# Patient Record
Sex: Female | Born: 1935 | Race: White | Hispanic: No | Smoking: Former smoker
Health system: Southern US, Community
[De-identification: ages and names within clinical notes are randomized; demographics above are authoritative.]

## PROBLEM LIST (undated history)

## (undated) DIAGNOSIS — I10 Essential (primary) hypertension: Secondary | ICD-10-CM

## (undated) DIAGNOSIS — I499 Cardiac arrhythmia, unspecified: Secondary | ICD-10-CM

## (undated) DIAGNOSIS — I639 Cerebral infarction, unspecified: Secondary | ICD-10-CM

## (undated) DIAGNOSIS — M549 Dorsalgia, unspecified: Secondary | ICD-10-CM

## (undated) DIAGNOSIS — E039 Hypothyroidism, unspecified: Secondary | ICD-10-CM

## (undated) DIAGNOSIS — I4891 Unspecified atrial fibrillation: Secondary | ICD-10-CM

## (undated) HISTORY — PX: BREAST EXCISIONAL BIOPSY: SUR124

## (undated) HISTORY — PX: CATARACT EXTRACTION W/ INTRAOCULAR LENS  IMPLANT, BILATERAL: SHX1307

## (undated) HISTORY — PX: ABDOMINAL HYSTERECTOMY: SHX81

---

## 2004-06-05 ENCOUNTER — Ambulatory Visit: Payer: Self-pay | Admitting: Internal Medicine

## 2004-07-20 ENCOUNTER — Ambulatory Visit: Payer: Self-pay | Admitting: Gastroenterology

## 2004-07-20 IMAGING — CT CT ABD-PELV W/ CM
1 of 2 series · 16 of 32 positions shown, 20 images · non-contrast
Comparison: none

REASON FOR EXAM: elevated amylase
COMMENTS:

[Series 5: abdomen inspace · axial · 0.59mm/px · z∈[-941,-542]mm · 16 of 431 slices shown, 20 images]
[im 16/431  soft-tissue]
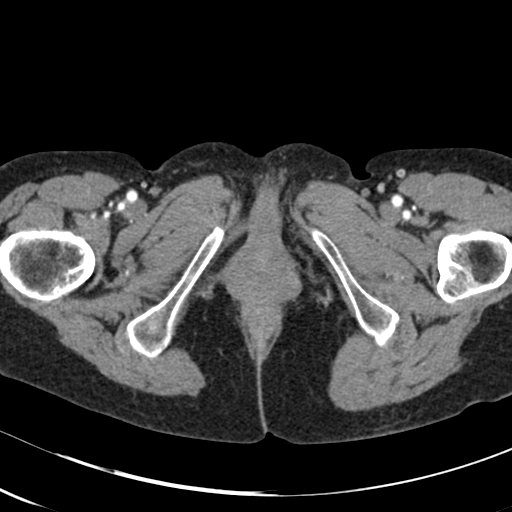
[im 16/431  bone]
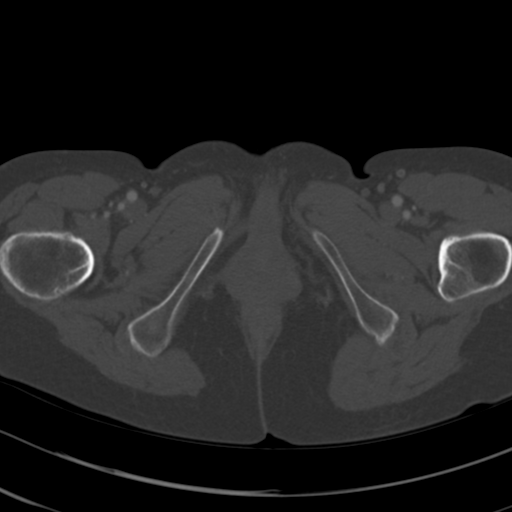
[im 47/431  soft-tissue]
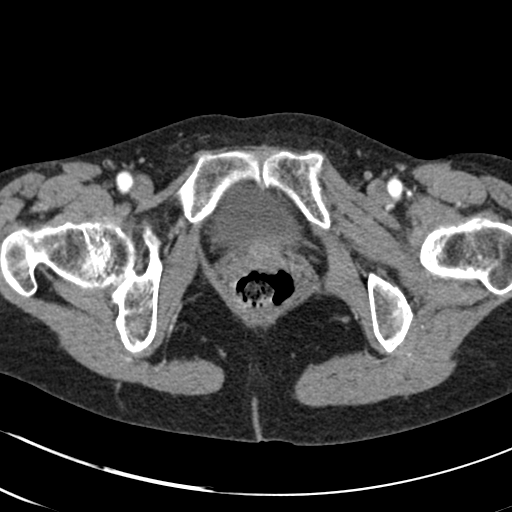
[im 77/431  soft-tissue]
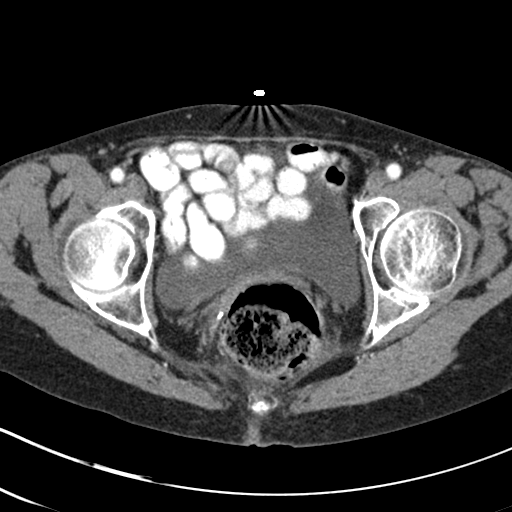
[im 108/431  soft-tissue]
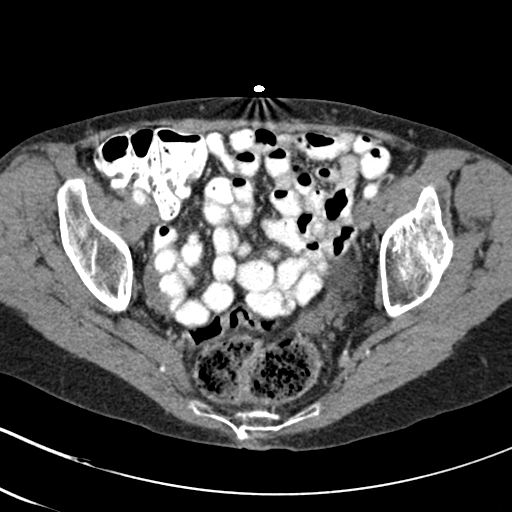
[im 139/431  soft-tissue]
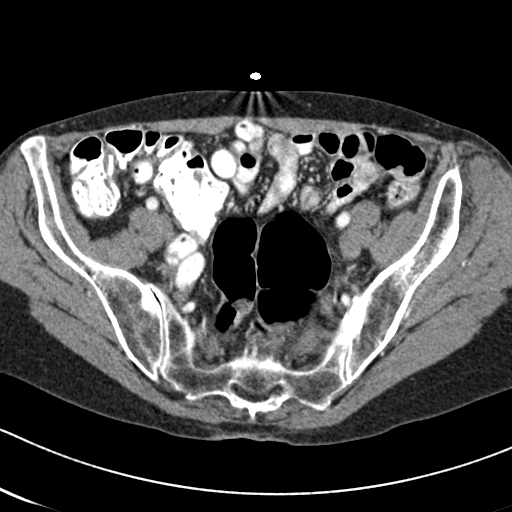
[im 169/431  soft-tissue]
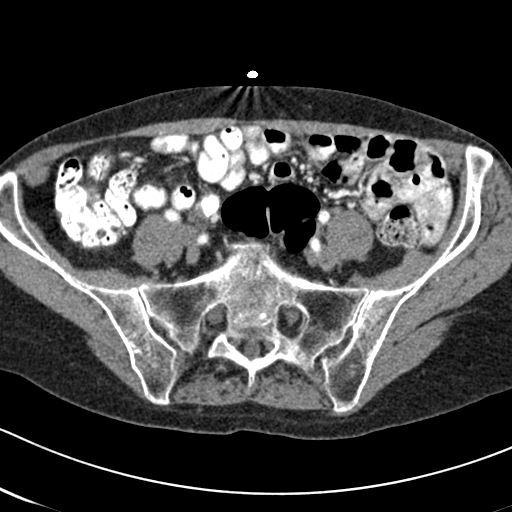
[im 200/431  soft-tissue]
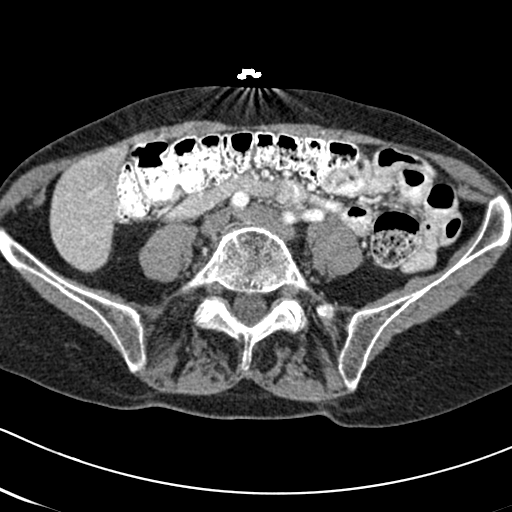
[im 231/431  soft-tissue]
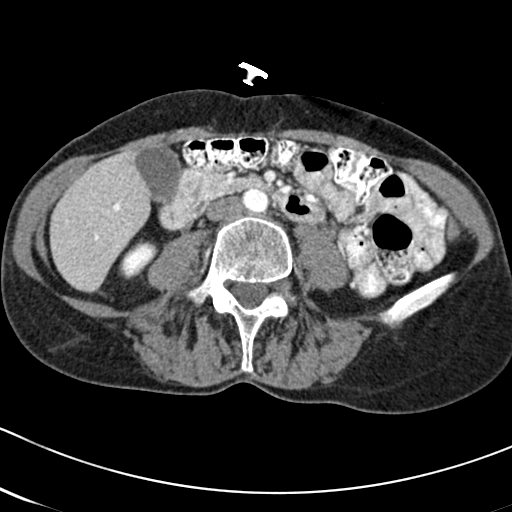
[im 262/431  soft-tissue]
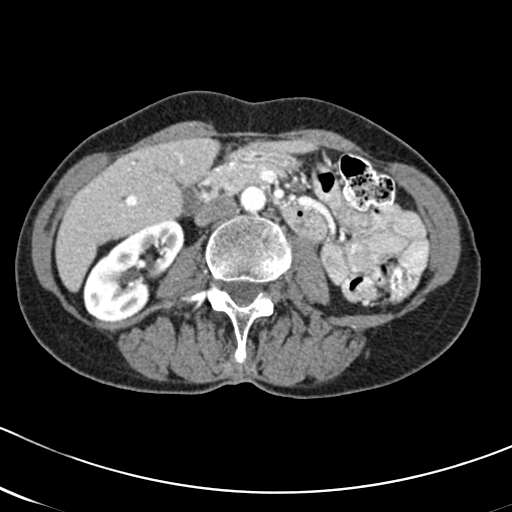
[im 262/431  bone]
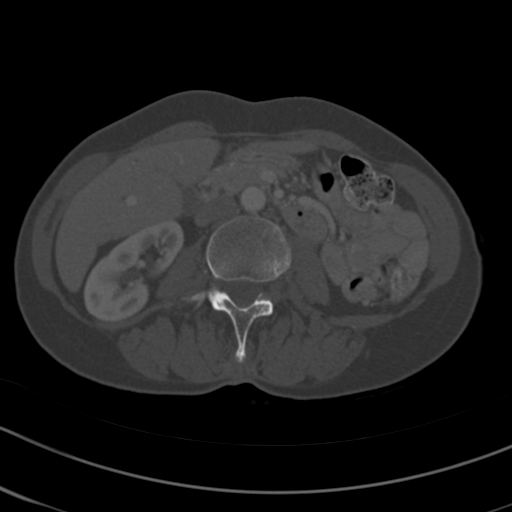
[im 292/431  soft-tissue]
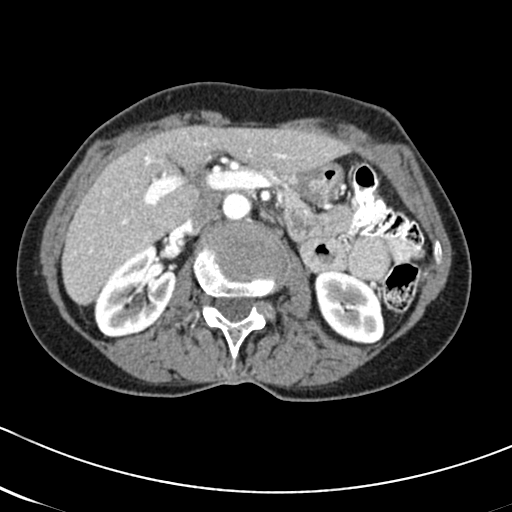
[im 323/431  soft-tissue]
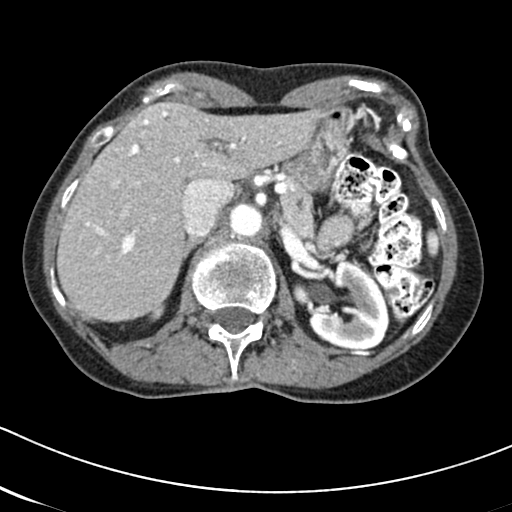
[im 354/431  soft-tissue]
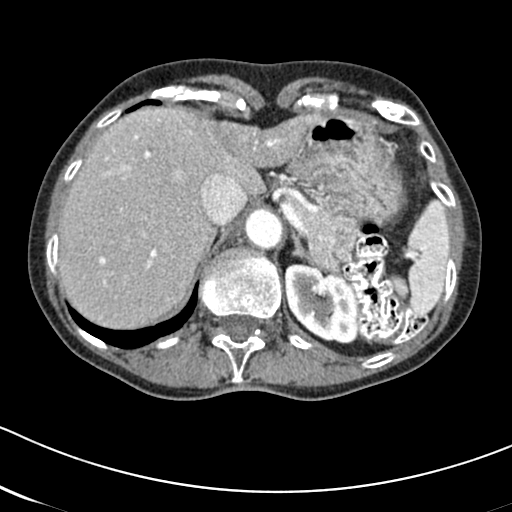
[im 369/431  lung]
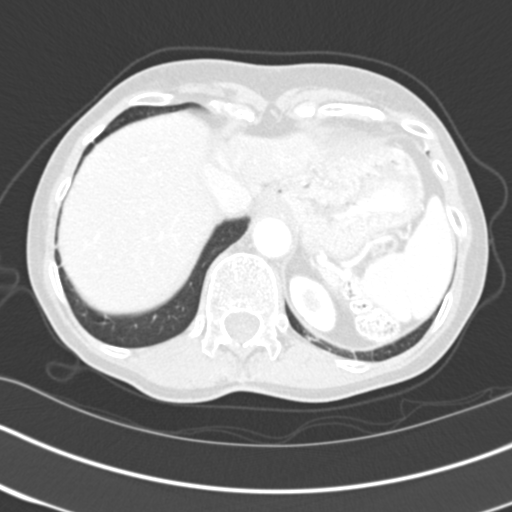
[im 384/431  soft-tissue]
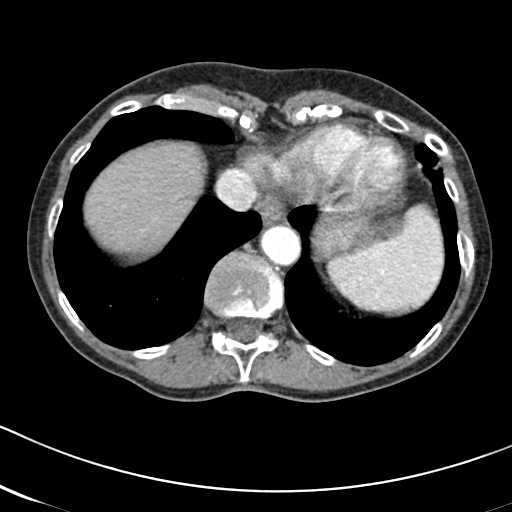
[im 384/431  lung]
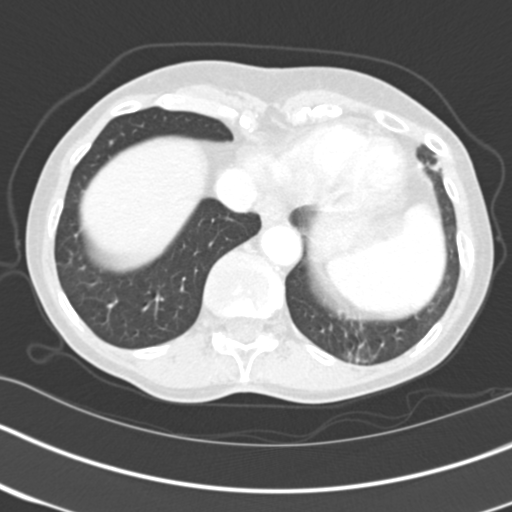
[im 400/431  lung]
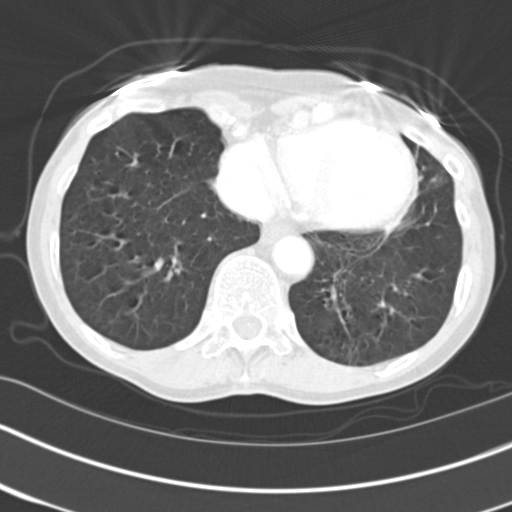
[im 415/431  soft-tissue]
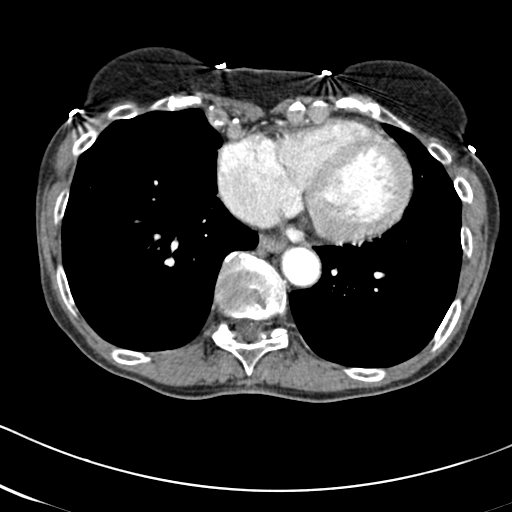
[im 415/431  lung]
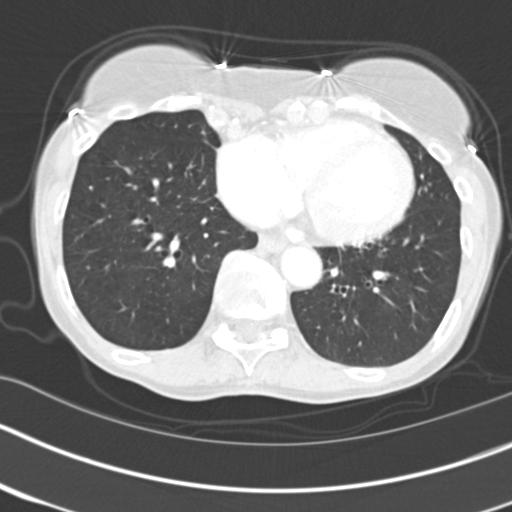

[16 of 32 positions shown; findings below may reference images not displayed]

PROCEDURE:     CT  - CT ABDOMEN / PELVIS  W  - [DATE] [DATE]

RESULT:       IV and oral contrast enhanced CT scan of the abdomen and
pelvis is performed in the standard fashion.  The patient has a prior study
dated [DATE] for comparison.  The lung bases are clear.  There appears to
be no definite pancreatic mass.  The pancreatic duct is seen but does not
appear to be significantly distended.  Sonographic correlation could be
obtained if desired.  No ascites is evident.  The urinary bladder is not
abnormally distended.  The bowel is nondistended and shows no evidence of
wall thickening or free air.  The kidneys enhance normally.  The liver,
spleen, and adrenal glands are unremarkable.  The abdominal aorta is normal
in caliber.
IMPRESSION: No definite CT evidence of pancreatitis.

## 2004-08-27 ENCOUNTER — Ambulatory Visit: Payer: Self-pay | Admitting: Gastroenterology

## 2005-07-08 ENCOUNTER — Ambulatory Visit: Payer: Self-pay | Admitting: Internal Medicine

## 2006-07-11 ENCOUNTER — Ambulatory Visit: Payer: Self-pay | Admitting: Internal Medicine

## 2007-07-14 ENCOUNTER — Ambulatory Visit: Payer: Self-pay | Admitting: Internal Medicine

## 2008-07-15 ENCOUNTER — Ambulatory Visit: Payer: Self-pay | Admitting: Internal Medicine

## 2008-12-10 ENCOUNTER — Ambulatory Visit: Payer: Self-pay | Admitting: Internal Medicine

## 2008-12-10 ENCOUNTER — Inpatient Hospital Stay: Payer: Self-pay | Admitting: Surgery

## 2008-12-10 IMAGING — US ABDOMEN ULTRASOUND
1 series · 17 of 25 positions shown · non-contrast
Comparison: none

REASON FOR EXAM: call report  [[email or web address]]  RUQ abd pain nausea vomiting
COMMENTS:

[Series 1: abdomen ultrasound · 17 of 73 slices shown]
[im 1/73]
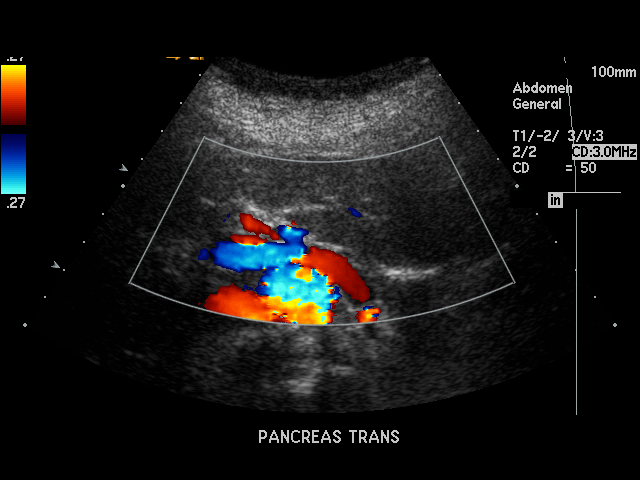
[im 7/73]
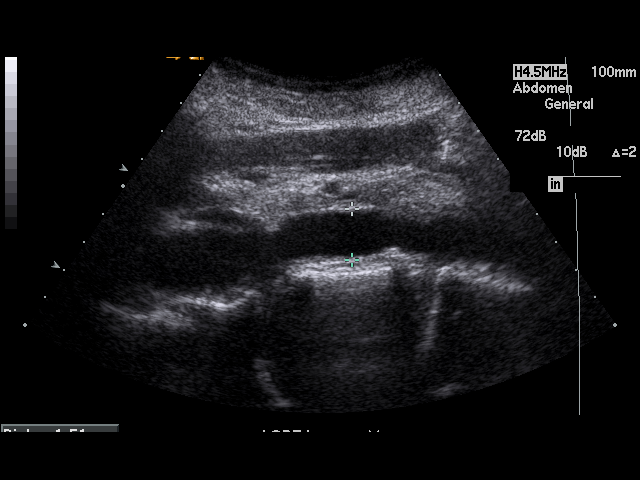
[im 10/73]
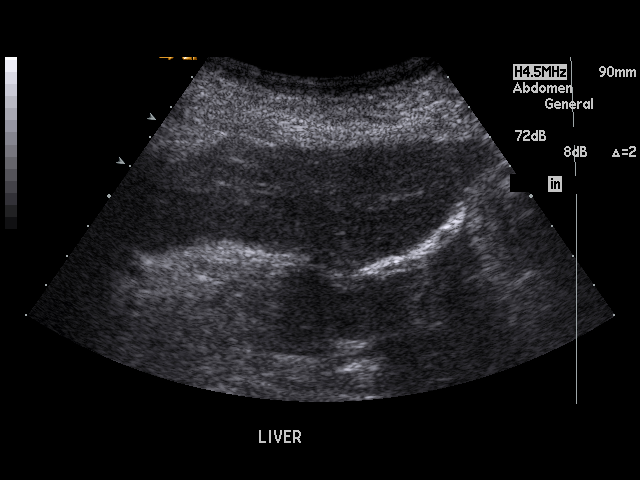
[im 16/73]
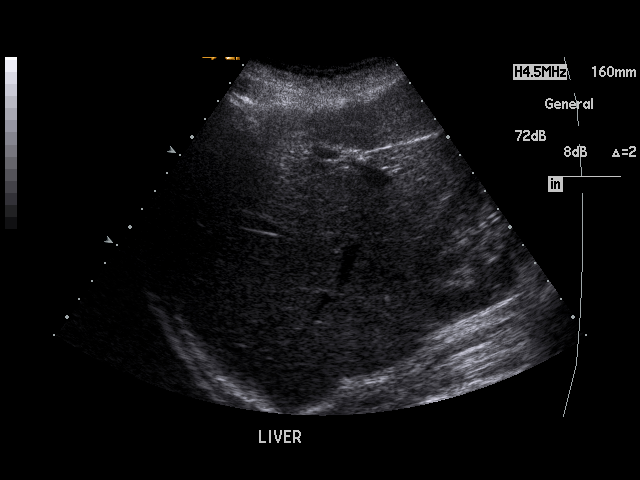
[im 19/73]
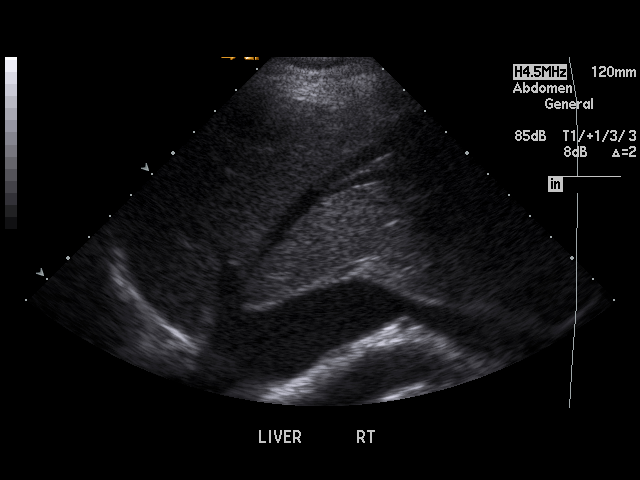
[im 25/73]
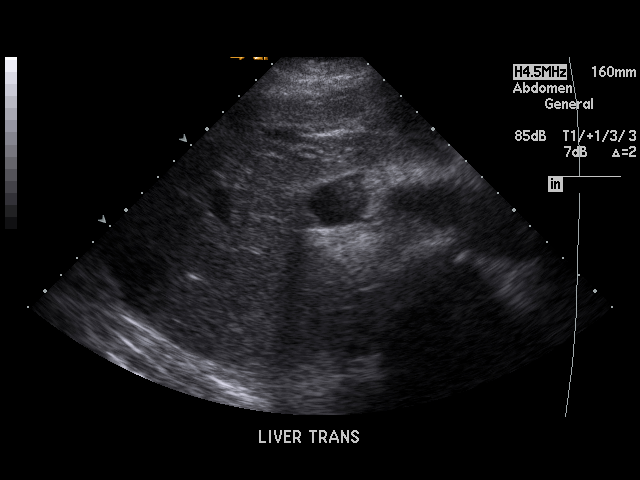
[im 28/73]
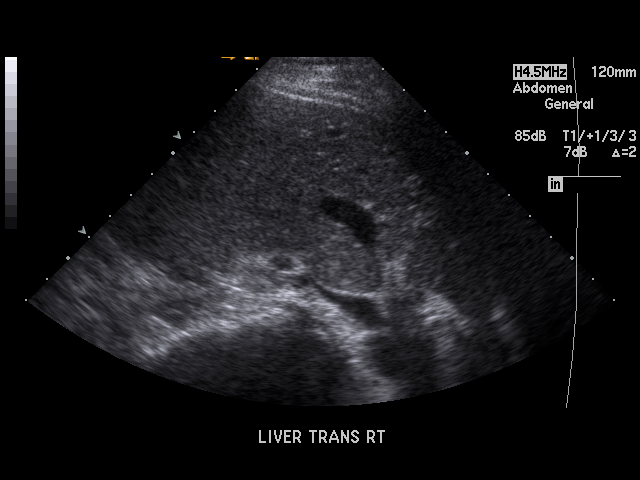
[im 34/73]
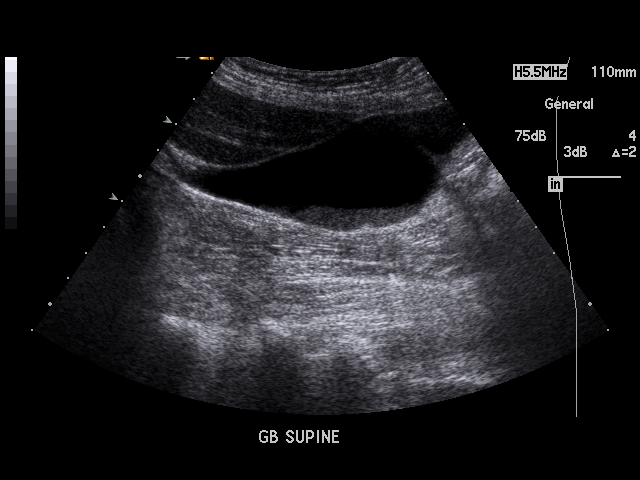
[im 37/73]
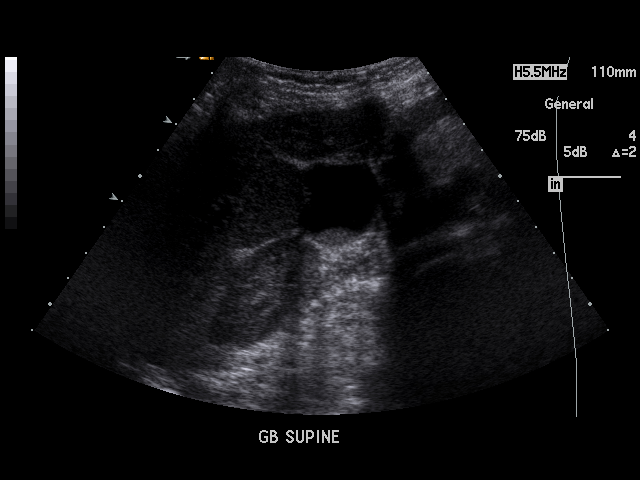
[im 40/73]
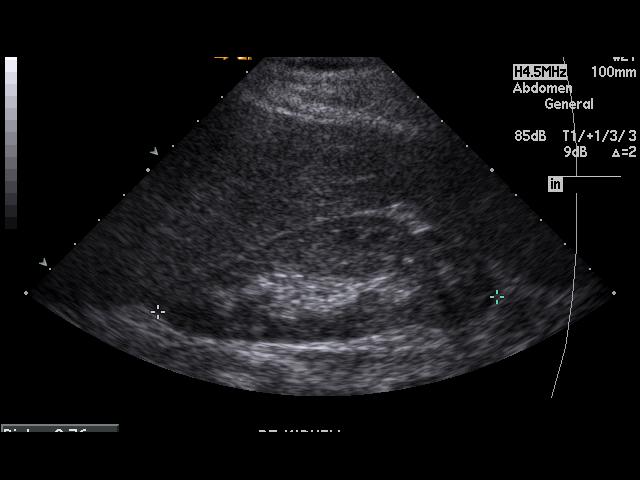
[im 46/73]
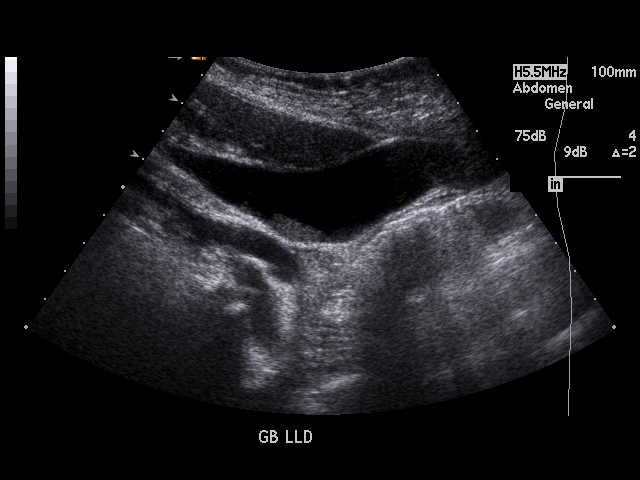
[im 49/73]
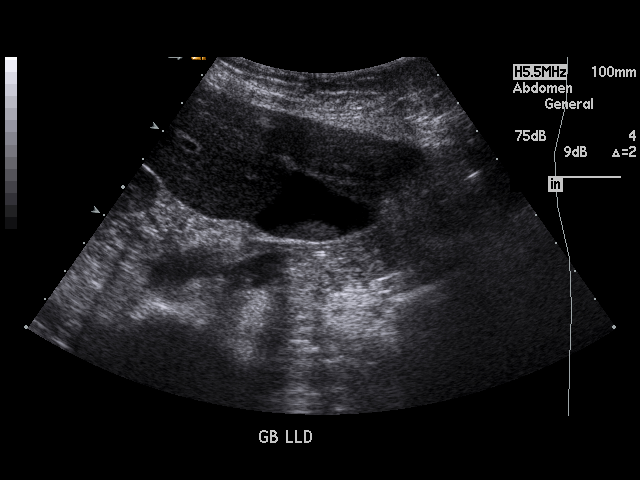
[im 55/73]
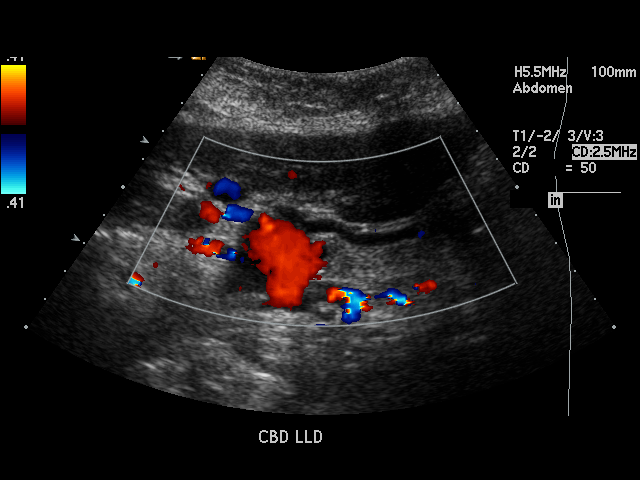
[im 58/73]
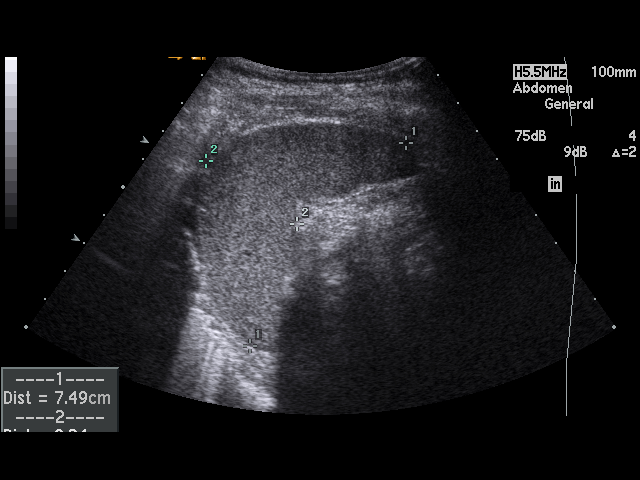
[im 64/73]
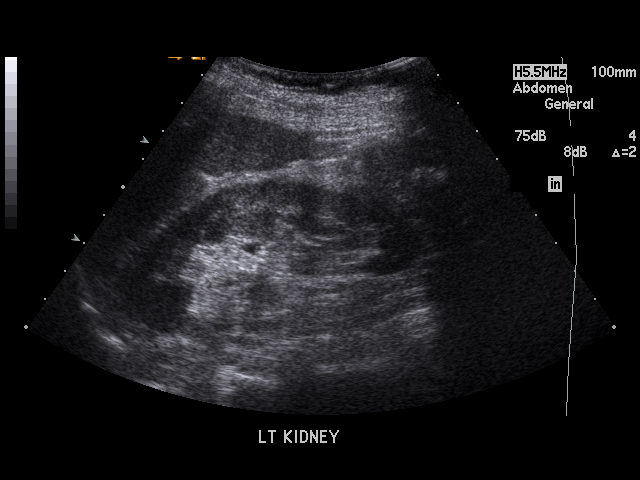
[im 67/73]
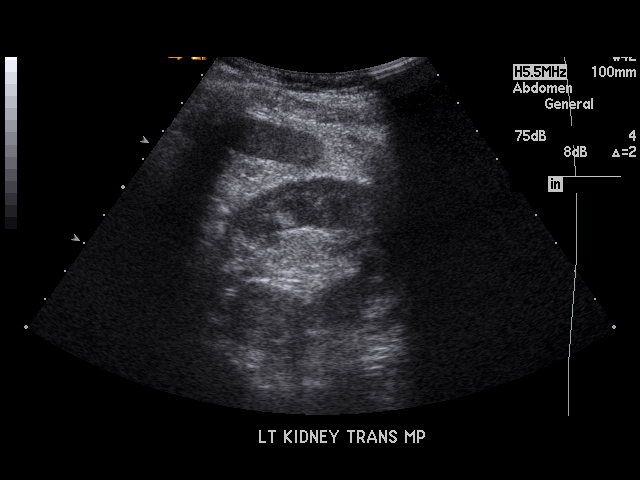
[im 73/73]
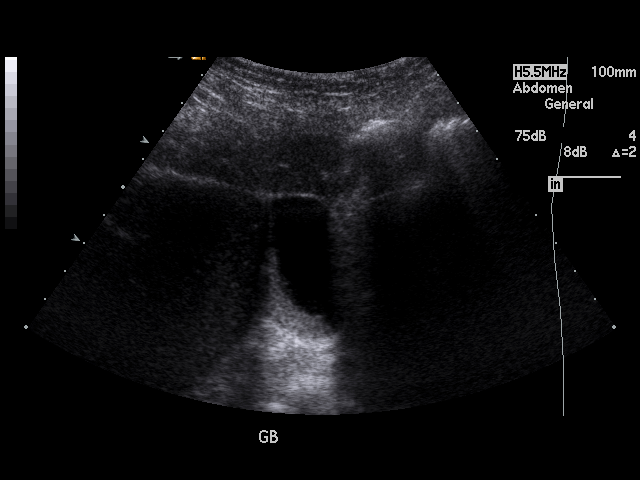

[17 of 25 positions shown; findings below may reference images not displayed]

PROCEDURE:     US  - US ABDOMEN GENERAL SURVEY  - [DATE]  [DATE]

RESULT:     The liver exhibits normal echotexture with no focal mass nor
ductal dilation. The gallbladder is adequately distended and contains
echogenic layering sludge. There is no sonographic Murphy's sign nor
evidence of gallbladder wall thickening. The common bile duct measures
mm in diameter.

Limited evaluation of the pancreas due to bowel gas revealed no suspicious
findings. The spleen is normal in size. The abdominal aorta is at the upper
limits of normal for caliber at 2.4 cm. The kidneys are normal in size and
echotexture.
IMPRESSION: 1. There is sludge within the gallbladder. I see no discrete stones and
there are no ultrasonic findings to suggest acute cholecystitis.
2. There is borderline dilation of the caliber of the abdominal aorta is
cm.
3. I see no acute abnormality elsewhere within the abdomen.

## 2008-12-10 IMAGING — CR DG ABDOMEN 3V
1 series · 4 of 4 positions shown · non-contrast
Comparison: none

REASON FOR EXAM: Abdominal pain
COMMENTS:

[Series 1: view not recorded · 0.17mm/px · 4 of 4 slices shown]
[im 1/4]
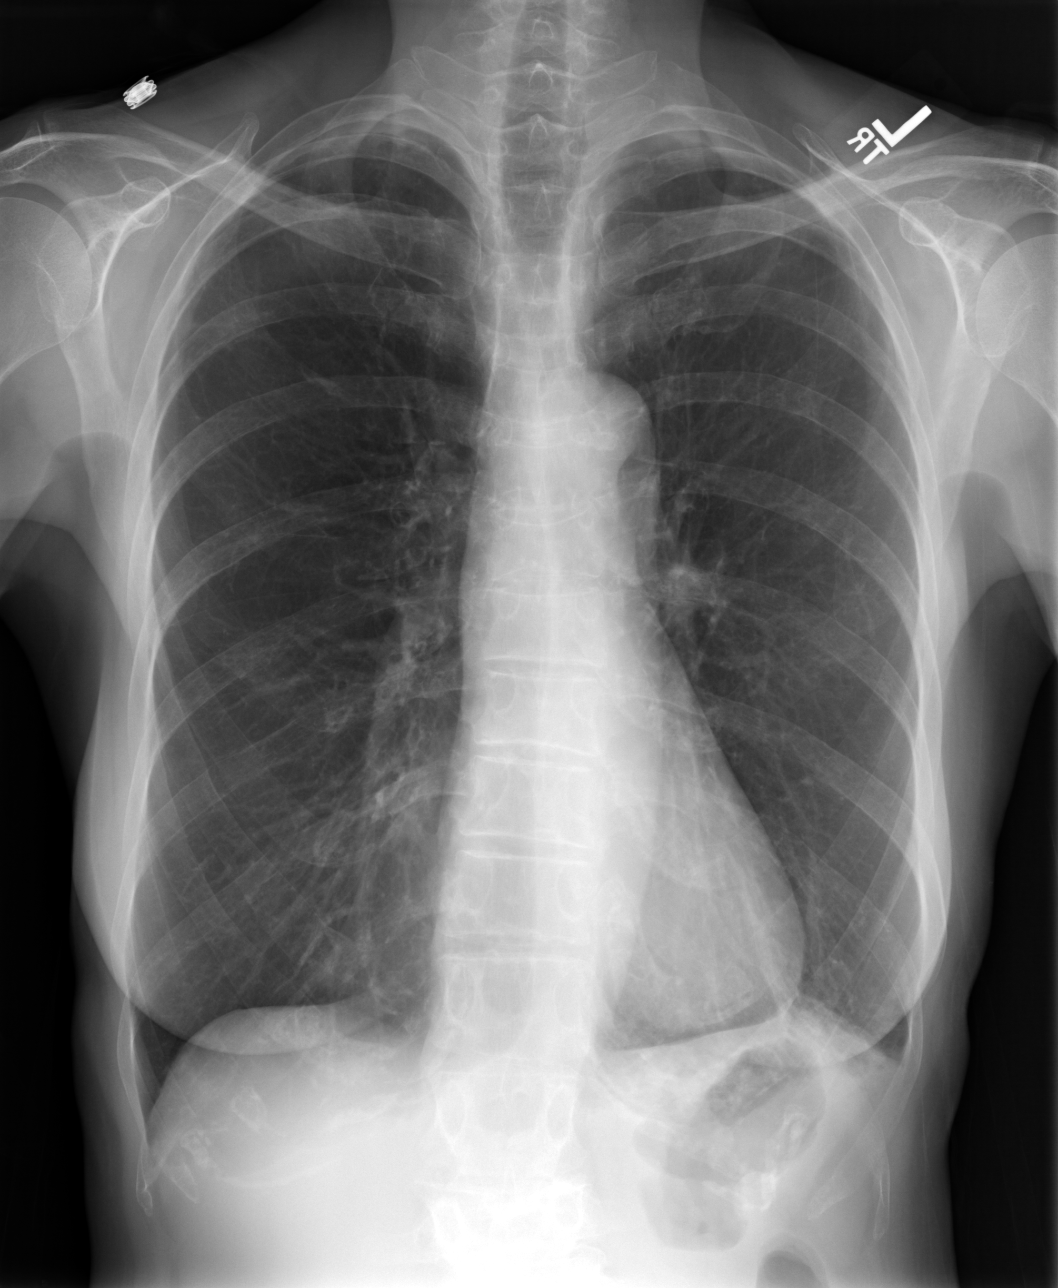
[im 2/4]
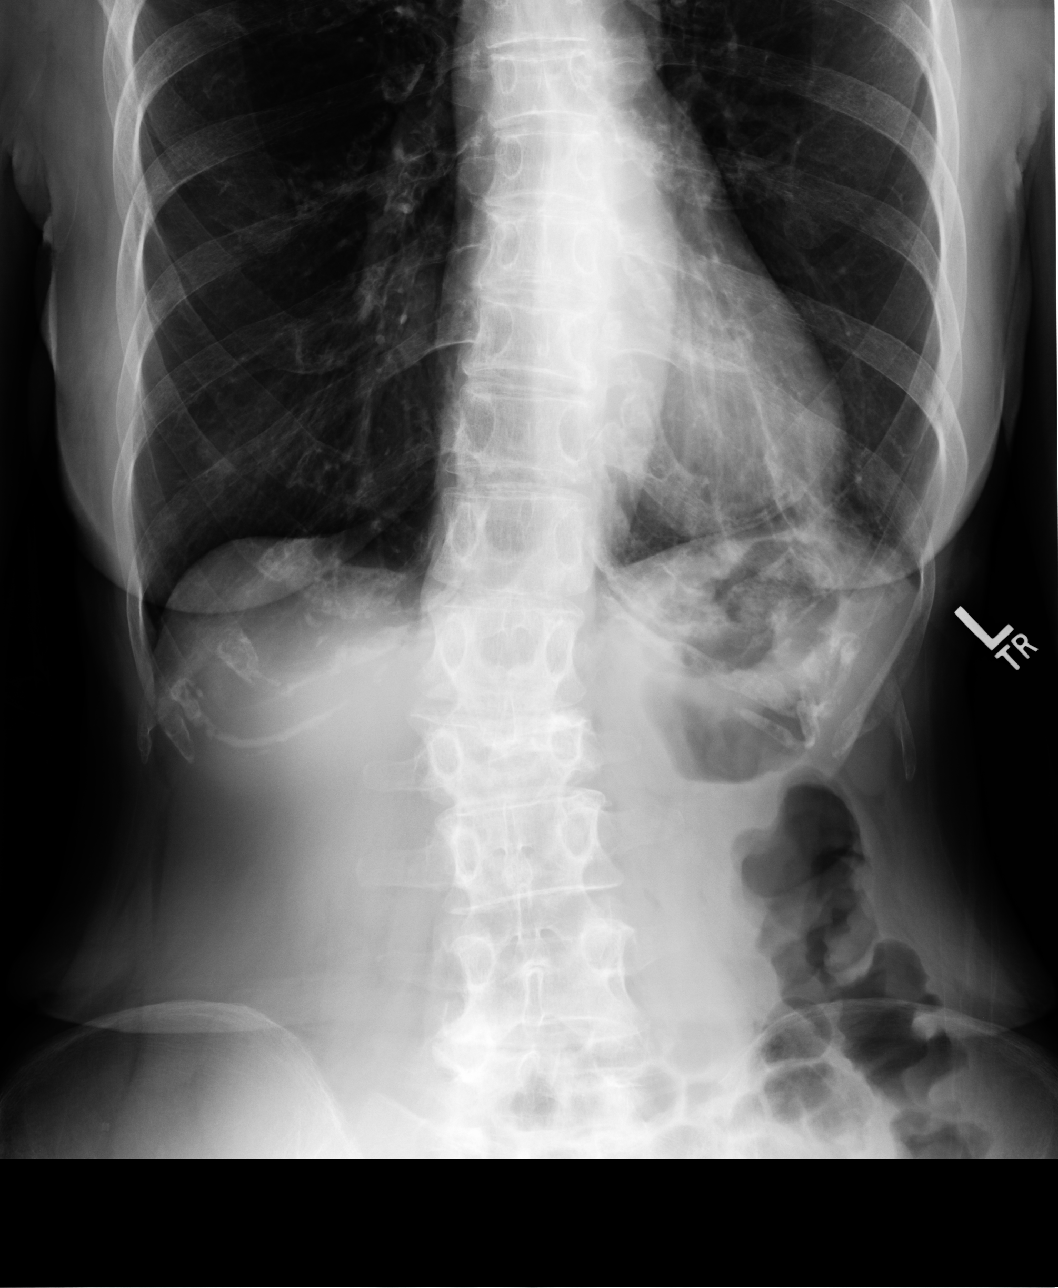
[im 3/4]
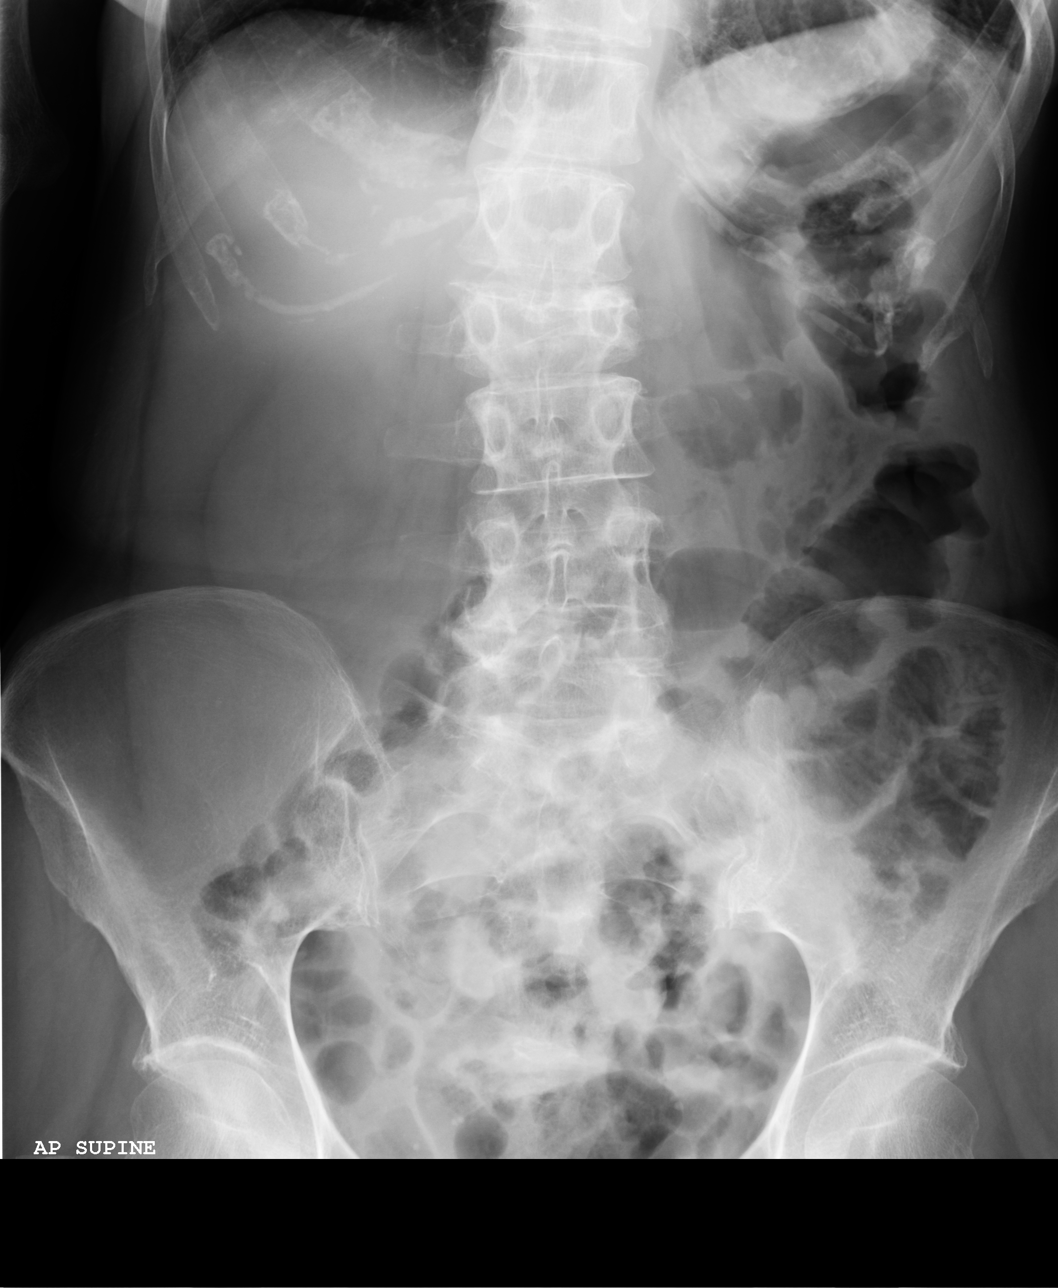
[im 4/4]
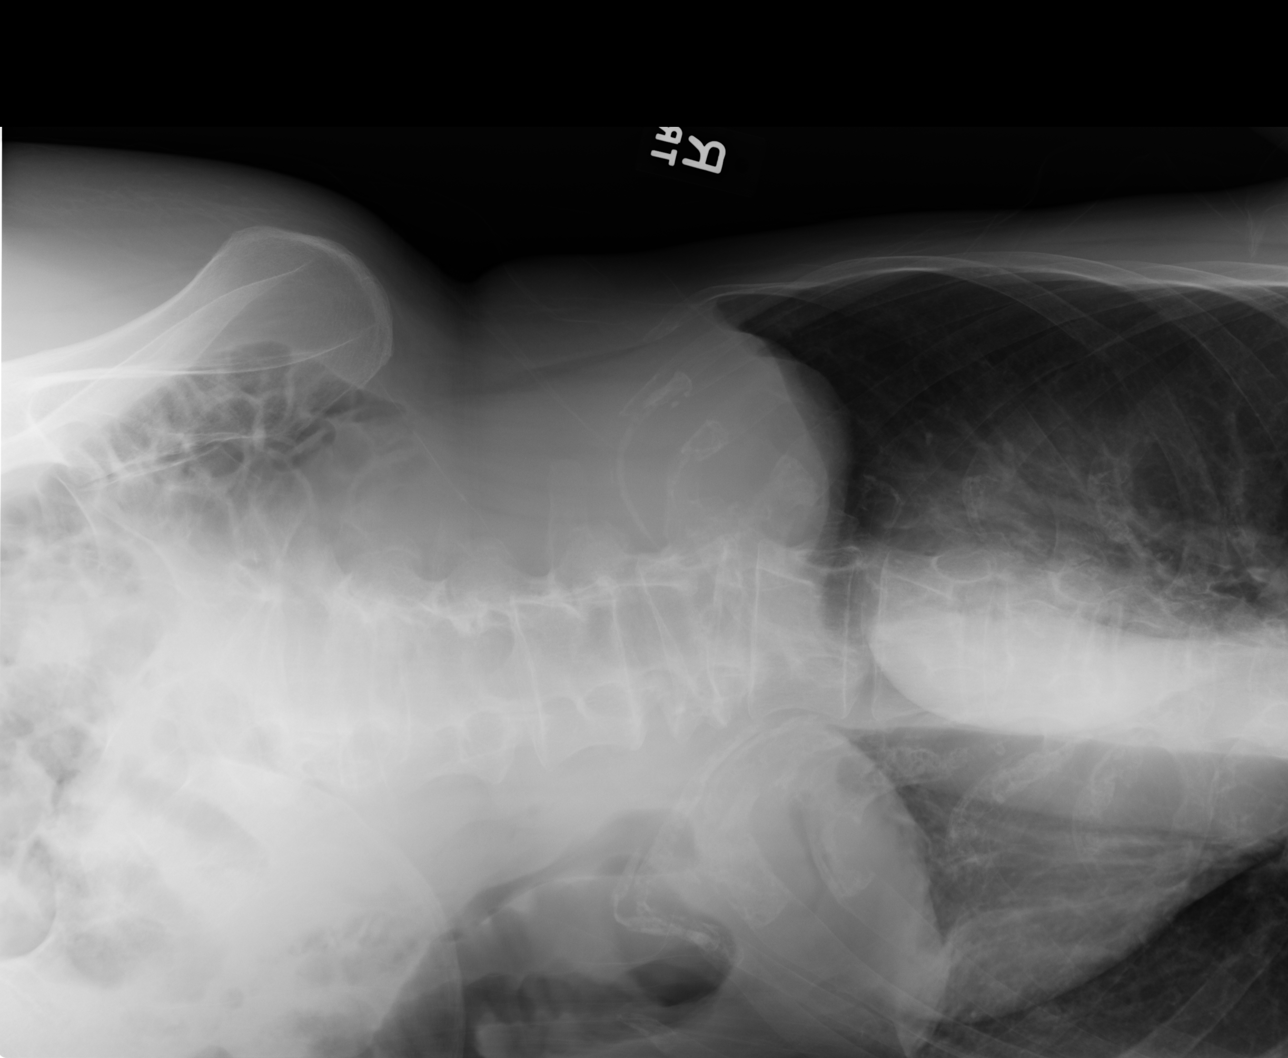

[4 of 4 positions shown; findings below may reference images not displayed]

PROCEDURE:     DXR - DXR ABDOMEN 3-WAY (INCL PA CXR)  - [DATE]  [DATE]

RESULT:     Single frontal view of the chest demonstrates hyperinflation
without focal regions of consolidation or focal infiltrates.

Air is seen within nondilated loops of large and small bowel. S-shaped
scoliosis is identified within the thoracolumbar spine. Compression
deformity is appreciated involving L2 which appears to be on the magnitude
of 50-55%. Air is seen within nondilated loops of large and small bowel.
IMPRESSION: 1.     COPD.
2.     Nonobstructive bowel gas pattern.
3.     S-shaped scoliosis and compression deformity involving L2.

## 2008-12-11 IMAGING — CT CT CHEST-ABD-PELV W/ CM
2 of 3 series · 12 of 32 positions shown, 17 images · IV contrast (APPLIED)
Comparison: none

REASON FOR EXAM: rt chest pain,abd pain,diarrhea.
COMMENTS:

[Series 5: abdomen · axial · 0.71mm/px · z∈[-438,-123]mm · 7 of 85 slices shown, 12 images]
[im 11/85  soft-tissue]
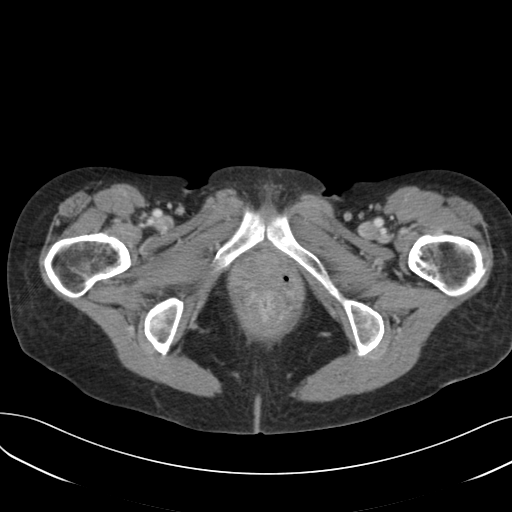
[im 11/85  bone]
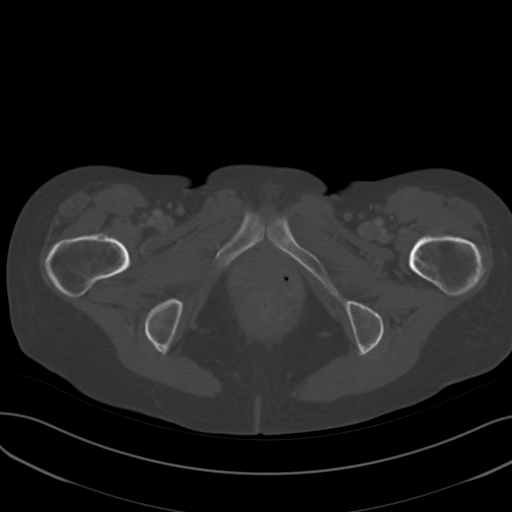
[im 22/85  soft-tissue]
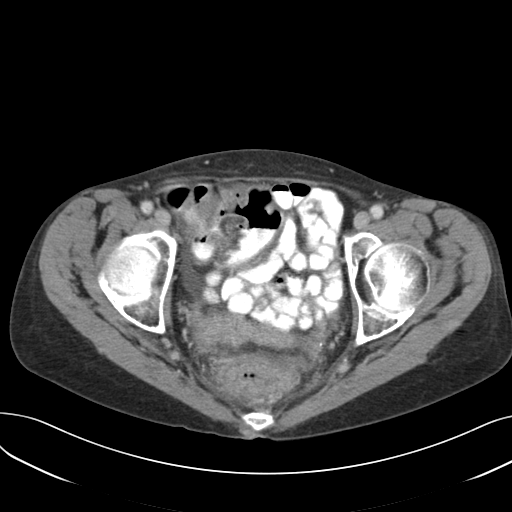
[im 32/85  soft-tissue]
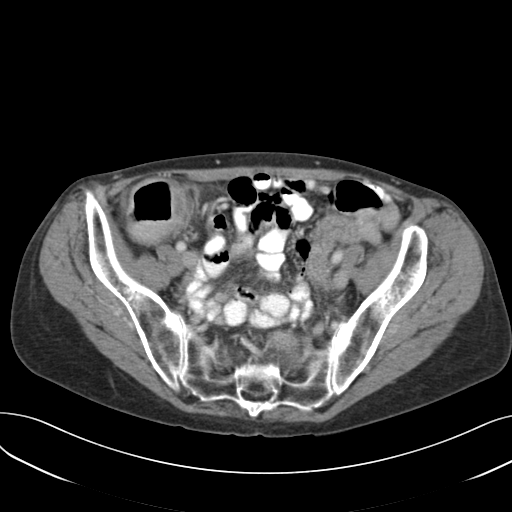
[im 43/85  soft-tissue]
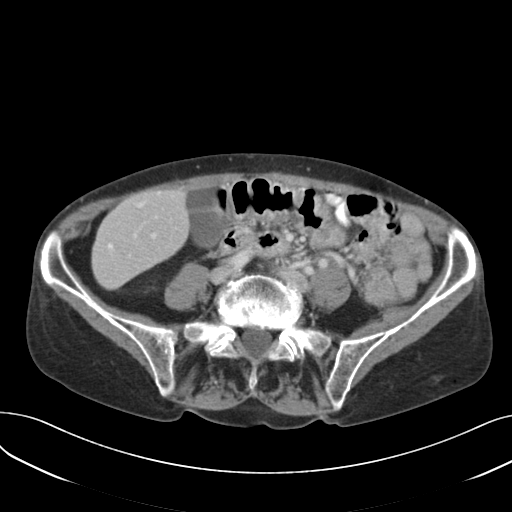
[im 43/85  lung]
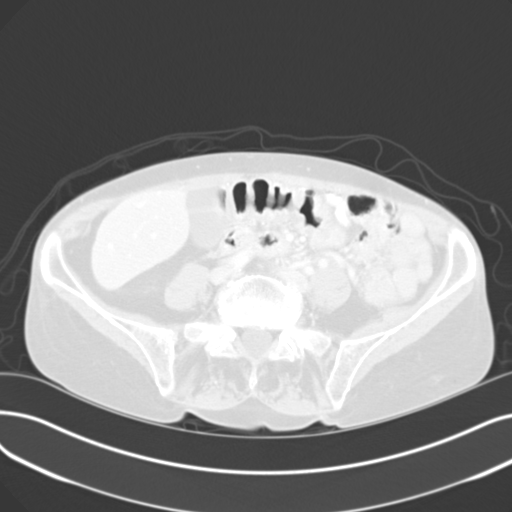
[im 53/85  soft-tissue]
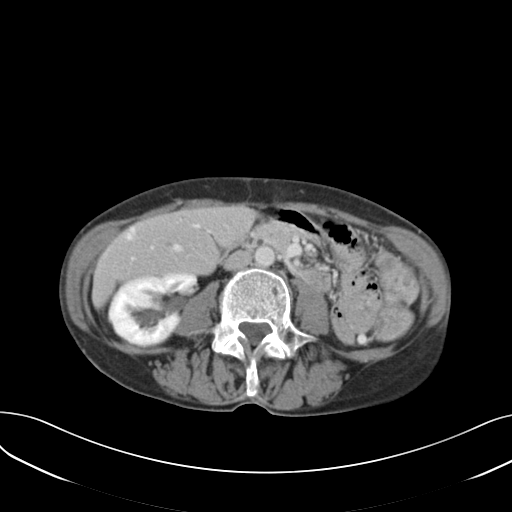
[im 53/85  lung]
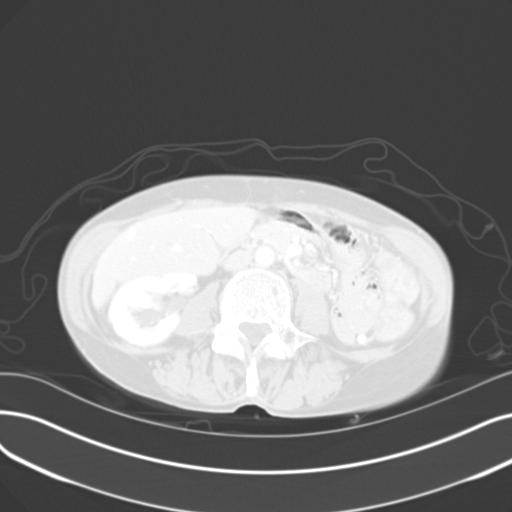
[im 64/85  soft-tissue]
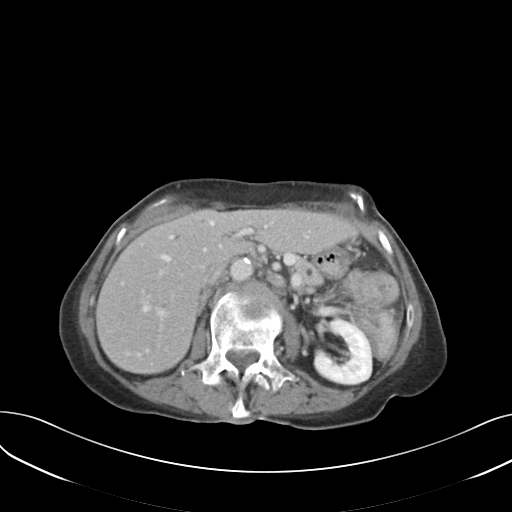
[im 64/85  lung]
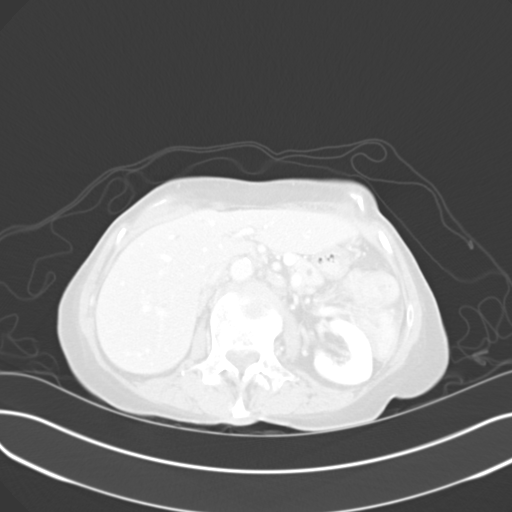
[im 74/85  soft-tissue]
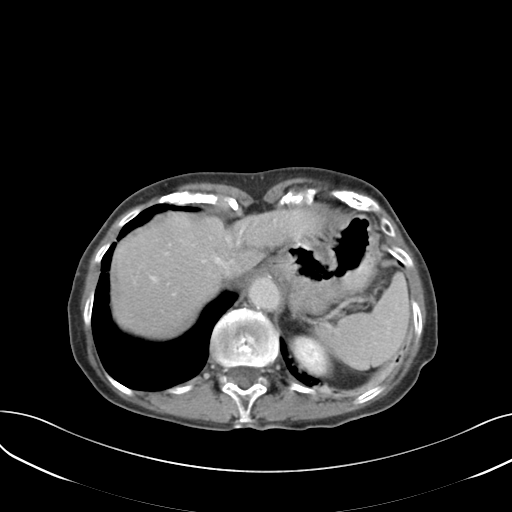
[im 74/85  lung]
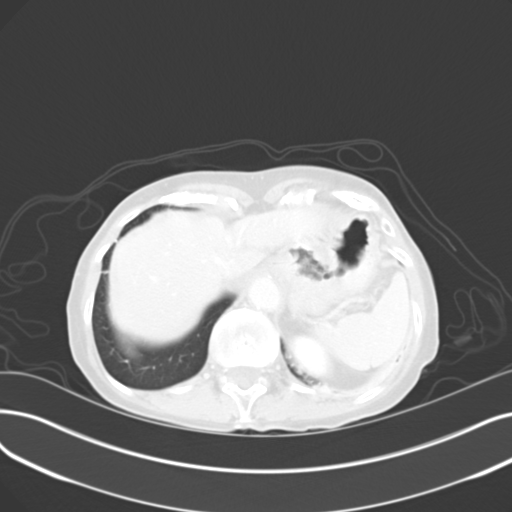

[Series 10: abdomen delay · axial · delayed · 0.71mm/px · z∈[-438,-228]mm · 5 of 85 slices shown]
[im 11/85  soft-tissue]
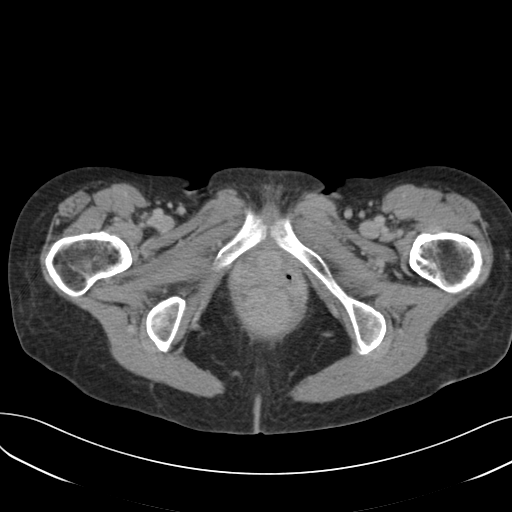
[im 22/85  soft-tissue]
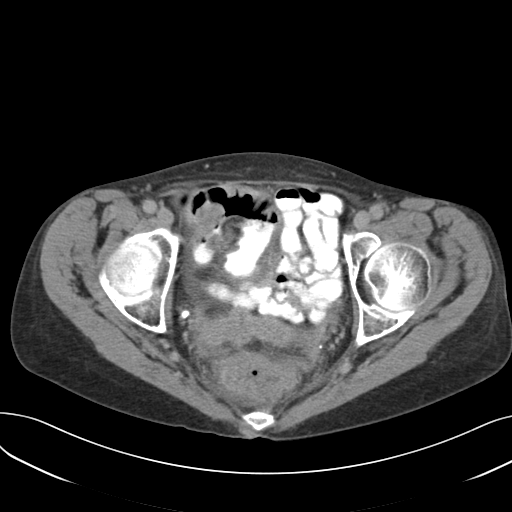
[im 32/85  soft-tissue]
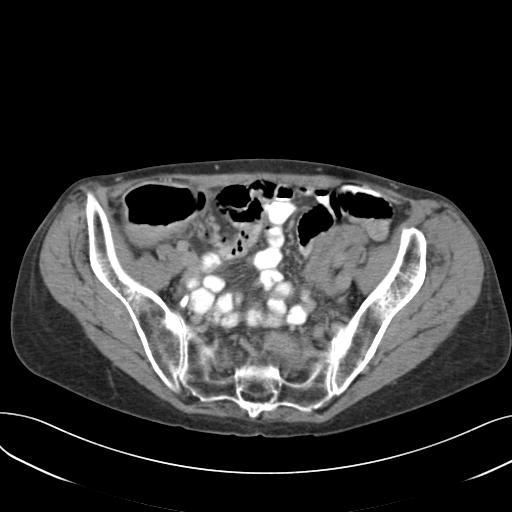
[im 43/85  soft-tissue]
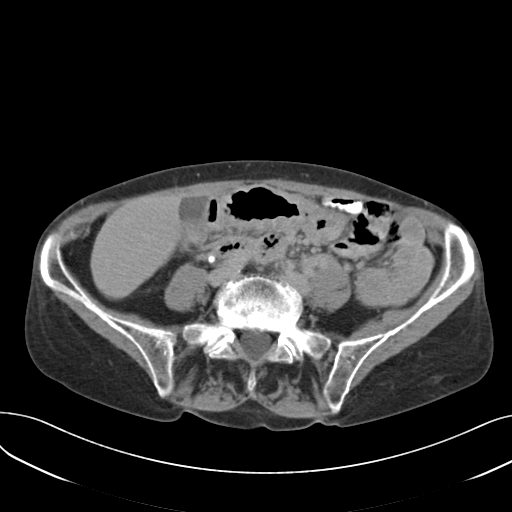
[im 53/85  soft-tissue]
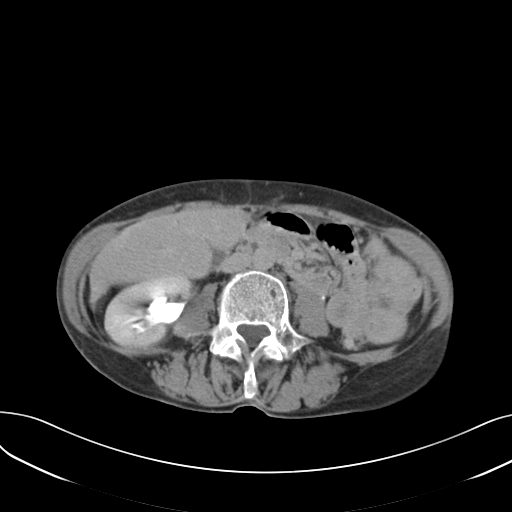

[12 of 32 positions shown; findings below may reference images not displayed]

PROCEDURE:     CT  - CT PE CHEST/ ABD/PELVIS WITH  - [DATE]  [DATE]

RESULT:     Axial CT scanning was performed through the chest at 3 mm
intervals and slice thicknesses following administration of 100 cc of
[7F]. Subsequently CT scanning was performed through the abdomen and
pelvis. The patient received the previously administered IV contrast for the
abdominal pelvic CT as well as oral contrast. Review of 3-dimensional
reconstructed images was performed separately on the [employer] monitor.

CT scan of the chest: Contrast within the pulmonary arterial tree is normal
in appearance. I do not see findings suspicious for an acute pulmonary
embolism. The caliber of the thoracic aorta is normal. The cardiac chambers
are normal in size. I see no pathologic sized mediastinal or hilar lymph
nodes. There is a dominant area of nodularity at the base of the neck on the
right that may be arising from the lateral aspect of the right thyroid lobe
and represent an approximate lady 2.1 cm diameter nodule. Followup
ultrasound of the thyroid and neck base is recommended. This appears to be
medial to the right distal internal jugular vein though I cannot exclude
this reflecting the vein itself with some mural enhancement around clot. At
lung window settings there are emphysematous changes bilaterally. There is
atelectasis versus early infiltrate in the costophrenic gutter on the left.
The thoracic vertebral bodies are preserved in height.
CONCLUSION: 1. I do not see evidence of an acute pulmonary embolism.
2. There is an abnormal appearing structure the base of the neck
demonstrated from the entry image number 1 to
 image 10. This may reflect a rim calcified right thyroid nodule but the
possibility of this reflecting thrombus within the distal internal jugular
vein on the right is raised.
3. There are changes of COPD and there is left lower lobe atelectasis versus
developing infiltrate. There is likely bronchiectasis in the left lower lobe
as well.

CT scan of the abdomen and pelvis:

 The liver exhibits no focal mass. There is a trace of intrahepatic ductal
dilation. The gallbladder is mildly distended but I see no calcified stones
nor wall thickening. The pancreas exhibits no focal mass nor ductal
dilation. The kidneys exhibit no evidence of obstruction. I see no
parenchymal masses. The adrenal glands and spleen are normal in appearance.
The caliber of the abdominal aorta is normal. The orally administered
contrast has traversed much of the small bowel but has not reached the
colon. The colon exhibits a right relatively nonspecific stool and gas
pattern. Descending colon and sigmoid are nondistended. I do not see
objective evidence of acute diverticulitis but wall thickness cannot be
adequately assessed. I do not see evidence of an intra-abdominal nor pelvic
abscess. There may be a trace of free fluid in the pelvis. The uterus is
apparently surgically absent. The urinary bladder is only partially
distended and is grossly normal in appearance. There is a high-grade
compression of the body of L2. Mild degenerative disc changes at L4-L5 and
L5-S1 are demonstrated. At the L2 level mild retropulsion of the posterior
superior aspect of the vertebral body produces mild narrowing of the spinal
canal.
IMPRESSION: 1. Please see the discussion above regarding findings in the thorax.
2. Within the abdomen the colon is not contrast filled. The ascending and
proximal transverse colons contain fluid and gas. There is a suggestion of
mural thickening in the transverse colon especially distally. This may be
related to the clinically known C. difficile colitis. At approximately the
level of the splenic flexure the caliber of the colon decreases with very
little stool or gas visible from there are to the rectum. I see no discrete
abscess or free fluid nor evidence of obstruction.
3. There is a trace of intrahepatic ductal dilation. I do not see evidence
of gallstones. No acute pancreatic abnormality is demonstrated.
4. I do not see evidence of obstruction of the urinary tracts nor evidence
of renal parenchymal masses.
5. There is a compression of the body of L2 with mild retropulsion of bone
producing mild narrowing of the bony spinal canal.

## 2009-01-07 ENCOUNTER — Inpatient Hospital Stay: Payer: Self-pay | Admitting: Unknown Physician Specialty

## 2009-01-07 IMAGING — CR DG ABDOMEN 2V
1 series · 2 of 2 positions shown · non-contrast
Comparison: none

REASON FOR EXAM: abd. pain, suspected reoccurance of c-diff colitis
COMMENTS:

PROCEDURE:     DXR - DXR ABDOMEN 2 V FLAT AND ERECT  - [DATE]  [DATE]
RESULT:     Comparisons:  [DATE]

[Series 1: view not recorded · 0.17mm/px · 2 of 2 slices shown]
[im 1/2]
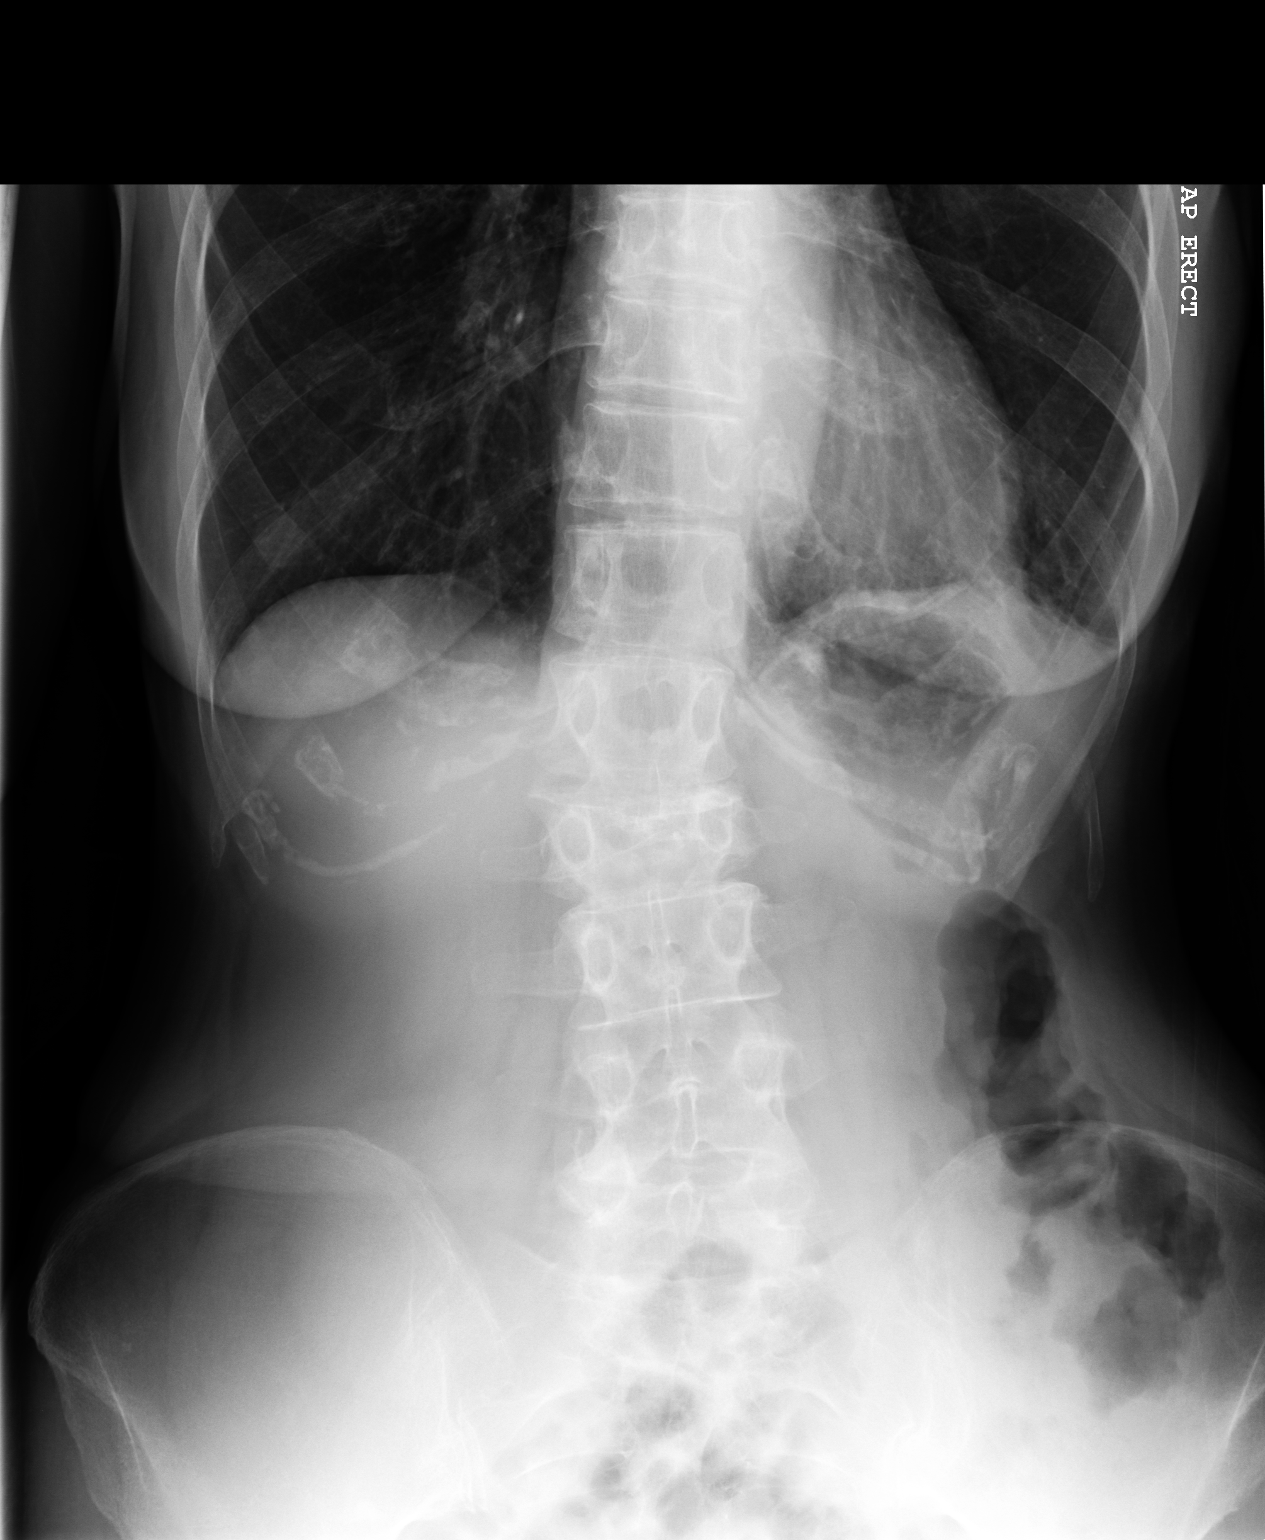
[im 2/2]
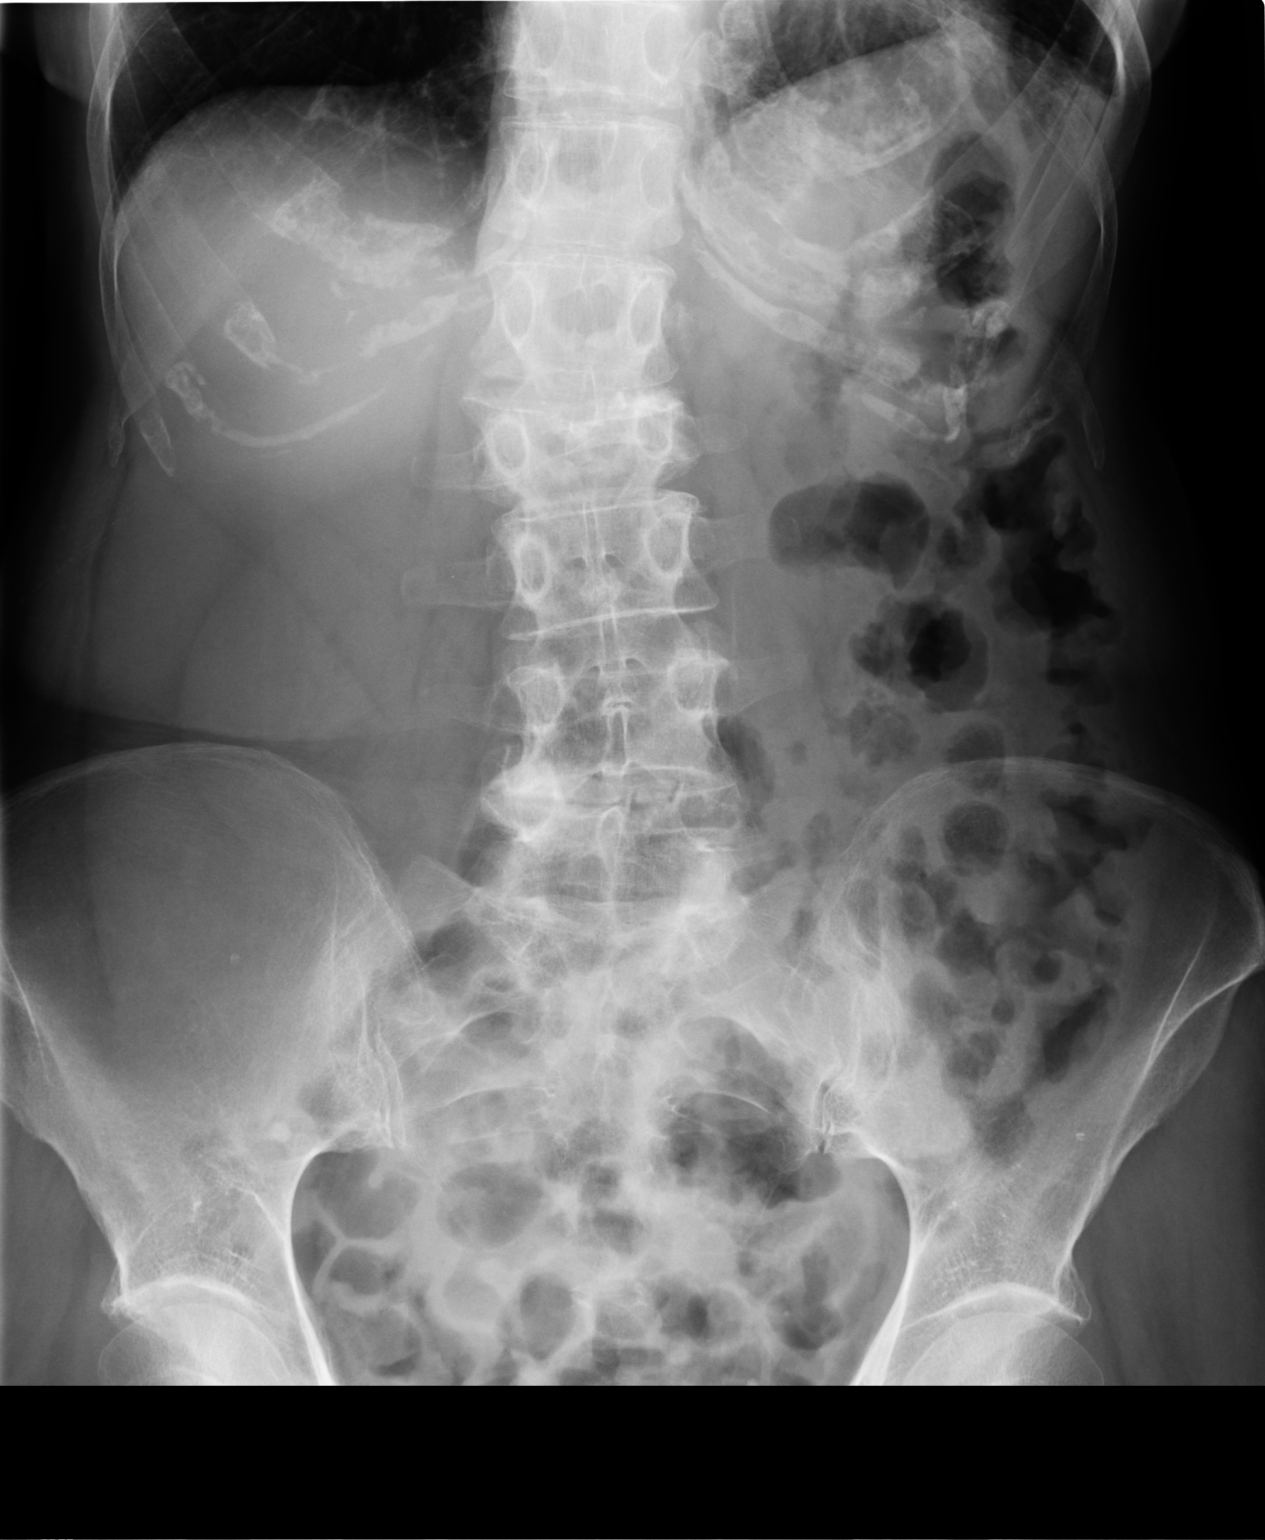

[2 of 2 positions shown; findings below may reference images not displayed]

FINDINGS: Supine and upright views of the abdomen are provided.

There is a nonspecific bowel gas pattern. There is no bowel dilatation to
suggest obstruction. There are no air-fluid levels. There is no pathologic
calcification along the expected course of the ureters. There is no evidence
of pneumoperitoneum, portal venous gas, or pneumatosis. There is
thumbprinting of the descending colon which can be seen with bowel wall
thickening secondary to colitis.

The osseous structures are unremarkable.
IMPRESSION: There is thumbprinting of the descending colon which can be seen with bowel
wall thickening secondary to colitis.

## 2009-01-10 IMAGING — CR DG ABDOMEN 2V
1 series · 3 of 3 positions shown · non-contrast
Comparison: none

REASON FOR EXAM: follow up abnormal x ray
COMMENTS:

[Series 1: view not recorded · 0.17mm/px · 3 of 3 slices shown]
[im 1/3]
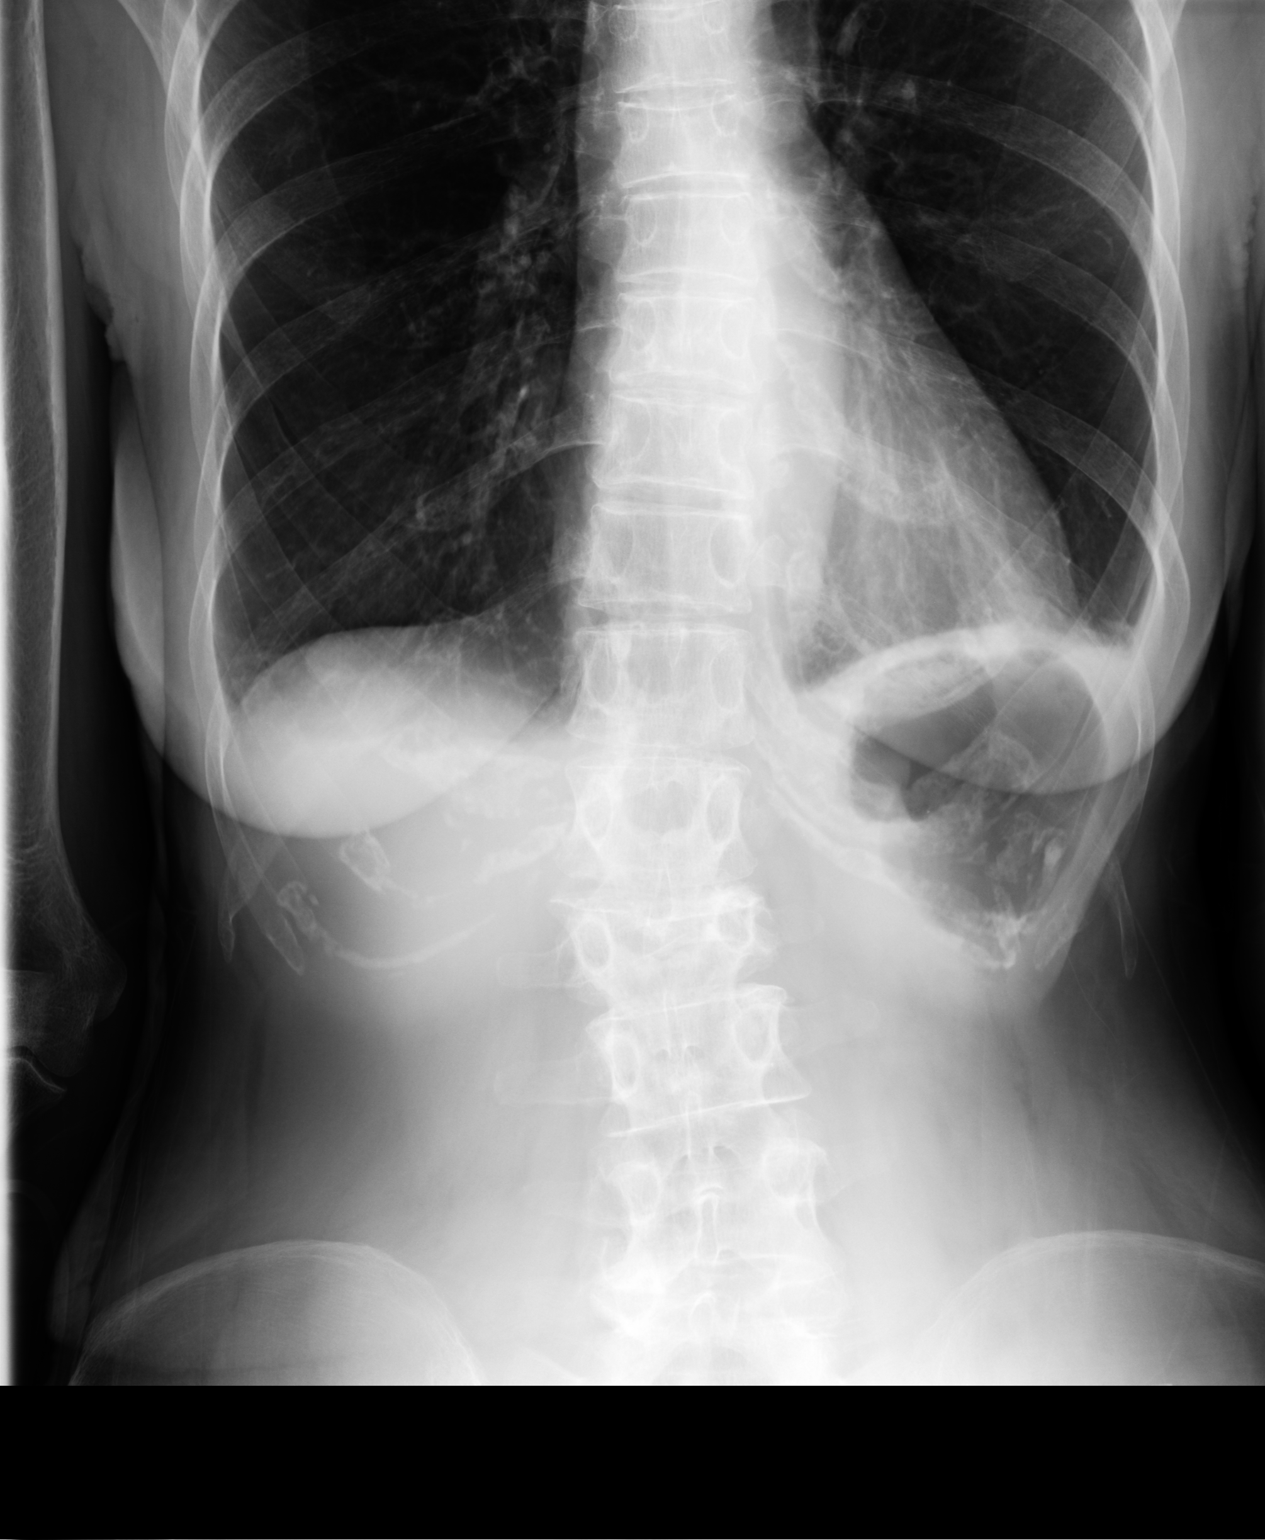
[im 2/3]
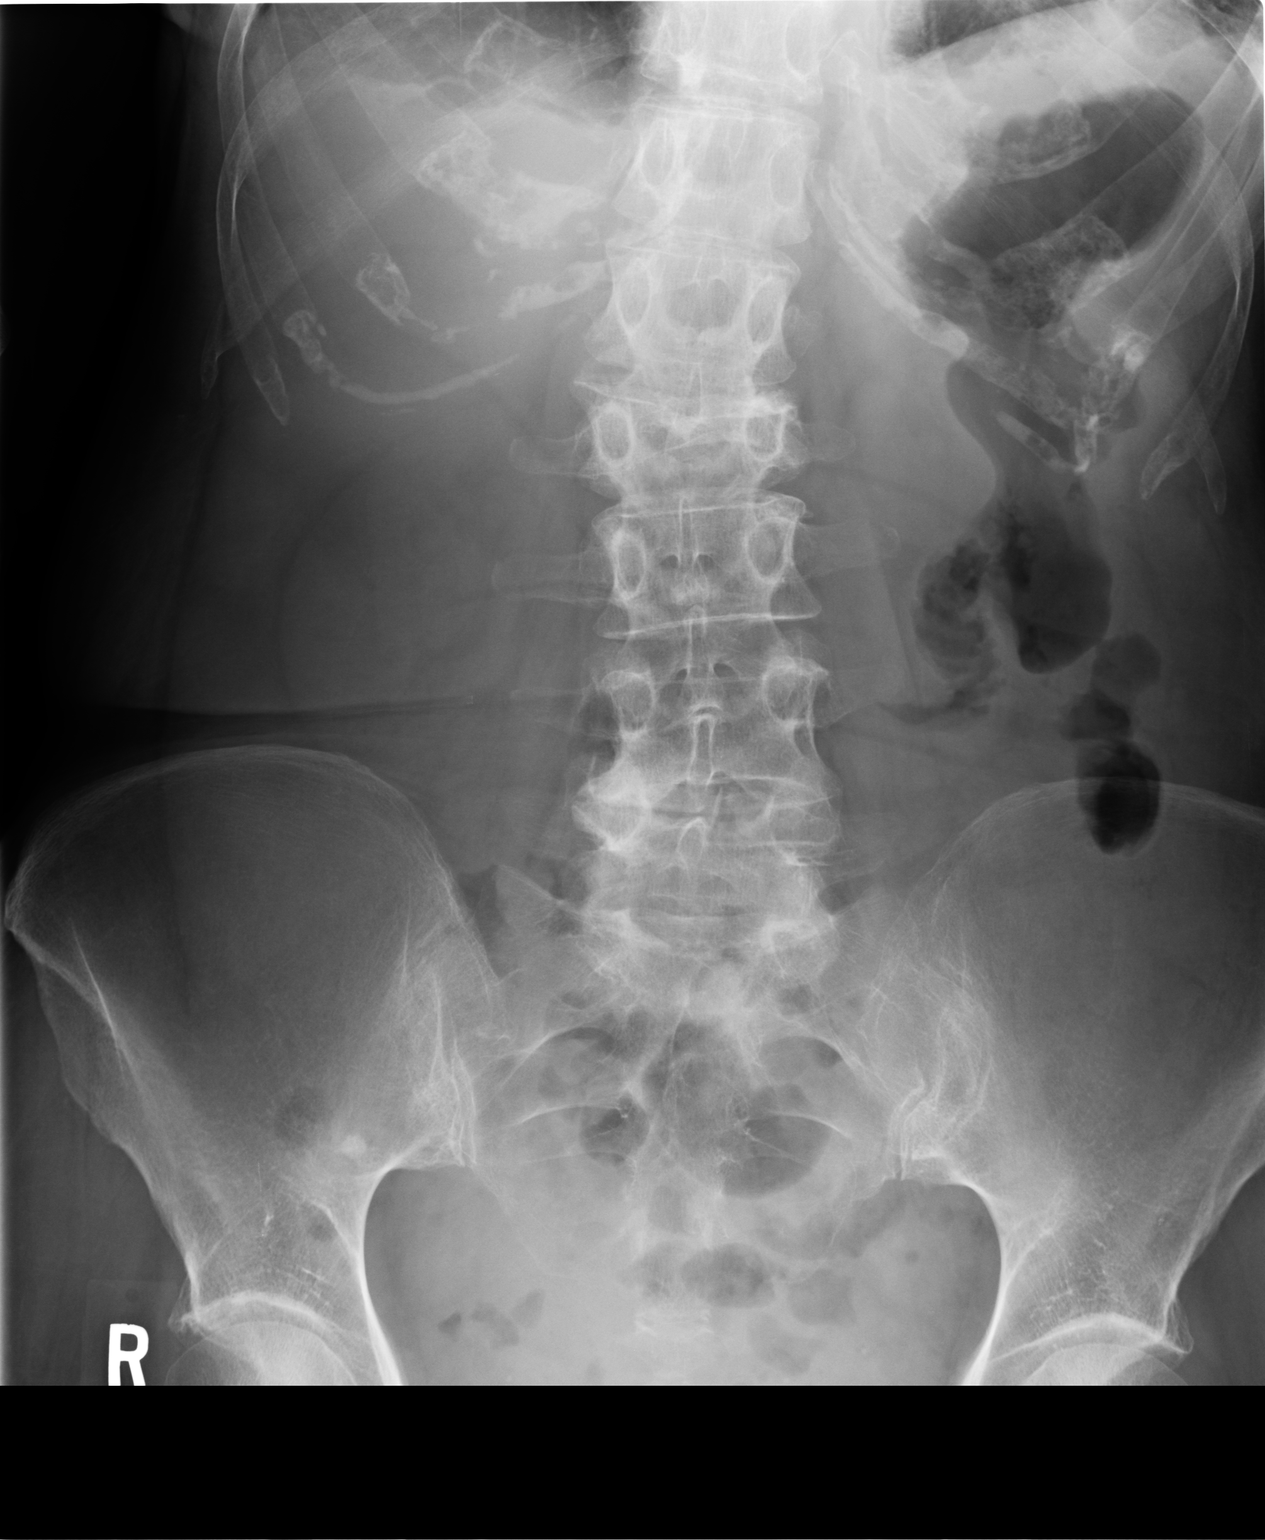
[im 3/3]
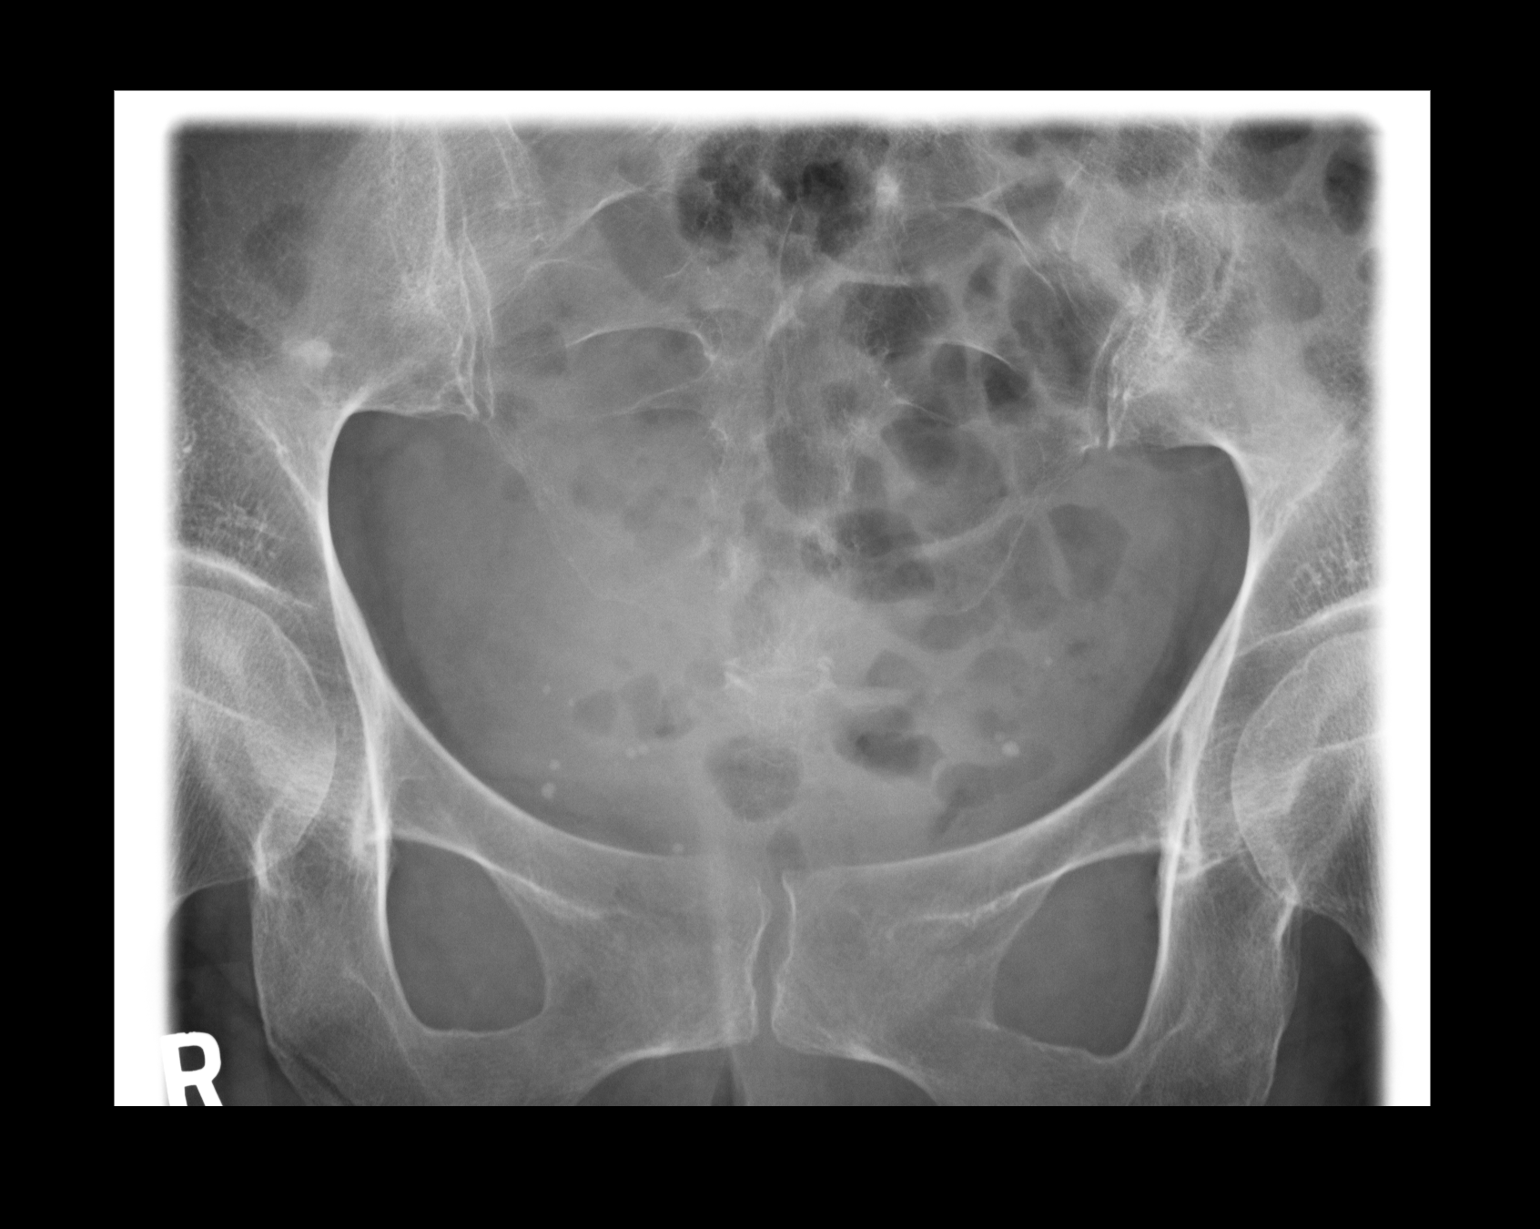

[3 of 3 positions shown; findings below may reference images not displayed]

PROCEDURE:     DXR - DXR ABDOMEN 2 V FLAT AND ERECT  - [DATE] [DATE]

RESULT:     Flat and erect views were obtained. There is overall a relative
decrease in bowel gas. No dilated loops of large or small bowel are seen.
Very little of the colon is visualized but on this exam no evidence for
thumbprinting is noted. No abnormal intraabdominal calcifications are seen.
There are multiple phleboliths in the pelvis. There are degenerative changes
of lower lumbar spine. There is increased density at the left costophrenic
angle. This been present previously and is consistent with fibrosis.  In the
AP view there is noted a slight thoracolumbar scoliosis.
IMPRESSION: No acute changes are identified.

## 2009-07-17 ENCOUNTER — Ambulatory Visit: Payer: Self-pay | Admitting: Internal Medicine

## 2010-06-16 ENCOUNTER — Ambulatory Visit: Payer: Self-pay | Admitting: Internal Medicine

## 2010-06-16 IMAGING — US ULTRASOUND RIGHT BREAST
1 series · 13 of 13 positions shown · non-contrast
Comparison: none

REASON FOR EXAM: R pain 9
COMMENTS:

PROCEDURE:     US  - US BREAST RIGHT  - [DATE]  [DATE]
RESULT:     The patient is symptomatic of pain in the 9 o'clock o'clock to
10 o'clock region of the right breast. Targeted ultrasound of this region
shows no significant sonographic abnormalities.

[Series 1: ultrasound right breast · 13 of 13 slices shown]
[im 1/13]
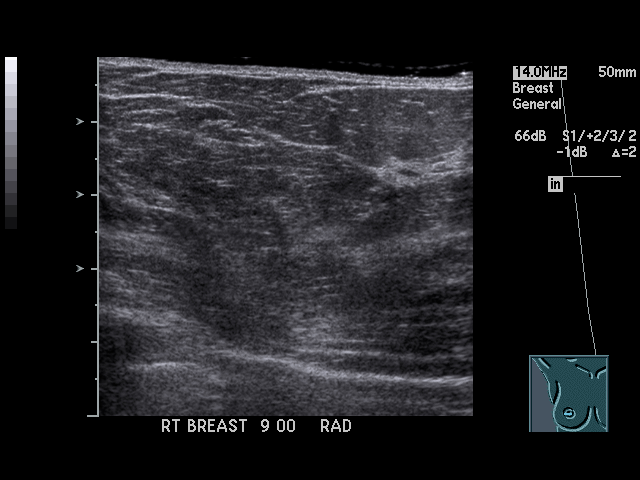
[im 2/13]
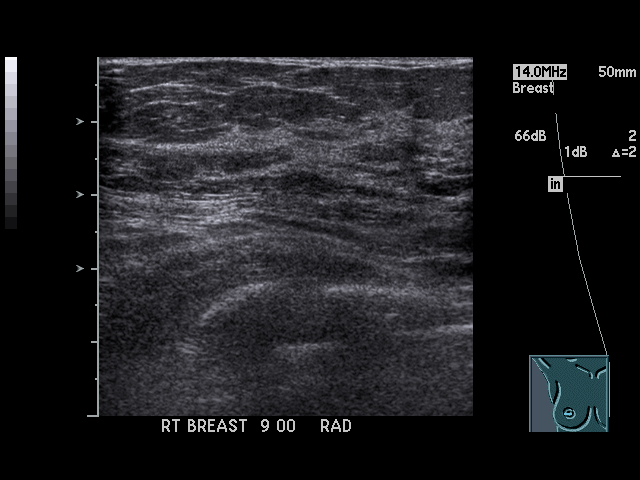
[im 3/13]
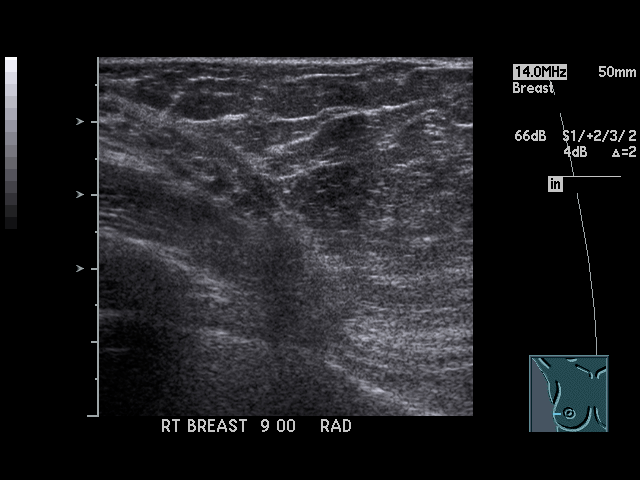
[im 4/13]
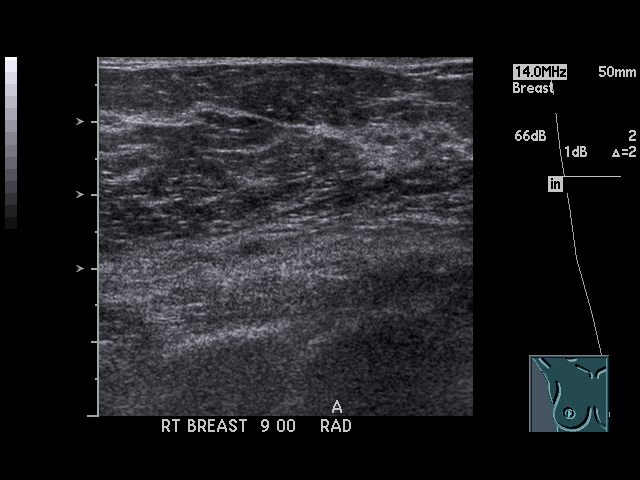
[im 5/13]
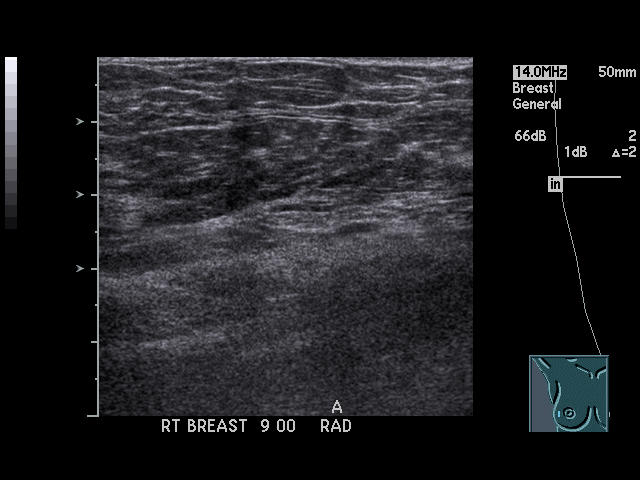
[im 6/13]
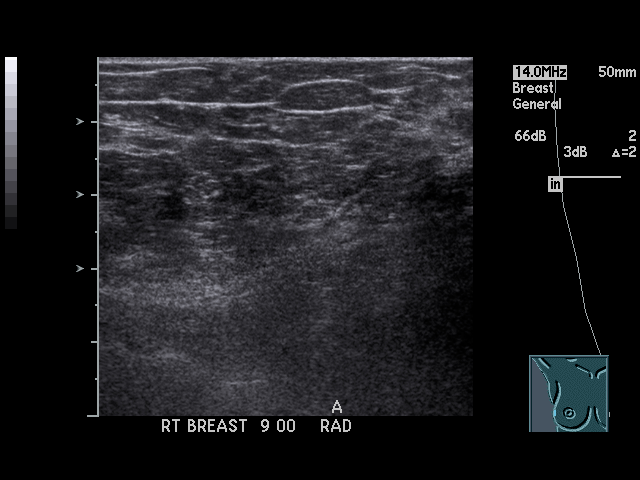
[im 7/13]
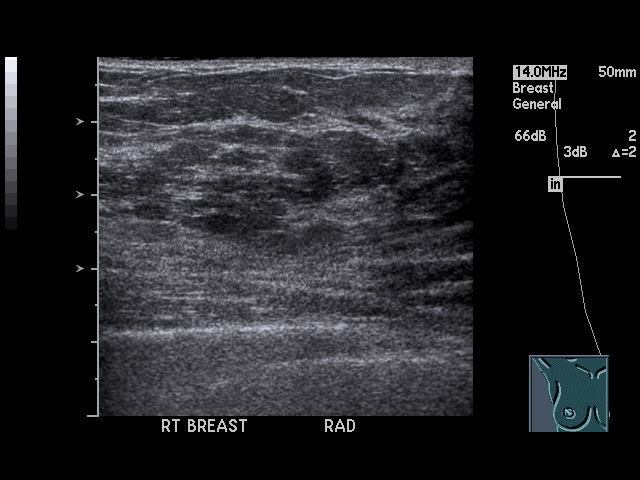
[im 8/13]
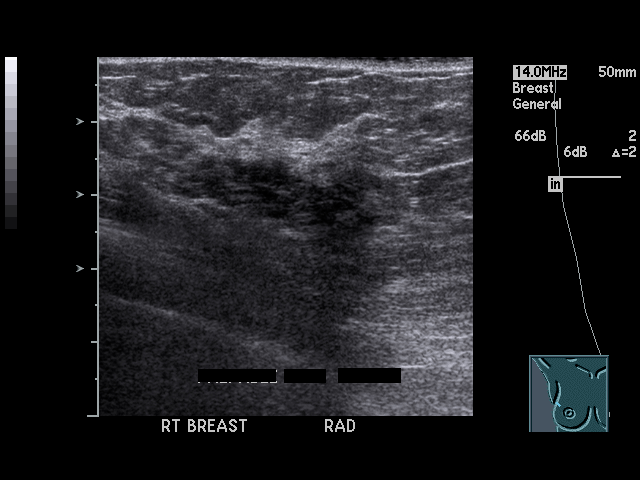
[im 9/13]
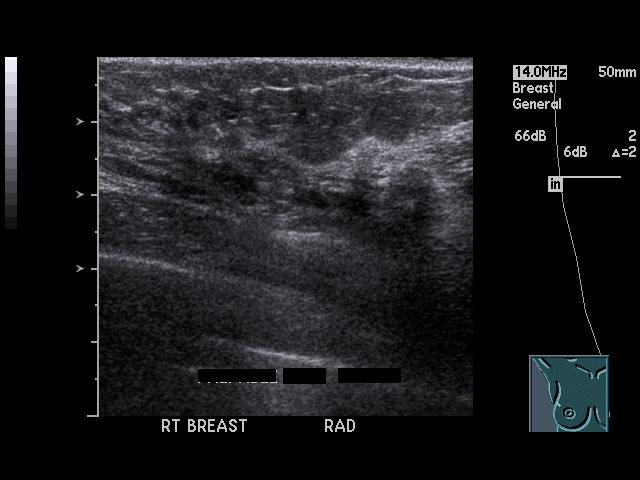
[im 10/13]
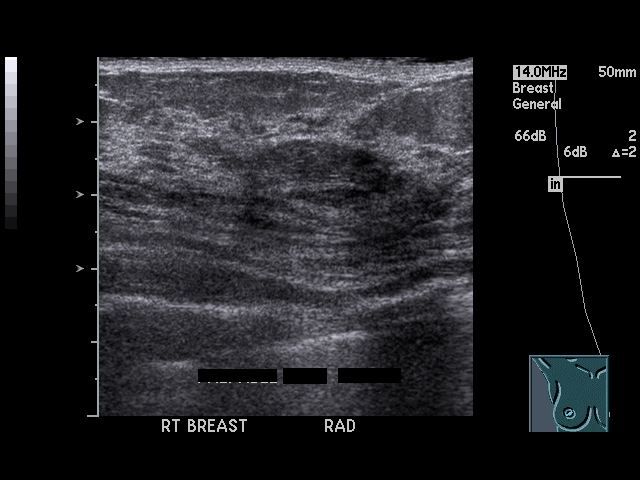
[im 11/13]
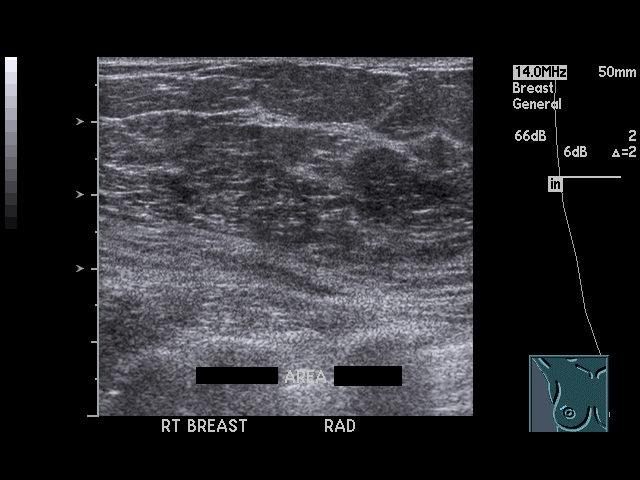
[im 12/13]
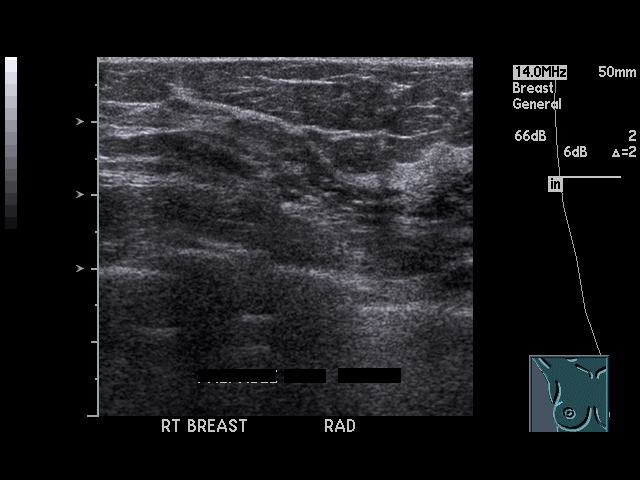
[im 13/13]
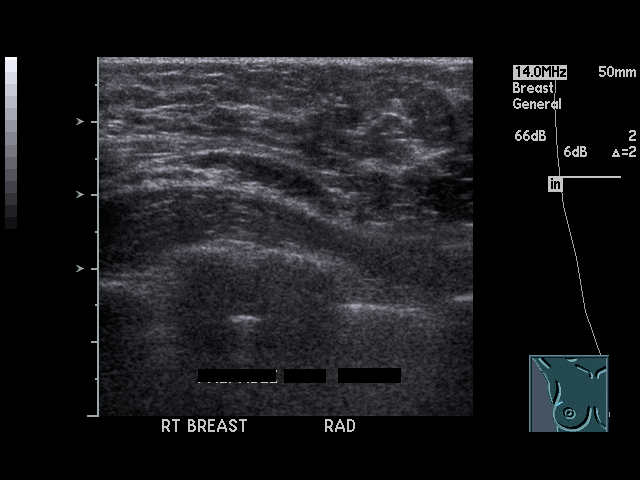

[13 of 13 positions shown; findings below may reference images not displayed]

IMPRESSION: 1.     Benign-appearing targeted right breast ultrasound.

## 2010-08-27 ENCOUNTER — Ambulatory Visit: Payer: Self-pay | Admitting: Internal Medicine

## 2010-12-29 ENCOUNTER — Ambulatory Visit: Payer: Self-pay | Admitting: Gastroenterology

## 2011-08-30 ENCOUNTER — Ambulatory Visit: Payer: Self-pay | Admitting: Internal Medicine

## 2012-08-31 ENCOUNTER — Ambulatory Visit: Payer: Self-pay | Admitting: Internal Medicine

## 2013-09-03 ENCOUNTER — Ambulatory Visit: Payer: Self-pay | Admitting: Internal Medicine

## 2013-10-17 ENCOUNTER — Ambulatory Visit: Payer: Self-pay | Admitting: Ophthalmology

## 2013-12-12 ENCOUNTER — Ambulatory Visit: Payer: Self-pay | Admitting: Ophthalmology

## 2014-09-09 ENCOUNTER — Ambulatory Visit: Payer: Self-pay | Admitting: Internal Medicine

## 2015-02-06 ENCOUNTER — Other Ambulatory Visit: Payer: Self-pay | Admitting: Internal Medicine

## 2015-02-06 DIAGNOSIS — R9389 Abnormal findings on diagnostic imaging of other specified body structures: Secondary | ICD-10-CM

## 2015-02-12 ENCOUNTER — Ambulatory Visit
Admission: RE | Admit: 2015-02-12 | Discharge: 2015-02-12 | Disposition: A | Discharge status: Home / Self Care | Payer: Medicare Other | Source: Ambulatory Visit | Attending: Internal Medicine | Admitting: Internal Medicine

## 2015-02-12 DIAGNOSIS — R938 Abnormal findings on diagnostic imaging of other specified body structures: Secondary | ICD-10-CM | POA: Diagnosis present

## 2015-02-12 DIAGNOSIS — J984 Other disorders of lung: Secondary | ICD-10-CM | POA: Diagnosis not present

## 2015-02-12 DIAGNOSIS — R918 Other nonspecific abnormal finding of lung field: Secondary | ICD-10-CM | POA: Insufficient documentation

## 2015-02-12 DIAGNOSIS — Z09 Encounter for follow-up examination after completed treatment for conditions other than malignant neoplasm: Secondary | ICD-10-CM | POA: Insufficient documentation

## 2015-02-12 DIAGNOSIS — R9389 Abnormal findings on diagnostic imaging of other specified body structures: Secondary | ICD-10-CM

## 2015-02-12 IMAGING — CT CT CHEST W/O CM
1 series · 15 of 33 positions shown, 19 images · non-contrast
Comparison: None available.

CLINICAL DATA: Abnormal chest x-ray.  Lung nodule 4 months ago.

EXAM:
CT CHEST WITHOUT CONTRAST
TECHNIQUE: Multidetector CT imaging of the chest was performed following the
standard protocol without IV contrast.

[Series 2: routine chest wo · axial · 0.64mm/px · z∈[-351,-61]mm · 15 of 68 slices shown, 19 images]
[im 5/68  mediastinal]
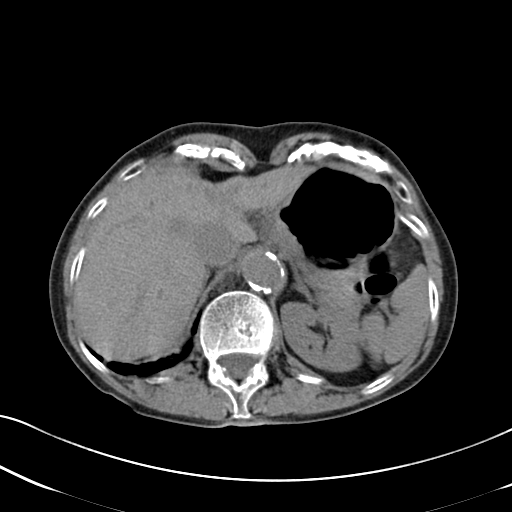
[im 5/68  lung]
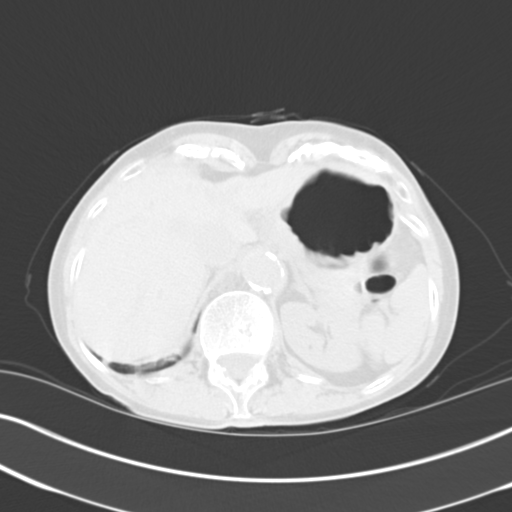
[im 10/68  lung]
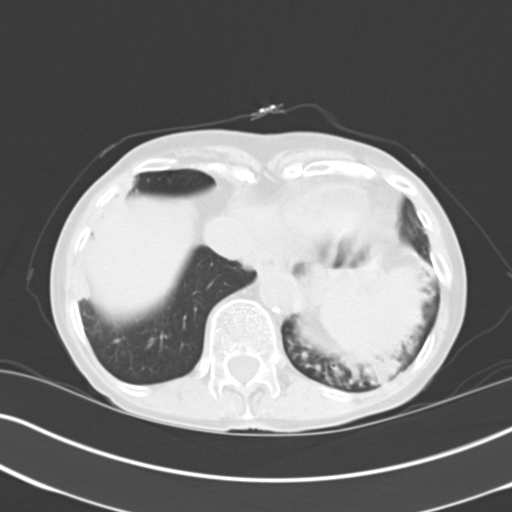
[im 14/68  lung]
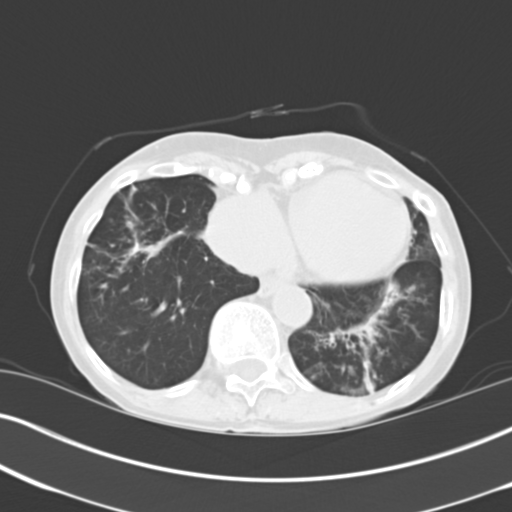
[im 18/68  lung]
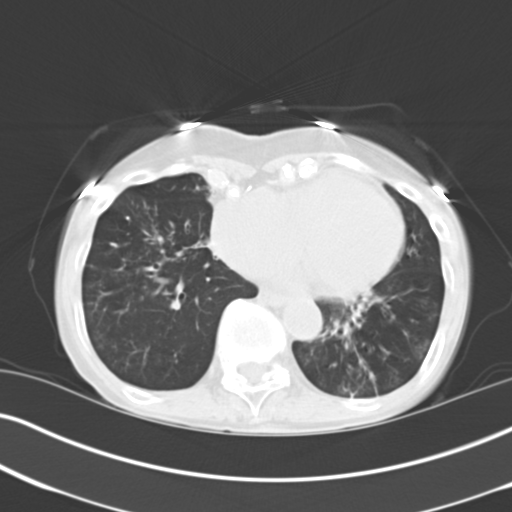
[im 23/68  mediastinal]
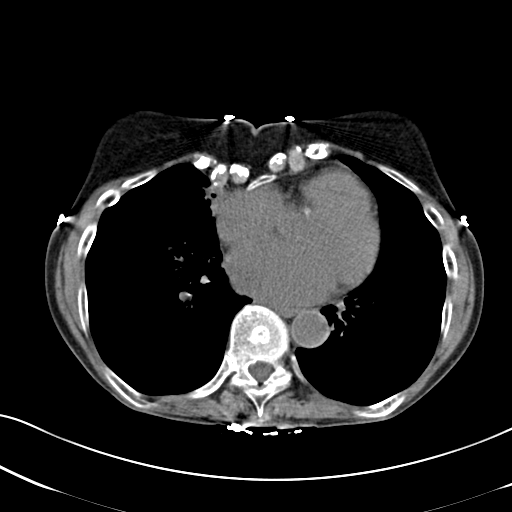
[im 23/68  lung]
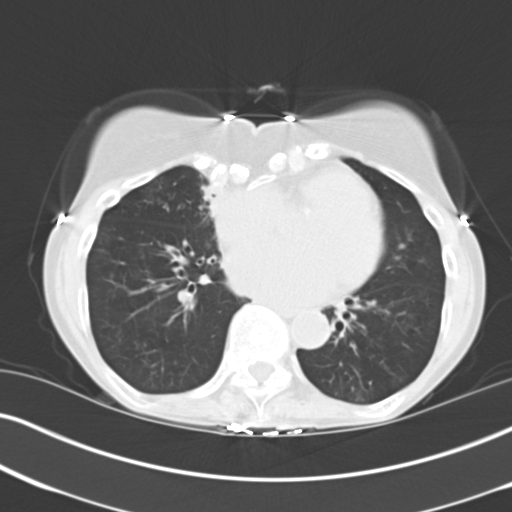
[im 27/68  lung]
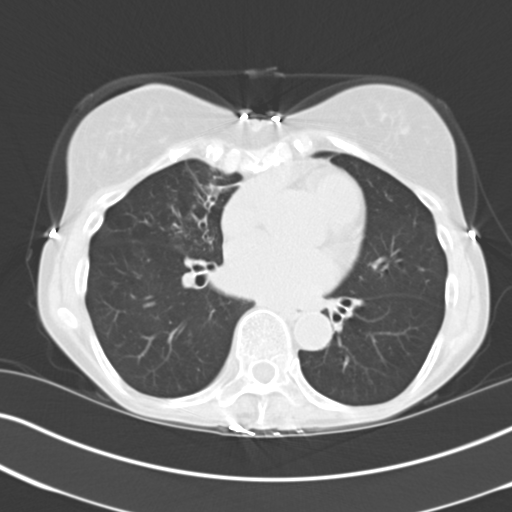
[im 30/68  lung]
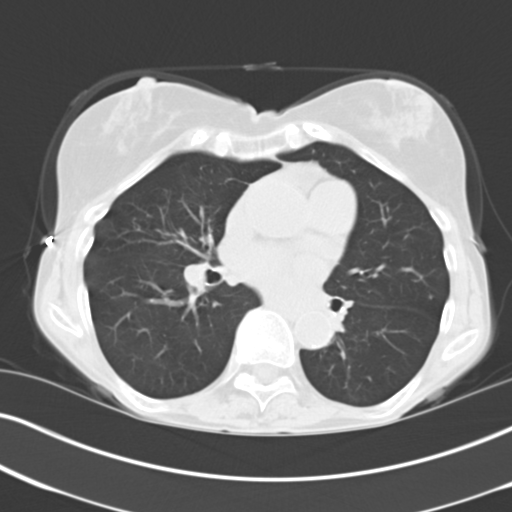
[im 35/68  lung]
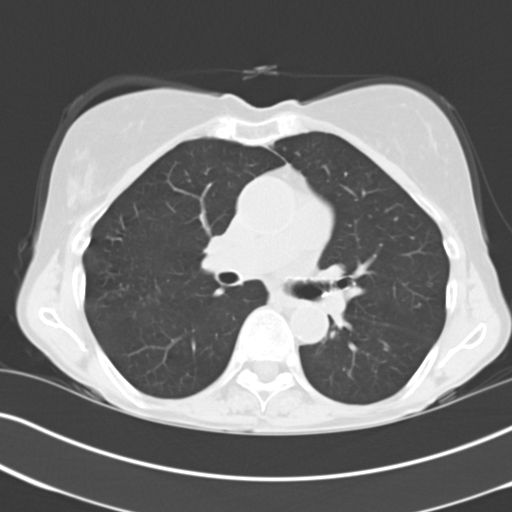
[im 38/68  mediastinal]
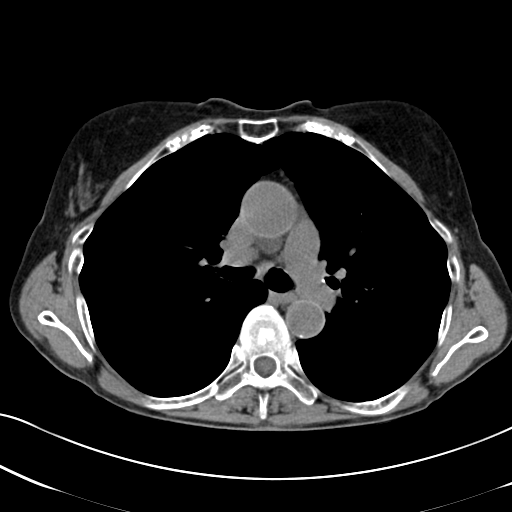
[im 38/68  lung]
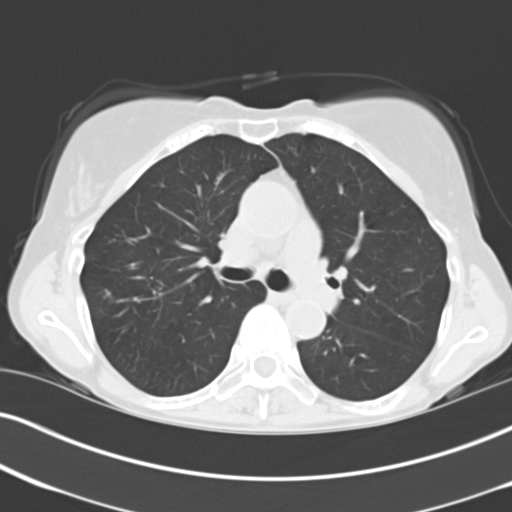
[im 41/68  lung]
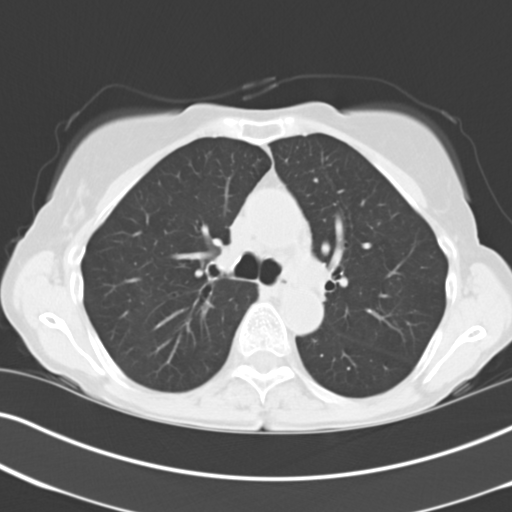
[im 45/68  lung]
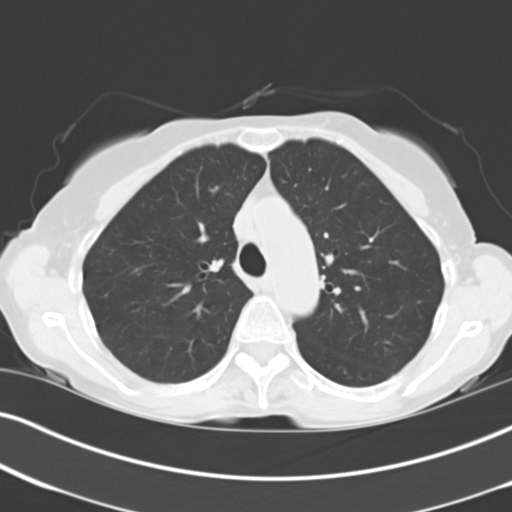
[im 50/68  lung]
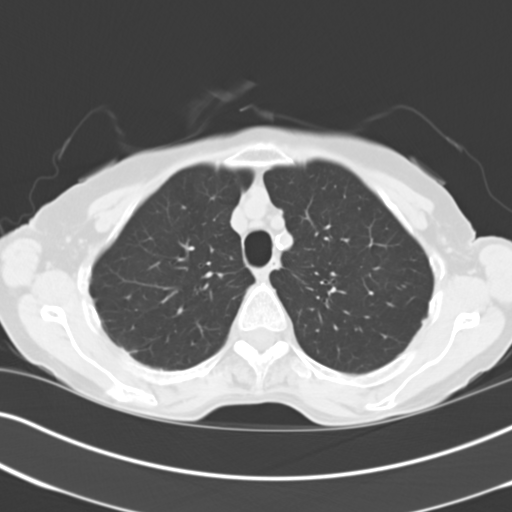
[im 54/68  mediastinal]
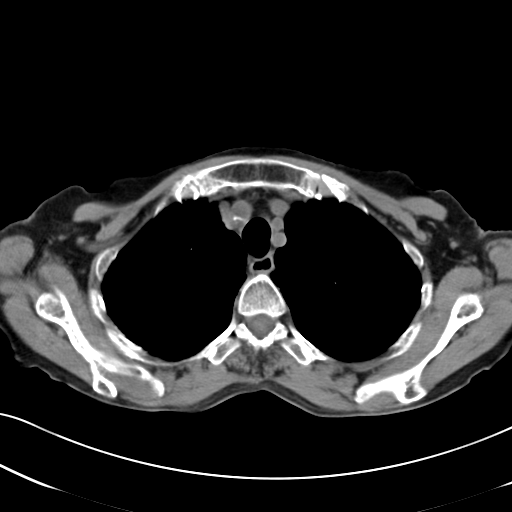
[im 54/68  lung]
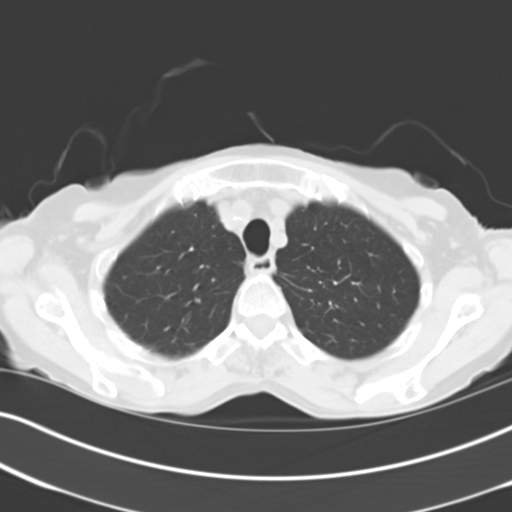
[im 58/68  lung]
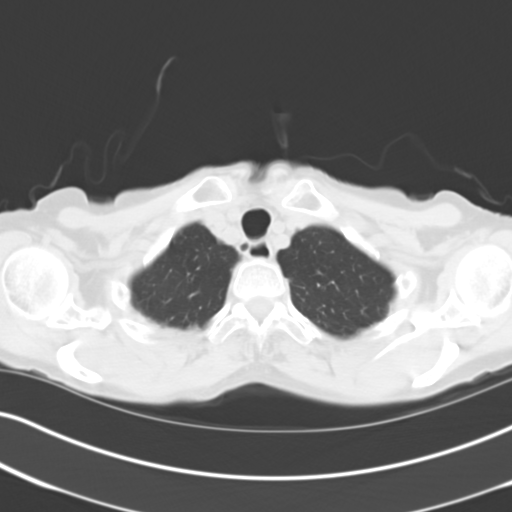
[im 63/68  lung]
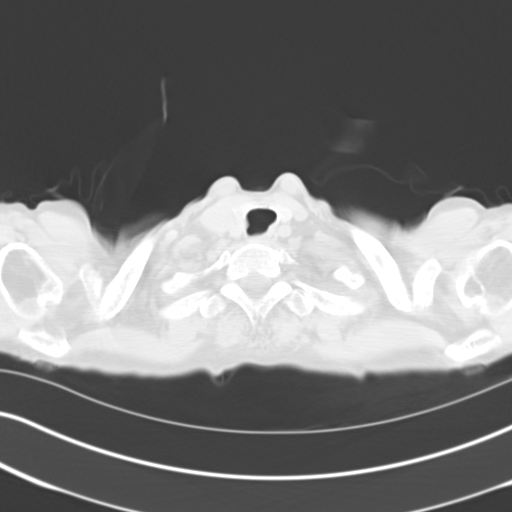

[15 of 33 positions shown; findings below may reference images not displayed]

FINDINGS: There is airway thickening with associated airspace disease in both
lower lobes and lung bases. Mild nodularity to the airspace disease
with in the left lung base.

There is similar findings within the right middle lobe. Cluster of
micro nodules noted posteriorly in the right upper lobe and
posterior lower lobes bilaterally. These findings are most
suggestive of an infectious or inflammatory process. Findings could
reflect chronic YAHUXA infection.

No pleural effusions. Heart is mildly enlarged. Aorta is non
aneurysmal. No mediastinal, hilar, or axillary adenopathy. Chest
wall soft tissues are unremarkable. Imaging into the upper abdomen
shows no acute findings.

No acute bony abnormality or focal bone lesion.
IMPRESSION: Airway thickening and associated airspace disease and nodularity in
both lung bases and in the right middle lobe anteriorly medially.
Also clustered micro nodules in the right upper lobe and both lower
lobes. Findings most suggestive of an infectious or inflammatory
process. Chronic mycobacterium avium infection could have this
appearance.

## 2015-07-29 ENCOUNTER — Other Ambulatory Visit: Payer: Self-pay | Admitting: Internal Medicine

## 2015-07-29 DIAGNOSIS — Z1231 Encounter for screening mammogram for malignant neoplasm of breast: Secondary | ICD-10-CM

## 2015-09-11 ENCOUNTER — Other Ambulatory Visit: Payer: Self-pay | Admitting: Internal Medicine

## 2015-09-11 ENCOUNTER — Ambulatory Visit
Admission: RE | Admit: 2015-09-11 | Discharge: 2015-09-11 | Disposition: A | Discharge status: Home / Self Care | Payer: Medicare Other | Source: Ambulatory Visit | Attending: Internal Medicine | Admitting: Internal Medicine

## 2015-09-11 DIAGNOSIS — Z1231 Encounter for screening mammogram for malignant neoplasm of breast: Secondary | ICD-10-CM

## 2016-08-06 ENCOUNTER — Other Ambulatory Visit: Payer: Self-pay | Admitting: Internal Medicine

## 2016-08-06 DIAGNOSIS — Z1231 Encounter for screening mammogram for malignant neoplasm of breast: Secondary | ICD-10-CM

## 2016-09-15 ENCOUNTER — Ambulatory Visit
Admission: RE | Admit: 2016-09-15 | Discharge: 2016-09-15 | Disposition: A | Discharge status: Home / Self Care | Payer: Medicare Other | Source: Ambulatory Visit | Attending: Internal Medicine | Admitting: Internal Medicine

## 2016-09-15 DIAGNOSIS — Z1231 Encounter for screening mammogram for malignant neoplasm of breast: Secondary | ICD-10-CM | POA: Diagnosis not present

## 2017-08-18 ENCOUNTER — Other Ambulatory Visit: Payer: Self-pay | Admitting: Internal Medicine

## 2017-08-18 DIAGNOSIS — Z1231 Encounter for screening mammogram for malignant neoplasm of breast: Secondary | ICD-10-CM

## 2017-09-27 ENCOUNTER — Ambulatory Visit
Admission: RE | Admit: 2017-09-27 | Discharge: 2017-09-27 | Disposition: A | Discharge status: Home / Self Care | Payer: Medicare Other | Source: Ambulatory Visit | Attending: Internal Medicine | Admitting: Internal Medicine

## 2017-09-27 DIAGNOSIS — Z1231 Encounter for screening mammogram for malignant neoplasm of breast: Secondary | ICD-10-CM | POA: Diagnosis not present

## 2018-06-06 ENCOUNTER — Emergency Department: Payer: Medicare Other

## 2018-06-06 ENCOUNTER — Encounter: Payer: Self-pay | Admitting: Emergency Medicine

## 2018-06-06 ENCOUNTER — Other Ambulatory Visit: Payer: Self-pay

## 2018-06-06 ENCOUNTER — Emergency Department
Admission: EM | Admit: 2018-06-06 | Discharge: 2018-06-06 | Disposition: A | Discharge status: Home / Self Care | Payer: Medicare Other | Attending: Emergency Medicine | Admitting: Emergency Medicine

## 2018-06-06 DIAGNOSIS — R531 Weakness: Secondary | ICD-10-CM | POA: Diagnosis not present

## 2018-06-06 DIAGNOSIS — R42 Dizziness and giddiness: Secondary | ICD-10-CM | POA: Diagnosis present

## 2018-06-06 DIAGNOSIS — I482 Chronic atrial fibrillation, unspecified: Secondary | ICD-10-CM | POA: Diagnosis not present

## 2018-06-06 HISTORY — DX: Unspecified atrial fibrillation: I48.91

## 2018-06-06 LAB — CBC WITH DIFFERENTIAL/PLATELET
Abs Immature Granulocytes: 0.03 10*3/uL (ref 0.00–0.07)
BASOS ABS: 0 10*3/uL (ref 0.0–0.1)
BASOS PCT: 0 %
EOS ABS: 0 10*3/uL (ref 0.0–0.5)
EOS PCT: 0 %
HEMATOCRIT: 41.3 % (ref 36.0–46.0)
Hemoglobin: 13.5 g/dL (ref 12.0–15.0)
Immature Granulocytes: 0 %
LYMPHS ABS: 0.5 10*3/uL — AB (ref 0.7–4.0)
Lymphocytes Relative: 6 %
MCH: 30.3 pg (ref 26.0–34.0)
MCHC: 32.7 g/dL (ref 30.0–36.0)
MCV: 92.6 fL (ref 80.0–100.0)
Monocytes Absolute: 0.5 10*3/uL (ref 0.1–1.0)
Monocytes Relative: 7 %
NRBC: 0 % (ref 0.0–0.2)
Neutro Abs: 6.9 10*3/uL (ref 1.7–7.7)
Neutrophils Relative %: 87 %
Platelets: 254 10*3/uL (ref 150–400)
RBC: 4.46 MIL/uL (ref 3.87–5.11)
RDW: 13 % (ref 11.5–15.5)
WBC: 7.9 10*3/uL (ref 4.0–10.5)

## 2018-06-06 LAB — COMPREHENSIVE METABOLIC PANEL
ALBUMIN: 4.1 g/dL (ref 3.5–5.0)
ALT: 25 U/L (ref 0–44)
ANION GAP: 10 (ref 5–15)
AST: 35 U/L (ref 15–41)
Alkaline Phosphatase: 71 U/L (ref 38–126)
BILIRUBIN TOTAL: 0.6 mg/dL (ref 0.3–1.2)
BUN: 15 mg/dL (ref 8–23)
CO2: 30 mmol/L (ref 22–32)
Calcium: 9.2 mg/dL (ref 8.9–10.3)
Chloride: 93 mmol/L — ABNORMAL LOW (ref 98–111)
Creatinine, Ser: 0.68 mg/dL (ref 0.44–1.00)
GFR calc Af Amer: >60 mL/min (ref >60)
GFR calc non Af Amer: >60 mL/min (ref >60)
GLUCOSE: 84 mg/dL (ref 70–99)
POTASSIUM: 4.3 mmol/L (ref 3.5–5.1)
Sodium: 133 mmol/L — ABNORMAL LOW (ref 135–145)
TOTAL PROTEIN: 7.3 g/dL (ref 6.5–8.1)

## 2018-06-06 LAB — TROPONIN I: Troponin I: <0.03 ng/mL (ref <0.03)

## 2018-06-06 LAB — URINALYSIS, COMPLETE (UACMP) WITH MICROSCOPIC
BILIRUBIN URINE: NEGATIVE
Bacteria, UA: NONE SEEN
GLUCOSE, UA: NEGATIVE mg/dL
Hgb urine dipstick: NEGATIVE
KETONES UR: NEGATIVE mg/dL
LEUKOCYTES UA: NEGATIVE
NITRITE: NEGATIVE
PH: 7 (ref 5.0–8.0)
PROTEIN: NEGATIVE mg/dL
Specific Gravity, Urine: 1.005 (ref 1.005–1.030)
Squamous Epithelial / LPF: NONE SEEN (ref 0–5)
WBC, UA: NONE SEEN WBC/hpf (ref 0–5)

## 2018-06-06 LAB — TSH: TSH: 3.013 u[IU]/mL (ref 0.350–4.500)

## 2018-06-06 LAB — GLUCOSE, CAPILLARY: Glucose-Capillary: 75 mg/dL (ref 70–99)

## 2018-06-06 IMAGING — CT CT HEAD W/O CM
3 series · 16 of 46 positions shown, 19 images · non-contrast
Comparison: None.

CLINICAL DATA: Headache.  Normal neuro exam

EXAM:
CT HEAD WITHOUT CONTRAST
TECHNIQUE: Contiguous axial images were obtained from the base of the skull
through the vertex without intravenous contrast.

[Series 2: head wo · axial · 0.42mm/px · z∈[-68,+52]mm · 10 of 29 slices shown, 13 images]
[im 3/29  brain]
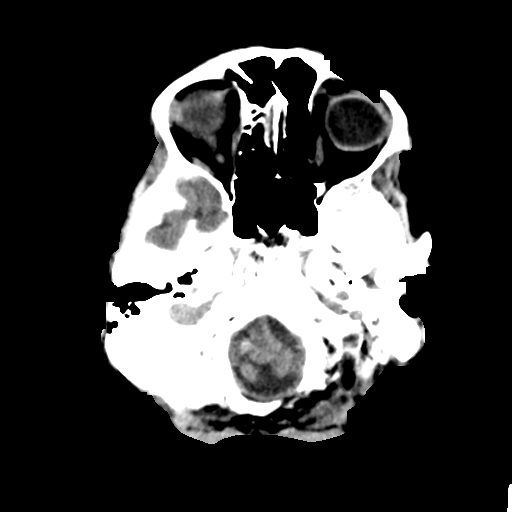
[im 3/29  bone]
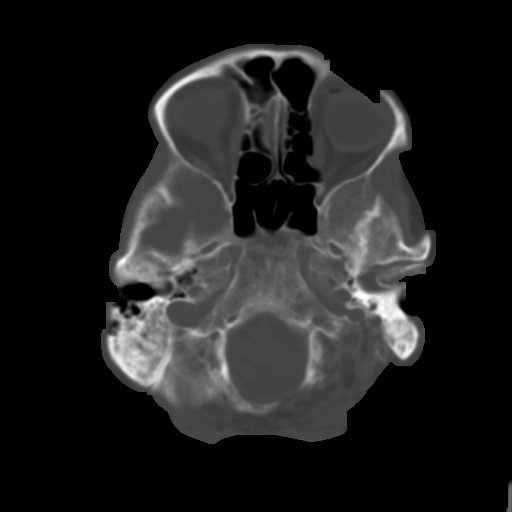
[im 6/29  brain]
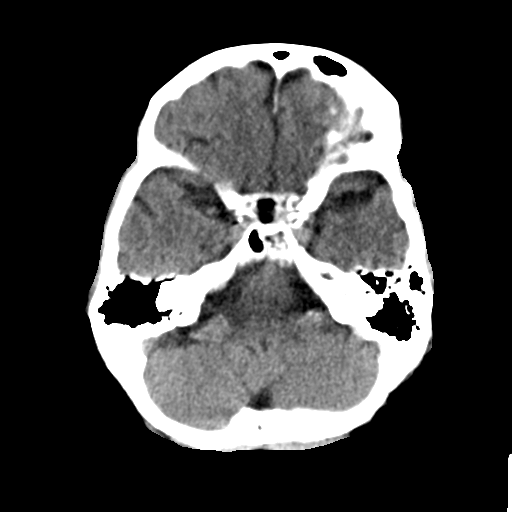
[im 8/29  brain]
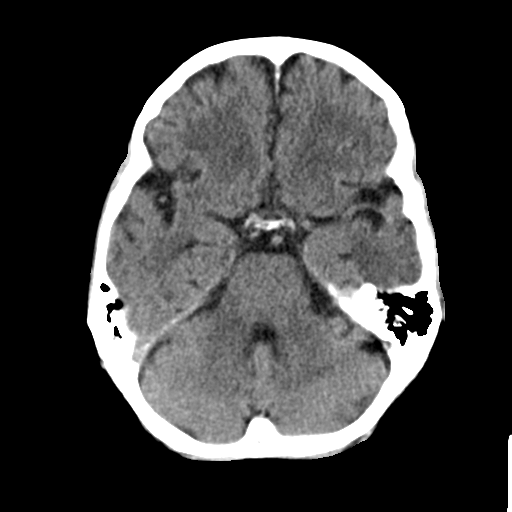
[im 11/29  brain]
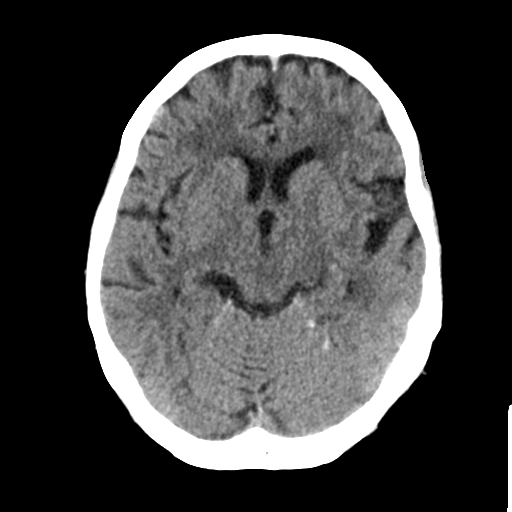
[im 14/29  brain]
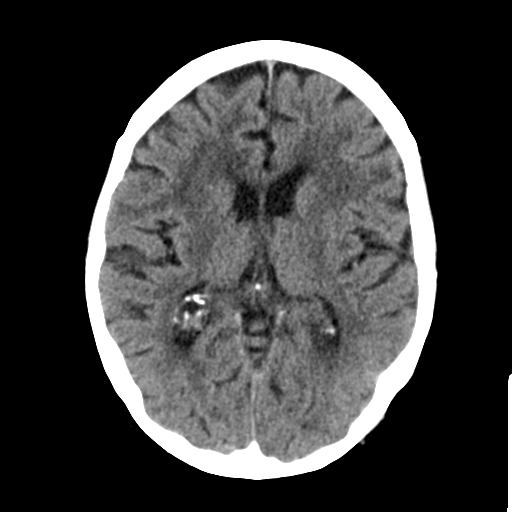
[im 14/29  bone]
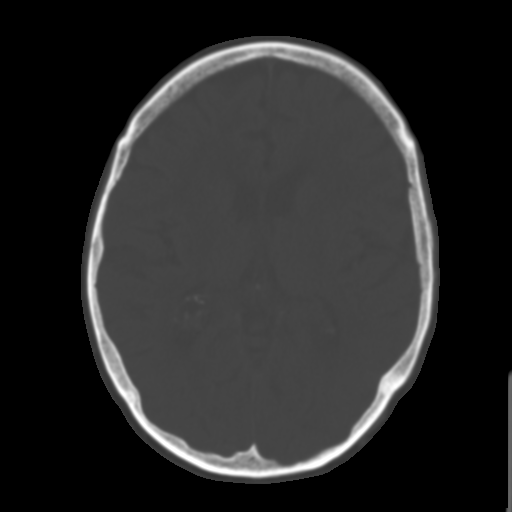
[im 16/29  brain]
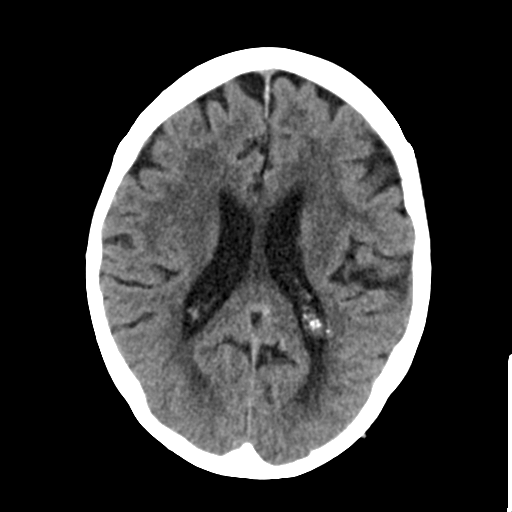
[im 19/29  brain]
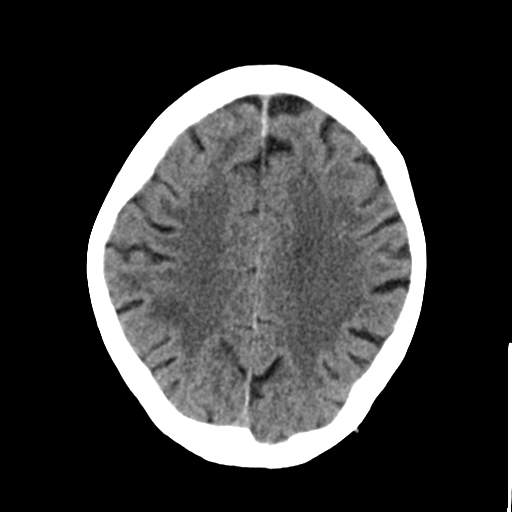
[im 22/29  brain]
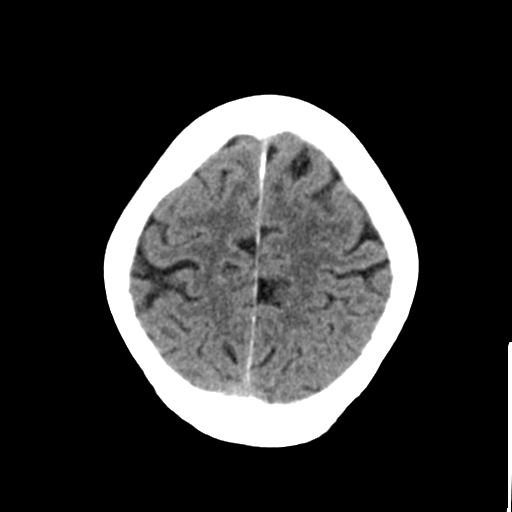
[im 24/29  brain]
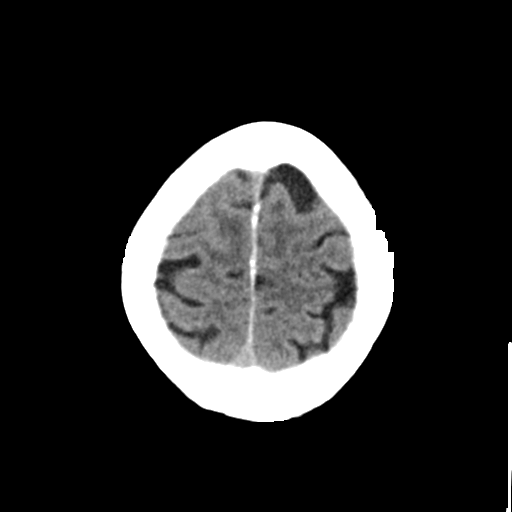
[im 24/29  bone]
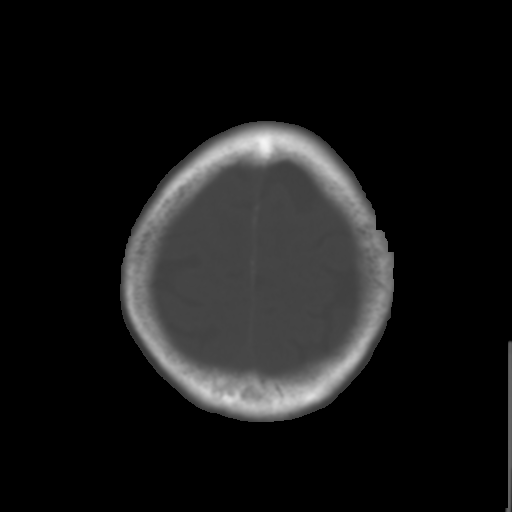
[im 27/29  brain]
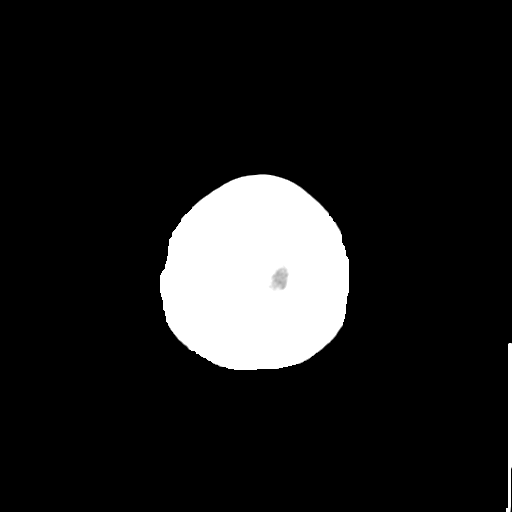

[Series 4: coronal soft tissue · coronal · 0.31mm/px · 3 of 66 slices shown]
[im 22/66  brain]
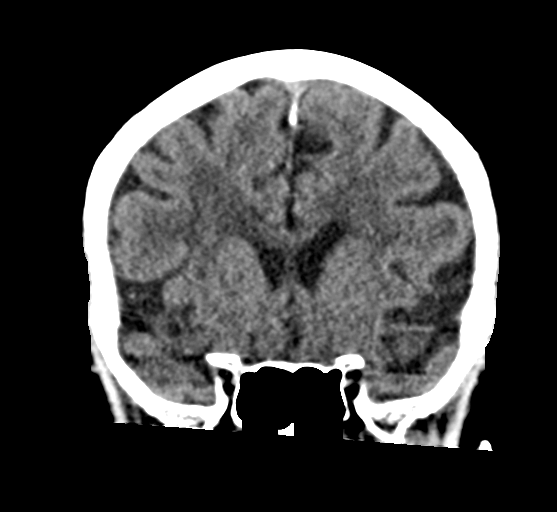
[im 29/66  brain]
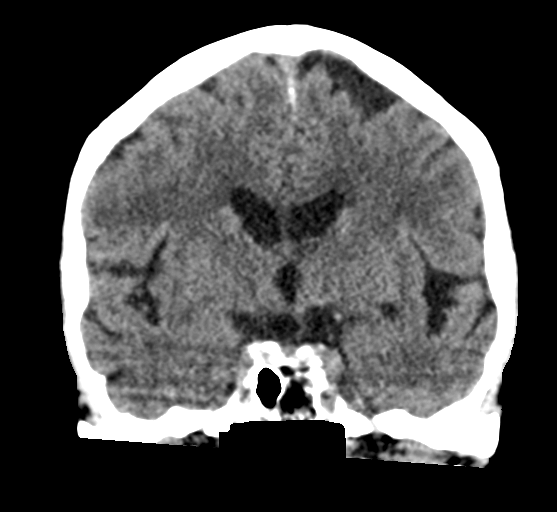
[im 37/66  brain]
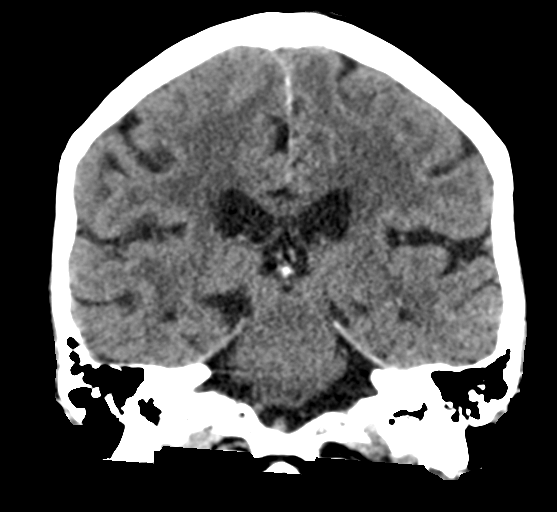

[Series 5: sagittal soft tissue · sagittal · 0.31mm/px · 3 of 52 slices shown]
[im 18/52  brain]
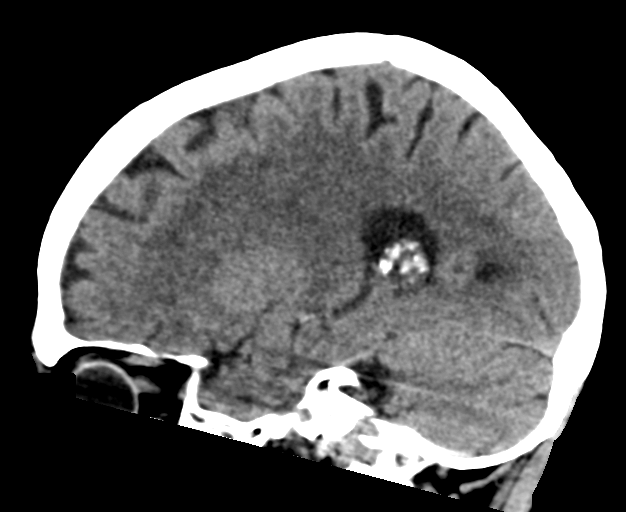
[im 26/52  brain]
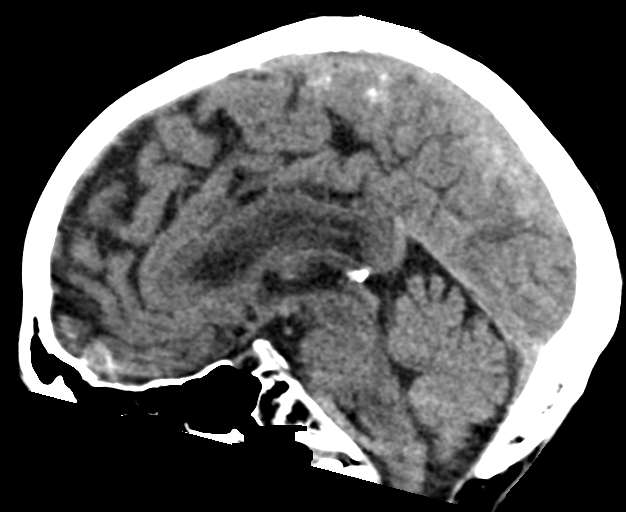
[im 35/52  brain]
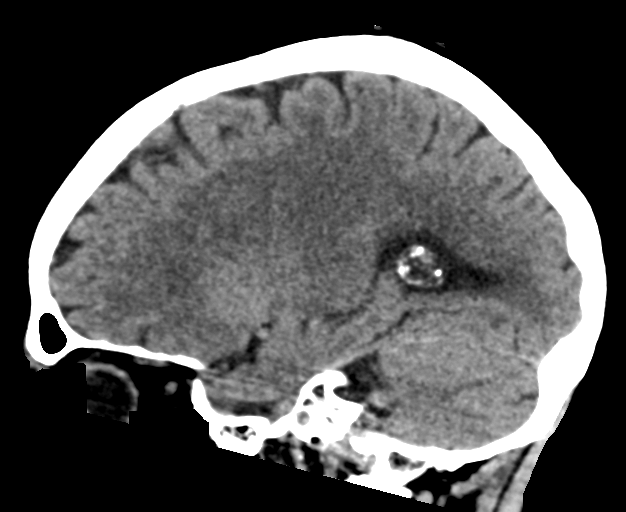

[16 of 46 positions shown; findings below may reference images not displayed]

FINDINGS: Brain: Mild atrophy without hydrocephalus. Moderate white matter
disease diffusely consistent with chronic microvascular ischemia.
Benign-appearing cyst in the left basal ganglia.

Negative for acute infarct, hemorrhage, or mass.

Vascular: Negative for hyperdense vessel

Skull: Negative

Sinuses/Orbits: Negative

Other: None
IMPRESSION: No acute abnormality. Atrophy and moderate chronic microvascular
ischemic changes in the white matter.

## 2018-06-06 MED ORDER — KETOROLAC TROMETHAMINE 30 MG/ML IJ SOLN
15.0000 mg | Freq: Once | INTRAMUSCULAR | Status: AC
  Administered 2018-06-06: 15 mg via INTRAVENOUS
  Filled 2018-06-06: qty 1

## 2018-06-06 MED ORDER — HYDROMORPHONE HCL 1 MG/ML IJ SOLN: 1.0000 mg | Freq: Once | INTRAMUSCULAR | Status: DC

## 2018-06-06 MED ORDER — LORAZEPAM 0.5 MG PO TABS
0.5000 mg | ORAL_TABLET | Freq: Once | ORAL | Status: AC
  Administered 2018-06-06: 0.5 mg via ORAL
  Filled 2018-06-06: qty 1

## 2018-06-06 MED ORDER — DILTIAZEM HCL 25 MG/5ML IV SOLN
10.0000 mg | Freq: Once | INTRAVENOUS | Status: AC
  Administered 2018-06-06: 10 mg via INTRAVENOUS
  Filled 2018-06-06: qty 5

## 2018-06-06 MED ORDER — IBUPROFEN 600 MG PO TABS: 600.0000 mg | ORAL_TABLET | Freq: Three times a day (TID) | ORAL | 0 refills | Status: DC | PRN

## 2018-06-06 MED ORDER — LORAZEPAM 2 MG/ML IJ SOLN: 1.0000 mg | Freq: Once | INTRAMUSCULAR | Status: DC

## 2018-06-06 MED ORDER — LORAZEPAM 0.5 MG PO TABS: 0.5000 mg | ORAL_TABLET | Freq: Three times a day (TID) | ORAL | 0 refills | Status: DC | PRN

## 2018-06-06 MED ORDER — SODIUM CHLORIDE 0.9 % IV BOLUS
500.0000 mL | Freq: Once | INTRAVENOUS | Status: AC
  Administered 2018-06-06: 500 mL via INTRAVENOUS

## 2018-06-06 NOTE — ED Triage Notes (Signed)
Pt presents to ED via [facility] c/o generalized weakness and dizzyness/lightheadedness. Pt states "it feels like my head belongs to someone else" re: lightheadedness. Denies pain. Hx afib, HR on arrival 110-120s on monitor. On metoprolol and xarelto.

## 2018-06-06 NOTE — ED Provider Notes (Signed)
[facility] Emergency Department Provider Note       Time seen: ----------------------------------------- 10:35 AM on 06/06/2018 -----------------------------------------   I have reviewed the triage vital signs and the nursing notes.  HISTORY   Chief Complaint No chief complaint on file.    HPI Kaylee Santiago is a 83 y.o. female with a history of paroxysmal atrial fibrillation who presents to the ED for dizziness and weakness.  Patient had felt near syncopal earlier.  Currently she feels somewhat better.  She has felt her heart racing.  She took an extra metoprolol.  She denies fevers, chills, chest pain, shortness of breath, vomiting or diarrhea.  No past medical history on file.  There are no active problems to display for this patient.   Past Surgical History:  Procedure Laterality Date  . BREAST EXCISIONAL BIOPSY Right 70's    Allergies Patient has no allergy information on record.  Social History Social History   Tobacco Use  . Smoking status: Not on file  Substance Use Topics  . Alcohol use: Not on file  . Drug use: Not on file   Review of Systems Constitutional: Negative for fever. Cardiovascular: Negative for chest pain. Respiratory: Negative for shortness of breath. Gastrointestinal: Negative for abdominal pain, vomiting and diarrhea. Musculoskeletal: Negative for back pain. Skin: Negative for rash. Neurological: Positive for weakness and dizziness  All systems negative/normal/unremarkable except as stated in the HPI  ____________________________________________   PHYSICAL EXAM:  VITAL SIGNS: ED Triage Vitals  Enc Vitals Group     BP      Pulse      Resp      Temp      Temp src      SpO2      Weight      Height      Head Circumference      Peak Flow      Pain Score      Pain Loc      Pain Edu?      Excl. in GC?    Constitutional: Alert and oriented. Well appearing and in no distress. Eyes: Conjunctivae are  normal. Normal extraocular movements. ENT   Head: Normocephalic and atraumatic.   Nose: No congestion/rhinnorhea.   Mouth/Throat: Mucous membranes are moist.   Neck: No stridor. Cardiovascular: Rapid rate, irregular rhythm. No murmurs, rubs, or gallops. Respiratory: Normal respiratory effort without tachypnea nor retractions. Breath sounds are clear and equal bilaterally. No wheezes/rales/rhonchi. Gastrointestinal: Soft and nontender. Normal bowel sounds Musculoskeletal: Nontender with normal range of motion in extremities. No lower extremity tenderness nor edema. Neurologic:  Normal speech and language. No gross focal neurologic deficits are appreciated.  Skin:  Skin is warm, dry and intact. No rash noted. Psychiatric: Mood and affect are normal. Speech and behavior are normal.  ____________________________________________  EKG: Interpreted by me.  Atrial fibrillation with a rapid ventricular response, rate is 110 bpm, left axis deviation, normal QT  ____________________________________________  ED COURSE:  As part of my medical decision making, I reviewed the following data within the electronic MEDICAL RECORD NUMBER History obtained from family if available, nursing notes, old chart and ekg, as well as notes from prior ED visits. Patient presented for dizziness, we will assess with labs and imaging as indicated at this time.   Procedures ____________________________________________   LABS (pertinent positives/negatives)  Labs Reviewed  CBC WITH DIFFERENTIAL/PLATELET - Abnormal; Notable for the following components:      Result Value   Lymphs Abs  0.5 (*)    All other components within normal limits  COMPREHENSIVE METABOLIC PANEL - Abnormal; Notable for the following components:   Sodium 133 (*)    Chloride 93 (*)    All other components within normal limits  URINALYSIS, COMPLETE (UACMP) WITH MICROSCOPIC - Abnormal; Notable for the following components:   Color, Urine  STRAW (*)    APPearance CLEAR (*)    All other components within normal limits  TROPONIN I  TSH  GLUCOSE, CAPILLARY  CBG MONITORING, ED    RADIOLOGY Images were viewed by me  CT head IMPRESSION: No acute abnormality. Atrophy and moderate chronic microvascular ischemic changes in the white matter. ____________________________________________  DIFFERENTIAL DIAGNOSIS   Arrhythmia, dehydration, electrolyte abnormality, MI, hyperthyroidism, occult infection  FINAL ASSESSMENT AND PLAN  Atrial fibrillation, dizziness   Plan: The patient had presented for dizziness. Patient's labs are unremarkable. Patient's imaging was also unremarkable.  CT scan was performed because she was describing that her head felt funny like it belonged to someone else.  Initially we gave her 10 mg of IV Cardizem which improved her heart rate from 110 down to the 70s and 80s.  She has remained in A. fib.  I discussed with her cardiologist who will see her tomorrow at 10 AM for recheck.  Patient is agreeable to plan.   Ulice DashJohnathan E Jordann Grime, MD   Note: This note was generated in part or whole with voice recognition software. Voice recognition is usually quite accurate but there are transcription errors that can and very often do occur. I apologize for any typographical errors that were not detected and corrected.     Emily FilbertWilliams, Mayana Irigoyen E, MD 06/06/18 [phone]

## 2018-06-06 NOTE — ED Notes (Signed)

## 2018-06-29 ENCOUNTER — Other Ambulatory Visit: Payer: Self-pay

## 2018-06-29 ENCOUNTER — Inpatient Hospital Stay
Admission: EM | Admit: 2018-06-29 | Discharge: 2018-07-01 | DRG: 194 | Disposition: A | Discharge status: Home / Self Care | Payer: Medicare Other | Attending: Internal Medicine | Admitting: Internal Medicine

## 2018-06-29 ENCOUNTER — Emergency Department: Payer: Medicare Other

## 2018-06-29 ENCOUNTER — Encounter: Payer: Self-pay | Admitting: Emergency Medicine

## 2018-06-29 DIAGNOSIS — E222 Syndrome of inappropriate secretion of antidiuretic hormone: Secondary | ICD-10-CM | POA: Diagnosis present

## 2018-06-29 DIAGNOSIS — Z79890 Hormone replacement therapy (postmenopausal): Secondary | ICD-10-CM

## 2018-06-29 DIAGNOSIS — Z90710 Acquired absence of both cervix and uterus: Secondary | ICD-10-CM

## 2018-06-29 DIAGNOSIS — Z87891 Personal history of nicotine dependence: Secondary | ICD-10-CM

## 2018-06-29 DIAGNOSIS — J189 Pneumonia, unspecified organism: Secondary | ICD-10-CM | POA: Diagnosis present

## 2018-06-29 DIAGNOSIS — I482 Chronic atrial fibrillation, unspecified: Secondary | ICD-10-CM | POA: Diagnosis present

## 2018-06-29 DIAGNOSIS — Z7901 Long term (current) use of anticoagulants: Secondary | ICD-10-CM

## 2018-06-29 DIAGNOSIS — Z79899 Other long term (current) drug therapy: Secondary | ICD-10-CM | POA: Diagnosis not present

## 2018-06-29 DIAGNOSIS — E039 Hypothyroidism, unspecified: Secondary | ICD-10-CM | POA: Diagnosis present

## 2018-06-29 LAB — BASIC METABOLIC PANEL
Anion gap: 9 (ref 5–15)
BUN: 17 mg/dL (ref 8–23)
CHLORIDE: 92 mmol/L — AB (ref 98–111)
CO2: 27 mmol/L (ref 22–32)
Calcium: 8.6 mg/dL — ABNORMAL LOW (ref 8.9–10.3)
Creatinine, Ser: 0.63 mg/dL (ref 0.44–1.00)
GFR calc Af Amer: >60 mL/min (ref >60)
GFR calc non Af Amer: >60 mL/min (ref >60)
Glucose, Bld: 123 mg/dL — ABNORMAL HIGH (ref 70–99)
POTASSIUM: 3.7 mmol/L (ref 3.5–5.1)
SODIUM: 128 mmol/L — AB (ref 135–145)

## 2018-06-29 LAB — CBC
HCT: 35.3 % — ABNORMAL LOW (ref 36.0–46.0)
HEMOGLOBIN: 12 g/dL (ref 12.0–15.0)
MCH: 30.8 pg (ref 26.0–34.0)
MCHC: 34 g/dL (ref 30.0–36.0)
MCV: 90.7 fL (ref 80.0–100.0)
Platelets: 238 10*3/uL (ref 150–400)
RBC: 3.89 MIL/uL (ref 3.87–5.11)
RDW: 13.1 % (ref 11.5–15.5)
WBC: 19.4 10*3/uL — AB (ref 4.0–10.5)
nRBC: 0 % (ref 0.0–0.2)

## 2018-06-29 LAB — PROCALCITONIN: Procalcitonin: 0.66 ng/mL

## 2018-06-29 LAB — INFLUENZA PANEL BY PCR (TYPE A & B)
Influenza A By PCR: NEGATIVE
Influenza B By PCR: NEGATIVE

## 2018-06-29 LAB — TROPONIN I: Troponin I: 0.03 ng/mL (ref <0.03)

## 2018-06-29 IMAGING — CR DG CHEST 2V
1 series · 2 of 2 positions shown · non-contrast
Comparison: CT [DATE]

CLINICAL DATA: Chest pain

EXAM:
CHEST - 2 VIEW

[Series 1: dg chest 2 view · 0.14mm/px · 2 of 2 slices shown]
[im 1/2]
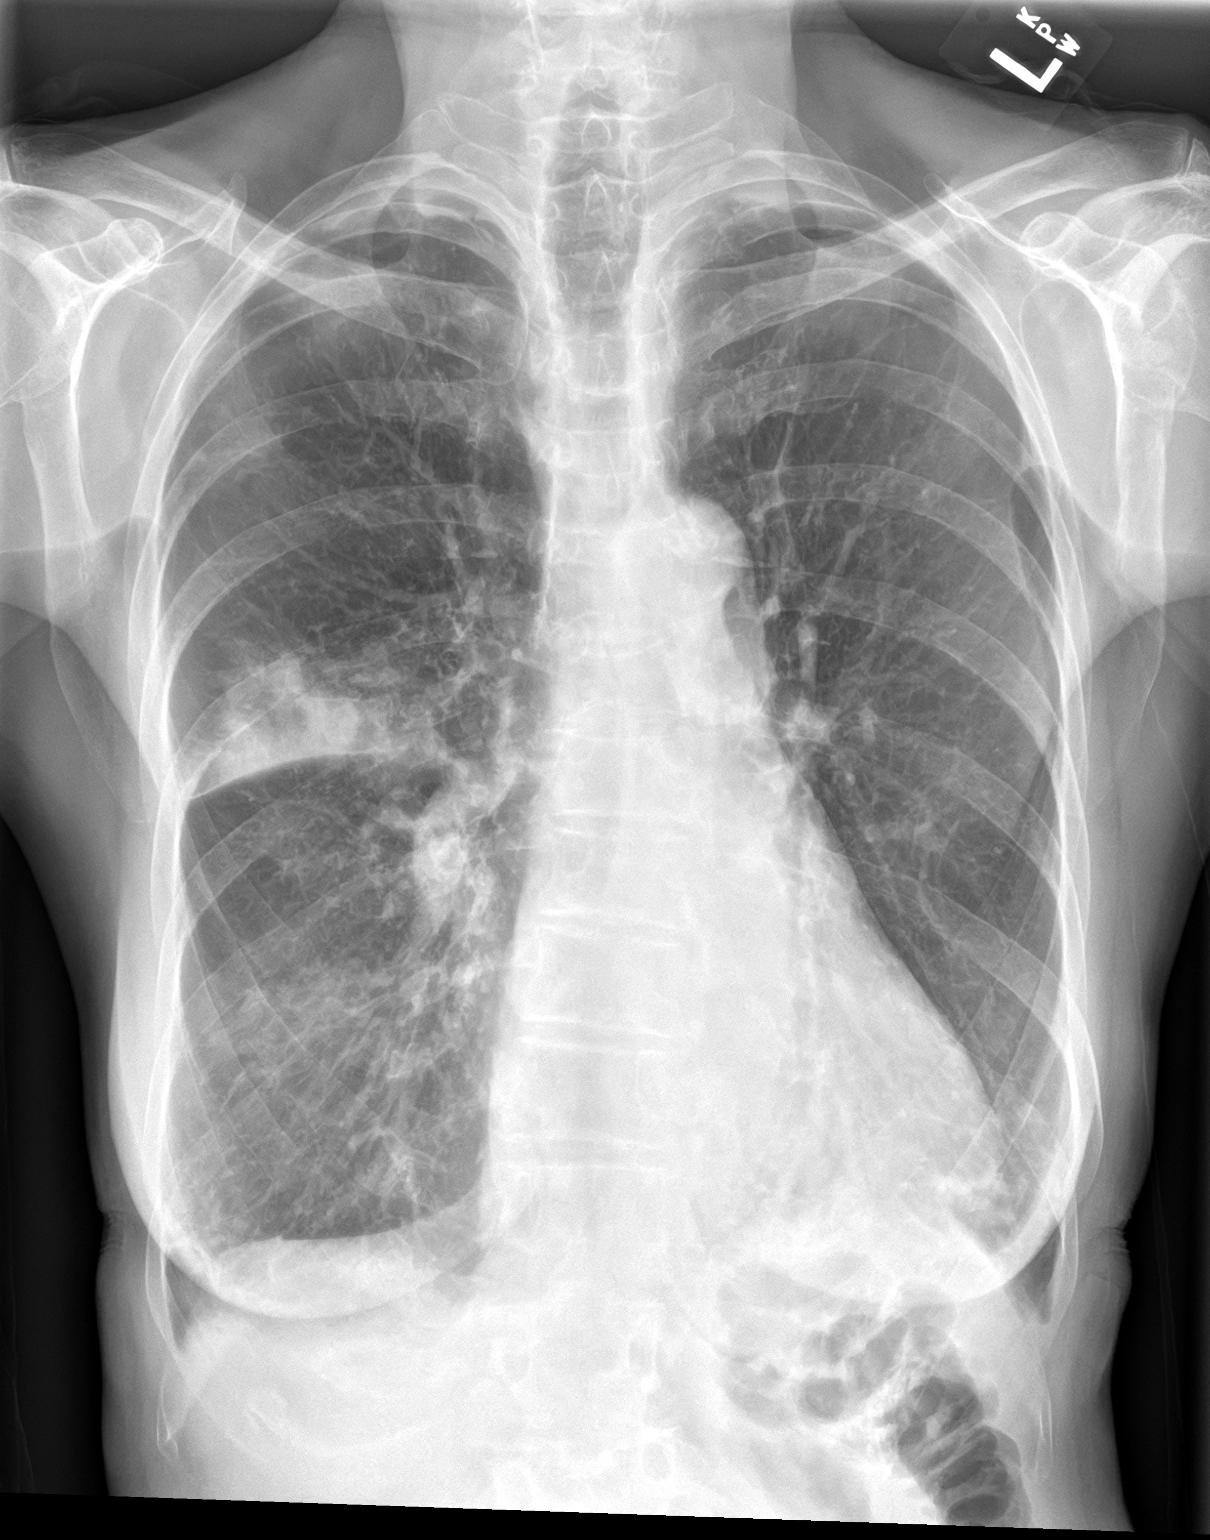
[im 2/2]
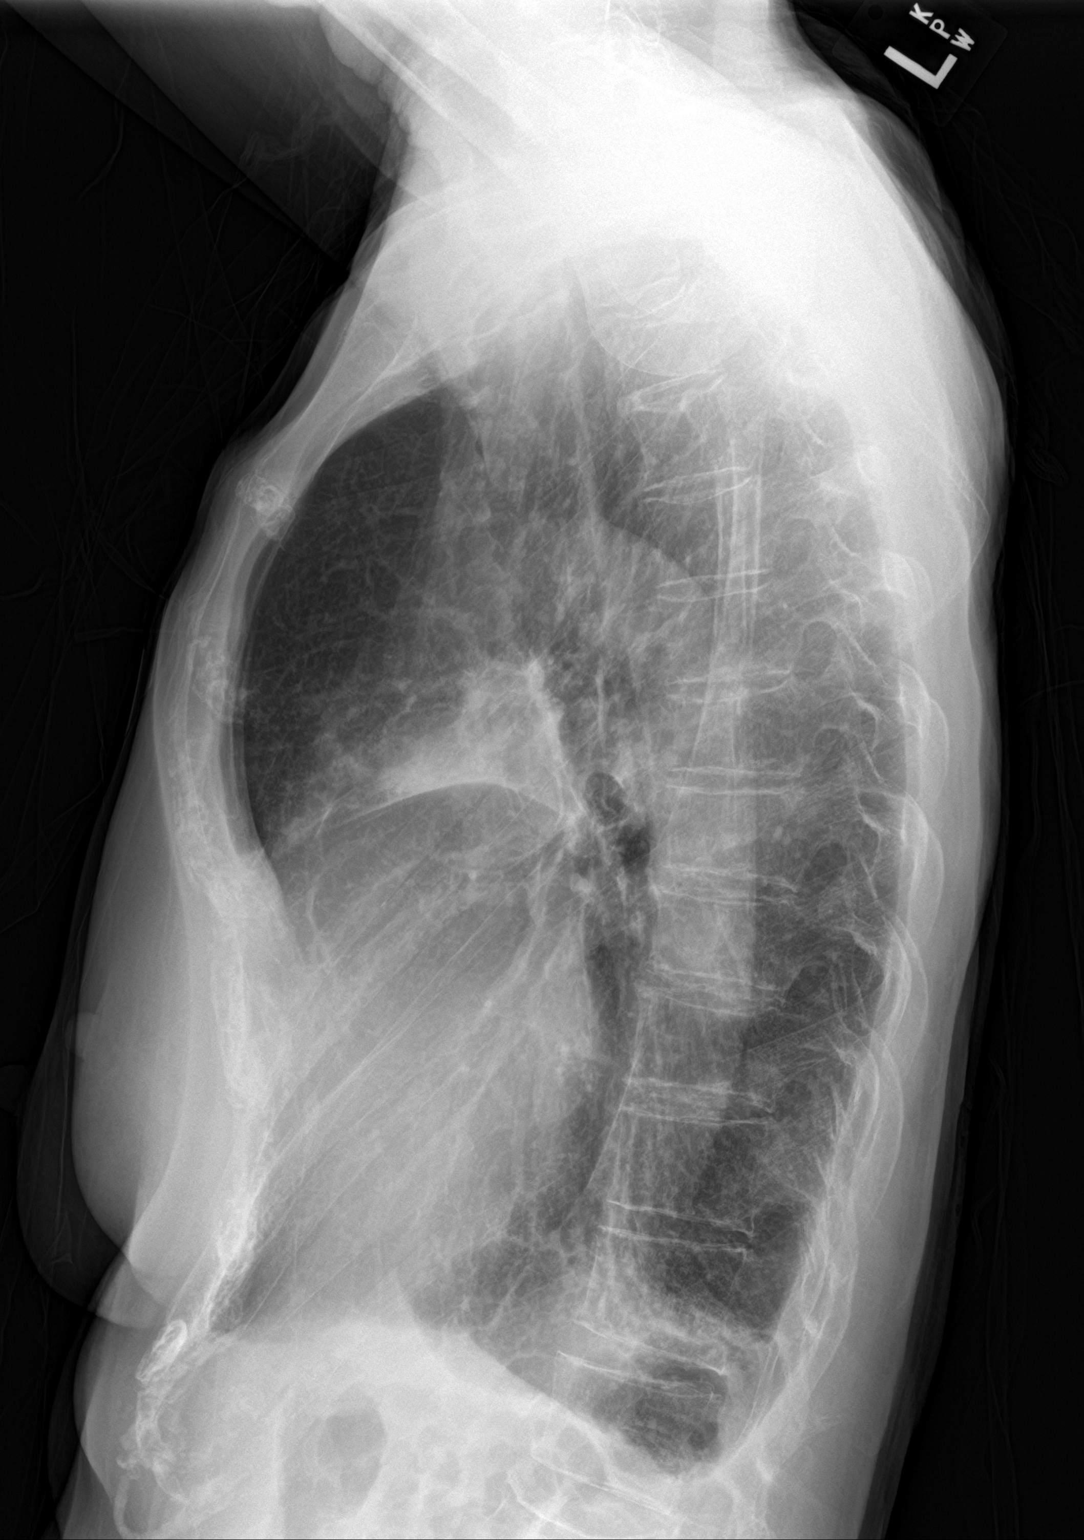

[2 of 2 positions shown; findings below may reference images not displayed]

FINDINGS: Hyperinflation. Trace pleural effusion or thickening. Focal airspace
disease in the right upper lobe and left greater than right lower
lobes. Normal heart size. Aortic atherosclerosis. No pneumothorax.
IMPRESSION: Multifocal airspace disease in the right upper lobe and left greater
than right lower lobe suspicious for multifocal pneumonia. Imaging
follow-up to resolution is recommended.

## 2018-06-29 MED ORDER — IBUPROFEN 400 MG PO TABS
600.0000 mg | ORAL_TABLET | Freq: Three times a day (TID) | ORAL | Status: DC | PRN
  Administered 2018-06-29: 600 mg via ORAL
  Filled 2018-06-29: qty 2

## 2018-06-29 MED ORDER — SODIUM CHLORIDE 0.9 % IV SOLN
500.0000 mg | INTRAVENOUS | Status: DC
  Filled 2018-06-29: qty 500

## 2018-06-29 MED ORDER — ACETAMINOPHEN 325 MG PO TABS
650.0000 mg | ORAL_TABLET | Freq: Four times a day (QID) | ORAL | Status: DC | PRN
  Administered 2018-06-30 (×3): 650 mg via ORAL
  Filled 2018-06-29 (×3): qty 2

## 2018-06-29 MED ORDER — SODIUM CHLORIDE 0.9 % IV SOLN
1.0000 g | INTRAVENOUS | Status: DC
  Administered 2018-06-30: 1 g via INTRAVENOUS
  Filled 2018-06-29 (×2): qty 10
  Filled 2018-06-29: qty 1

## 2018-06-29 MED ORDER — LEVOTHYROXINE SODIUM 50 MCG PO TABS
50.0000 ug | ORAL_TABLET | Freq: Every day | ORAL | Status: DC
  Administered 2018-06-30 – 2018-07-01 (×2): 50 ug via ORAL
  Filled 2018-06-29 (×2): qty 1

## 2018-06-29 MED ORDER — ONDANSETRON HCL 4 MG PO TABS: 4.0000 mg | ORAL_TABLET | Freq: Four times a day (QID) | ORAL | Status: DC | PRN

## 2018-06-29 MED ORDER — SODIUM CHLORIDE 0.9 % IV SOLN
500.0000 mg | Freq: Once | INTRAVENOUS | Status: AC
  Administered 2018-06-29: 500 mg via INTRAVENOUS
  Filled 2018-06-29: qty 500

## 2018-06-29 MED ORDER — ACETAMINOPHEN 500 MG PO TABS
1000.0000 mg | ORAL_TABLET | Freq: Once | ORAL | Status: AC
  Administered 2018-06-29: 1000 mg via ORAL

## 2018-06-29 MED ORDER — RIVAROXABAN 20 MG PO TABS
20.0000 mg | ORAL_TABLET | Freq: Every day | ORAL | Status: DC
  Administered 2018-06-29: 23:00:00 20 mg via ORAL
  Filled 2018-06-29 (×2): qty 1

## 2018-06-29 MED ORDER — VITAMIN E 180 MG (400 UNIT) PO CAPS
400.0000 [IU] | ORAL_CAPSULE | Freq: Every day | ORAL | Status: DC
  Administered 2018-06-30 – 2018-07-01 (×2): 400 [IU] via ORAL
  Filled 2018-06-29 (×3): qty 1

## 2018-06-29 MED ORDER — ADULT MULTIVITAMIN W/MINERALS CH
1.0000 | ORAL_TABLET | Freq: Every day | ORAL | Status: DC
  Administered 2018-06-30 – 2018-07-01 (×2): 1 via ORAL
  Filled 2018-06-29 (×2): qty 1

## 2018-06-29 MED ORDER — ONDANSETRON HCL 4 MG/2ML IJ SOLN: 4.0000 mg | Freq: Four times a day (QID) | INTRAMUSCULAR | Status: DC | PRN

## 2018-06-29 MED ORDER — SODIUM CHLORIDE 0.9 % IV SOLN
1.0000 g | Freq: Once | INTRAVENOUS | Status: AC
  Administered 2018-06-29: 1 g via INTRAVENOUS
  Filled 2018-06-29: qty 10

## 2018-06-29 MED ORDER — ACETAMINOPHEN 650 MG RE SUPP: 650.0000 mg | Freq: Four times a day (QID) | RECTAL | Status: DC | PRN

## 2018-06-29 MED ORDER — ACETAMINOPHEN 500 MG PO TABS
ORAL_TABLET | ORAL | Status: AC
  Filled 2018-06-29: qty 2

## 2018-06-29 MED ORDER — SODIUM CHLORIDE 0.9 % IV SOLN
INTRAVENOUS | Status: DC
  Administered 2018-06-29: 23:00:00 via INTRAVENOUS

## 2018-06-29 NOTE — ED Notes (Signed)
Report called to amanda rn floor nurse 

## 2018-06-29 NOTE — ED Notes (Signed)
ED Provider at bedside. 

## 2018-06-29 NOTE — ED Notes (Signed)
[facility]ed[facility] in with pt now 

## 2018-06-29 NOTE — ED Provider Notes (Signed)
[facility] Emergency Department Provider Note  ____________________________________________   I have reviewed the triage vital signs and the nursing notes.   HISTORY  Chief Complaint Weakness, fevers  History limited by: Not Limited   HPI Kaylee Santiago is a 83 y.o. female who presents to the emergency department today because of concern for weakness, fevers, decreased appetite and some chest discomfort. The patient states that the symptoms started last weekend. They have been persistent. The patient has been trying to manage the symptoms at home and has been trying to focus on drinking lots of water. Has had some associated nausea, did have one episode of vomiting. Her chest pain has been located primarily on the right side. It is worse with deep breaths. The patient states she did have her flu shot this year.    Per medical record review patient has a history of atrial fibrillation  Past Medical History:  Diagnosis Date  . Atrial fibrillation (HCC)     There are no active problems to display for this patient.   Past Surgical History:  Procedure Laterality Date  . BREAST EXCISIONAL BIOPSY Right 70's    Prior to Admission medications   Medication Sig Start Date End Date Taking? Authorizing Provider  Calcium Carbonate (CALCIUM 600 PO) Take 600 mg by mouth daily.    [provider]  ibuprofen (ADVIL,MOTRIN) 600 MG tablet Take 1 tablet (600 mg total) by mouth every 8 (eight) hours as needed for headache. 06/06/18   Emily FilbertWilliams, Jonathan E, MD  levothyroxine (SYNTHROID, LEVOTHROID) 50 MCG tablet Take 50 mcg by mouth daily. 07/25/17   [provider]  LORazepam (ATIVAN) 0.5 MG tablet Take 1 tablet (0.5 mg total) by mouth every 8 (eight) hours as needed for anxiety. 06/06/18 06/06/19  Emily FilbertWilliams, Jonathan E, MD  metoprolol succinate (TOPROL-XL) 50 MG 24 hr tablet Take 50 mg by mouth daily. 12/26/17   [provider]  Multiple Vitamin  (MULTI-VITAMIN DAILY PO) Take 1 tablet by mouth daily.    [provider]  rivaroxaban (XARELTO) 20 MG TABS tablet Take 20 mg by mouth daily. 07/25/17   [provider]  vitamin E 400 UNIT capsule Take 400 Units by mouth daily.    [provider]    Allergies Amoxicillin-pot clavulanate and Levofloxacin  Family History  Problem Relation Age of Onset  . Breast cancer Sister 9840  . Breast cancer Sister        4230's    Social History Social History   Tobacco Use  . Smoking status: Former Smoker    Types: Cigarettes  . Smokeless tobacco: Never Used  Substance Use Topics  . Alcohol use: Not Currently  . Drug use: Never    Review of Systems Constitutional: Positive for fevers.  Eyes: No visual changes. ENT: No sore throat. Cardiovascular: Positive for right sided chest pain. Respiratory: Denies shortness of breath. Gastrointestinal: No abdominal pain. Positive for nausea.  Genitourinary: Negative for dysuria. Musculoskeletal: Negative for back pain. Skin: Negative for rash. Neurological: Negative for headaches, focal weakness or numbness.  ____________________________________________   PHYSICAL EXAM:  VITAL SIGNS: ED Triage Vitals  Enc Vitals Group     BP 06/29/18 1647 (!) 162/80     Pulse Rate 06/29/18 1647 99     Resp 06/29/18 1647 19     Temp 06/29/18 1647 (!) 97.5 F (36.4 C)     Temp Source 06/29/18 1647 Oral     SpO2 06/29/18 1647 97 %  Weight 06/29/18 1648 120 lb (54.4 kg)     Height 06/29/18 1648 5\' 11"  (1.803 m)     Head Circumference --      Peak Flow --      Pain Score 06/29/18 1648 6   Constitutional: Alert and oriented.  Eyes: Conjunctivae are normal.  ENT      Head: Normocephalic and atraumatic.      Nose: No congestion/rhinnorhea.      Mouth/Throat: Mucous membranes are moist.      Neck: No stridor. Hematological/Lymphatic/Immunilogical: No cervical lymphadenopathy. Cardiovascular: Normal rate, regular rhythm.  No  murmurs, rubs, or gallops.  Respiratory: Normal respiratory effort without tachypnea nor retractions. Some rhonchi noted in bases.  Gastrointestinal: Soft and non tender. No rebound. No guarding.  Genitourinary: Deferred Musculoskeletal: Normal range of motion in all extremities. No lower extremity edema. Neurologic:  Normal speech and language. No gross focal neurologic deficits are appreciated.  Skin:  Skin is warm, dry and intact. No rash noted. Psychiatric: Mood and affect are normal. Speech and behavior are normal. Patient exhibits appropriate insight and judgment.  ____________________________________________    LABS (pertinent positives/negatives)  BMP na 128, cl 92, glu 123, cr 0.63 CBC wbc 19.4, hgb 12.0, plt 238 Trop 0.03 ____________________________________________   EKG  I, Phineas SemenGraydon Kimber Fritts, attending physician, personally viewed and interpreted this EKG  EKG Time: 1646 Rate: 101 Rhythm: atrial fibrillation Axis: normal Intervals: qtc 440 QRS: narrow, q waves v1, v2, v3 ST changes: no st elevation Impression: abnormal ekg  ____________________________________________    RADIOLOGY  CXR Concerning for multifocal pneumonia  ____________________________________________   PROCEDURES  Procedures  ____________________________________________   INITIAL IMPRESSION / ASSESSMENT AND PLAN / ED COURSE  Pertinent labs & imaging results that were available during my care of the patient were reviewed by me and considered in my medical decision making (see chart for details).   Patient presented with multiple medical complaints. ddx would be broad. Patient x-ray is concerning for pneumonia and patient does have an elevated white blood cell count.  Will start iv abx and plan on admission. Discussed findings with patient.   ____________________________________________   FINAL CLINICAL IMPRESSION(S) / ED DIAGNOSES  Final diagnoses:  Pneumonia due to infectious  organism, unspecified laterality, unspecified part of lung     Note: This dictation was prepared with Dragon dictation. Any transcriptional errors that result from this process are unintentional     Phineas SemenGoodman, Lexee Brashears, MD 06/29/18 2018

## 2018-06-29 NOTE — ED Notes (Addendum)
Pt reports feeling weak, nausea for past 3-4 days.  Pt had right side chest pain earlier today.  Pt denies sob.  Pt states it hurts to take a deep breath.    Pt has had decreased appetite.  Pt alert.  Speech clear.

## 2018-06-29 NOTE — ED Triage Notes (Signed)
[facility] with concerns over intermittent right sided chest pain. Pt states the pain only last for a few seconds and is a sharp feeling. Pt is more concerns over her generalized body aches, weakness, and dizziness that she was been experiencing for the last week. Pt states she attributed the symptoms to a virus but has not had a fever at home. Pt reports increased fluid intake but still remains "parched."

## 2018-06-29 NOTE — H&P (Signed)
[facility] [place] [facility]   PATIENT NAME: Kaylee Santiago    MR#:  [id number]  DATE OF BIRTH:  [birth year]  DATE OF ADMISSION:  06/29/2018  PRIMARY CARE PHYSICIAN: Lynnea FerrierKlein, Bert J III, MD   REQUESTING/REFERRING PHYSICIAN: Dr.Graydon Derrill KayGoodman  CHIEF COMPLAINT:   Chief Complaint  Patient presents with  . Chest Pain  . Dizziness    HISTORY OF PRESENT ILLNESS:  Kaylee Santiago  is a 83 y.o. female with a known history of A. fib on Xarelto, very independent at baseline presents to hospital secondary to feeling weak, decreased appetite and chest pain on coughing. Patient says that she started feeling weak for the last 4 days.  Denies any fevers but has had chills.  No nausea or vomiting.  No exposure to sick contacts.  No recent travel.  Initially she did not have cough or upper respiratory symptoms but for the last 2 days she has a dry cough and it hurts on the right side when she coughs and also the pain gets worse with deep breaths.  She had elevated WBC count in the ER and chest x-ray showing multifocal bilateral pneumonia.  She is being admitted for the same.  PAST MEDICAL HISTORY:   Past Medical History:  Diagnosis Date  . Atrial fibrillation (HCC)     PAST SURGICAL HISTORY:   Past Surgical History:  Procedure Laterality Date  . ABDOMINAL HYSTERECTOMY    . BREAST EXCISIONAL BIOPSY Right 70's    SOCIAL HISTORY:   Social History   Tobacco Use  . Smoking status: Former Smoker    Types: Cigarettes  . Smokeless tobacco: Never Used  Substance Use Topics  . Alcohol use: Not Currently    FAMILY HISTORY:   Family History  Problem Relation Age of Onset  . Breast cancer Sister 8340  . Breast cancer Sister        4330's  . CAD Mother     DRUG ALLERGIES:   Allergies  Allergen Reactions  . Amoxicillin-Pot Clavulanate Diarrhea  . Levofloxacin Diarrhea    REVIEW OF SYSTEMS:   Review of Systems  Constitutional: Positive for chills, fever and  malaise/fatigue. Negative for weight loss.  HENT: Negative for ear discharge, ear pain, hearing loss, nosebleeds and tinnitus.   Eyes: Negative for blurred vision, double vision and photophobia.  Respiratory: Positive for cough and shortness of breath. Negative for hemoptysis and wheezing.   Cardiovascular: Negative for chest pain, palpitations, orthopnea and leg swelling.  Gastrointestinal: Negative for abdominal pain, constipation, diarrhea, heartburn, melena, nausea and vomiting.  Genitourinary: Negative for dysuria, frequency, hematuria and urgency.  Musculoskeletal: Negative for back pain, myalgias and neck pain.  Skin: Negative for rash.  Neurological: Negative for dizziness, tingling, tremors, sensory change, speech change, focal weakness and headaches.  Endo/Heme/Allergies: Does not bruise/bleed easily.  Psychiatric/Behavioral: Negative for depression.    MEDICATIONS AT HOME:   Prior to Admission medications   Medication Sig Start Date End Date Taking? Authorizing Provider  Calcium Carbonate (CALCIUM 600 PO) Take 600 mg by mouth daily.   Yes [provider]  ibuprofen (ADVIL,MOTRIN) 600 MG tablet Take 1 tablet (600 mg total) by mouth every 8 (eight) hours as needed for headache. 06/06/18  Yes Emily FilbertWilliams, Jonathan E, MD  levothyroxine (SYNTHROID, LEVOTHROID) 50 MCG tablet Take 50 mcg by mouth daily. 07/25/17  Yes [provider]  metoprolol succinate (TOPROL-XL) 50 MG 24 hr tablet Take 25-50 mg by mouth daily. Take 50 mg in the  morning and 25 mg in the evening 12/26/17  Yes [provider]  Multiple Vitamin (MULTI-VITAMIN DAILY PO) Take 1 tablet by mouth daily.   Yes [provider]  rivaroxaban (XARELTO) 20 MG TABS tablet Take 20 mg by mouth daily. 07/25/17  Yes [provider]  vitamin E 400 UNIT capsule Take 400 Units by mouth daily.   Yes [provider]  LORazepam (ATIVAN) 0.5 MG tablet Take 1 tablet (0.5 mg total) by mouth every 8  (eight) hours as needed for anxiety. Patient not taking: Reported on 06/29/2018 06/06/18 06/06/19  Emily Filbert, MD      VITAL SIGNS:  Blood pressure (!) 162/80, pulse 99, temperature (!) 97.5 F (36.4 C), temperature source Oral, resp. rate 19, height 5\' 11"  (1.803 m), weight 54.4 kg, SpO2 97 %.  PHYSICAL EXAMINATION:   Physical Exam  GENERAL:  83 y.o.-year-old patient lying in the bed with no acute distress.  EYES: Pupils equal, round, reactive to light and accommodation. No scleral icterus. Extraocular muscles intact.  HEENT: Head atraumatic, normocephalic. Oropharynx and nasopharynx clear.  NECK:  Supple, no jugular venous distention. No thyroid enlargement, no tenderness.  LUNGS: Normal breath sounds bilaterally, no wheezing, rales,rhonchi or crepitation. No use of accessory muscles of respiration.  Decreased bibasilar breath sounds CARDIOVASCULAR: S1, S2 normal. No  rubs, or gallops.  2/6 systolic murmur is present ABDOMEN: Soft, nontender, nondistended. Bowel sounds present. No organomegaly or mass.  EXTREMITIES: No pedal edema, cyanosis, or clubbing.  NEUROLOGIC: Cranial nerves II through XII are intact. Muscle strength 5/5 in all extremities. Sensation intact. Gait not checked.  PSYCHIATRIC: The patient is alert and oriented x 3.  SKIN: No obvious rash, lesion, or ulcer.   LABORATORY PANEL:   CBC Recent Labs  Lab 06/29/18 1652  WBC 19.4*  HGB 12.0  HCT 35.3*  PLT 238   ------------------------------------------------------------------------------------------------------------------  Chemistries  Recent Labs  Lab 06/29/18 1652  NA 128*  K 3.7  CL 92*  CO2 27  GLUCOSE 123*  BUN 17  CREATININE 0.63  CALCIUM 8.6*   ------------------------------------------------------------------------------------------------------------------  Cardiac Enzymes Recent Labs  Lab 06/29/18 1652  TROPONINI 0.03*    ------------------------------------------------------------------------------------------------------------------  RADIOLOGY:  Dg Chest 2 View  Result Date: 06/29/2018 CLINICAL DATA:  Chest pain EXAM: CHEST - 2 VIEW COMPARISON:  CT 02/12/2015 FINDINGS: Hyperinflation. Trace pleural effusion or thickening. Focal airspace disease in the right upper lobe and left greater than right lower lobes. Normal heart size. Aortic atherosclerosis. No pneumothorax. IMPRESSION: Multifocal airspace disease in the right upper lobe and left greater than right lower lobe suspicious for multifocal pneumonia. Imaging follow-up to resolution is recommended. Electronically Signed   By: Jasmine Pang M.D.   On: 06/29/2018 17:44    EKG:   Orders placed or performed during the hospital encounter of 06/29/18  . EKG 12-Lead  . EKG 12-Lead  . ED EKG within 10 minutes  . ED EKG within 10 minutes    IMPRESSION AND PLAN:   Kaylee Santiago  is a 83 y.o. female with a known history of A. fib on Xarelto, very independent at baseline presents to hospital secondary to feeling weak, decreased appetite and chest pain on coughing.  1.  Bilateral multifocal pneumonia-blood cultures are pending -Chest x-ray showing right upper lobe and right lower lobe pneumonia.  Minimal on the left side -Started on Rocephin and azithromycin. -Not requiring oxygen here.  Continue to monitor  2.  A. Fib-chronic A. fib, well-controlled heart rate.  Not on any beta-blockers. -On Xarelto for anticoagulation  3.  Hypothyroidism-continue Synthroid  4.  DVT prophylaxis-on Xarelto  Patient is independent at baseline.  Please encourage ambulation while in the hospital.   All the records are reviewed and case discussed with ED provider. Management plans discussed with the patient, family and they are in agreement.  CODE STATUS: Full code  TOTAL TIME TAKING CARE OF THIS PATIENT: 50 minutes.    Enid Baasadhika Marlies Ligman M.D on 06/29/2018 at 9:15  PM  Between 7am to 6pm - Pager - [phone]  After 6pm go to [email or web address] - password [email or web address]  [facility] [place] [facility]  Office  [phone]  CC: Primary care physician; Lynnea FerrierKlein, Bert J III, MD

## 2018-06-30 LAB — BASIC METABOLIC PANEL
Anion gap: 6 (ref 5–15)
BUN: 15 mg/dL (ref 8–23)
CO2: 26 mmol/L (ref 22–32)
Calcium: 7.9 mg/dL — ABNORMAL LOW (ref 8.9–10.3)
Chloride: 95 mmol/L — ABNORMAL LOW (ref 98–111)
Creatinine, Ser: 0.52 mg/dL (ref 0.44–1.00)
GFR calc Af Amer: >60 mL/min (ref >60)
GFR calc non Af Amer: >60 mL/min (ref >60)
GLUCOSE: 139 mg/dL — AB (ref 70–99)
Potassium: 3.6 mmol/L (ref 3.5–5.1)
Sodium: 127 mmol/L — ABNORMAL LOW (ref 135–145)

## 2018-06-30 LAB — CBC
HEMATOCRIT: 31 % — AB (ref 36.0–46.0)
Hemoglobin: 10.4 g/dL — ABNORMAL LOW (ref 12.0–15.0)
MCH: 30.1 pg (ref 26.0–34.0)
MCHC: 33.5 g/dL (ref 30.0–36.0)
MCV: 89.9 fL (ref 80.0–100.0)
Platelets: 223 10*3/uL (ref 150–400)
RBC: 3.45 MIL/uL — ABNORMAL LOW (ref 3.87–5.11)
RDW: 13.1 % (ref 11.5–15.5)
WBC: 16.3 10*3/uL — ABNORMAL HIGH (ref 4.0–10.5)
nRBC: 0 % (ref 0.0–0.2)

## 2018-06-30 MED ORDER — METOPROLOL SUCCINATE ER 25 MG PO TB24
25.0000 mg | ORAL_TABLET | Freq: Every evening | ORAL | Status: DC
  Administered 2018-06-30: 25 mg via ORAL
  Filled 2018-06-30: qty 1

## 2018-06-30 MED ORDER — METOPROLOL SUCCINATE ER 25 MG PO TB24
50.0000 mg | ORAL_TABLET | Freq: Every day | ORAL | Status: DC
  Administered 2018-06-30 – 2018-07-01 (×2): 50 mg via ORAL
  Filled 2018-06-30 (×2): qty 2

## 2018-06-30 MED ORDER — RIVAROXABAN 15 MG PO TABS
15.0000 mg | ORAL_TABLET | Freq: Every day | ORAL | Status: DC
  Administered 2018-06-30: 15 mg via ORAL
  Filled 2018-06-30 (×2): qty 1

## 2018-06-30 MED ORDER — AZITHROMYCIN 500 MG PO TABS
500.0000 mg | ORAL_TABLET | Freq: Every day | ORAL | Status: DC
  Administered 2018-06-30: 500 mg via ORAL
  Filled 2018-06-30: qty 1

## 2018-06-30 NOTE — Care Management Note (Signed)
Case Management Note  Patient Details  Name: [name] MRN: [id number] Date of Birth: 07-12-1935  Subjective/Objective:                 Presents from home.  Admitted with pneumonia. Independent in her adls. Current with her pcp. No issues with transportation or paying for meds. Currently on room air.  Action/Plan:  Discussed need to mobilize patient to prevent decline in functional status during progressions rounds  Expected Discharge Date:                  Expected Discharge Plan:     In-House Referral:     Discharge planning Services     Post Acute Care Choice:    Choice offered to:     DME Arranged:    DME Agency:     HH Arranged:    HH Agency:     Status of Service:     If discussed at [employer] Meetings, dates discussed:    Additional Comments:  Eber HongGreene, Peony Barner R, RN 06/30/2018, 5:33 PM

## 2018-06-30 NOTE — Progress Notes (Signed)
[facility] [place] [facility]      PATIENT NAME: Kaylee Santiago    MR#:  [id number]  DATE OF BIRTH:  08-02-1934  SUBJECTIVE:   Patient presents to the hospital due to pleuritic chest pain, coughing and poor appetite and noted to have multifocal pneumonia.  Feels a bit better today.  Still having some pleuritic chest pain but improved since yesterday.  Fever has resolved, she denies any cough or worsening shortness of breath.  REVIEW OF SYSTEMS:    Review of Systems  Constitutional: Negative for chills and fever.  HENT: Negative for congestion and tinnitus.   Eyes: Negative for blurred vision and double vision.  Respiratory: Positive for cough. Negative for shortness of breath and wheezing.   Cardiovascular: Negative for chest pain, orthopnea and PND.  Gastrointestinal: Negative for abdominal pain, diarrhea, nausea and vomiting.  Genitourinary: Negative for dysuria and hematuria.  Neurological: Negative for dizziness, sensory change and focal weakness.  All other systems reviewed and are negative.   Nutrition: Heart Healthy Tolerating Diet: Yes Tolerating PT: ambulatory   DRUG ALLERGIES:   Allergies  Allergen Reactions  . Amoxicillin-Pot Clavulanate Diarrhea  . Levofloxacin Diarrhea    VITALS:  Blood pressure 130/71, pulse (!) 104, temperature (!) 97.5 F (36.4 C), temperature source Oral, resp. rate 18, height 5\' 11"  (1.803 m), weight 54.4 kg, SpO2 97 %.  PHYSICAL EXAMINATION:   Physical Exam  GENERAL:  83 y.o.-year-old patient lying in bed in no acute distress.  EYES: Pupils equal, round, reactive to light and accommodation. No scleral icterus. Extraocular muscles intact.  HEENT: Head atraumatic, normocephalic. Oropharynx and nasopharynx clear.  NECK:  Supple, no jugular venous distention. No thyroid enlargement, no tenderness.  LUNGS: Normal breath sounds bilaterally, no wheezing, rales, diffuse rhonchi. No use of accessory muscles of  respiration.  CARDIOVASCULAR: S1, S2 normal. No murmurs, rubs, or gallops.  ABDOMEN: Soft, nontender, nondistended. Bowel sounds present. No organomegaly or mass.  EXTREMITIES: No cyanosis, clubbing or edema b/l.    NEUROLOGIC: Cranial nerves II through XII are intact. No focal Motor or sensory deficits b/l.   PSYCHIATRIC: The patient is alert and oriented x 3.  SKIN: No obvious rash, lesion, or ulcer.    LABORATORY PANEL:   CBC Recent Labs  Lab 06/30/18 0418  WBC 16.3*  HGB 10.4*  HCT 31.0*  PLT 223   ------------------------------------------------------------------------------------------------------------------  Chemistries  Recent Labs  Lab 06/30/18 0418  NA 127*  K 3.6  CL 95*  CO2 26  GLUCOSE 139*  BUN 15  CREATININE 0.52  CALCIUM 7.9*   ------------------------------------------------------------------------------------------------------------------  Cardiac Enzymes Recent Labs  Lab 06/29/18 1652  TROPONINI 0.03*   ------------------------------------------------------------------------------------------------------------------  RADIOLOGY:  Dg Chest 2 View  Result Date: 06/29/2018 CLINICAL DATA:  Chest pain EXAM: CHEST - 2 VIEW COMPARISON:  CT 02/12/2015 FINDINGS: Hyperinflation. Trace pleural effusion or thickening. Focal airspace disease in the right upper lobe and left greater than right lower lobes. Normal heart size. Aortic atherosclerosis. No pneumothorax. IMPRESSION: Multifocal airspace disease in the right upper lobe and left greater than right lower lobe suspicious for multifocal pneumonia. Imaging follow-up to resolution is recommended. Electronically Signed   By: Jasmine PangKim  Fujinaga M.D.   On: 06/29/2018 17:44     ASSESSMENT AND PLAN:   83 year old female with past medical history of atrial fibrillation, who presents to the hospital due to cough, dizziness, weakness and noted to have multifocal pneumonia.  1.  Multifocal pneumonia- source of  patient's dizziness,  weakness and cough. - His chest x-ray was suggestive of right upper lobe and left greater than right lower lobe pneumonia. -Continue IV ceftriaxone, Zithromax.  Await culture results. - If not improving would consider getting a CT chest noncontrast.  2.  Leukocytosis-secondary to pneumonia.  Follow with IV antibiotic therapy.  3.  Hyponatremia-mild.  Suspected to be secondary to mild SIADH from underlying pneumonia. - Should equilibrate by itself.  Repeat sodium in the morning.  4.  History of chronic atrial fibrillation-continue Toprol. -Continue Xarelto.  5.  Hypothyroidism-continue Synthroid.     All the records are reviewed and case discussed with Care Management/Social Worker. Management plans discussed with the patient, family and they are in agreement.  CODE STATUS: Full code  DVT Prophylaxis: Xarelto  TOTAL TIME TAKING CARE OF THIS PATIENT: 30 minutes.   POSSIBLE D/C IN 1-2 DAYS, DEPENDING ON CLINICAL CONDITION.   Houston SirenSAINANI,Rhyann Berton J M.D on 06/30/2018 at 3:01 PM  Between 7am to 6pm - Pager - [phone]  After 6pm go to [email or web address] - Social research officer, governmentpassword [email or web address]  [facility] [place] [facility]  Office  [phone]  CC: Primary care physician; Lynnea FerrierKlein, Bert J III, MD

## 2018-06-30 NOTE — Consult Note (Signed)
ANTICOAGULATION CONSULT NOTE - Initial Consult  Pharmacy Consult for Xarelto (rivaroxaban) Dosing Indication: atrial fibrillation  Allergies  Allergen Reactions  . Amoxicillin-Pot Clavulanate Diarrhea  . Levofloxacin Diarrhea    Patient Measurements: Height: 5\' 11"  (180.3 cm) Weight: 120 lb (54.4 kg) IBW/kg (Calculated) : 70.8  Vital Signs: Temp: 97.5 F (36.4 C) (12/20 1251) Temp Source: Oral (12/20 1251) BP: 130/71 (12/20 1251) Pulse Rate: 104 (12/20 1251)  Labs: Recent Labs    06/29/18 1652 06/30/18 0418  HGB 12.0 10.4*  HCT 35.3* 31.0*  PLT 238 223  CREATININE 0.63 0.52  TROPONINI 0.03*  --     Estimated Creatinine Clearance: 47.4 mL/min (by C-G formula based on SCr of 0.52 mg/dL).   Medical History: Past Medical History:  Diagnosis Date  . Atrial fibrillation (HCC)     Medications:  Rivaroxaban 20mg  with supper  Assessment: Pharmacy consulted for rivaroxaban (Xarelto) dosing and monitoring in 83 yo female w/PMH of A. Fib. Patient prescribed 20mg  PTA. Calculated CrCl 4947ml/min.   Plan:  Current CrCl <4050mL/min. Will adjust rivaroxaban dose to 15mg  daily. Pharmacy will monitor renal function and adjust dose as needed.   Gardner CandleSheema M Trinidad Ingle, PharmD, BCPS Clinical Pharmacist 06/30/2018 12:56 PM

## 2018-07-01 LAB — CBC
HCT: 35.4 % — ABNORMAL LOW (ref 36.0–46.0)
Hemoglobin: 11.4 g/dL — ABNORMAL LOW (ref 12.0–15.0)
MCH: 29.8 pg (ref 26.0–34.0)
MCHC: 32.2 g/dL (ref 30.0–36.0)
MCV: 92.7 fL (ref 80.0–100.0)
Platelets: 279 10*3/uL (ref 150–400)
RBC: 3.82 MIL/uL — ABNORMAL LOW (ref 3.87–5.11)
RDW: 13.2 % (ref 11.5–15.5)
WBC: 8.8 10*3/uL (ref 4.0–10.5)
nRBC: 0 % (ref 0.0–0.2)

## 2018-07-01 LAB — BASIC METABOLIC PANEL
ANION GAP: 6 (ref 5–15)
BUN: 9 mg/dL (ref 8–23)
CO2: 28 mmol/L (ref 22–32)
Calcium: 8.3 mg/dL — ABNORMAL LOW (ref 8.9–10.3)
Chloride: 98 mmol/L (ref 98–111)
Creatinine, Ser: 0.66 mg/dL (ref 0.44–1.00)
GFR calc non Af Amer: >60 mL/min (ref >60)
Glucose, Bld: 147 mg/dL — ABNORMAL HIGH (ref 70–99)
Potassium: 3.7 mmol/L (ref 3.5–5.1)
SODIUM: 132 mmol/L — AB (ref 135–145)

## 2018-07-01 MED ORDER — CEFDINIR 300 MG PO CAPS
300.0000 mg | ORAL_CAPSULE | Freq: Two times a day (BID) | ORAL | Status: DC
  Filled 2018-07-01 (×2): qty 1

## 2018-07-01 MED ORDER — RISAQUAD PO CAPS
1.0000 | ORAL_CAPSULE | Freq: Every day | ORAL | Status: DC
  Filled 2018-07-01: qty 1

## 2018-07-01 MED ORDER — RISAQUAD PO CAPS: 1.0000 | ORAL_CAPSULE | Freq: Every day | ORAL | 0 refills | Status: DC

## 2018-07-01 MED ORDER — CEFDINIR 300 MG PO CAPS: 300.0000 mg | ORAL_CAPSULE | Freq: Two times a day (BID) | ORAL | 0 refills | Status: DC

## 2018-07-01 MED ORDER — AZITHROMYCIN 250 MG PO TABS: 250.0000 mg | ORAL_TABLET | Freq: Every day | ORAL | 0 refills | Status: DC

## 2018-07-01 NOTE — Discharge Summary (Signed)
[facility] - [place] [facility]   PATIENT NAME: Kaylee Santiago    MR#:  [id number]  DATE OF BIRTH:  22-Aug-1934  DATE OF ADMISSION:  06/29/2018 ADMITTING PHYSICIAN: Enid Baas, MD  DATE OF DISCHARGE: 07/01/2018  PRIMARY CARE PHYSICIAN: Curtis Sites III, MD    ADMISSION DIAGNOSIS:  Pneumonia due to infectious organism, unspecified laterality, unspecified part of lung [J18.9]  DISCHARGE DIAGNOSIS:  Pneumonia  SECONDARY DIAGNOSIS:   Past Medical History:  Diagnosis Date  . Atrial fibrillation [facility])     HOSPITAL COURSE:  83 year old female with past medical history of atrial fibrillation, who presents to the hospital due to cough, dizziness, weakness and noted to have multifocal pneumonia.  1.  Multifocal pneumonia- source of patient's dizziness, weakness and cough. - His chest x-ray was suggestive of right upper lobe and left greater than right lower lobe pneumonia. -Continue IV ceftriaxone, Zithromax.  No  cultures were sent. -wbc 16K--8K, afebrile, feels good  2.  Leukocytosis-secondary to pneumonia.  Follow with IV antibiotic therapy.  3.  Hyponatremia-mild.  Suspected to be secondary to mild SIADH from underlying pneumonia. - Should equilibrate by itself.  Repeat sodium in the morning--upto 132  4.  History of chronic atrial fibrillation-continue Toprol. -Continue Xarelto.  5.  Hypothyroidism-continue Synthroid.  overall feels better. D/c home CONSULTS OBTAINED:    DRUG ALLERGIES:   Allergies  Allergen Reactions  . Amoxicillin-Pot Clavulanate Diarrhea  . Levofloxacin Diarrhea    DISCHARGE MEDICATIONS:   Allergies as of 07/01/2018      Reactions   Amoxicillin-pot Clavulanate Diarrhea   Levofloxacin Diarrhea      Medication List    STOP taking these medications   LORazepam 0.5 MG tablet Commonly known as:  ATIVAN     TAKE these medications   acidophilus Caps capsule Take 1 capsule by mouth daily.    azithromycin 250 MG tablet Commonly known as:  ZITHROMAX Take 1 tablet (250 mg total) by mouth daily.   CALCIUM 600 PO Take 600 mg by mouth daily.   cefdinir 300 MG capsule Commonly known as:  OMNICEF Take 1 capsule (300 mg total) by mouth every 12 (twelve) hours.   ibuprofen 600 MG tablet Commonly known as:  ADVIL,MOTRIN Take 1 tablet (600 mg total) by mouth every 8 (eight) hours as needed for headache.   levothyroxine 50 MCG tablet Commonly known as:  SYNTHROID, LEVOTHROID Take 50 mcg by mouth daily.   metoprolol succinate 50 MG 24 hr tablet Commonly known as:  TOPROL-XL Take 25-50 mg by mouth daily. Take 50 mg in the morning and 25 mg in the evening   MULTI-VITAMIN DAILY PO Take 1 tablet by mouth daily.   vitamin E 400 UNIT capsule Take 400 Units by mouth daily.   XARELTO 20 MG Tabs tablet Generic drug:  rivaroxaban Take 20 mg by mouth daily.       If you experience worsening of your admission symptoms, develop shortness of breath, life threatening emergency, suicidal or homicidal thoughts you must seek medical attention immediately by calling 911 or calling your MD immediately  if symptoms less severe.  You Must read complete instructions/literature along with all the possible adverse reactions/side effects for all the Medicines you take and that have been prescribed to you. Take any new Medicines after you have completely understood and accept all the possible adverse reactions/side effects.   Please note  You were cared for by a hospitalist during your hospital stay. If you have  any questions about your discharge medications or the care you received while you were in the hospital after you are discharged, you can call the unit and asked to speak with the hospitalist on call if the hospitalist that took care of you is not available. Once you are discharged, your primary care physician will handle any further medical issues. Please note that NO REFILLS for any  discharge medications will be authorized once you are discharged, as it is imperative that you return to your primary care physician (or establish a relationship with a primary care physician if you do not have one) for your aftercare needs so that they can reassess your need for medications and monitor your lab values. Today   SUBJECTIVE   Doing well  VITAL SIGNS:  Blood pressure (!) 151/82, pulse 89, temperature 98.1 F (36.7 C), temperature source Oral, resp. rate 18, height 5\' 11"  (1.803 m), weight 54.4 kg, SpO2 94 %.  I/O:    Intake/Output Summary (Last 24 hours) at 07/01/2018 1410 Last data filed at 07/01/2018 1404 Gross per 24 hour  Intake 580 ml  Output -  Net 580 ml    PHYSICAL EXAMINATION:  GENERAL:  83 y.o.-year-old patient lying in the bed with no acute distress.  EYES: Pupils equal, round, reactive to light and accommodation. No scleral icterus. Extraocular muscles intact.  HEENT: Head atraumatic, normocephalic. Oropharynx and nasopharynx clear.  NECK:  Supple, no jugular venous distention. No thyroid enlargement, no tenderness.  LUNGS: Normal breath sounds bilaterally, no wheezing, rales,rhonchi or crepitation. No use of accessory muscles of respiration.  CARDIOVASCULAR: S1, S2 normal. No murmurs, rubs, or gallops.  ABDOMEN: Soft, non-tender, non-distended. Bowel sounds present. No organomegaly or mass.  EXTREMITIES: No pedal edema, cyanosis, or clubbing.  NEUROLOGIC: Cranial nerves II through XII are intact. Muscle strength 5/5 in all extremities. Sensation intact. Gait not checked.  PSYCHIATRIC: The patient is alert and oriented x 3.  SKIN: No obvious rash, lesion, or ulcer.   DATA REVIEW:   CBC  Recent Labs  Lab 07/01/18 0519  WBC 8.8  HGB 11.4*  HCT 35.4*  PLT 279    Chemistries  Recent Labs  Lab 07/01/18 0519  NA 132*  K 3.7  CL 98  CO2 28  GLUCOSE 147*  BUN 9  CREATININE 0.66  CALCIUM 8.3*    Microbiology Results   No results found  for this or any previous visit (from the past 240 hour(s)).  RADIOLOGY:  Dg Chest 2 View  Result Date: 06/29/2018 CLINICAL DATA:  Chest pain EXAM: CHEST - 2 VIEW COMPARISON:  CT 02/12/2015 FINDINGS: Hyperinflation. Trace pleural effusion or thickening. Focal airspace disease in the right upper lobe and left greater than right lower lobes. Normal heart size. Aortic atherosclerosis. No pneumothorax. IMPRESSION: Multifocal airspace disease in the right upper lobe and left greater than right lower lobe suspicious for multifocal pneumonia. Imaging follow-up to resolution is recommended. Electronically Signed   By: Jasmine PangKim  Fujinaga M.D.   On: 06/29/2018 17:44     Management plans discussed with the patient, family and they are in agreement.  CODE STATUS:     Code Status Orders  (From admission, onward)         Start     Ordered   06/29/18 2141  Full code  Continuous     06/29/18 2140        Code Status History    This patient has a current code status but no historical code status.  Advance Directive Documentation     Most Recent Value  Type of Advance Directive  Living will  Pre-existing out of facility DNR order (yellow form or pink MOST form)  -  "MOST" Form in Place?  -      TOTAL TIME TAKING CARE OF THIS PATIENT: 40 minutes.    Enedina FinnerSona Del Wiseman M.D on 07/01/2018 at 2:10 PM  Between 7am to 6pm - Pager - [phone] After 6pm go to [email or web address] - password [email or web address]  [facility]  Office  [phone]  CC: Primary care physician; Lynnea FerrierKlein, Bert J III, MD

## 2018-07-01 NOTE — Progress Notes (Signed)
[facility]; verbally reviewed AVS with pt, gave handwritten Rxs to pt; no questions voiced at this time; pt to use the nurse call bell when she is dressed and ready to go

## 2018-08-21 ENCOUNTER — Other Ambulatory Visit: Payer: Self-pay | Admitting: Internal Medicine

## 2018-08-21 DIAGNOSIS — Z1231 Encounter for screening mammogram for malignant neoplasm of breast: Secondary | ICD-10-CM

## 2018-12-13 ENCOUNTER — Other Ambulatory Visit: Payer: Self-pay | Admitting: Physician Assistant

## 2018-12-13 ENCOUNTER — Other Ambulatory Visit: Payer: Self-pay

## 2018-12-13 ENCOUNTER — Ambulatory Visit
Admission: RE | Admit: 2018-12-13 | Discharge: 2018-12-13 | Disposition: A | Discharge status: Home / Self Care | Payer: Medicare Other | Source: Ambulatory Visit | Attending: Physician Assistant | Admitting: Physician Assistant

## 2018-12-13 DIAGNOSIS — S32010A Wedge compression fracture of first lumbar vertebra, initial encounter for closed fracture: Secondary | ICD-10-CM

## 2018-12-13 IMAGING — MR MRI LUMBAR SPINE WITHOUT CONTRAST
4 of 5 series · 34 of 48 positions shown · non-contrast
Comparison: None.

CLINICAL DATA: Low back pain for 1 week since a twisting injury.

EXAM:
MRI LUMBAR SPINE WITHOUT CONTRAST
TECHNIQUE: Multiplanar, multisequence MR imaging of the lumbar spine was
performed. No intravenous contrast was administered.

[Series 5: T2 · sagittal · 4.0mm · 0.81mm/px · 8 of 19 slices shown (1 of 2)]
[im 1/19]
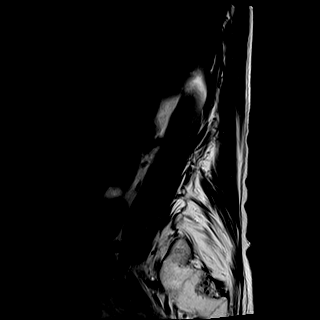
[im 3/19]
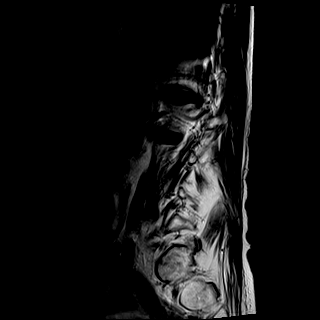
[im 6/19]
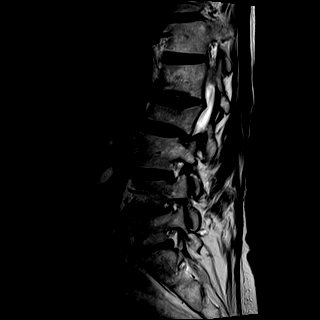
[im 8/19]
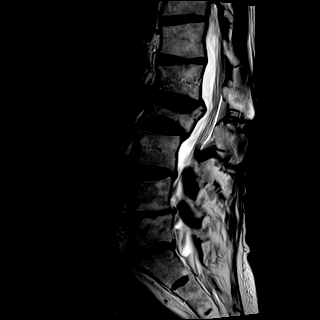
[im 11/19]
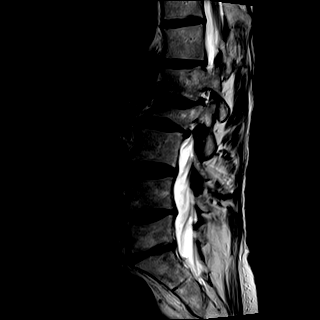
[im 13/19]
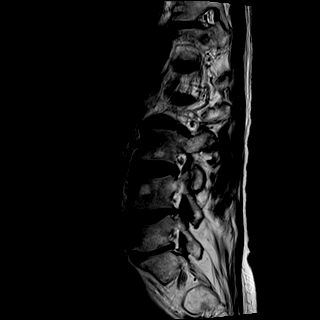
[im 16/19]
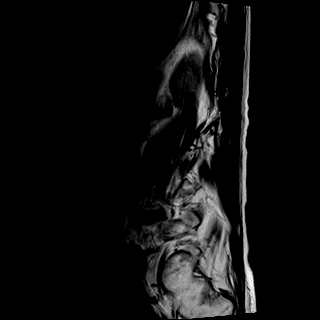
[im 19/19]
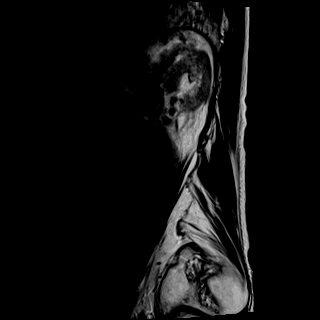

[Series 6: T1 · sagittal · 4.0mm · 0.81mm/px · 8 of 19 slices shown (1 of 2)]
[im 1/19]
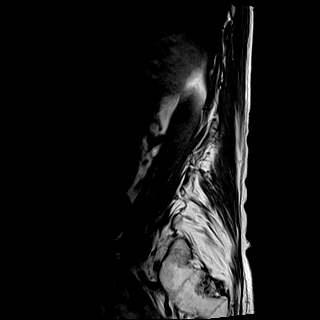
[im 3/19]
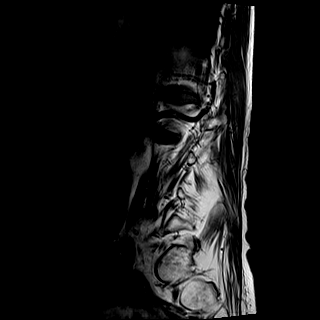
[im 6/19]
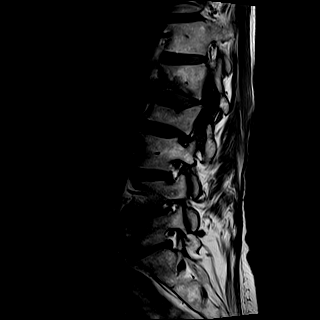
[im 8/19]
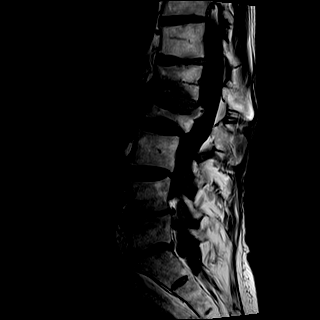
[im 11/19]
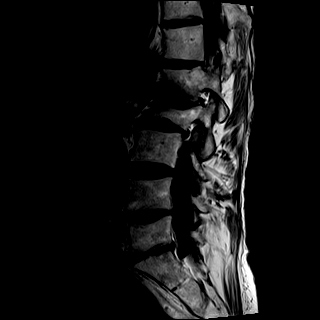
[im 13/19]
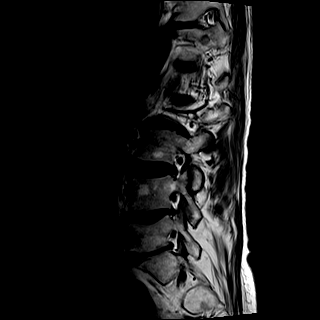
[im 16/19]
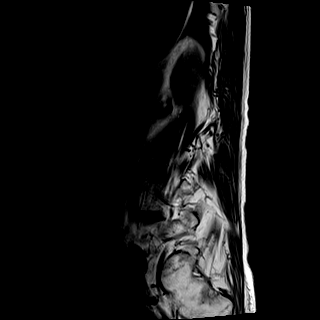
[im 19/19]
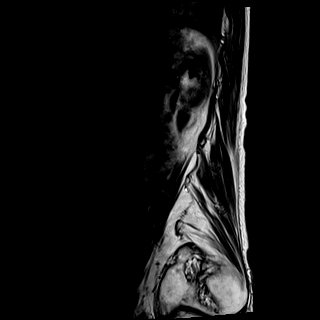

[Series 8: T2 · axial · 4.0mm · 0.78mm/px · z∈[-105,+56]mm · 9 of 28 slices shown (2 of 2)]
[im 1/28]
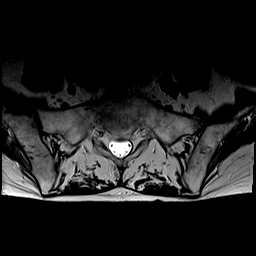
[im 5/28]
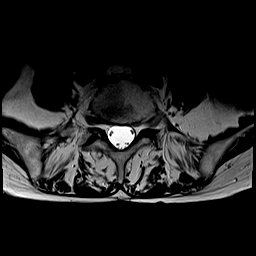
[im 8/28]
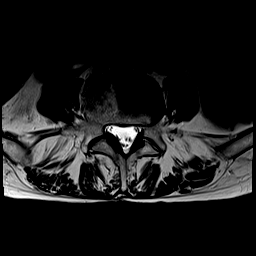
[im 13/28]
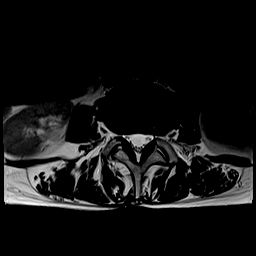
[im 15/28]
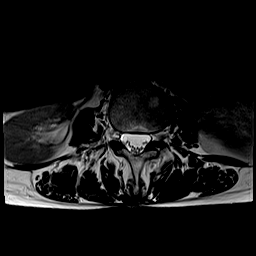
[im 20/28]
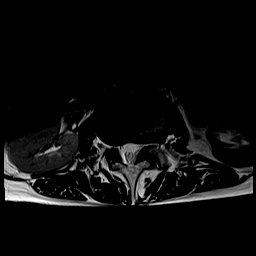
[im 23/28]
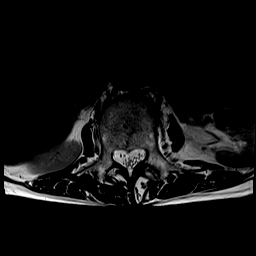
[im 25/28]
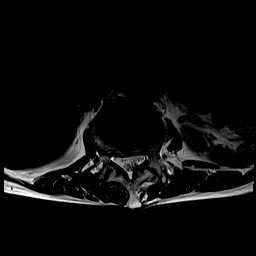
[im 28/28]
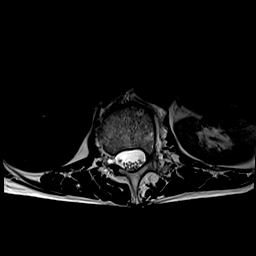

[Series 9: T1 · axial · 4.0mm · 0.39mm/px · z∈[-105,+56]mm · 9 of 28 slices shown (2 of 2)]
[im 1/28]
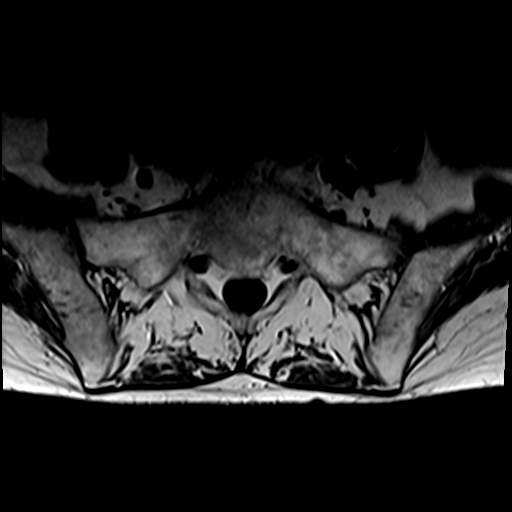
[im 5/28]
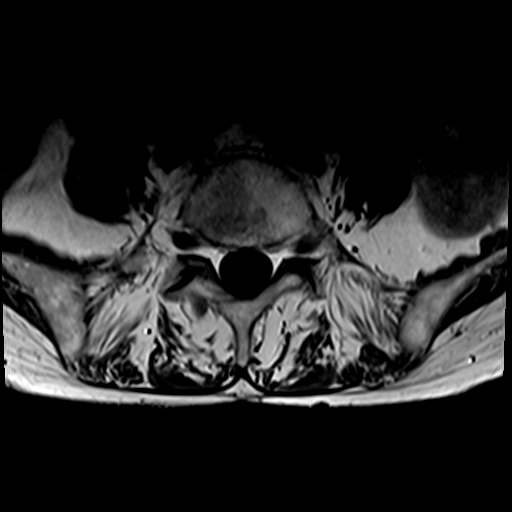
[im 8/28]
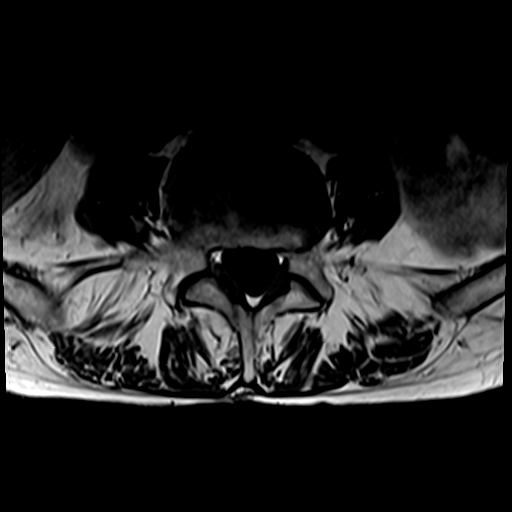
[im 13/28]
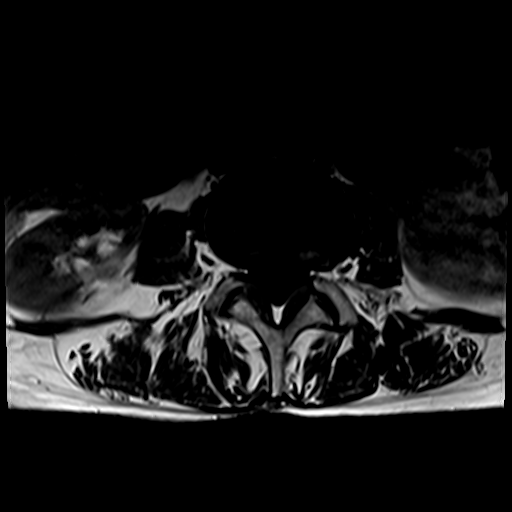
[im 15/28]
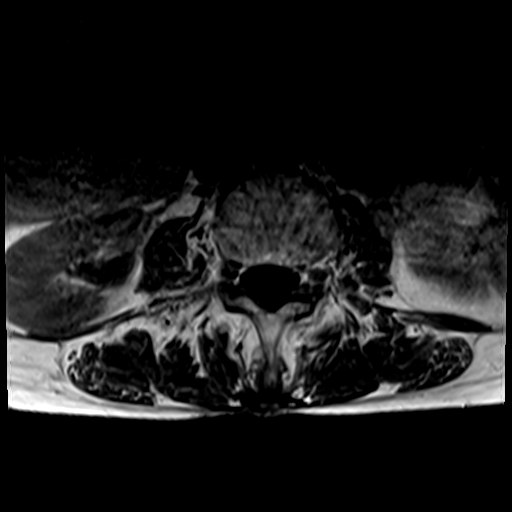
[im 20/28]
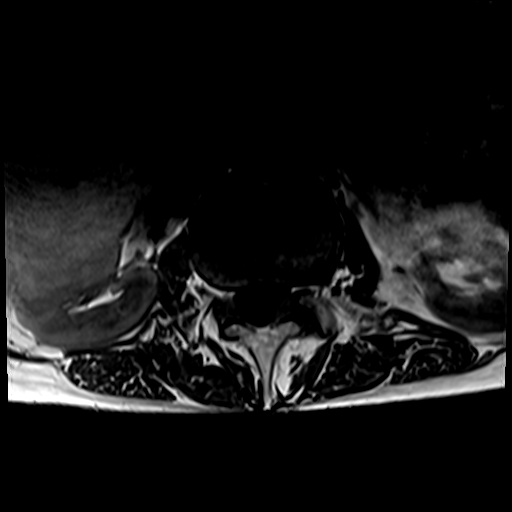
[im 23/28]
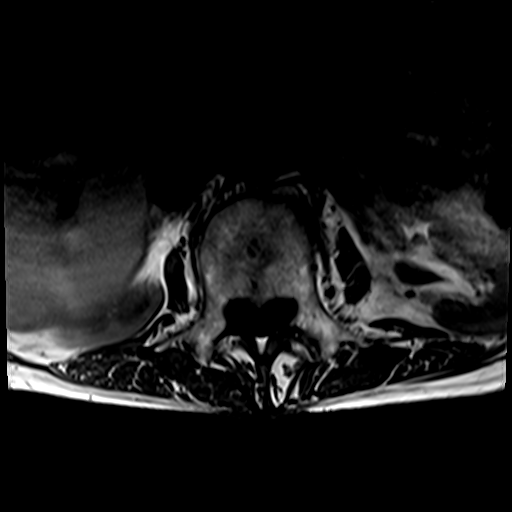
[im 25/28]
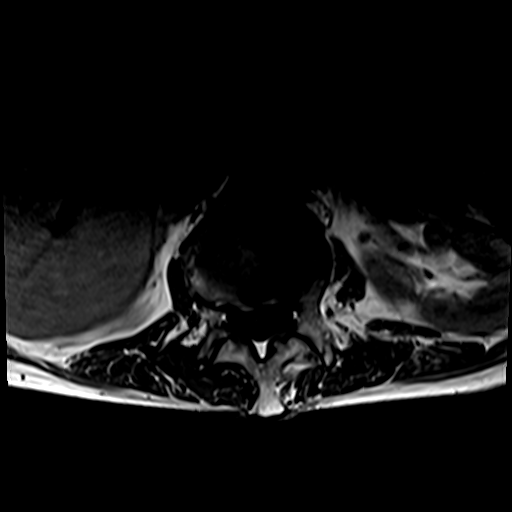
[im 28/28]
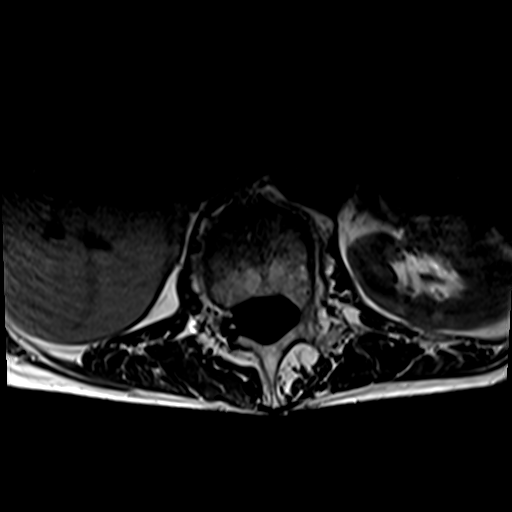

[34 of 48 positions shown; findings below may reference images not displayed]

FINDINGS: Segmentation:  Standard.

Alignment: Mild convex right scoliosis is noted. 0.4 cm
retrolisthesis L2 on L3.

Vertebrae: The patient has an inferior endplate compression fracture
of L1 with vertebral body height loss of up to 15% anteriorly.
Associated marrow edema is identified. No involvement of the
posterior elements or retropulsion off the inferior endplate of L1.
Remote compression fracture deformity of L2 with vertebral body
height loss of up to 90% is also seen. There is some degenerative
endplate signal change eccentric to the right at L4-5.

Conus medullaris and cauda equina: Conus extends to the L1 level.
Conus and cauda equina appear normal.

Paraspinal and other soft tissues: Negative.

Disc levels:

T11-12 and T12-L1 are imaged in the sagittal plane only. Minimal
disc bulging is present at both levels without stenosis.

L1-2: Shallow disc bulge and bony retropulsion off the superior
endplate of L2 cause mild central canal narrowing. The foramina are
open.

L2-3: There is a shallow disc bulge slightly more prominent to the
left. No stenosis.

L3-4: Negative.

L4-5: Very shallow left subarticular recess protrusion without
stenosis.

L5-S1: Annular fissure and tiny disc bulge without stenosis.
IMPRESSION: The examination is positive for an acute or subacute inferior
endplate compression fracture of L1 with vertebral body height loss
of up to 15%. No bony retropulsion or involvement of the posterior
elements.

Remote L2 compression fracture. There is bony retropulsion off the
superior endplate of L2 but only mild central canal stenosis at
L1-2.

## 2018-12-14 ENCOUNTER — Other Ambulatory Visit: Payer: Self-pay | Admitting: Physician Assistant

## 2018-12-14 DIAGNOSIS — S32010A Wedge compression fracture of first lumbar vertebra, initial encounter for closed fracture: Secondary | ICD-10-CM

## 2018-12-19 ENCOUNTER — Encounter
Admission: RE | Admit: 2018-12-19 | Discharge: 2018-12-19 | Disposition: A | Discharge status: Home / Self Care | Payer: Medicare Other | Source: Ambulatory Visit | Attending: Orthopedic Surgery | Admitting: Orthopedic Surgery

## 2018-12-19 ENCOUNTER — Other Ambulatory Visit: Payer: Self-pay

## 2018-12-19 DIAGNOSIS — E039 Hypothyroidism, unspecified: Secondary | ICD-10-CM | POA: Diagnosis not present

## 2018-12-19 DIAGNOSIS — Z79899 Other long term (current) drug therapy: Secondary | ICD-10-CM | POA: Diagnosis not present

## 2018-12-19 DIAGNOSIS — I10 Essential (primary) hypertension: Secondary | ICD-10-CM | POA: Diagnosis not present

## 2018-12-19 DIAGNOSIS — I4891 Unspecified atrial fibrillation: Secondary | ICD-10-CM | POA: Diagnosis not present

## 2018-12-19 DIAGNOSIS — Z7901 Long term (current) use of anticoagulants: Secondary | ICD-10-CM | POA: Diagnosis not present

## 2018-12-19 DIAGNOSIS — S32010A Wedge compression fracture of first lumbar vertebra, initial encounter for closed fracture: Secondary | ICD-10-CM | POA: Diagnosis not present

## 2018-12-19 DIAGNOSIS — Z1159 Encounter for screening for other viral diseases: Secondary | ICD-10-CM | POA: Diagnosis not present

## 2018-12-19 DIAGNOSIS — X58XXXA Exposure to other specified factors, initial encounter: Secondary | ICD-10-CM | POA: Diagnosis not present

## 2018-12-19 DIAGNOSIS — Z87891 Personal history of nicotine dependence: Secondary | ICD-10-CM | POA: Diagnosis not present

## 2018-12-19 DIAGNOSIS — Z79890 Hormone replacement therapy (postmenopausal): Secondary | ICD-10-CM | POA: Diagnosis not present

## 2018-12-19 HISTORY — DX: Essential (primary) hypertension: I10

## 2018-12-19 HISTORY — DX: Cardiac arrhythmia, unspecified: I49.9

## 2018-12-19 HISTORY — DX: Dorsalgia, unspecified: M54.9

## 2018-12-19 HISTORY — DX: Hypothyroidism, unspecified: E03.9

## 2018-12-19 LAB — CBC
HCT: 37.6 % (ref 36.0–46.0)
Hemoglobin: 12.4 g/dL (ref 12.0–15.0)
MCH: 30.2 pg (ref 26.0–34.0)
MCHC: 33 g/dL (ref 30.0–36.0)
MCV: 91.7 fL (ref 80.0–100.0)
Platelets: 280 10*3/uL (ref 150–400)
RBC: 4.1 MIL/uL (ref 3.87–5.11)
RDW: 13 % (ref 11.5–15.5)
WBC: 7.7 10*3/uL (ref 4.0–10.5)
nRBC: 0 % (ref 0.0–0.2)

## 2018-12-19 LAB — BASIC METABOLIC PANEL
Anion gap: 9 (ref 5–15)
BUN: 20 mg/dL (ref 8–23)
CO2: 30 mmol/L (ref 22–32)
Calcium: 9.1 mg/dL (ref 8.9–10.3)
Chloride: 95 mmol/L — ABNORMAL LOW (ref 98–111)
Creatinine, Ser: 0.64 mg/dL (ref 0.44–1.00)
GFR calc Af Amer: >60 mL/min (ref >60)
GFR calc non Af Amer: >60 mL/min (ref >60)
Glucose, Bld: 75 mg/dL (ref 70–99)
Potassium: 4.1 mmol/L (ref 3.5–5.1)
Sodium: 134 mmol/L — ABNORMAL LOW (ref 135–145)

## 2018-12-19 LAB — SARS CORONAVIRUS 2 BY RT PCR (HOSPITAL ORDER, PERFORMED IN ~~LOC~~ HOSPITAL LAB): SARS Coronavirus 2: NEGATIVE

## 2018-12-19 NOTE — Patient Instructions (Signed)
Your procedure is scheduled on: 12/21/2018 Thurs Report to Same Day Surgery 2nd floor medical mall [facility] Entrance-take elevator on left to 2nd floor.  Check in with surgery information desk.) To find out your arrival time please call [phone] between 1PM - 3PM on 12/20/2018 Wed  Remember: Instructions that are not followed completely may result in serious medical risk, up to and including death, or upon the discretion of your surgeon and anesthesiologist your surgery may need to be rescheduled.    _x___ 1. Do not eat food after midnight the night before your procedure. You may drink clear liquids up to 2 hours before you are scheduled to arrive at the hospital for your procedure.  Do not drink clear liquids within 2 hours of your scheduled arrival to the hospital.  Clear liquids include  --Water or Apple juice without pulp  --Clear carbohydrate beverage such as ClearFast or Gatorade  --Black Coffee or Clear Tea (No milk, no creamers, do not add anything to                  the coffee or Tea Type 1 and type 2 diabetics should only drink water.   ____Ensure clear carbohydrate drink on the way to the hospital for bariatric patients  ____Ensure clear carbohydrate drink 3 hours before surgery for Dr Dwyane Luo patients if physician instructed.   No gum chewing or hard candies.     __x__ 2. No Alcohol for 24 hours before or after surgery.   __x__3. No Smoking or e-cigarettes for 24 prior to surgery.  Do not use any chewable tobacco products for at least 6 hour prior to surgery   ____  4. Bring all medications with you on the day of surgery if instructed.    __x__ 5. Notify your doctor if there is any change in your medical condition     (cold, fever, infections).    x___6. On the morning of surgery brush your teeth with toothpaste and water.  You may rinse your mouth with mouth wash if you wish.  Do not swallow any toothpaste or mouthwash.   Do not wear jewelry, make-up,  hairpins, clips or nail polish.  Do not wear lotions, powders, or perfumes. You may wear deodorant.  Do not shave 48 hours prior to surgery. Men may shave face and neck.  Do not bring valuables to the hospital.    [facility] is not responsible for any belongings or valuables.               Contacts, dentures or bridgework may not be worn into surgery.  Leave your suitcase in the car. After surgery it may be brought to your room.  For patients admitted to the hospital, discharge time is determined by your                       treatment team.  _  Patients discharged the day of surgery will not be allowed to drive home.  You will need someone to drive you home and stay with you the night of your procedure.    Please read over the following fact sheets that you were given:   [facility] Preparing for Surgery and or MRSA Information   _x___ Take anti-hypertensive listed below, cardiac, seizure, asthma,     anti-reflux and psychiatric medicines. These include:  1. levothyroxine (SYNTHROID, LEVOTHROID) 50 MCG tablet  2.metoprolol succinate (TOPROL-XL  3.  4.  5.  6.  ____Fleets enema or Magnesium Citrate as directed.   _x___ Use CHG Soap or sage wipes as directed on instruction sheet   ____ Use inhalers on the day of surgery and bring to hospital day of surgery  ____ Stop Metformin and Janumet 2 days prior to surgery.    ____ Take 1/2 of usual insulin dose the night before surgery and none on the morning     surgery.   _x___ Follow recommendations from Cardiologist, Pulmonologist or PCP regarding          stopping Aspirin, Coumadin, Plavix ,Eliquis, Effient, or Pradaxa, and Pletal.  X____Stop Anti-inflammatories such as Advil, Aleve, Ibuprofen, Motrin, Naproxen, Naprosyn, Goodies powders or aspirin products. OK to take Tylenol and                          Celebrex.   _x___ Stop supplements until after surgery.  But may continue Vitamin D, Vitamin B,       and multivitamin. Stop  vitamin E today.   ____ Bring C-Pap to the hospital.

## 2018-12-20 MED ORDER — CEFAZOLIN SODIUM-DEXTROSE 1-4 GM/50ML-% IV SOLN
1.0000 g | Freq: Once | INTRAVENOUS | Status: AC
  Administered 2018-12-21: 1 g via INTRAVENOUS

## 2018-12-21 ENCOUNTER — Ambulatory Visit
Admission: RE | Admit: 2018-12-21 | Discharge: 2018-12-21 | Disposition: A | Discharge status: Home / Self Care | Payer: Medicare Other | Attending: Orthopedic Surgery | Admitting: Orthopedic Surgery

## 2018-12-21 ENCOUNTER — Ambulatory Visit: Payer: Medicare Other | Admitting: Anesthesiology

## 2018-12-21 ENCOUNTER — Encounter
Admission: RE | Disposition: A | Discharge status: Home / Self Care | Payer: Self-pay | Source: Home / Self Care | Attending: Orthopedic Surgery

## 2018-12-21 ENCOUNTER — Ambulatory Visit: Payer: Medicare Other

## 2018-12-21 DIAGNOSIS — Z7901 Long term (current) use of anticoagulants: Secondary | ICD-10-CM | POA: Insufficient documentation

## 2018-12-21 DIAGNOSIS — S32010A Wedge compression fracture of first lumbar vertebra, initial encounter for closed fracture: Secondary | ICD-10-CM | POA: Diagnosis not present

## 2018-12-21 DIAGNOSIS — Z79899 Other long term (current) drug therapy: Secondary | ICD-10-CM | POA: Insufficient documentation

## 2018-12-21 DIAGNOSIS — X58XXXA Exposure to other specified factors, initial encounter: Secondary | ICD-10-CM | POA: Insufficient documentation

## 2018-12-21 DIAGNOSIS — Z79890 Hormone replacement therapy (postmenopausal): Secondary | ICD-10-CM | POA: Insufficient documentation

## 2018-12-21 DIAGNOSIS — I4891 Unspecified atrial fibrillation: Secondary | ICD-10-CM | POA: Insufficient documentation

## 2018-12-21 DIAGNOSIS — E039 Hypothyroidism, unspecified: Secondary | ICD-10-CM | POA: Insufficient documentation

## 2018-12-21 DIAGNOSIS — Z1159 Encounter for screening for other viral diseases: Secondary | ICD-10-CM | POA: Insufficient documentation

## 2018-12-21 DIAGNOSIS — I10 Essential (primary) hypertension: Secondary | ICD-10-CM | POA: Insufficient documentation

## 2018-12-21 DIAGNOSIS — Z419 Encounter for procedure for purposes other than remedying health state, unspecified: Secondary | ICD-10-CM

## 2018-12-21 DIAGNOSIS — Z87891 Personal history of nicotine dependence: Secondary | ICD-10-CM | POA: Insufficient documentation

## 2018-12-21 HISTORY — PX: KYPHOPLASTY: SHX5884

## 2018-12-21 IMAGING — XA DG C-ARM 61-120 MIN
1 series · 1 of 1 positions shown · non-contrast
Comparison: MR [DATE]

CLINICAL DATA: Subacute L1 compression deformity

EXAM:
LUMBAR SPINE - 2-3 VIEW; DG C-ARM 61-120 MIN

[Series 2: ortho standard · 1 of 1 slices shown]
[im 1/1]
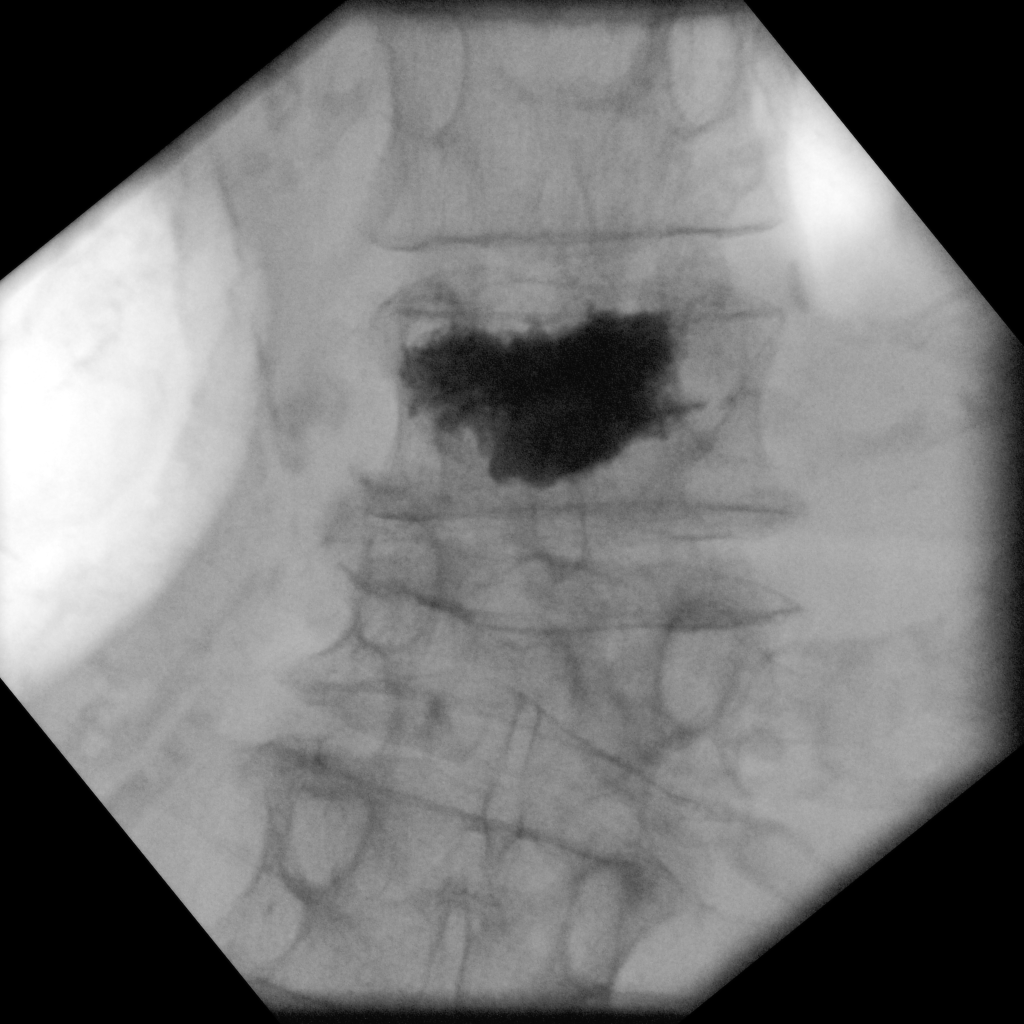

[1 of 1 positions shown; findings below may reference images not displayed]

FINDINGS: 2 intraoperative fluoroscopic spot images document changes of cement
augmentation within the L1 vertebral body. No cement in epidural or
disc spaces. Old L2 compression deformity is noted.
IMPRESSION: Cement augmentation L1.

## 2018-12-21 IMAGING — CR LUMBAR SPINE - 2-3 VIEW
1 series · 1 of 1 positions shown · non-contrast
Comparison: MR [DATE]

CLINICAL DATA: Subacute L1 compression deformity

EXAM:
LUMBAR SPINE - 2-3 VIEW; DG C-ARM 61-120 MIN

[cont.]
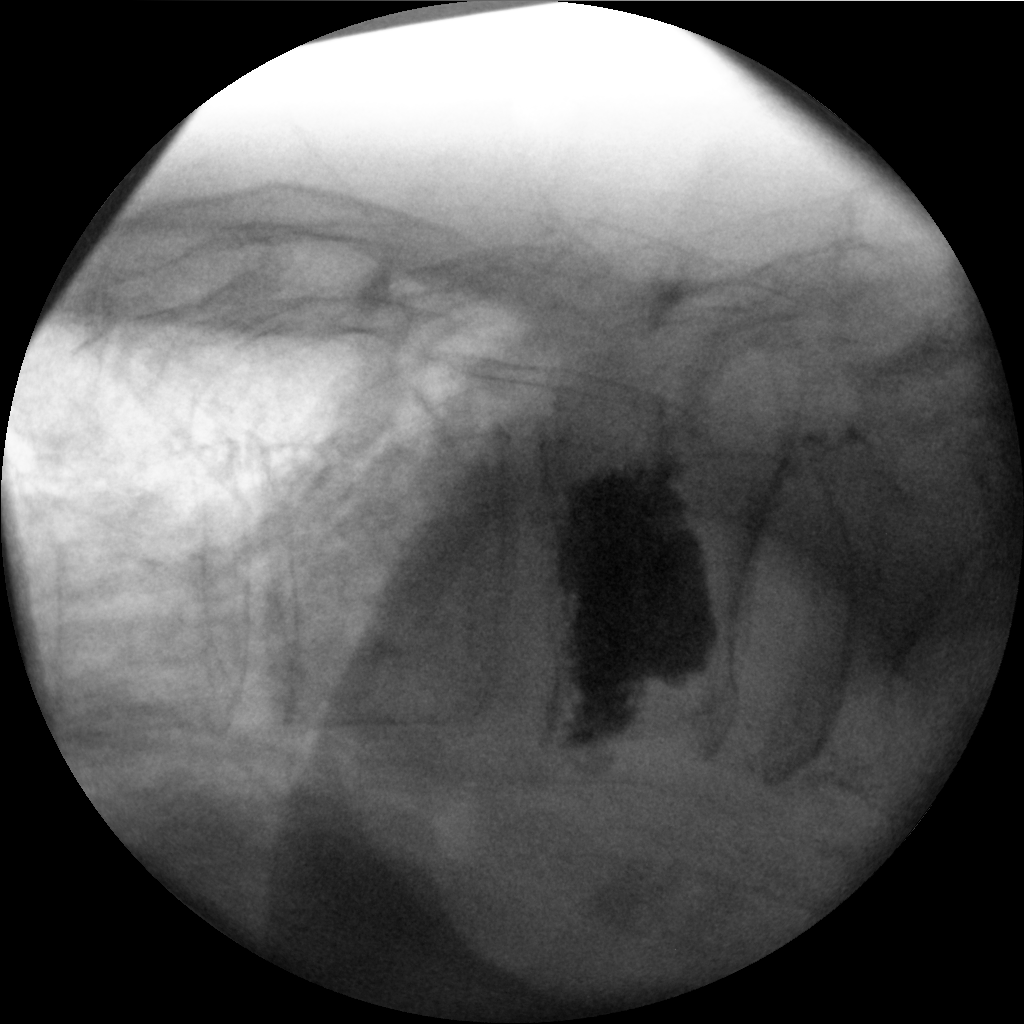

[1 of 1 positions shown; findings below may reference images not displayed]

FINDINGS: 2 intraoperative fluoroscopic spot images document changes of cement
augmentation within the L1 vertebral body. No cement in epidural or
disc spaces. Old L2 compression deformity is noted.
IMPRESSION: Cement augmentation L1.

## 2018-12-21 SURGERY — KYPHOPLASTY
Anesthesia: General | Site: Back

## 2018-12-21 MED ORDER — FAMOTIDINE 20 MG PO TABS
20.0000 mg | ORAL_TABLET | Freq: Once | ORAL | Status: AC
  Administered 2018-12-21: 10:00:00 20 mg via ORAL

## 2018-12-21 MED ORDER — FAMOTIDINE 20 MG PO TABS
ORAL_TABLET | ORAL | Status: AC
  Administered 2018-12-21: 20 mg via ORAL
  Filled 2018-12-21: qty 1

## 2018-12-21 MED ORDER — PROPOFOL 500 MG/50ML IV EMUL
INTRAVENOUS | Status: DC | PRN
  Administered 2018-12-21: 25 ug/kg/min via INTRAVENOUS

## 2018-12-21 MED ORDER — MORPHINE SULFATE (PF) 4 MG/ML IV SOLN: 0.5000 mg | INTRAVENOUS | Status: DC | PRN

## 2018-12-21 MED ORDER — BUPIVACAINE-EPINEPHRINE (PF) 0.5% -1:200000 IJ SOLN
INTRAMUSCULAR | Status: DC | PRN
  Administered 2018-12-21: 20 mL

## 2018-12-21 MED ORDER — ACETAMINOPHEN 325 MG PO TABS: 325.0000 mg | ORAL_TABLET | Freq: Four times a day (QID) | ORAL | Status: DC | PRN

## 2018-12-21 MED ORDER — METOCLOPRAMIDE HCL 10 MG PO TABS: 5.0000 mg | ORAL_TABLET | Freq: Three times a day (TID) | ORAL | Status: DC | PRN

## 2018-12-21 MED ORDER — MIDAZOLAM HCL 2 MG/2ML IJ SOLN
INTRAMUSCULAR | Status: AC
  Filled 2018-12-21: qty 2

## 2018-12-21 MED ORDER — HYDROCODONE-ACETAMINOPHEN 7.5-325 MG PO TABS
1.0000 | ORAL_TABLET | ORAL | Status: DC | PRN
  Filled 2018-12-21: qty 2

## 2018-12-21 MED ORDER — ONDANSETRON HCL 4 MG PO TABS: 4.0000 mg | ORAL_TABLET | Freq: Four times a day (QID) | ORAL | Status: DC | PRN

## 2018-12-21 MED ORDER — LACTATED RINGERS IV SOLN
INTRAVENOUS | Status: DC
  Administered 2018-12-21: 10:00:00 via INTRAVENOUS

## 2018-12-21 MED ORDER — MIDAZOLAM HCL 5 MG/5ML IJ SOLN
INTRAMUSCULAR | Status: DC | PRN
  Administered 2018-12-21: 1 mg via INTRAVENOUS

## 2018-12-21 MED ORDER — LIDOCAINE HCL 1 % IJ SOLN
INTRAMUSCULAR | Status: DC | PRN
  Administered 2018-12-21: 10 mL

## 2018-12-21 MED ORDER — CEFAZOLIN SODIUM-DEXTROSE 1-4 GM/50ML-% IV SOLN
INTRAVENOUS | Status: AC
  Filled 2018-12-21: qty 50

## 2018-12-21 MED ORDER — HYDROMORPHONE HCL 1 MG/ML IJ SOLN: 0.2500 mg | INTRAMUSCULAR | Status: DC | PRN

## 2018-12-21 MED ORDER — ACETAMINOPHEN 500 MG PO TABS: 500.0000 mg | ORAL_TABLET | Freq: Four times a day (QID) | ORAL | Status: DC

## 2018-12-21 MED ORDER — ONDANSETRON HCL 4 MG/2ML IJ SOLN
INTRAMUSCULAR | Status: DC | PRN
  Administered 2018-12-21: 4 mg via INTRAVENOUS

## 2018-12-21 MED ORDER — ONDANSETRON HCL 4 MG/2ML IJ SOLN: 4.0000 mg | Freq: Four times a day (QID) | INTRAMUSCULAR | Status: DC | PRN

## 2018-12-21 MED ORDER — PROPOFOL 10 MG/ML IV BOLUS
INTRAVENOUS | Status: DC | PRN
  Administered 2018-12-21: 20 mg via INTRAVENOUS

## 2018-12-21 MED ORDER — ACETAMINOPHEN 325 MG PO TABS: 650.0000 mg | ORAL_TABLET | Freq: Once | ORAL | Status: DC | PRN

## 2018-12-21 MED ORDER — IOPAMIDOL (ISOVUE-M 200) INJECTION 41%
INTRAMUSCULAR | Status: DC | PRN
  Administered 2018-12-21: 20 mL

## 2018-12-21 MED ORDER — HYDROCODONE-ACETAMINOPHEN 5-325 MG PO TABS: 1.0000 | ORAL_TABLET | ORAL | Status: DC | PRN

## 2018-12-21 MED ORDER — KETAMINE HCL 50 MG/ML IJ SOLN
INTRAMUSCULAR | Status: DC | PRN
  Administered 2018-12-21: 10 mg via INTRAMUSCULAR
  Administered 2018-12-21: 10 mg via INTRAVENOUS

## 2018-12-21 MED ORDER — METOCLOPRAMIDE HCL 5 MG/ML IJ SOLN: 5.0000 mg | Freq: Three times a day (TID) | INTRAMUSCULAR | Status: DC | PRN

## 2018-12-21 MED ORDER — SODIUM CHLORIDE 0.9 % IV SOLN: INTRAVENOUS | Status: DC

## 2018-12-21 SURGICAL SUPPLY — 20 items

## 2018-12-21 NOTE — Discharge Instructions (Addendum)
Take it easy today and tomorrow.  Resume more normal activities starting on Saturday.  Remove Band-Aid on Saturday then okay to shower.  No lifting over 5 pounds for 2 weeks.   AMBULATORY SURGERY  DISCHARGE INSTRUCTIONS   1) The drugs that you were given will stay in your system until tomorrow so for the next 24 hours you should not:  A) Drive an automobile B) Make any legal decisions C) Drink any alcoholic beverage   2) You may resume regular meals tomorrow.  Today it is better to start with liquids and gradually work up to solid foods.  You may eat anything you prefer, but it is better to start with liquids, then soup and crackers, and gradually work up to solid foods.   3) Please notify your doctor immediately if you have any unusual bleeding, trouble breathing, redness and pain at the surgery site, drainage, fever, or pain not relieved by medication.    4) Additional Instructions:        Please contact your physician with any problems or Same Day Surgery at [phone], Monday through Friday 6 am to 4 pm, or [place] at [facility] number at [phone].

## 2018-12-21 NOTE — Transfer of Care (Signed)
Immediate Anesthesia Transfer of Care Note  Patient: Kaylee Santiago  Procedure(s) Performed: KYPHOPLASTY L1 (N/A Back)  Patient Location: PACU  Anesthesia Type:General  Level of Consciousness: awake, alert  and oriented  Airway & Oxygen Therapy: Patient Spontanous Breathing and Patient connected to nasal cannula oxygen  Post-op Assessment: Report given to RN and Post -op Vital signs reviewed and stable  Post vital signs: Reviewed  Last Vitals:  Vitals Value Taken Time  BP 159/79 12/21/18 1127  Temp    Pulse 89 12/21/18 1128  Resp 18 12/21/18 1128  SpO2 99 % 12/21/18 1128  Vitals shown include unvalidated device data.  Last Pain:  Vitals:   12/21/18 1009  TempSrc: Tympanic  PainSc: 8          Complications: No apparent anesthesia complications

## 2018-12-21 NOTE — Anesthesia Postprocedure Evaluation (Signed)
Anesthesia Post Note  Patient: Kaylee Santiago  Procedure(s) Performed: KYPHOPLASTY L1 (N/A Back)  Patient location during evaluation: PACU Anesthesia Type: General Level of consciousness: awake and alert Pain management: pain level controlled Vital Signs Assessment: post-procedure vital signs reviewed and stable Respiratory status: spontaneous breathing, nonlabored ventilation and respiratory function stable Cardiovascular status: blood pressure returned to baseline and stable Postop Assessment: no apparent nausea or vomiting Anesthetic complications: no     Last Vitals:  Vitals:   12/21/18 1244 12/21/18 1301  BP: (!) 166/74 (!) 160/68  Pulse: 74 75  Resp: 18 18  Temp: 36.6 C   SpO2: 93% 96%    Last Pain:  Vitals:   12/21/18 1301  TempSrc:   PainSc: 0-No pain                 Durenda Hurt

## 2018-12-21 NOTE — Op Note (Signed)
Date 12/21/2018  Time  11:25 am   PATIENT:  Kaylee Santiago   PRE-OPERATIVE DIAGNOSIS:  closed wedge compression fracture of L1   POST-OPERATIVE DIAGNOSIS:  closed wedge compression fracture of L1   PROCEDURE:  Procedure(s): KYPHOPLASTY L1  SURGEON: Laurene Footman, MD   ASSISTANTS: None   ANESTHESIA:   local and MAC   EBL:  No intake/output data recorded.   BLOOD ADMINISTERED:none   DRAINS: none    LOCAL MEDICATIONS USED:  MARCAINE    and XYLOCAINE    SPECIMEN:   L1 vertebral body biopsy   DISPOSITION OF SPECIMEN:  Pathology   COUNTS:  YES   TOURNIQUET:  * No tourniquets in log *   IMPLANTS: Bone cement   DICTATION: .Dragon Dictation  patient was brought to the operating room and after adequate anesthesia was obtained the patient was placed prone.  C arm was brought in in good visualization of the affected level obtained on both AP and lateral projections.  After patient identification and timeout procedures were completed, local anesthetic was infiltrated with 10 cc 1% Xylocaine infiltrated subcutaneously.  This is done the area on the right side of the planned approach.  The back was then prepped and draped in the usual sterile manner and repeat timeout procedure carried out.  A spinal needle was brought down to the pedicle on the right side of  L1 and a 50-50 mix of 1% Xylocaine half percent Sensorcaine with epinephrine total of 20 cc injected.  After allowing this to set a small incision was made and the trocar was advanced into the vertebral body in an extrapedicular fashion.  Biopsy was obtained Drilling was carried out balloon inserted with inflation to  3-1/2 40 cc.  When the cement was appropriate consistency 5-1/2 cc were injected into the vertebral body without extravasation, good fill superior to inferior endplates and from right to left sides along the inferior endplate.  After the cement had set the trochar was removed and permanent C-arm views obtained.  The wound was  closed with Dermabond followed by Band-Aid   PLAN OF CARE: Discharge to home after PACU   PATIENT DISPOSITION:  PACU - hemodynamically stable.

## 2018-12-21 NOTE — H&P (Signed)
Reviewed paper H+P, will be scanned into chart. No changes noted.  

## 2018-12-21 NOTE — Anesthesia Post-op Follow-up Note (Signed)
Anesthesia QCDR form completed.        

## 2018-12-21 NOTE — Anesthesia Preprocedure Evaluation (Addendum)
Anesthesia Evaluation  Patient identified by MRN, date of birth, ID band Patient awake    Reviewed: Allergy & Precautions, H&P , NPO status , Patient's Chart, lab work & pertinent test results  Airway Mallampati: III  TM Distance: >3 FB     Dental  (+) Teeth Intact   Pulmonary neg shortness of breath, neg COPD, former smoker,           Cardiovascular hypertension, (-) angina(-) Past MI and (-) Cardiac Stents + dysrhythmias Atrial Fibrillation      Neuro/Psych negative neurological ROS  negative psych ROS   GI/Hepatic negative GI ROS, Neg liver ROS,   Endo/Other  Hypothyroidism   Renal/GU negative Renal ROS  negative genitourinary   Musculoskeletal   Abdominal   Peds  Hematology negative hematology ROS (+)   Anesthesia Other Findings Past Medical History: No date: Atrial fibrillation (HCC) No date: Atrial fibrillation (HCC) No date: Back pain No date: Dysrhythmia No date: Hypertension No date: Hypothyroidism  Past Surgical History: No date: ABDOMINAL HYSTERECTOMY 70's: BREAST EXCISIONAL BIOPSY; Right No date: CATARACT EXTRACTION W/ INTRAOCULAR LENS  IMPLANT, BILATERAL     Reproductive/Obstetrics negative OB ROS                            Anesthesia Physical Anesthesia Plan  ASA: II  Anesthesia Plan: General   Post-op Pain Management:    Induction:   PONV Risk Score and Plan: Propofol infusion and TIVA  Airway Management Planned: Natural Airway and Nasal Cannula  Additional Equipment:   Intra-op Plan:   Post-operative Plan:   Informed Consent: I have reviewed the patients History and Physical, chart, labs and discussed the procedure including the risks, benefits and alternatives for the proposed anesthesia with the patient or authorized representative who has indicated his/her understanding and acceptance.     Dental Advisory Given  Plan Discussed with:  Anesthesiologist and CRNA  Anesthesia Plan Comments:         Anesthesia Quick Evaluation

## 2018-12-22 ENCOUNTER — Encounter: Payer: Self-pay | Admitting: Orthopedic Surgery

## 2018-12-22 LAB — SURGICAL PATHOLOGY

## 2019-01-01 ENCOUNTER — Emergency Department: Payer: Medicare Other

## 2019-01-01 ENCOUNTER — Other Ambulatory Visit: Payer: Self-pay

## 2019-01-01 ENCOUNTER — Encounter: Payer: Self-pay | Admitting: Intensive Care

## 2019-01-01 ENCOUNTER — Ambulatory Visit: Payer: Self-pay

## 2019-01-01 ENCOUNTER — Emergency Department
Admission: EM | Admit: 2019-01-01 | Discharge: 2019-01-01 | Disposition: A | Discharge status: Home / Self Care | Payer: Medicare Other | Attending: Emergency Medicine | Admitting: Emergency Medicine

## 2019-01-01 DIAGNOSIS — Z79899 Other long term (current) drug therapy: Secondary | ICD-10-CM | POA: Insufficient documentation

## 2019-01-01 DIAGNOSIS — I10 Essential (primary) hypertension: Secondary | ICD-10-CM | POA: Diagnosis not present

## 2019-01-01 DIAGNOSIS — Z87891 Personal history of nicotine dependence: Secondary | ICD-10-CM | POA: Diagnosis not present

## 2019-01-01 DIAGNOSIS — I4891 Unspecified atrial fibrillation: Secondary | ICD-10-CM | POA: Diagnosis not present

## 2019-01-01 DIAGNOSIS — R109 Unspecified abdominal pain: Secondary | ICD-10-CM | POA: Diagnosis present

## 2019-01-01 DIAGNOSIS — J189 Pneumonia, unspecified organism: Secondary | ICD-10-CM

## 2019-01-01 LAB — URINALYSIS, COMPLETE (UACMP) WITH MICROSCOPIC
Bacteria, UA: NONE SEEN
Bilirubin Urine: NEGATIVE
Glucose, UA: NEGATIVE mg/dL
Ketones, ur: NEGATIVE mg/dL
Leukocytes,Ua: NEGATIVE
Nitrite: NEGATIVE
Protein, ur: NEGATIVE mg/dL
Specific Gravity, Urine: 1.005 (ref 1.005–1.030)
Squamous Epithelial / HPF: NONE SEEN (ref 0–5)
pH: 7 (ref 5.0–8.0)

## 2019-01-01 LAB — CBC
HCT: 39.3 % (ref 36.0–46.0)
Hemoglobin: 13.1 g/dL (ref 12.0–15.0)
MCH: 30.6 pg (ref 26.0–34.0)
MCHC: 33.3 g/dL (ref 30.0–36.0)
MCV: 91.8 fL (ref 80.0–100.0)
Platelets: 275 10*3/uL (ref 150–400)
RBC: 4.28 MIL/uL (ref 3.87–5.11)
RDW: 12.7 % (ref 11.5–15.5)
WBC: 7.4 10*3/uL (ref 4.0–10.5)
nRBC: 0 % (ref 0.0–0.2)

## 2019-01-01 LAB — COMPREHENSIVE METABOLIC PANEL
ALT: 29 U/L (ref 0–44)
AST: 36 U/L (ref 15–41)
Albumin: 4.3 g/dL (ref 3.5–5.0)
Alkaline Phosphatase: 93 U/L (ref 38–126)
Anion gap: 10 (ref 5–15)
BUN: 14 mg/dL (ref 8–23)
CO2: 28 mmol/L (ref 22–32)
Calcium: 9.2 mg/dL (ref 8.9–10.3)
Chloride: 92 mmol/L — ABNORMAL LOW (ref 98–111)
Creatinine, Ser: 0.58 mg/dL (ref 0.44–1.00)
GFR calc Af Amer: >60 mL/min (ref >60)
GFR calc non Af Amer: >60 mL/min (ref >60)
Glucose, Bld: 114 mg/dL — ABNORMAL HIGH (ref 70–99)
Potassium: 4.2 mmol/L (ref 3.5–5.1)
Sodium: 130 mmol/L — ABNORMAL LOW (ref 135–145)
Total Bilirubin: 0.5 mg/dL (ref 0.3–1.2)
Total Protein: 7.4 g/dL (ref 6.5–8.1)

## 2019-01-01 LAB — LIPASE, BLOOD: Lipase: 39 U/L (ref 11–51)

## 2019-01-01 IMAGING — CT CT ABDOMEN AND PELVIS WITH CONTRAST
2 of 5 series · 16 of 46 positions shown, 18 images · IV contrast (omnipaque)
Comparison: Abdominal ultrasound dated [DATE] and CT dated
[DATE]

CLINICAL DATA: 83-year-old female with abdominal pain.

EXAM:
CT ABDOMEN AND PELVIS WITH CONTRAST
TECHNIQUE: Multidetector CT imaging of the abdomen and pelvis was performed
using the standard protocol following bolus administration of
intravenous contrast.
CONTRAST:  75mL OMNIPAQUE IOHEXOL 300 MG/ML  SOLN

[Series 2: routine abd/pel with · axial · 0.73mm/px · z∈[-446,-116]mm · 13 of 76 slices shown, 15 images]
[im 5/76  soft-tissue]
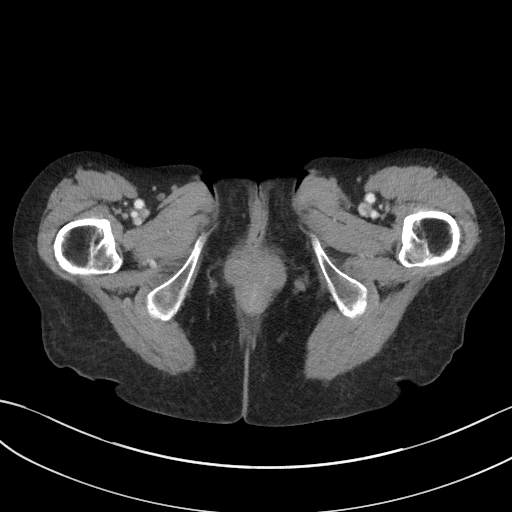
[im 5/76  bone]
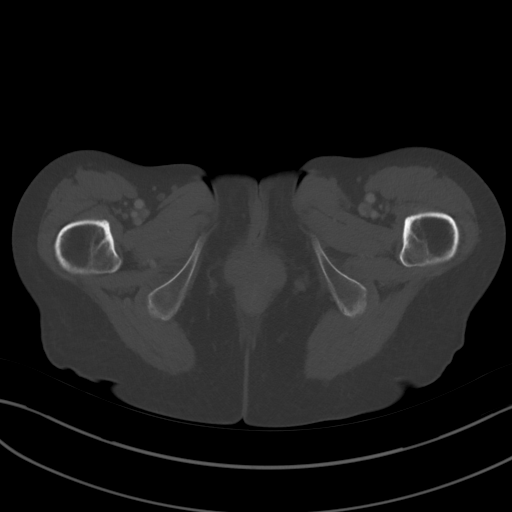
[im 9/76  soft-tissue]
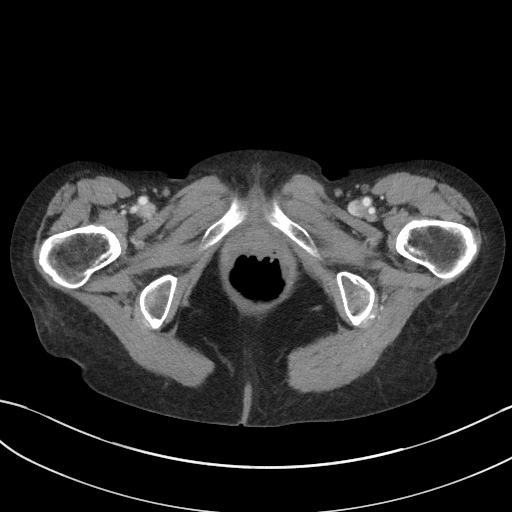
[im 18/76  soft-tissue]
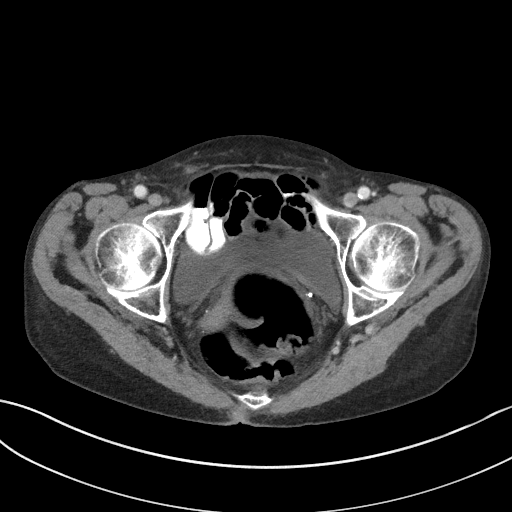
[im 23/76  soft-tissue]
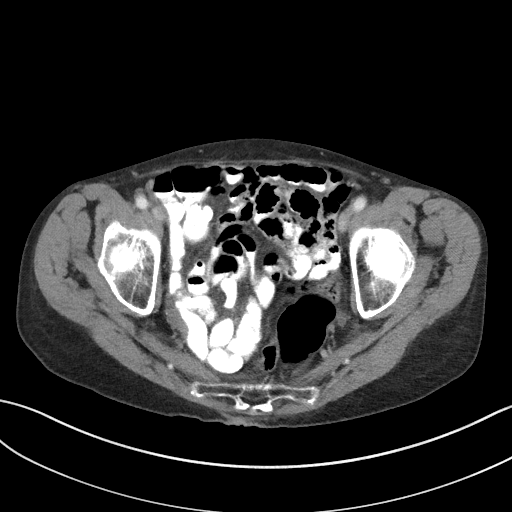
[im 27/76  soft-tissue]
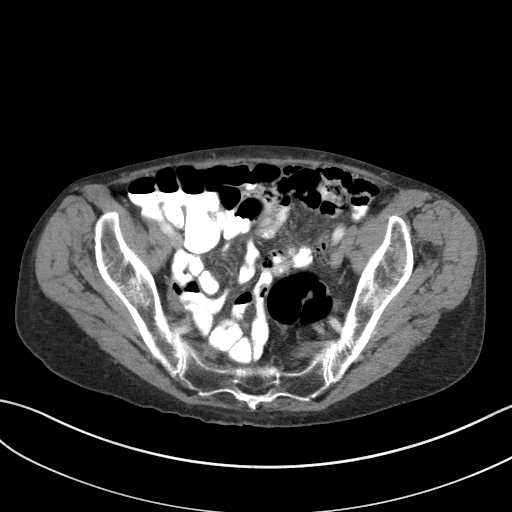
[im 31/76  soft-tissue]
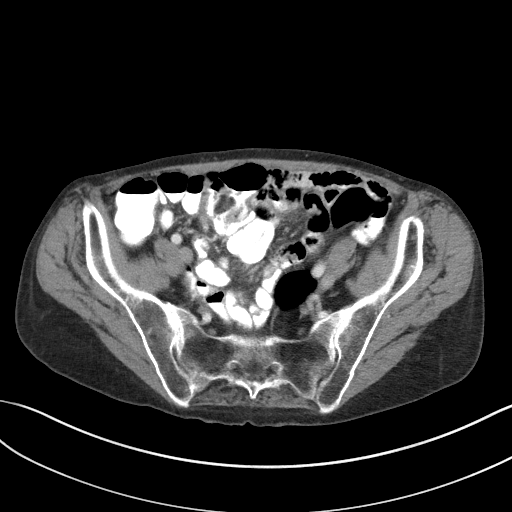
[im 40/76  soft-tissue]
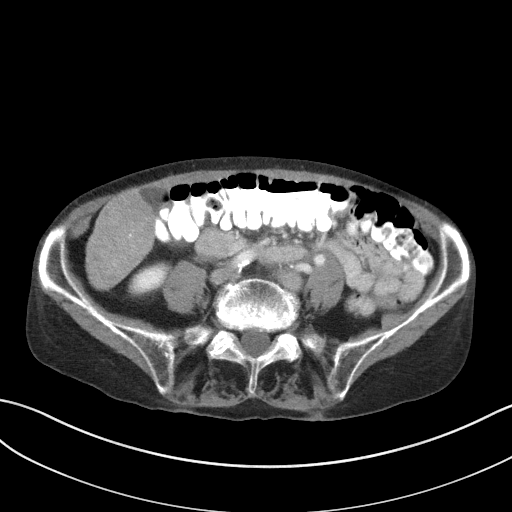
[im 45/76  soft-tissue]
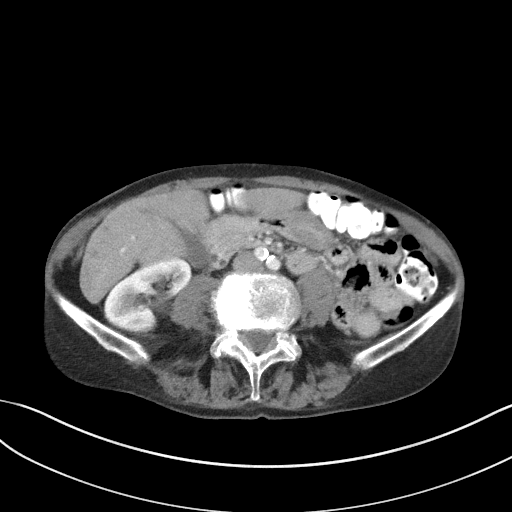
[im 49/76  soft-tissue]
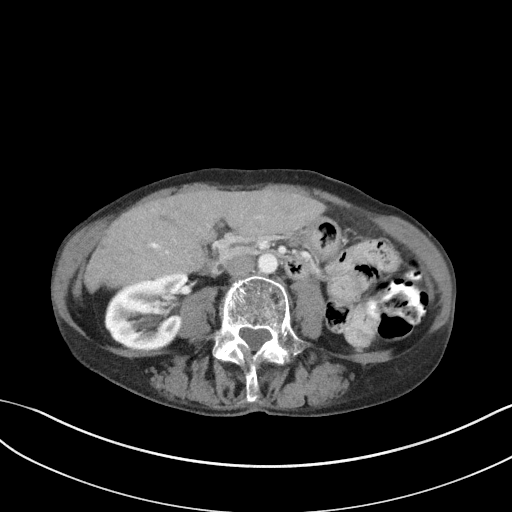
[im 49/76  bone]
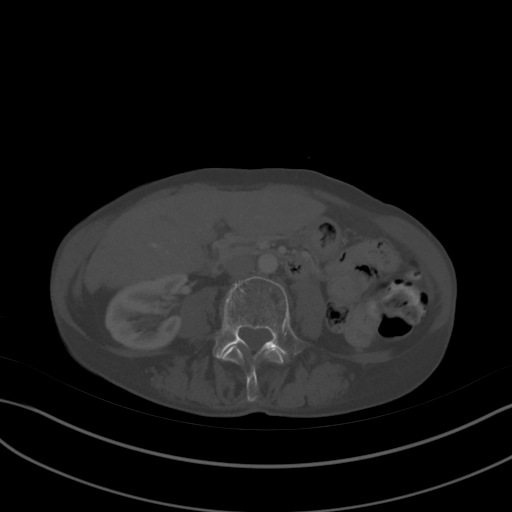
[im 53/76  soft-tissue]
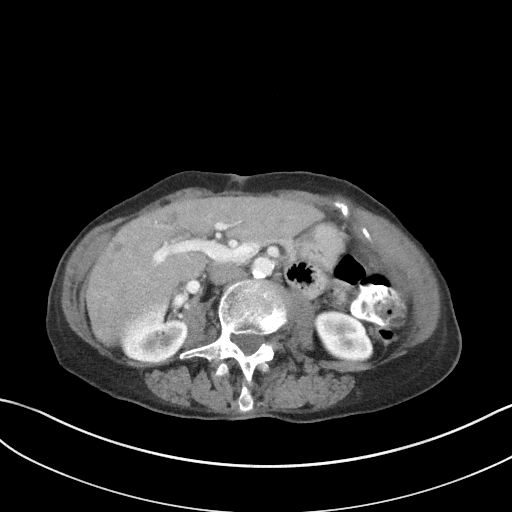
[im 58/76  soft-tissue]
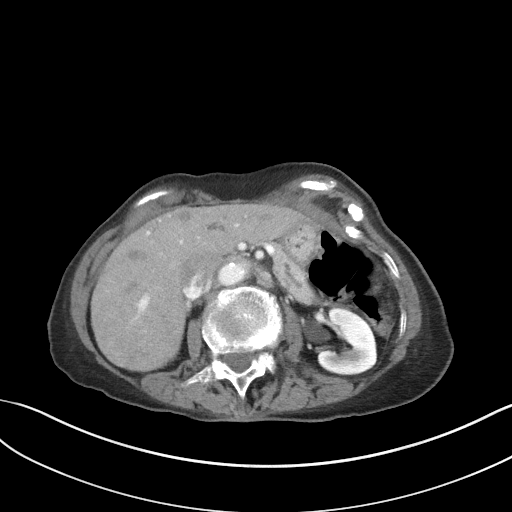
[im 67/76  soft-tissue]
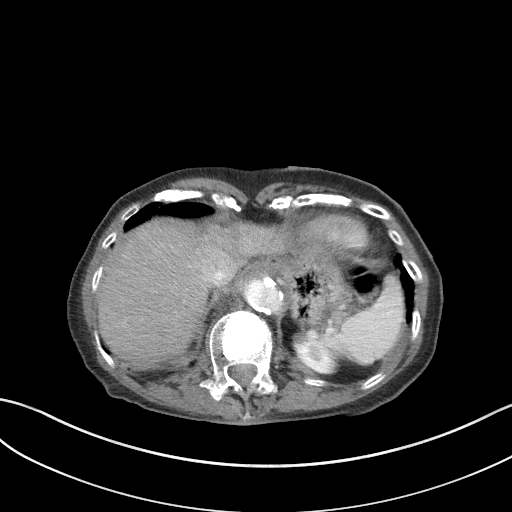
[im 71/76  soft-tissue]
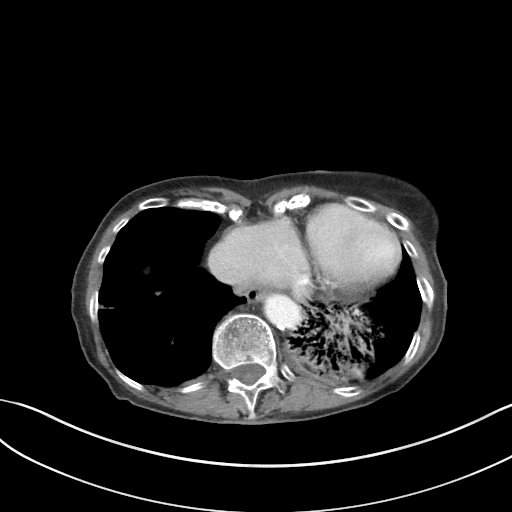

[Series 5: coronal st · coronal · 0.68mm/px · 3 of 69 slices shown]
[im 23/69  soft-tissue]
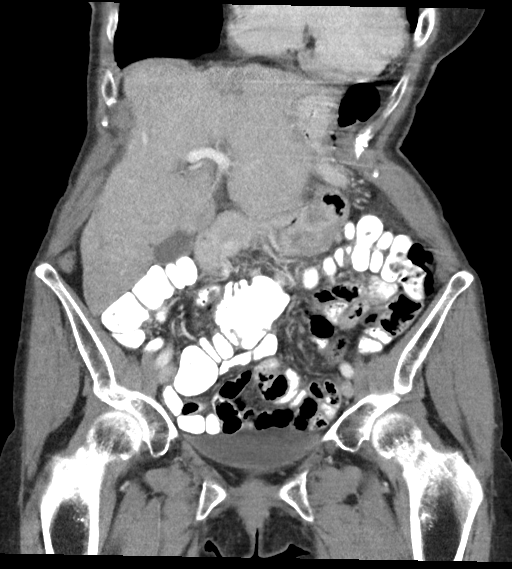
[im 31/69  soft-tissue]
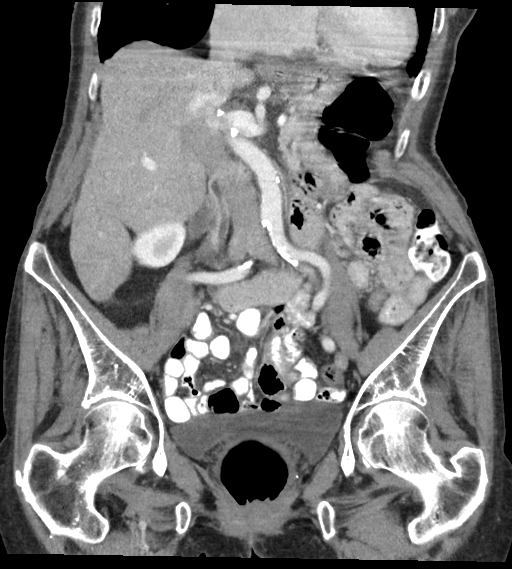
[im 38/69  soft-tissue]
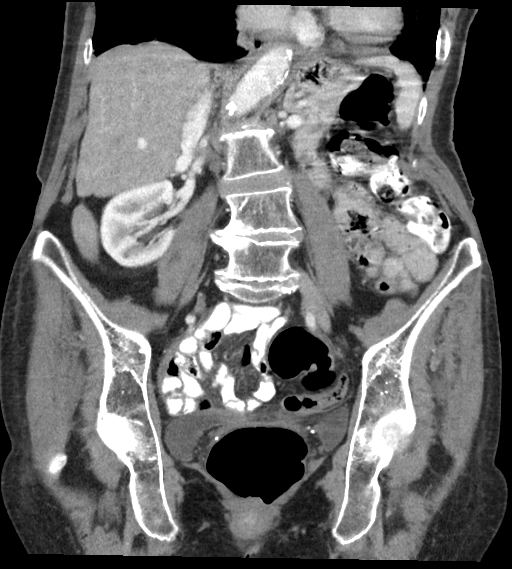

[16 of 46 positions shown; findings below may reference images not displayed]

FINDINGS: Evaluation is limited due to respiratory motion artifact.

Lower chest: There is a patchy area of consolidative change at the
left lung base with air bronchograms and bronchiectatic changes of
the left lower lobe. Although this may be a chronic finding acute
pneumonia is not excluded. Clinical correlation is recommended.
Severely dilated right atrium with progression since the prior CT.
Further evaluation with echocardiogram or cardiac MRI on a
nonemergent basis recommended.

No intra-abdominal free air or free fluid.

Hepatobiliary: Mildly enlarged liver. There is a slight irregularity
of the liver contour which may represent early changes of cirrhosis.
No intrahepatic biliary ductal dilatation. The gallbladder is
unremarkable.

Pancreas: The pancreas is suboptimally evaluated due to poor
visualization. No acute abnormality identified.

Spleen: Normal in size without focal abnormality.

Adrenals/Urinary Tract: The adrenal glands are unremarkable. There
is no hydronephrosis on either side. There is symmetric enhancement
and excretion of contrast by both kidneys. The urinary bladder is
unremarkable.

Stomach/Bowel: There is no bowel obstruction or active inflammation.
No evidence of acute appendicitis.

Vascular/Lymphatic: Moderate aortoiliac atherosclerotic disease. No
portal venous gas. There is no adenopathy.

Reproductive: Hysterectomy. No pelvic mass.

Other: None

Musculoskeletal: Osteopenia with scoliosis and degenerative changes
of the spine. T1 vertebroplasty changes and old appearing T2
compression fracture. There is retropulsion of the superior
posterior T2 with focal narrowing of the central canal, a chronic
finding. No acute osseous pathology.
IMPRESSION: 1. No acute intra-abdominal or pelvic pathology. No bowel
obstruction or active inflammation.
2. Mild hepatomegaly with possible early changes of cirrhosis.
Clinical correlation is recommended.
3. Severely dilated right atrium with progression since the prior
CT. Further evaluation with echocardiogram or cardiac MRI on a
nonemergent basis recommended.
4. Left lung base consolidative changes and air bronchograms and
bronchiectasis which may be chronic or represent active pneumonia.
Clinical correlation is recommended.

## 2019-01-01 IMAGING — US ULTRASOUND ABDOMEN LIMITED
1 series · 14 of 25 positions shown · non-contrast
Comparison: CT abdomen pelvis dated [DATE].

CLINICAL DATA: Abdominal pain.

EXAM:
ULTRASOUND ABDOMEN LIMITED RIGHT UPPER QUADRANT

[Series 1: ultrasound abdomen limited · 14 of 48 slices shown]
[im 1/48]
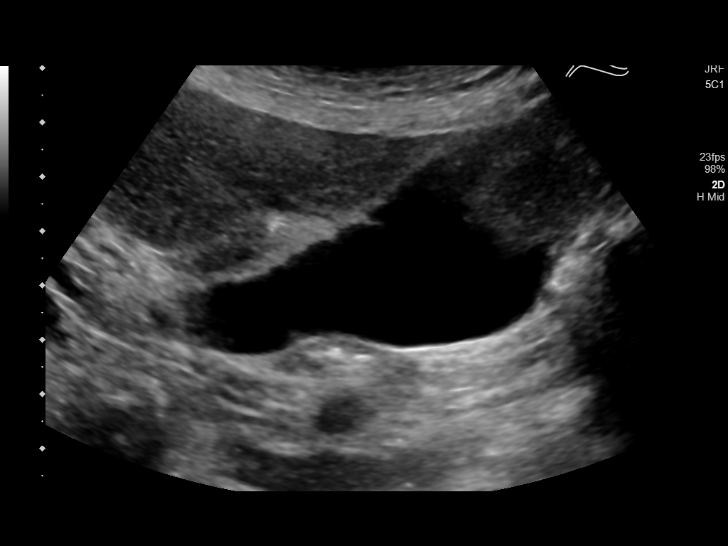
[im 4/48]
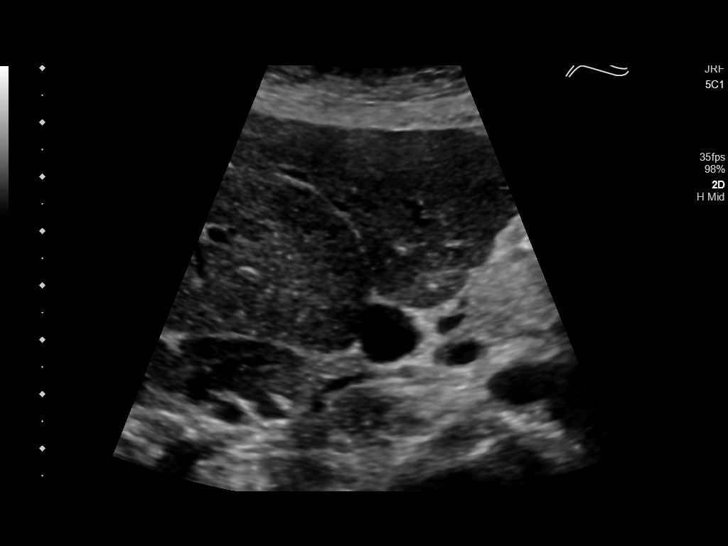
[im 8/48]
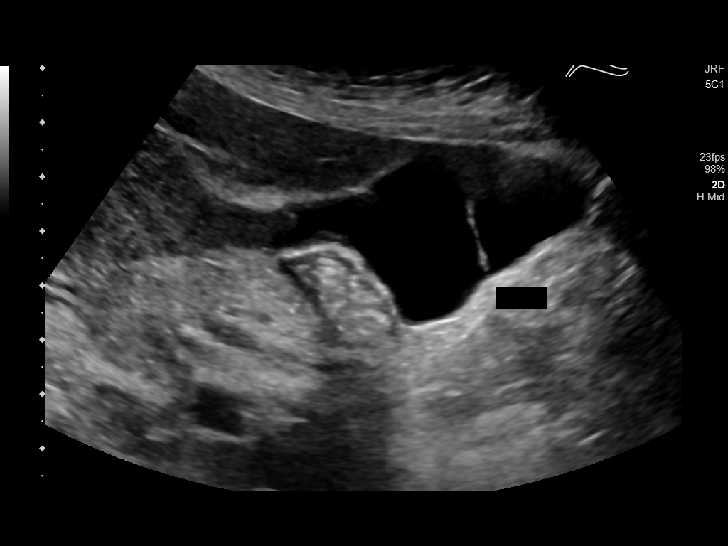
[im 12/48]
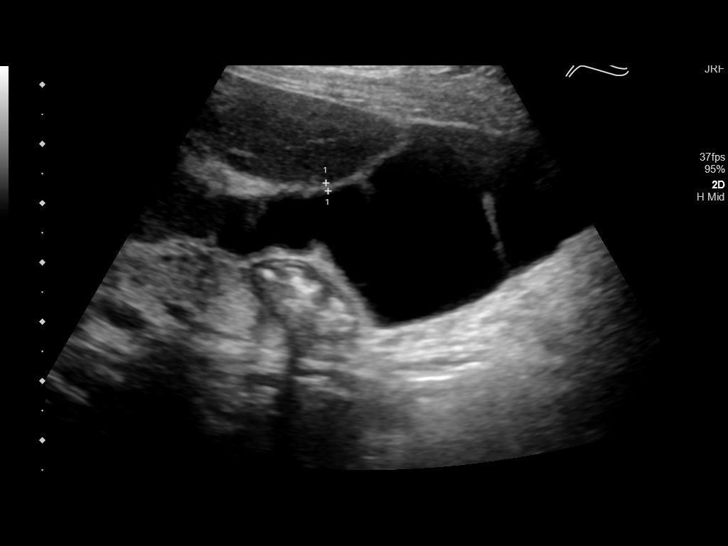
[im 16/48]
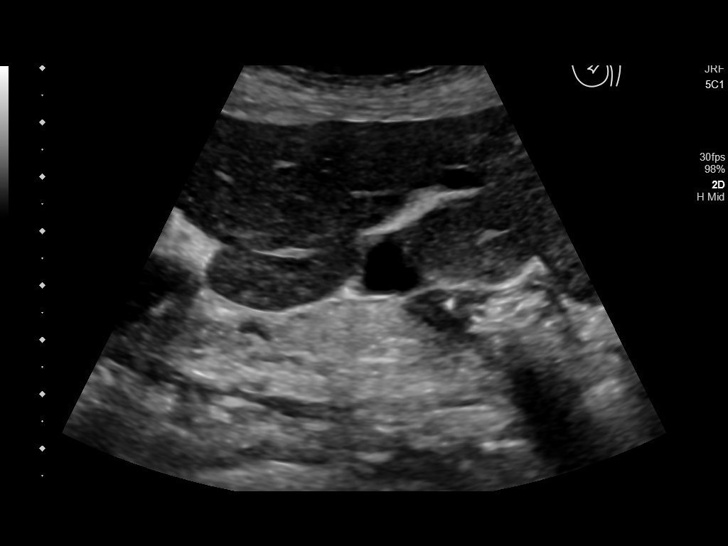
[im 18/48]
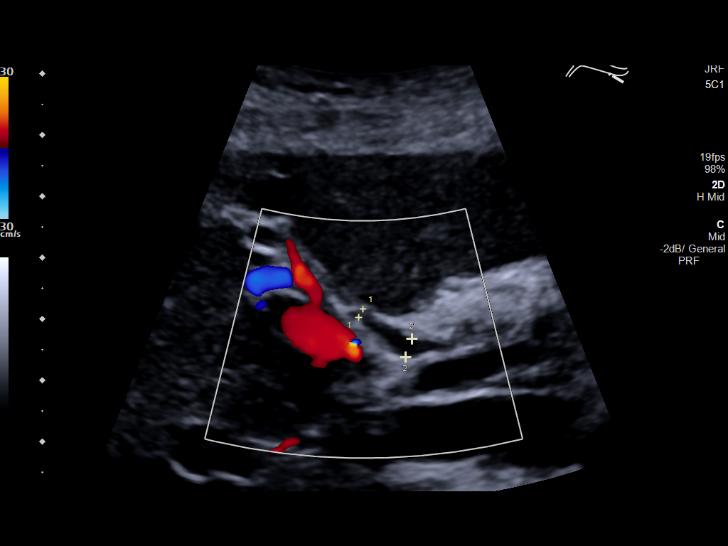
[im 22/48]
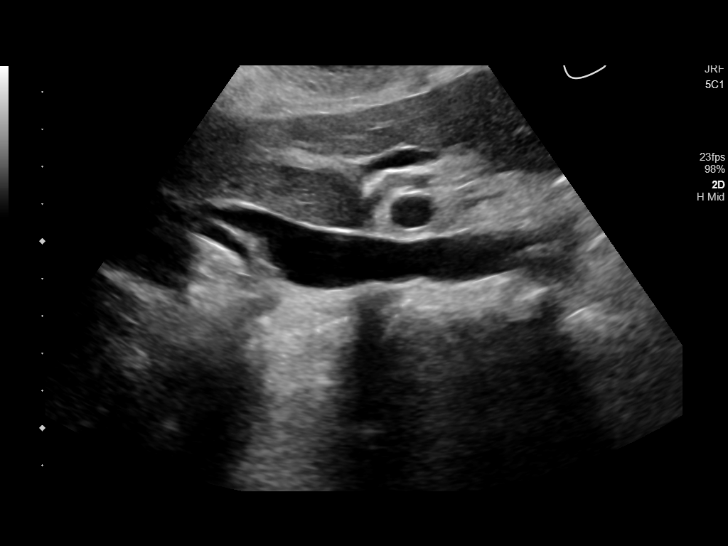
[im 26/48]
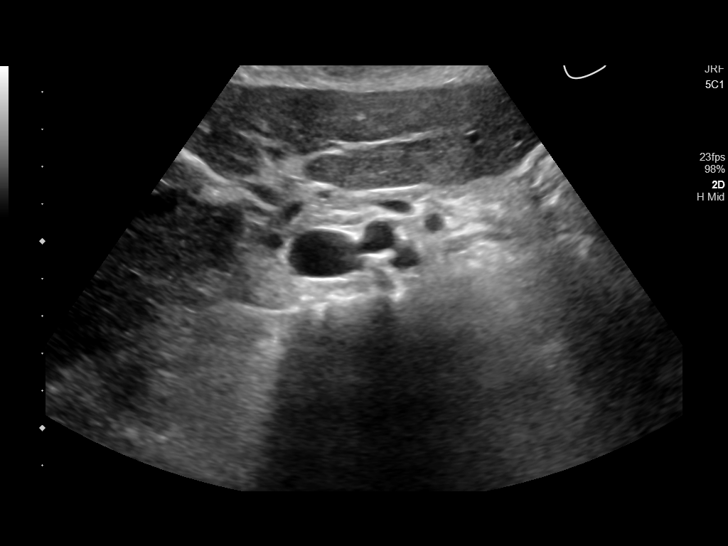
[im 30/48]
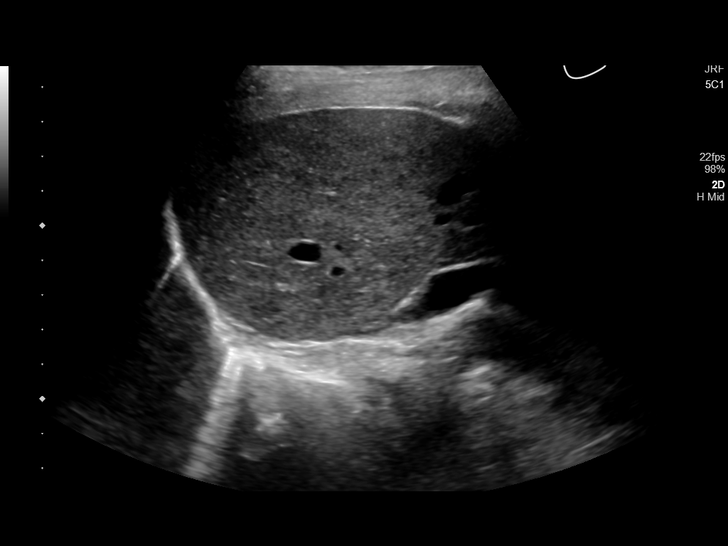
[im 32/48]
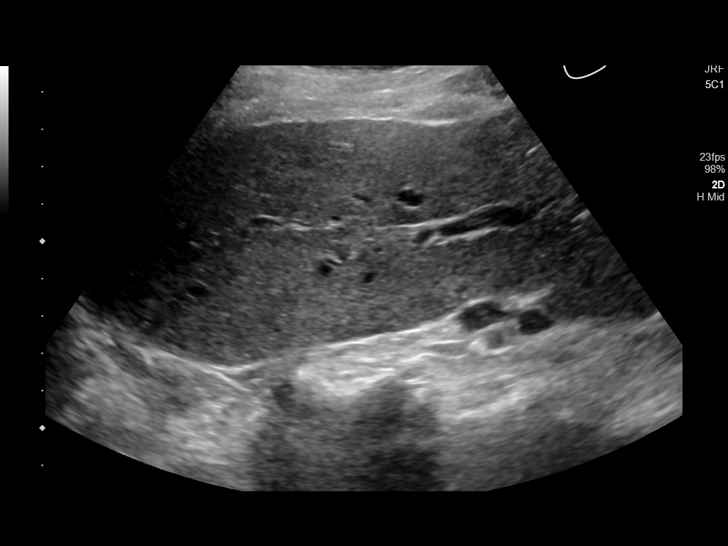
[im 36/48]
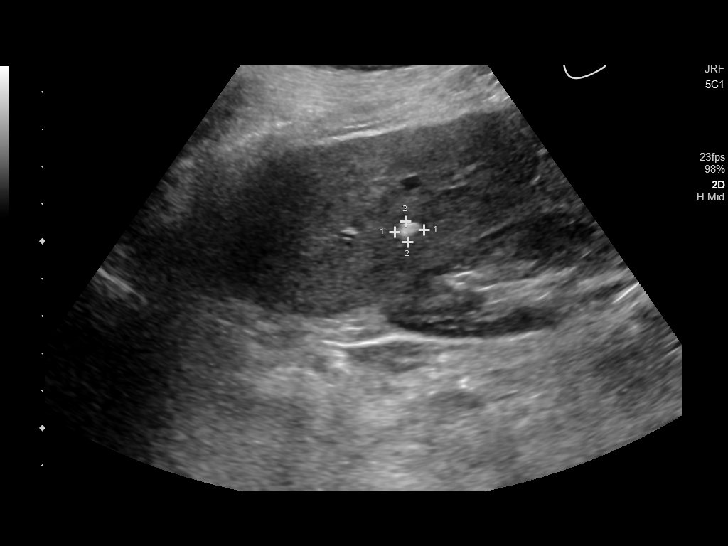
[im 40/48]
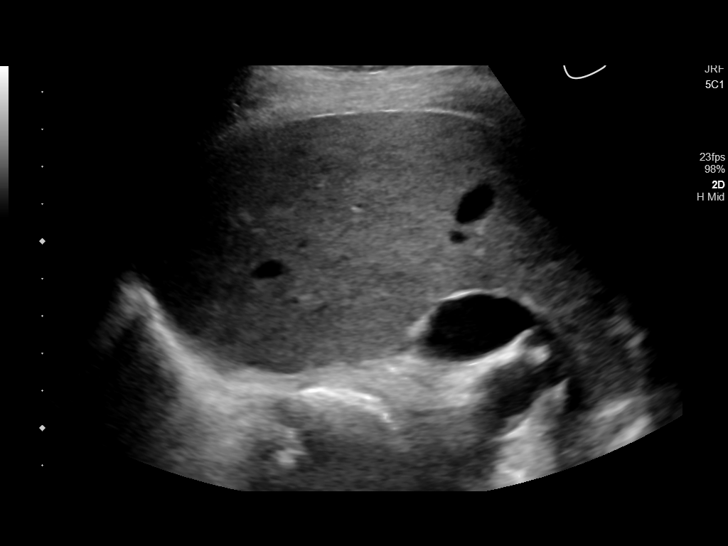
[im 44/48]
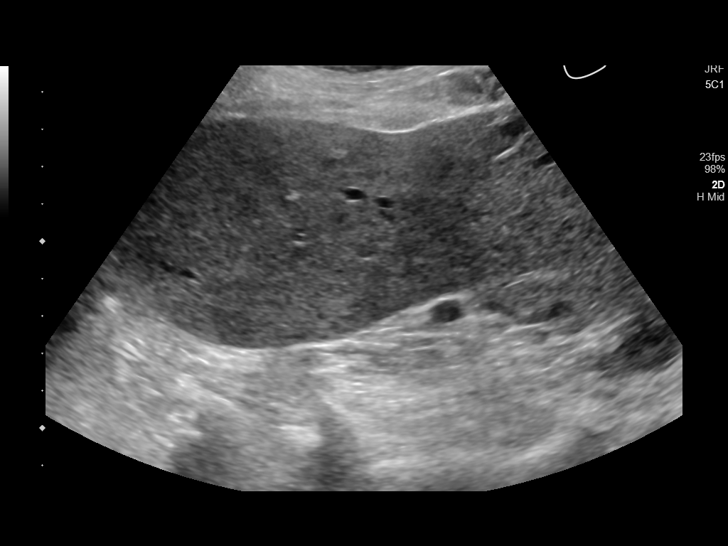
[im 48/48]
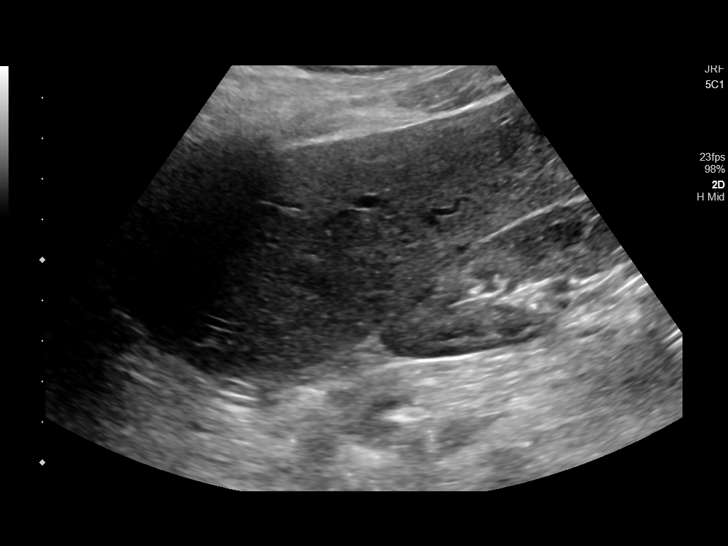

[14 of 25 positions shown; findings below may reference images not displayed]

FINDINGS: Gallbladder:

No gallstones or wall thickening visualized. No sonographic Murphy
sign noted by sonographer.

Common bile duct:

Diameter: 3 mm, normal.

Liver:

8 mm hyperechoic lesion in the right hepatic lobe. Within normal
limits in parenchymal echogenicity. Portal vein is patent on color
Doppler imaging with normal direction of blood flow towards the
liver.
IMPRESSION: 1. No acute abnormality.
2. Small 8 mm hyperechoic lesion in the right lobe of the liver,
probably a hemangioma, benign. If definitive characterization is
required, consider follow-up MRI abdomen without and with MultiHance
in 6 months.

## 2019-01-01 IMAGING — CR CHEST - 2 VIEW
2 series · 2 of 2 positions shown · non-contrast
Comparison: [DATE], [DATE]

CLINICAL DATA: Pneumonia by CT

EXAM:
CHEST - 2 VIEW

[chest pa]
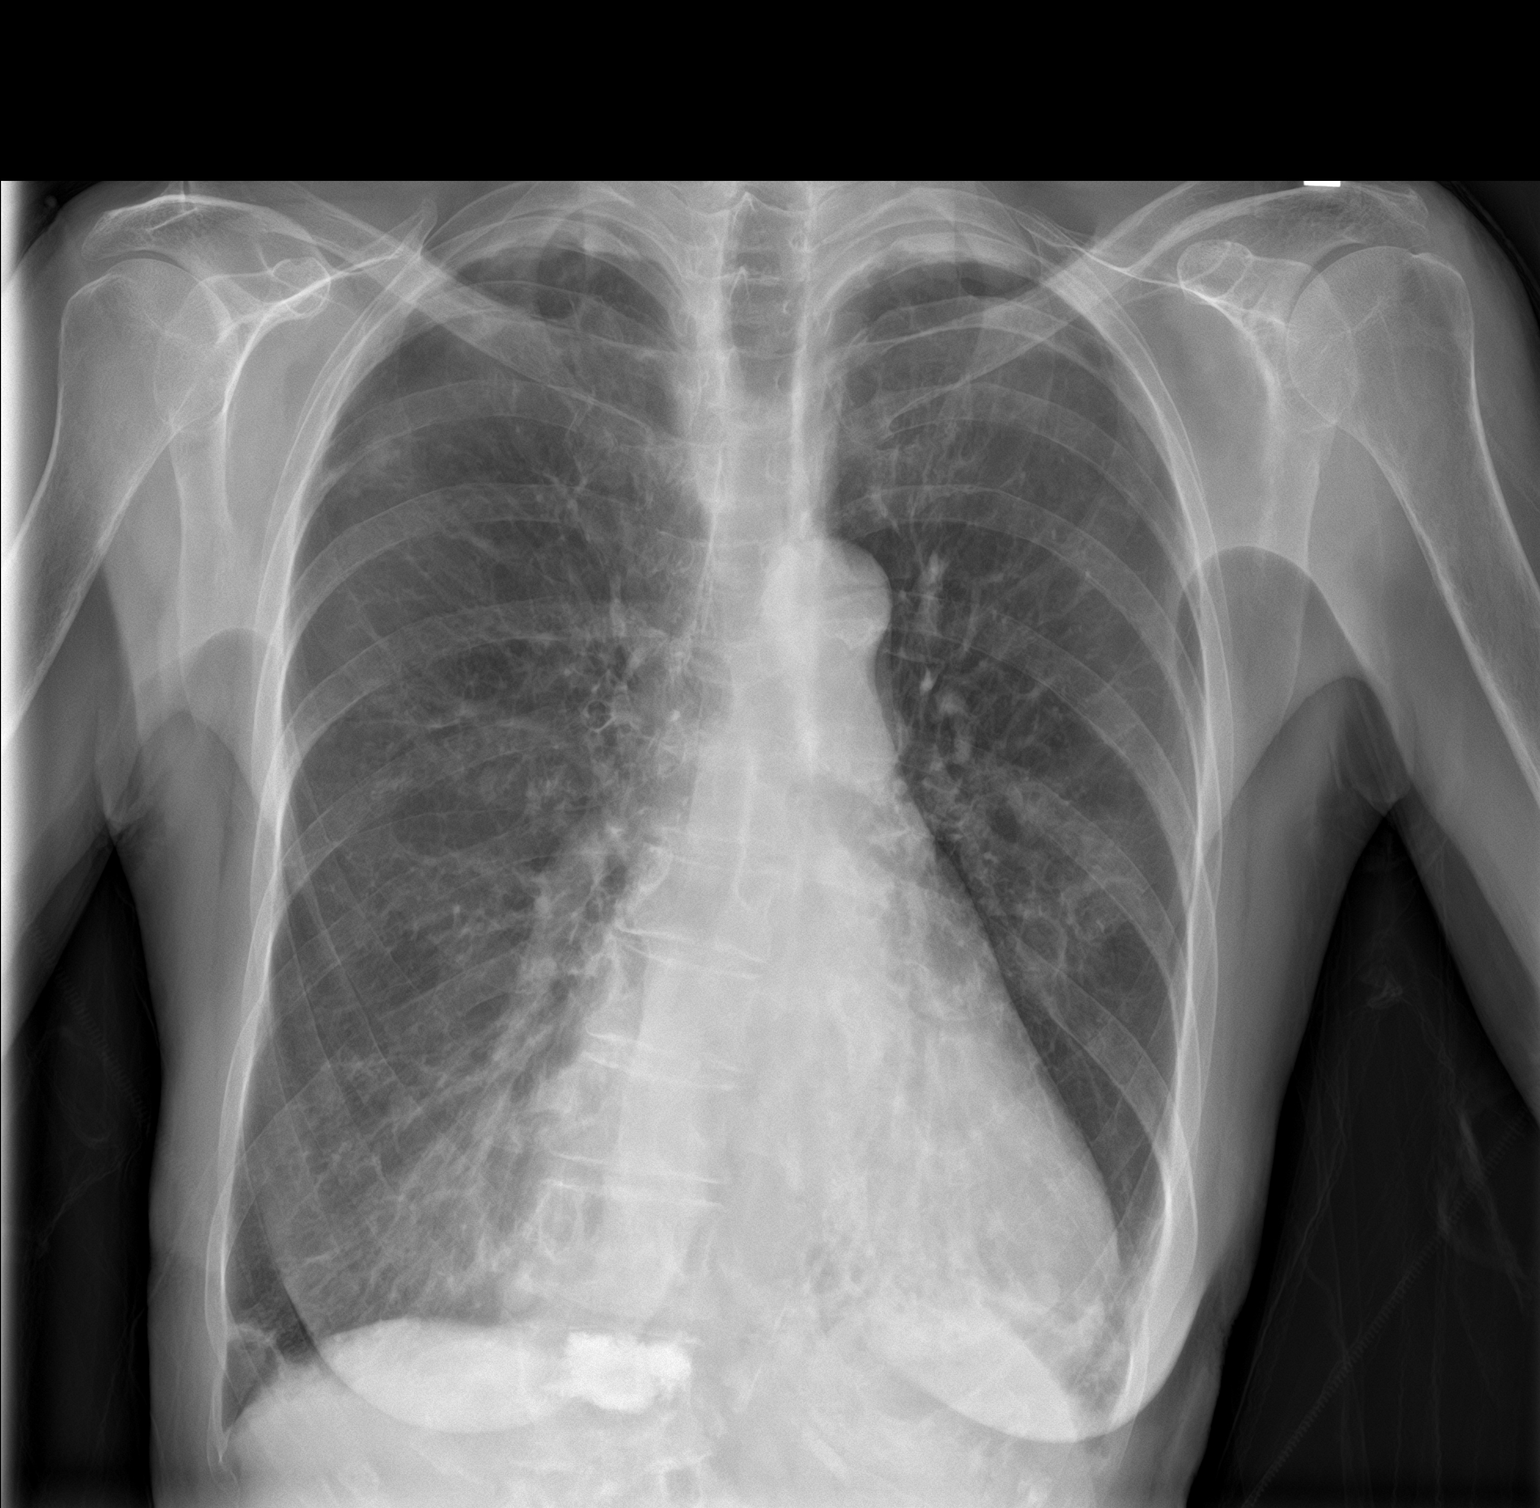

[chest lat]
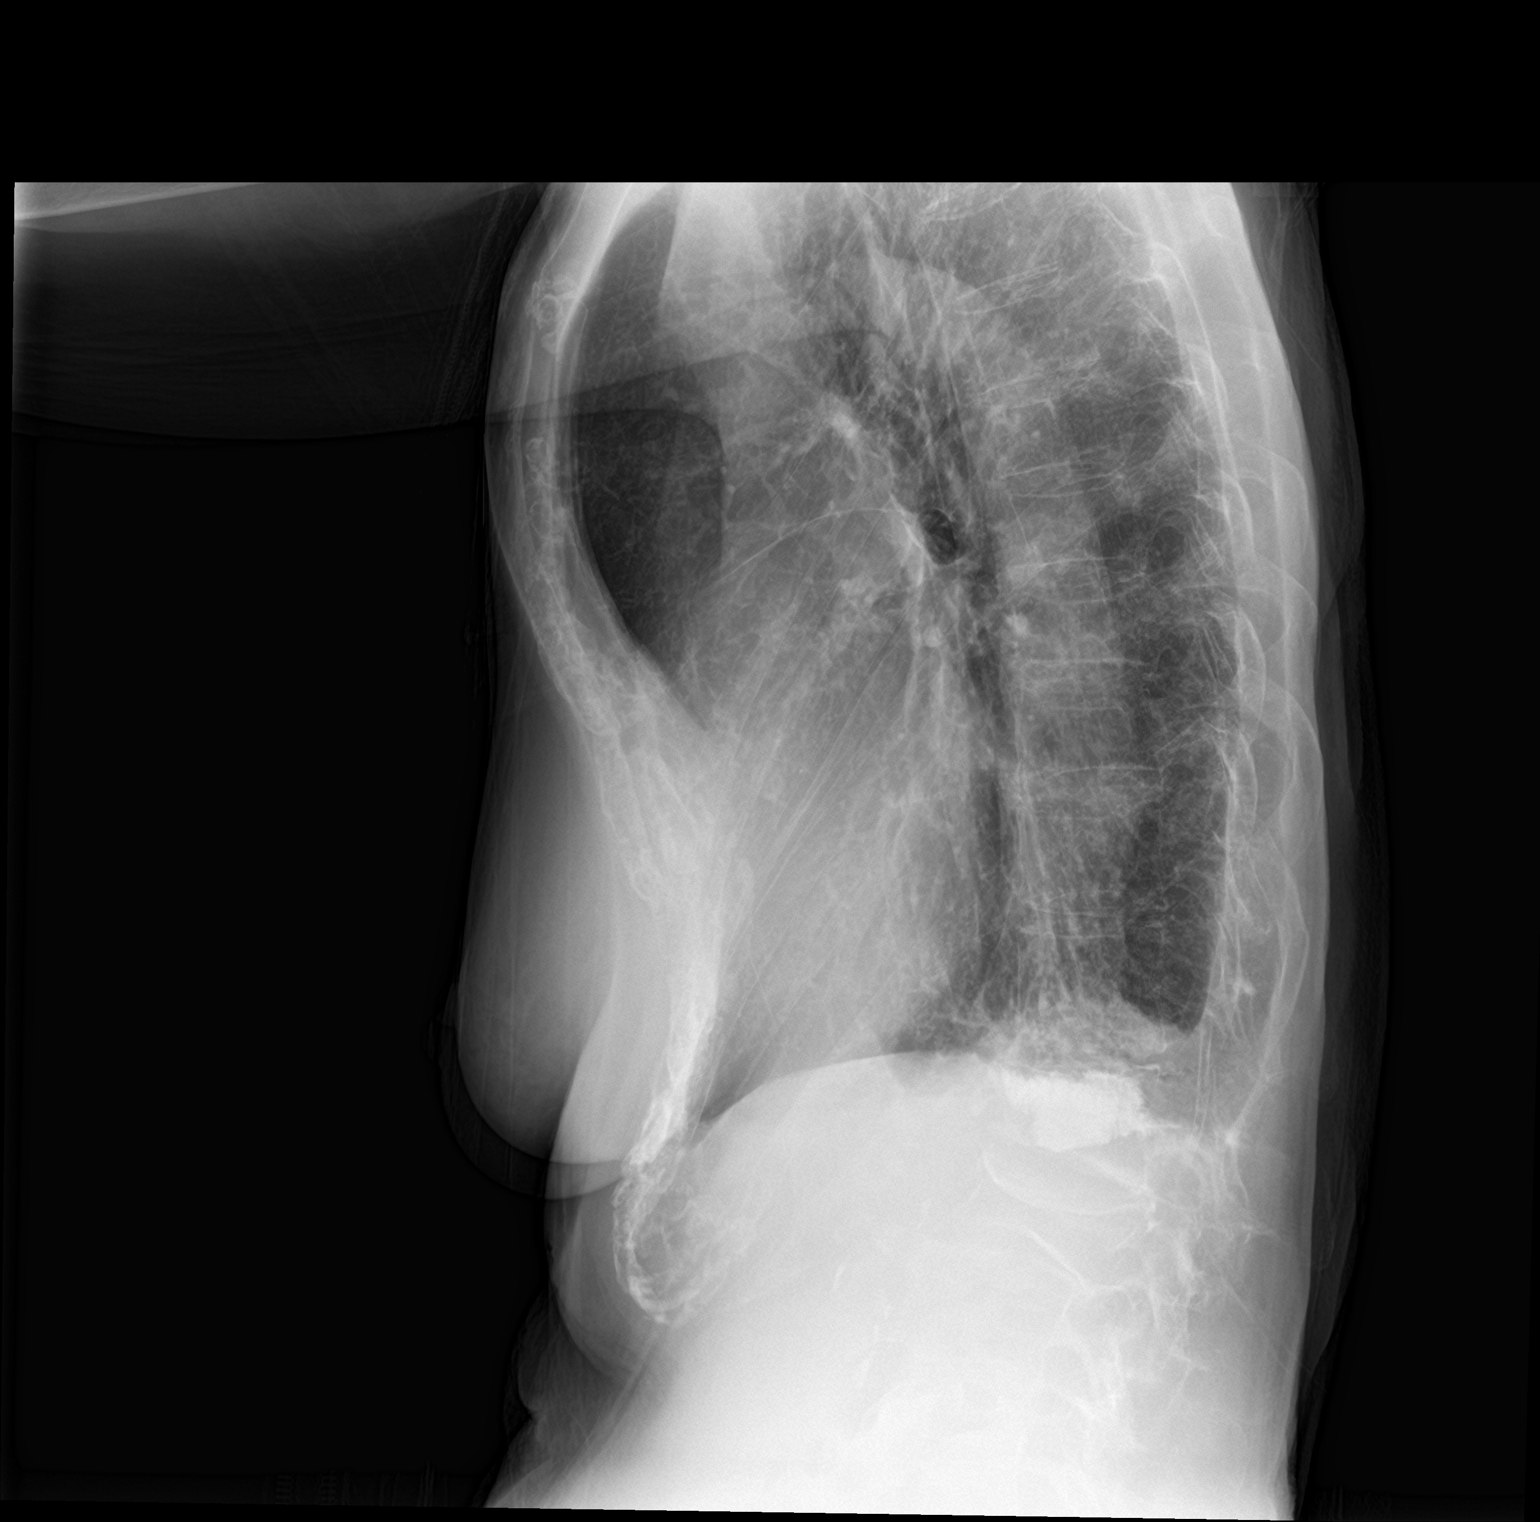

[2 of 2 positions shown; findings below may reference images not displayed]

FINDINGS: Lungs are hyperinflated, suspect background COPD/emphysema. Patchy
left lower lobe retrocardiac bronchovascular opacity compatible left
basilar pneumonia and associated air bronchograms/bronchiectasis.
Minor right base scarring. Apical scarring noted. No superimposed
edema or CHF. Heart is enlarged. Trachea is midline. Aorta
atherosclerotic.
IMPRESSION: Retrocardiac left lower lobe airspace opacity compatible with
pneumonia and underlying chronic bronchiectasis.

Hyperinflation/COPD.

## 2019-01-01 MED ORDER — IOHEXOL 300 MG/ML  SOLN
75.0000 mL | Freq: Once | INTRAMUSCULAR | Status: AC | PRN
  Administered 2019-01-01: 75 mL via INTRAVENOUS
  Filled 2019-01-01: qty 75

## 2019-01-01 MED ORDER — IOHEXOL 240 MG/ML SOLN
25.0000 mL | INTRAMUSCULAR | Status: AC
  Administered 2019-01-01 (×2): 25 mL via ORAL
  Filled 2019-01-01 (×2): qty 25

## 2019-01-01 MED ORDER — AZITHROMYCIN 250 MG PO TABS: ORAL_TABLET | ORAL | 0 refills | Status: AC

## 2019-01-01 MED ORDER — HYDROCODONE-ACETAMINOPHEN 5-325 MG PO TABS
1.0000 | ORAL_TABLET | Freq: Once | ORAL | Status: AC
  Administered 2019-01-01: 1 via ORAL
  Filled 2019-01-01: qty 1

## 2019-01-01 MED ORDER — ALUM & MAG HYDROXIDE-SIMETH 200-200-20 MG/5ML PO SUSP
15.0000 mL | Freq: Once | ORAL | Status: AC
  Administered 2019-01-01: 15 mL via ORAL
  Filled 2019-01-01: qty 30

## 2019-01-01 MED ORDER — FENTANYL CITRATE (PF) 100 MCG/2ML IJ SOLN
12.5000 ug | Freq: Once | INTRAMUSCULAR | Status: AC
  Administered 2019-01-01: 12.5 ug via INTRAVENOUS
  Filled 2019-01-01: qty 2

## 2019-01-01 MED ORDER — AZITHROMYCIN 500 MG PO TABS
500.0000 mg | ORAL_TABLET | Freq: Once | ORAL | Status: AC
  Administered 2019-01-01: 500 mg via ORAL
  Filled 2019-01-01: qty 1

## 2019-01-01 NOTE — ED Notes (Signed)
Patient transported to Ultrasound 

## 2019-01-01 NOTE — ED Provider Notes (Signed)
[facility] Emergency Department Provider Note   ____________________________________________   First MD Initiated Contact with Patient 01/01/19 1540     (approximate)  I have reviewed the triage vital signs and the nursing notes.   HISTORY  Chief Complaint Abdominal Pain    HPI Kaylee Santiago is a 84 y.o. female here for evaluation of abdominal pain.  Woke up about 1 in the morning with abdominal pain.  Hard to describe fairly sharp located in the upper middle abdomen maybe a little more on the right.  No fevers or chills.  She is been in good health, reports her back is been feeling much better after surgery.  Taking hydrocodone occasionally, last took at 1 AM.  She reports pain started out, then kind of went away, then it came back after she had been up this morning talking to friends on the phone.  The pain then went away when about the time she got to the hospital and now starting to recur again located in the upper abdomen.  No chest pain or shortness of breath.  No exposure to coronavirus.  No cough cold or runny nose.   Past Medical History:  Diagnosis Date  . Atrial fibrillation (HCC)   . Atrial fibrillation (HCC)   . Back pain   . Dysrhythmia   . Hypertension   . Hypothyroidism     Patient Active Problem List   Diagnosis Date Noted  . Pneumonia 06/29/2018    Past Surgical History:  Procedure Laterality Date  . ABDOMINAL HYSTERECTOMY    . BREAST EXCISIONAL BIOPSY Right 70's  . CATARACT EXTRACTION W/ INTRAOCULAR LENS  IMPLANT, BILATERAL    . KYPHOPLASTY N/A 12/21/2018   Procedure: KYPHOPLASTY L1;  Surgeon: Kennedy BuckerMenz, Michael, MD;  Location: [facility] ORS;  Service: Orthopedics;  Laterality: N/A;    Prior to Admission medications   Medication Sig Start Date End Date Taking? Authorizing Provider  Biotin 5000 MCG TABS Take 5,000 mcg by mouth daily.    [provider]  Calcium Carb-Cholecalciferol (CALTRATE 600+D3 PO) Take 1 tablet by mouth  daily.    [provider]  HYDROcodone-acetaminophen (NORCO/VICODIN) 5-325 MG tablet Take 1 tablet by mouth every 6 (six) hours as needed for moderate pain.    [provider]  levothyroxine (SYNTHROID, LEVOTHROID) 50 MCG tablet Take 50 mcg by mouth daily before breakfast.  07/25/17   [provider]  LORazepam (ATIVAN) 0.5 MG tablet Take 0.5 mg by mouth at bedtime as needed for sleep.    [provider]  metoprolol succinate (TOPROL-XL) 50 MG 24 hr tablet Take 25-50 mg by mouth See admin instructions. Take 50 mg in the morning and 25 mg in the evening 12/26/17   [provider]  Multiple Vitamin (MULTI-VITAMIN DAILY PO) Take 1 tablet by mouth daily.    [provider]  rivaroxaban (XARELTO) 20 MG TABS tablet Take 20 mg by mouth daily. 07/25/17   [provider]  tiZANidine (ZANAFLEX) 2 MG tablet Take 2 mg by mouth 2 (two) times daily as needed for muscle spasms.  12/11/18   [provider]  vitamin E 400 UNIT capsule Take 400 Units by mouth daily.    [provider]    Allergies Amoxicillin-pot clavulanate and Levofloxacin  Family History  Problem Relation Age of Onset  . Breast cancer Sister 5440  . Breast cancer Sister        3830's  . CAD Mother     Social  History Social History   Tobacco Use  . Smoking status: Former Smoker    Types: Cigarettes  . Smokeless tobacco: Never Used  Substance Use Topics  . Alcohol use: Not Currently  . Drug use: Never    Review of Systems Constitutional: No fever/chills Eyes: No visual changes. ENT: No sore throat. Cardiovascular: Denies chest pain. Respiratory: Denies shortness of breath. Gastrointestinal: See HPI.  Decreased appetite Genitourinary: Negative for dysuria. Musculoskeletal: Negative for back pain except for her chronic discomfort which she reports is actually quite a bit better after her surgery. Skin: Negative for rash. Neurological: Negative for  headaches, areas of focal weakness or numbness.    ____________________________________________   PHYSICAL EXAM:  VITAL SIGNS: ED Triage Vitals  Enc Vitals Group     BP 01/01/19 1507 (!) 182/97     Pulse Rate 01/01/19 1507 85     Resp 01/01/19 1507 18     Temp 01/01/19 1507 98.2 F (36.8 C)     Temp Source 01/01/19 1507 Oral     SpO2 01/01/19 1507 95 %     Weight 01/01/19 1508 118 lb (53.5 kg)     Height 01/01/19 1508 5\' 11"  (1.803 m)     Head Circumference --      Peak Flow --      Pain Score 01/01/19 1508 5     Pain Loc --      Pain Edu? --      Excl. in [place]? --     Constitutional: Alert and oriented. Well appearing and in no acute distress. Eyes: Conjunctivae are normal. Head: Atraumatic. Nose: No congestion/rhinnorhea. Mouth/Throat: Mucous membranes are moist. Neck: No stridor.  Cardiovascular: Normal rate, regular rhythm. Grossly normal heart sounds.  Good peripheral circulation. Respiratory: Normal respiratory effort.  No retractions. Lungs CTAB. Gastrointestinal: Soft and moderately tender in the epigastrium and right upper quadrant.  No rebound or guarding.  Equivocal Murphy.  No focal lower abdominal pain, seems to report palpation the right lower quadrant refers pain towards the right flank right upper quadrant. No distention. Musculoskeletal: No lower extremity tenderness nor edema. Neurologic:  Normal speech and language. No gross focal neurologic deficits are appreciated.  Skin:  Skin is warm, dry and intact. No rash noted. Psychiatric: Mood and affect are normal. Speech and behavior are normal.  ____________________________________________   LABS (all labs ordered are listed, but only abnormal results are displayed)  Labs Reviewed  COMPREHENSIVE METABOLIC PANEL - Abnormal; Notable for the following components:      Result Value   Sodium 130 (*)    Chloride 92 (*)    Glucose, Bld 114 (*)    All other components within normal limits  URINALYSIS,  COMPLETE (UACMP) WITH MICROSCOPIC - Abnormal; Notable for the following components:   Color, Urine STRAW (*)    APPearance CLEAR (*)    Hgb urine dipstick SMALL (*)    All other components within normal limits  LIPASE, BLOOD  CBC   ____________________________________________  EKG  Reviewed enterotomy at 1520 Heart rate 100 QRS 99 QTc 420 Atrial fibrillation, no evidence of ischemia though a slight T wave inversion is noted V2 which is persistent and unchanged from previous EKG ____________________________________________  RADIOLOGY  US Abdomen Limited Ruq  Result Date: 01/01/2019 CLINICAL DATA:  Abdominal pain. EXAM: ULTRASOUND ABDOMEN LIMITED RIGHT UPPER QUADRANT COMPARISON:  CT abdomen pelvis dated December 11, 2008. FINDINGS: Gallbladder: No gallstones or wall thickening visualized. No sonographic Murphy sign noted by sonographer. Common  bile duct: Diameter: 3 mm, normal. Liver: 8 mm hyperechoic lesion in the right hepatic lobe. Within normal limits in parenchymal echogenicity. Portal vein is patent on color Doppler imaging with normal direction of blood flow towards the liver. IMPRESSION: 1. No acute abnormality. 2. Small 8 mm hyperechoic lesion in the right lobe of the liver, probably a hemangioma, benign. If definitive characterization is required, consider follow-up MRI abdomen without and with MultiHance in 6 months. Electronically Signed   By: Obie DredgeWilliam T Derry M.D.   On: 01/01/2019 16:50   CT abdomen pelvis pending at time of signout to Dr. Juliette AlcideMelinda ____________________________________________   PROCEDURES  Procedure(s) performed: None  Procedures  Critical Care performed: No  ____________________________________________   INITIAL IMPRESSION / ASSESSMENT AND PLAN / ED COURSE  Pertinent labs & imaging results that were available during my care of the patient were reviewed by me and considered in my medical decision making (see chart for details).   No infectious symptoms.   No chest pain.  No trouble breathing.  Fully alert ambulatory but having intermittent bouts of epigastric and some element right upper quadrant domino pain.  Some nausea that is gone away and no vomiting.  Normal bowel movement once daily, does not feel constipated.  Reassuring abdominal exam except for notable amount of tenderness in the epigastrium and right upper quadrant.  Will start with ultrasound right upper quadrant, patient did a fair amount of pain during the waves of discomfort and does request pain medication for which we discussed risks and benefits and will proceed with fentanyl  Differential diagnosis includes but is not limited to, abdominal perforation, aortic dissection, cholecystitis, appendicitis, diverticulitis, colitis, esophagitis/gastritis, kidney stone, pyelonephritis, urinary tract infection, aortic aneurysm. All are considered in decision and treatment plan. Based upon the patient's presentation and risk factors, will start with ultrasound.  Patient has no cardiopulmonary or neurologic complaints.  Vascularly intact distally with strong dorsalis pedis bilateral.  Lumbar spine surgical site appears normal, she reports good improvement in her pain after surgery and I doubt there is any acute concerns regarding her surgical site.  No distal neurologic symptoms   Clinical Course as of Jan 01 1916  Mon Jan 01, 2019  1716 Resting comfortably, reports her pain is much better after pain medicine.  Awaiting CT scan to further evaluate for cause the pain   [MQ]  1916 Patient pain well controlled.  Reports she feels much better.  Thus far work-up reassuring, slight hyponatremia appears to be chronic.  No cardiopulmonary symptoms.  Ongoing care assigned to Dr. Juliette AlcideMelinda, follow-up CT abdomen pelvis.   [MQ]    Clinical Course User Index [MQ] Sharyn CreamerQuale, Andoni Busch, MD     ____________________________________________   FINAL CLINICAL IMPRESSION(S) / ED DIAGNOSES  Final diagnoses:  Abdominal  pain        Note:  This document was prepared using Dragon voice recognition software and may include unintentional dictation errors       Sharyn CreamerQuale, Ova Meegan, MD 01/01/19 [id number]

## 2019-01-01 NOTE — ED Notes (Signed)
Pt reports she has hx of afib  Pt states that if she could "burp" she would feel better - denies SHOB Pt had back surgery Friday a week ago

## 2019-01-01 NOTE — ED Triage Notes (Addendum)
Patient presents today with epigastric pain. Denies N/V/D. No relief with OTC meds. Patient reports Recently had back surgery and given Vicodin for pain. Takes xarelto daily. HX Afib

## 2019-01-01 NOTE — Discharge Instructions (Addendum)
Please follow-up with Dr. Ubaldo Glassing and have him look at your right atrium which look dilated on the CT we had today.  Also please follow-up with your primary care doctor and have them keep an eye on this pneumonia that you have on the CT as well.  I will give you some Zithromax to take for the pneumonia.  Please return for increasing pain, fever, shortness of breath or feeling sicker.  You can use your Vicodin if needed for the pain.  If Tums or Maalox or Mylanta help with the pain do not hesitate to use them and let your doctor know.

## 2019-01-01 NOTE — Telephone Encounter (Signed)
Pt is a pt of Dr Caryl Comes at [facility]. Called pt and left message with Dr Olin Pia number.

## 2019-01-01 NOTE — ED Notes (Signed)
Patient transported to CT 

## 2019-01-25 ENCOUNTER — Other Ambulatory Visit: Payer: Self-pay | Admitting: Internal Medicine

## 2019-01-25 DIAGNOSIS — R9389 Abnormal findings on diagnostic imaging of other specified body structures: Secondary | ICD-10-CM

## 2019-02-01 ENCOUNTER — Ambulatory Visit
Admission: RE | Admit: 2019-02-01 | Discharge: 2019-02-01 | Disposition: A | Discharge status: Home / Self Care | Payer: Medicare Other | Source: Ambulatory Visit | Attending: Internal Medicine | Admitting: Internal Medicine

## 2019-02-01 ENCOUNTER — Other Ambulatory Visit: Payer: Self-pay

## 2019-02-01 DIAGNOSIS — R9389 Abnormal findings on diagnostic imaging of other specified body structures: Secondary | ICD-10-CM

## 2019-02-01 IMAGING — CT CT CHEST WITH CONTRAST
2 of 4 series · 15 of 36 positions shown, 18 images · IV contrast (omnipaque)
Comparison: CT chest [DATE].  CT abdomen and pelvis [DATE].

CLINICAL DATA: Abnormal appearance the left lung base on prior
abdomen and pelvis CT scan.

EXAM:
CT CHEST WITH CONTRAST
TECHNIQUE: Multidetector CT imaging of the chest was performed during
intravenous contrast administration.
CONTRAST:  75 mL OMNIPAQUE IOHEXOL 300 MG/ML  SOLN

[Series 2: axial chest · axial · 0.58mm/px · z∈[-1293,-1009]mm · 12 of 168 slices shown, 15 images]
[im 13/168  mediastinal]
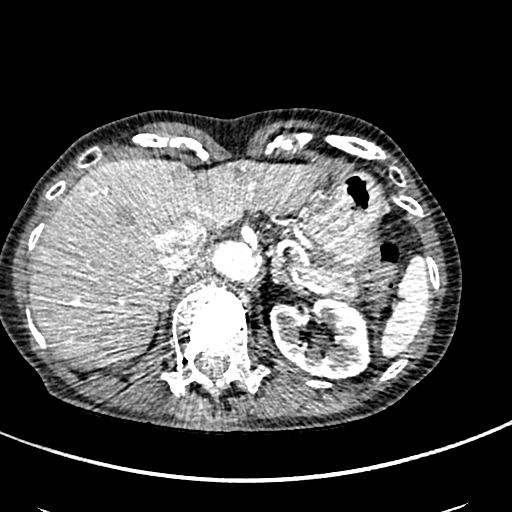
[im 13/168  lung]
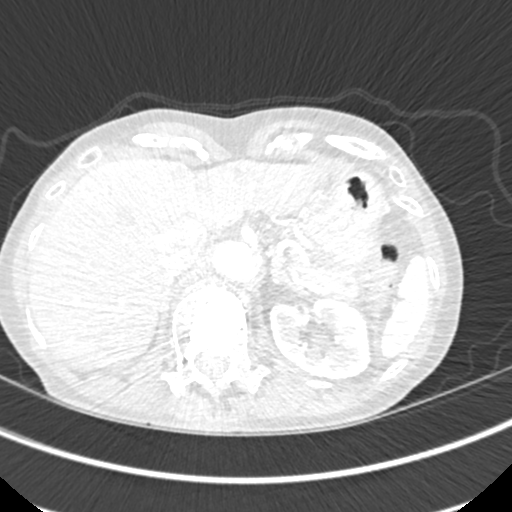
[im 26/168  lung]
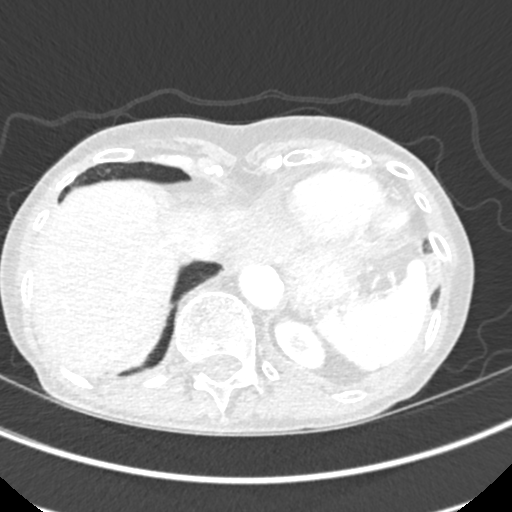
[im 39/168  lung]
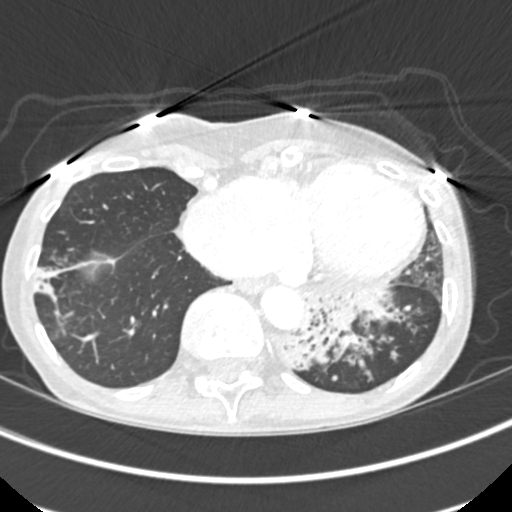
[im 52/168  lung]
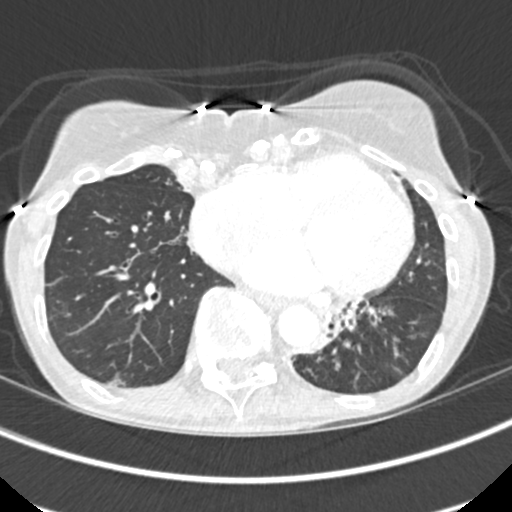
[im 65/168  mediastinal]
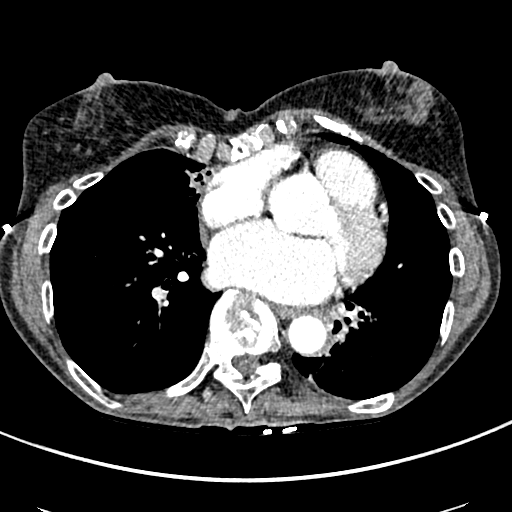
[im 65/168  lung]
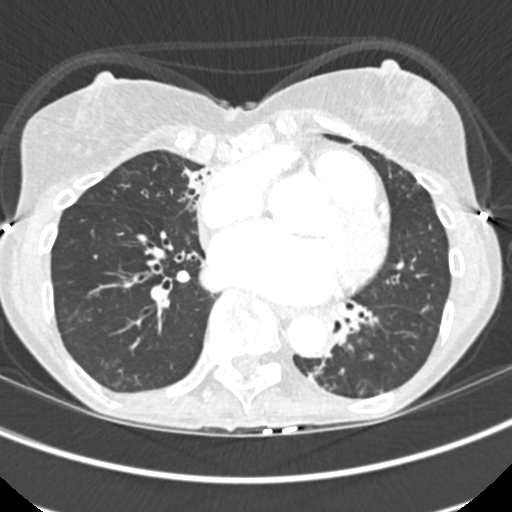
[im 78/168  lung]
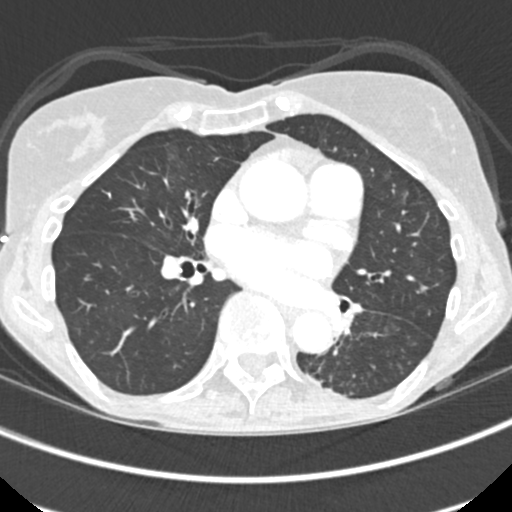
[im 90/168  lung]
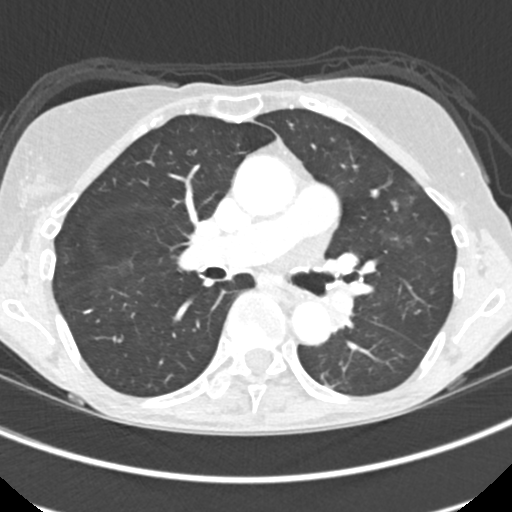
[im 103/168  lung]
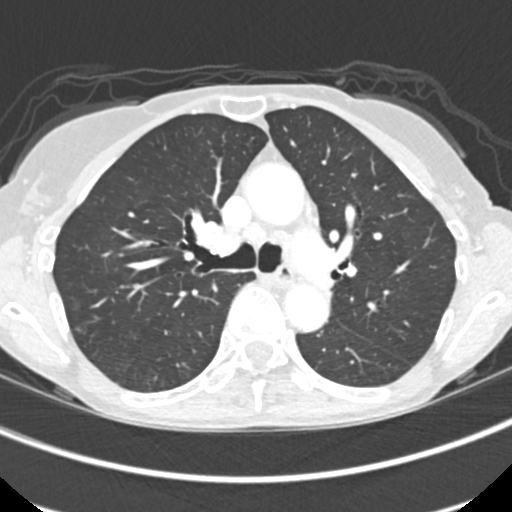
[im 116/168  mediastinal]
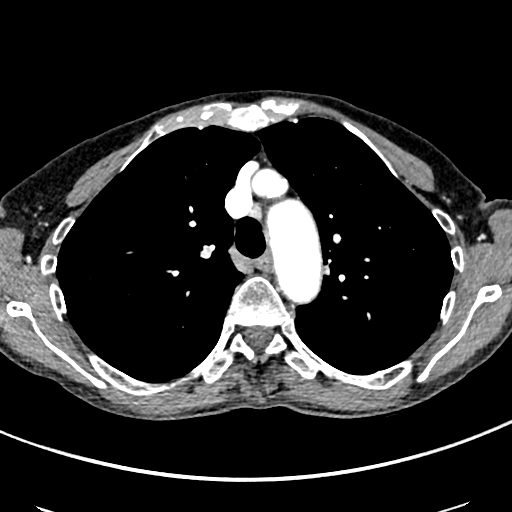
[im 116/168  lung]
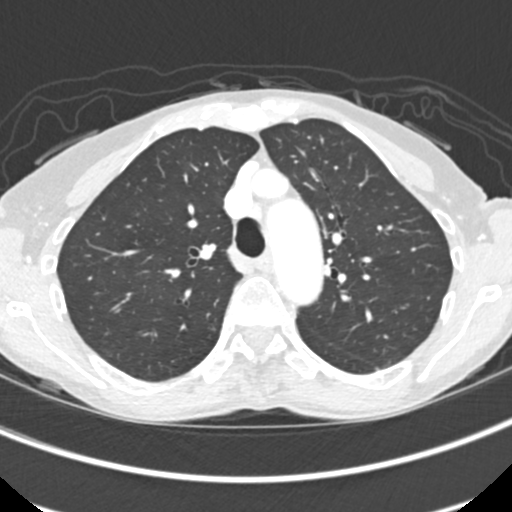
[im 129/168  lung]
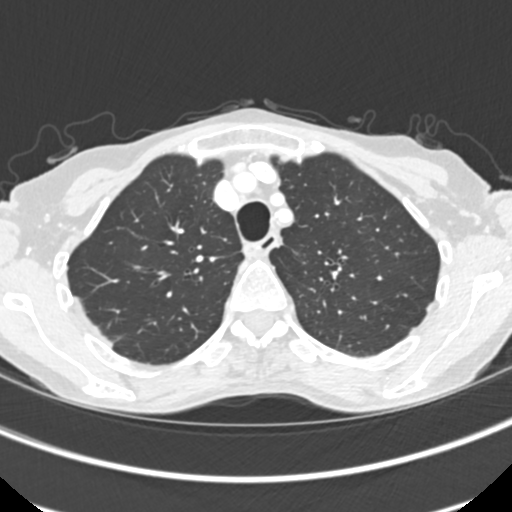
[im 142/168  lung]
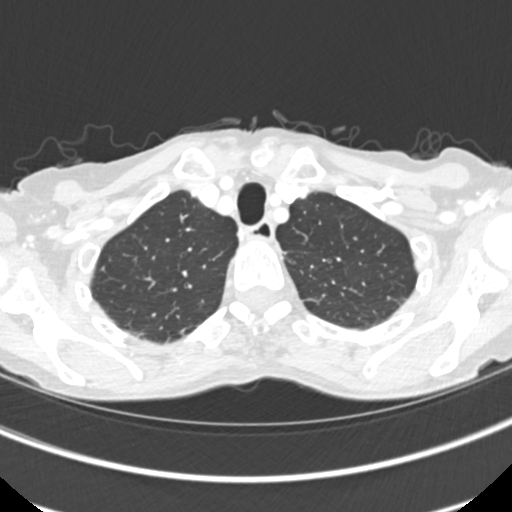
[im 155/168  lung]
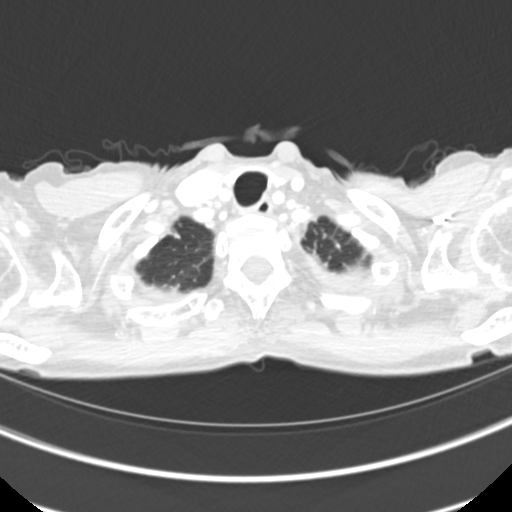

[Series 4: coronal chest · coronal · 0.58mm/px · 3 of 147 slices shown]
[im 30/147  lung]
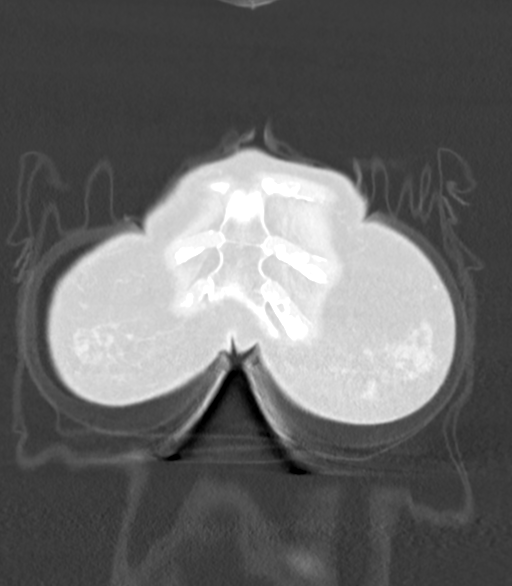
[im 59/147  lung]
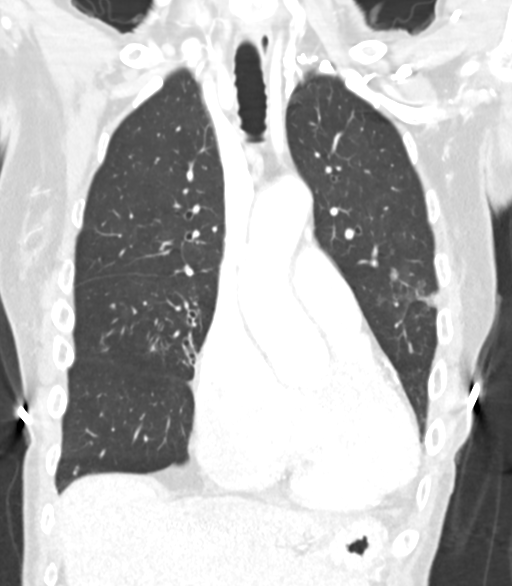
[im 88/147  lung]
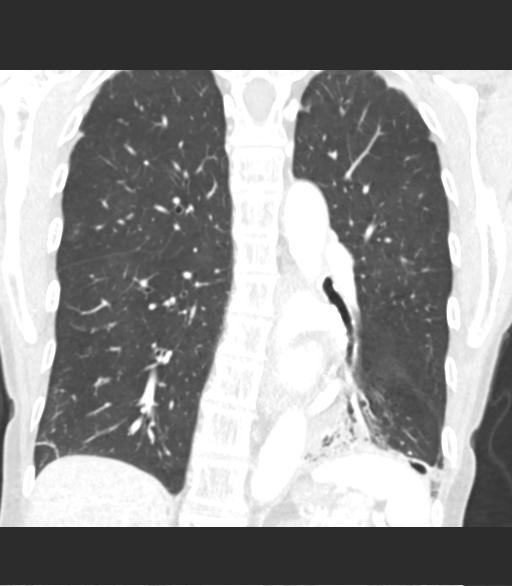

[15 of 36 positions shown; findings below may reference images not displayed]

FINDINGS: Cardiovascular: There is cardiomegaly. No pericardial effusion.
Dilated left atrium noted as seen on prior abdomen and pelvis CT
scan.

Mediastinum/Nodes: No enlarged mediastinal, hilar, or axillary lymph
nodes. Thyroid gland, trachea, and esophagus demonstrate no
significant findings.

Lungs/Pleura: No pleural effusion. Bronchiectasis is seen in the
right middle lobe and lower lobes bilaterally. Changes are worst in
the left lower lobe where there is associated airspace opacity.
Mucous plugging is present. These findings are seen on the prior
chest CT but have worsened in the left lung base since that study.

Upper Abdomen: No acute abnormality.  Atherosclerosis noted.

Musculoskeletal: No acute abnormality or focal lesion. The patient
is status post L1 vertebral augmentation. There is partial
visualization of an L2 compression fracture.
IMPRESSION: Chronic right middle and bilateral lower lobe bronchiectasis has
worsened in the left lower lobe with new airspace opacity and mucous
plugging since the [L2] chest CT. The appearance is most suggestive
of chronic atypical infection such as JERMAINE. Coexistent acute
pneumonia in the left lung base is possible but thought unlikely.

Cardiomegaly.

Aortic Atherosclerosis ([L2]-[L2]).

## 2019-02-01 MED ORDER — IOHEXOL 300 MG/ML  SOLN
75.0000 mL | Freq: Once | INTRAMUSCULAR | Status: AC | PRN
  Administered 2019-02-01: 75 mL via INTRAVENOUS

## 2019-06-13 DIAGNOSIS — J479 Bronchiectasis, uncomplicated: Secondary | ICD-10-CM | POA: Insufficient documentation

## 2020-03-19 ENCOUNTER — Other Ambulatory Visit: Payer: Self-pay | Admitting: Internal Medicine

## 2020-03-19 DIAGNOSIS — J849 Interstitial pulmonary disease, unspecified: Secondary | ICD-10-CM

## 2020-03-26 ENCOUNTER — Other Ambulatory Visit: Payer: Self-pay

## 2020-03-26 ENCOUNTER — Other Ambulatory Visit: Payer: Self-pay | Admitting: Internal Medicine

## 2020-03-26 ENCOUNTER — Ambulatory Visit
Admission: RE | Admit: 2020-03-26 | Discharge: 2020-03-26 | Disposition: A | Discharge status: Home / Self Care | Payer: Medicare Other | Source: Ambulatory Visit | Attending: Internal Medicine | Admitting: Internal Medicine

## 2020-03-26 DIAGNOSIS — J849 Interstitial pulmonary disease, unspecified: Secondary | ICD-10-CM | POA: Insufficient documentation

## 2020-03-26 IMAGING — CT CT CHEST HIGH RESOLUTION W/O CM
2 of 5 series · 15 of 36 positions shown, 18 images · non-contrast
Comparison: [DATE] chest CT.

CLINICAL DATA: Cough.  COPD.  Follow-up interstitial lung disease.

EXAM:
CT CHEST WITHOUT CONTRAST
TECHNIQUE: Multidetector CT imaging of the chest was performed following the
standard protocol without intravenous contrast. High resolution
imaging of the lungs, as well as inspiratory and expiratory imaging,
was performed.

[Series 2: thorax · axial · 0.57mm/px · z∈[-331,-37]mm · 12 of 161 slices shown, 15 images]
[im 7/161  mediastinal]
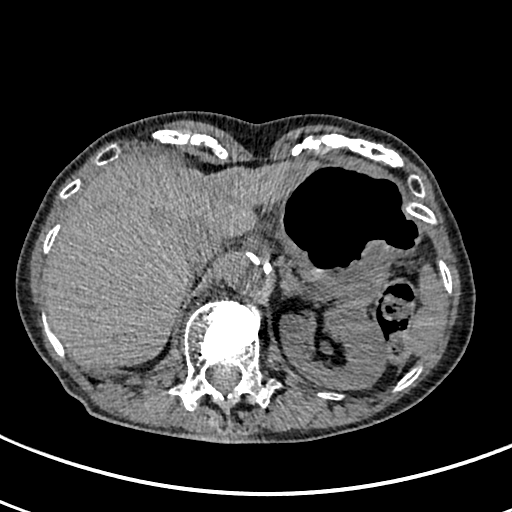
[im 7/161  lung]
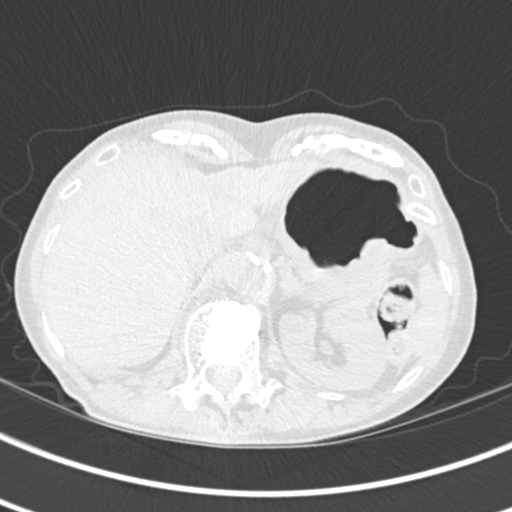
[im 21/161  lung]
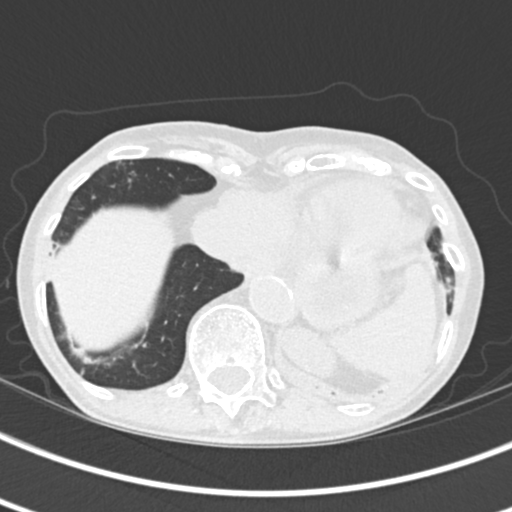
[im 35/161  lung]
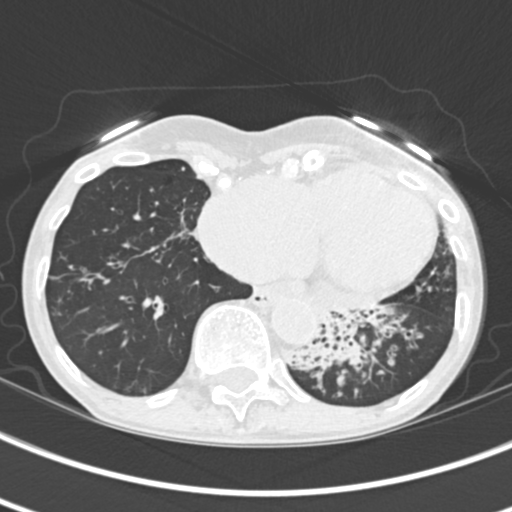
[im 49/161  lung]
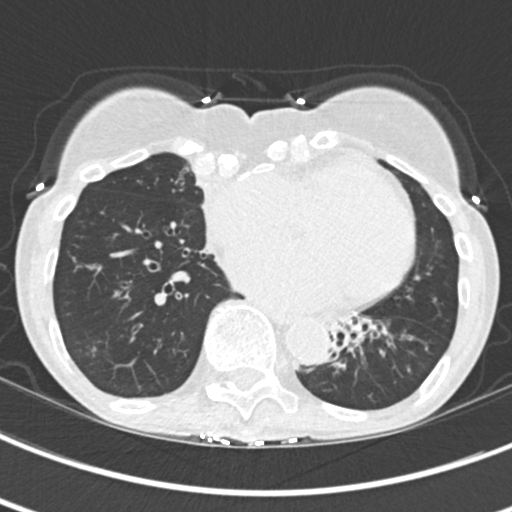
[im 63/161  mediastinal]
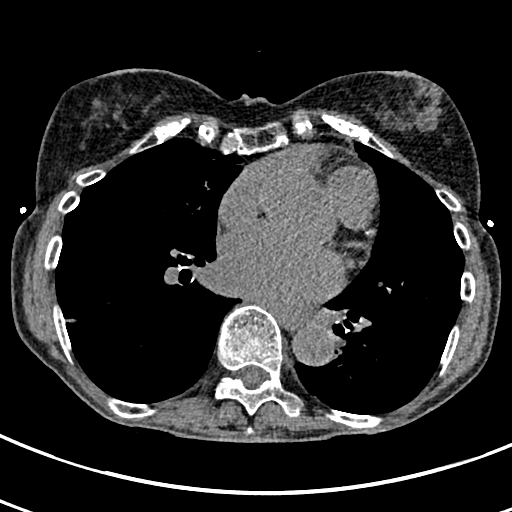
[im 63/161  lung]
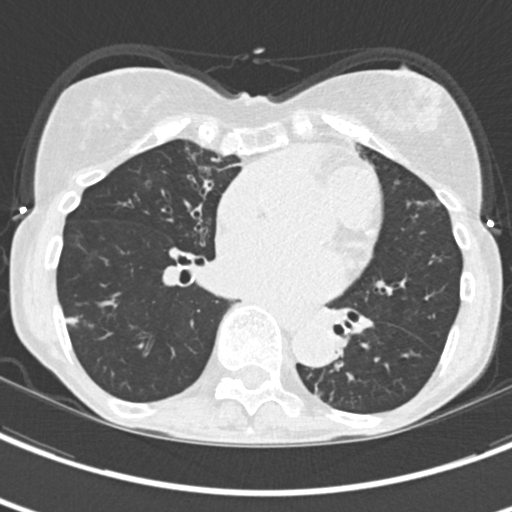
[im 77/161  lung]
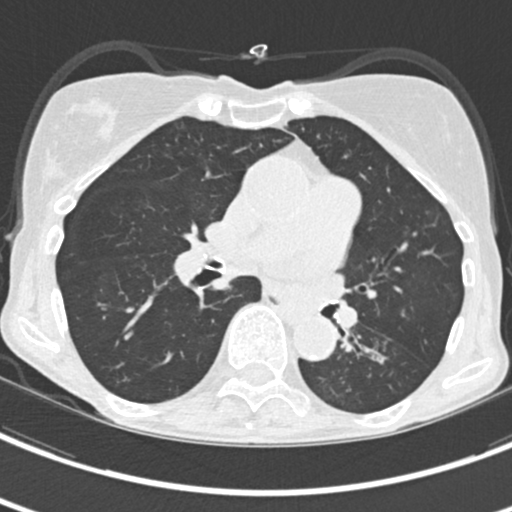
[im 84/161  lung]
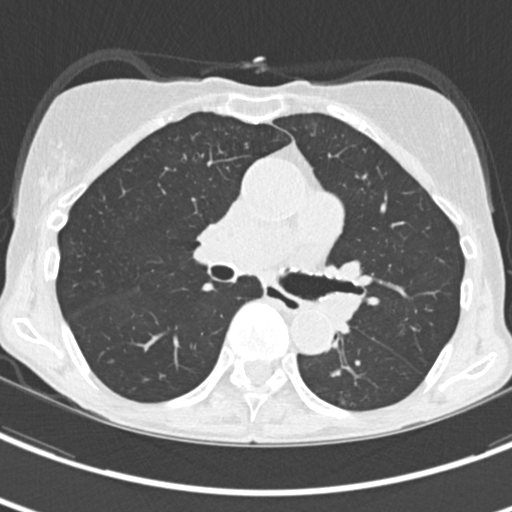
[im 98/161  lung]
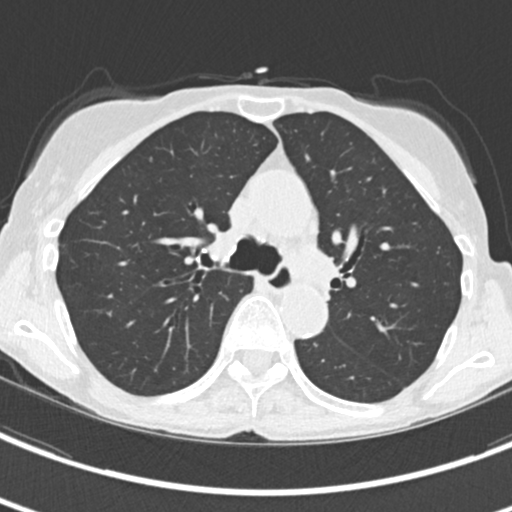
[im 112/161  mediastinal]
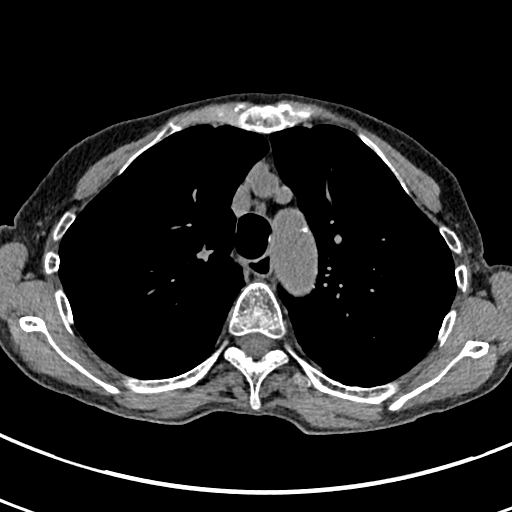
[im 112/161  lung]
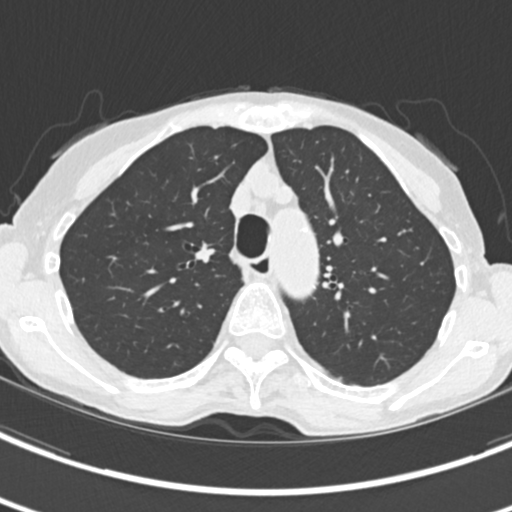
[im 126/161  lung]
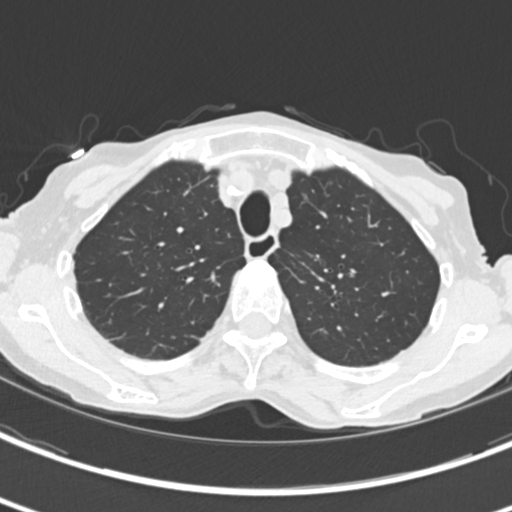
[im 140/161  lung]
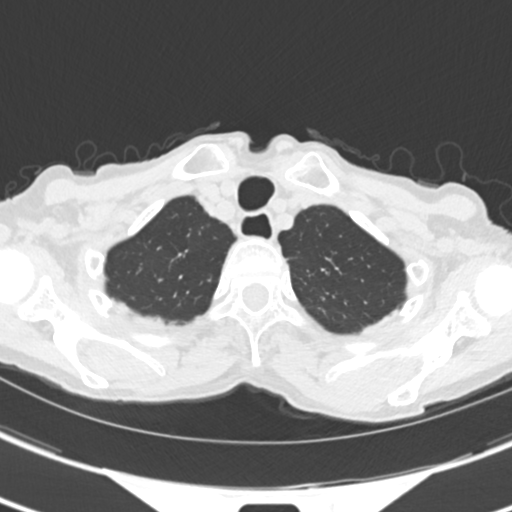
[im 154/161  lung]
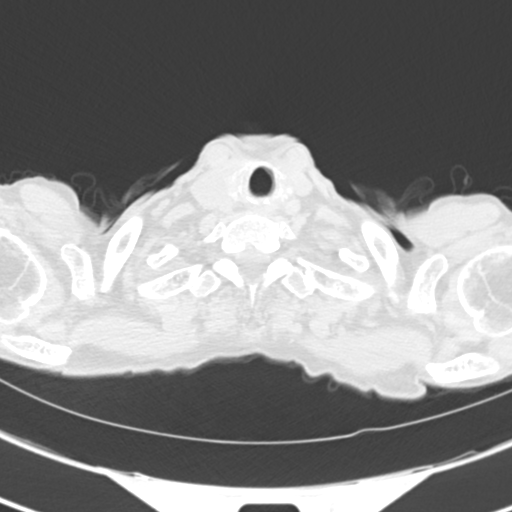

[Series 8: coronal · coronal · 0.58mm/px · 3 of 71 slices shown]
[im 15/71  lung]
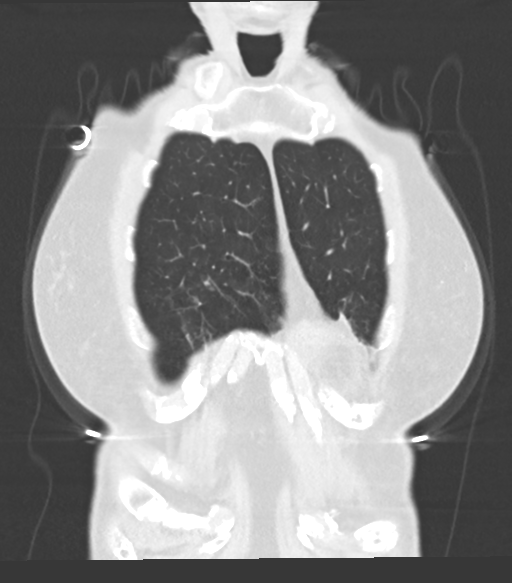
[im 29/71  lung]
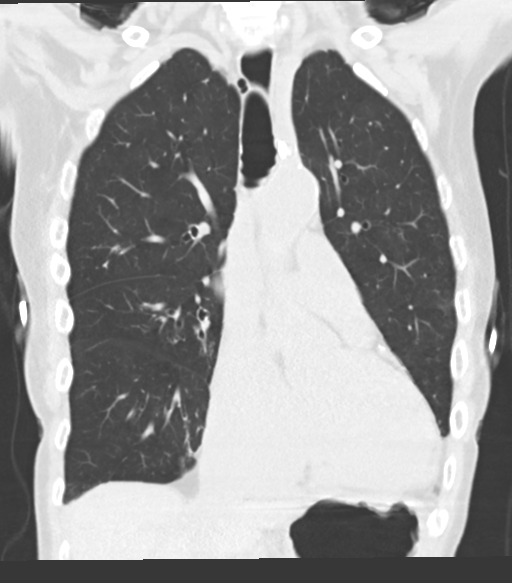
[im 43/71  lung]
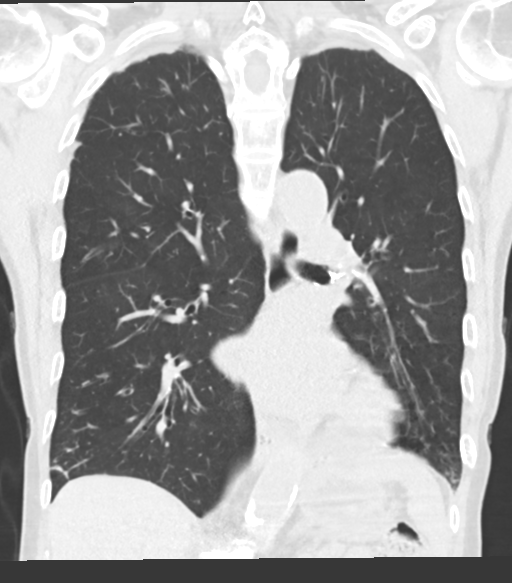

[15 of 36 positions shown; findings below may reference images not displayed]

FINDINGS: Cardiovascular: Borderline mild cardiomegaly, stable. No significant
pericardial effusion/thickening. Left anterior descending coronary
atherosclerosis. Atherosclerotic nonaneurysmal thoracic aorta.
Normal caliber pulmonary arteries.

Mediastinum/Nodes: No discrete thyroid nodules. Unremarkable
esophagus. No pathologically enlarged axillary, mediastinal or hilar
lymph nodes, noting limited sensitivity for the detection of hilar
adenopathy on this noncontrast study.

Lungs/Pleura: No pneumothorax. No pleural effusion. Moderate
cylindrical and varicoid bronchiectasis throughout both lungs, most
prominent in the right middle lobe, lingula and left greater than
right lower lobes, similar to slightly worsened. Chronic thick
bandlike consolidation and volume loss in the medial basilar left
lower lobe is unchanged. Patchy tree-in-bud opacities and scattered
mucoid impaction at the areas of bronchiectasis, most prominent in
the left lower lobe, slightly worsened in the right lower lobe and
otherwise not significantly changed. No lung masses, central airway
stenoses or significant pulmonary nodules.

Upper abdomen: No acute abnormality.

Musculoskeletal: No aggressive appearing focal osseous lesions.
Stable chronic mild L1 vertebral compression fracture status post
vertebroplasty. Mild thoracic spondylosis.
IMPRESSION: 1. Chronic moderate cylindrical and varicoid bronchiectasis in both
lungs, most prominent in the mid to lower lungs, with associated
patchy tree-in-bud opacities, stable to slightly worsened in the
interval. Findings suggest recurrent aspiration or atypical
mycobacterial infection (ARVIND).
2. Chronic thick bandlike consolidation and volume loss in the
medial basilar left lower lobe, stable.
3. One vessel coronary atherosclerosis.
4. Borderline mild cardiomegaly.
5. Aortic Atherosclerosis ([3V]-[3V]).

## 2021-01-05 ENCOUNTER — Other Ambulatory Visit: Payer: Self-pay | Admitting: Physical Medicine and Rehabilitation

## 2021-01-05 DIAGNOSIS — M5416 Radiculopathy, lumbar region: Secondary | ICD-10-CM

## 2021-01-14 ENCOUNTER — Ambulatory Visit
Admission: RE | Admit: 2021-01-14 | Discharge: 2021-01-14 | Disposition: A | Discharge status: Home / Self Care | Payer: Medicare Other | Source: Ambulatory Visit | Attending: Physical Medicine and Rehabilitation | Admitting: Physical Medicine and Rehabilitation

## 2021-01-14 ENCOUNTER — Other Ambulatory Visit: Payer: Self-pay

## 2021-01-14 DIAGNOSIS — M5416 Radiculopathy, lumbar region: Secondary | ICD-10-CM | POA: Insufficient documentation

## 2021-01-14 IMAGING — MR MR LUMBAR SPINE W/O CM
4 of 5 series · 33 of 48 positions shown · non-contrast
Comparison: [DATE]

CLINICAL DATA: Chronic low back pain

EXAM:
MRI LUMBAR SPINE WITHOUT CONTRAST
TECHNIQUE: Multiplanar, multisequence MR imaging of the lumbar spine was
performed. No intravenous contrast was administered.

[Series 5: T2 · sagittal · 4.0mm · 0.81mm/px · 8 of 17 slices shown (1 of 2)]
[im 1/17]
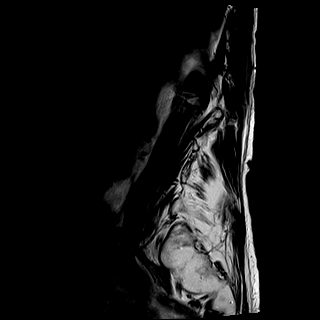
[im 3/17]
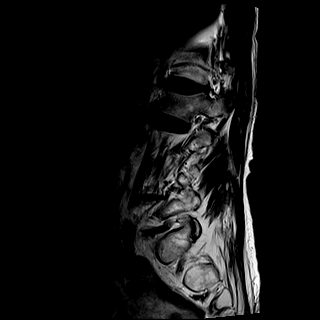
[im 5/17]
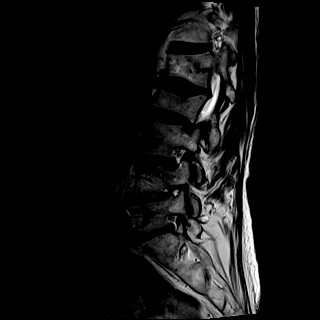
[im 7/17]
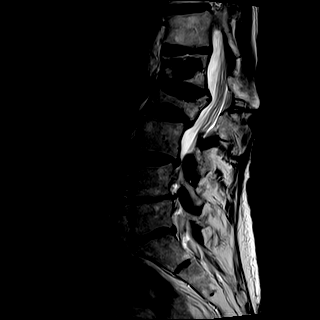
[im 10/17]
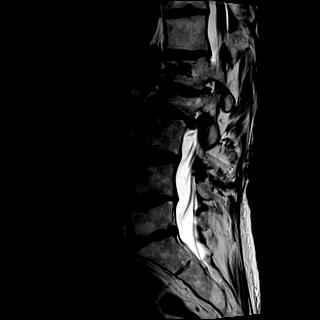
[im 12/17]
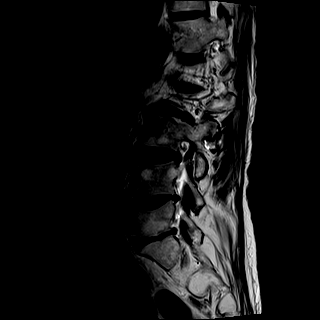
[im 14/17]
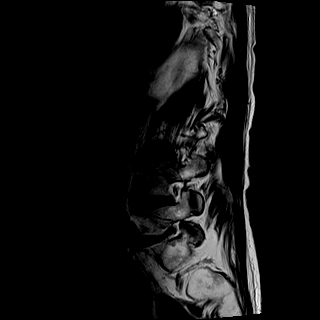
[im 17/17]
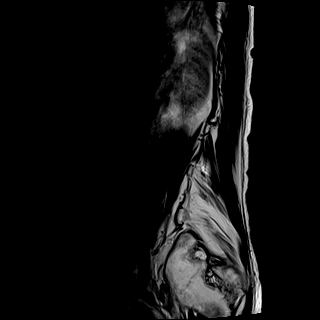

[Series 6: T1 · sagittal · 4.0mm · 0.81mm/px · 7 of 17 slices shown (1 of 2)]
[im 1/17]
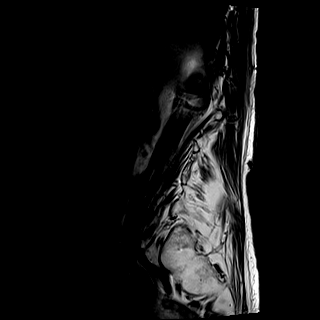
[im 3/17]
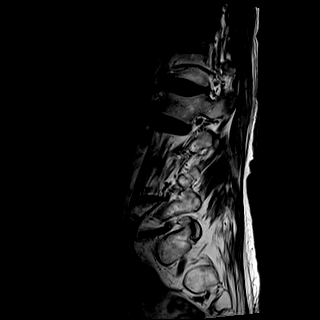
[im 6/17]
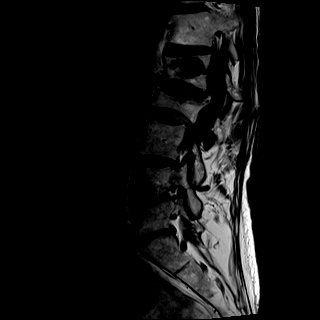
[im 9/17]
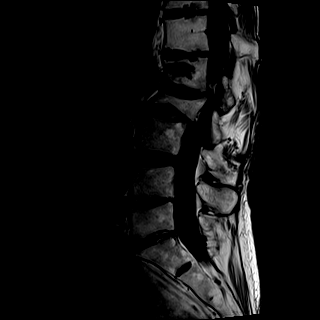
[im 11/17]
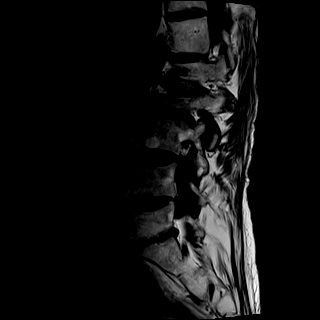
[im 14/17]
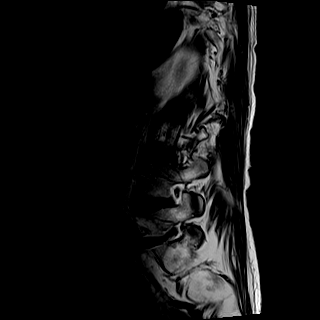
[im 17/17]
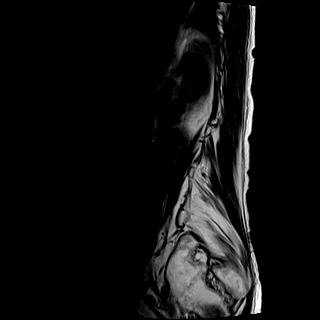

[Series 8: T2 · axial · 4.0mm · 0.78mm/px · z∈[-90,+102]mm · 9 of 32 slices shown (2 of 2)]
[im 1/32]
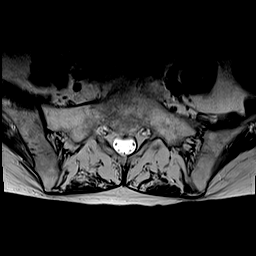
[im 6/32]
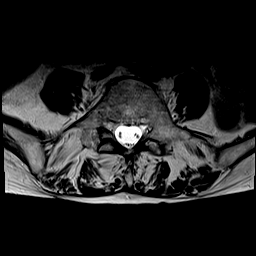
[im 11/32]
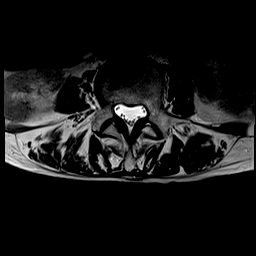
[im 13/32]
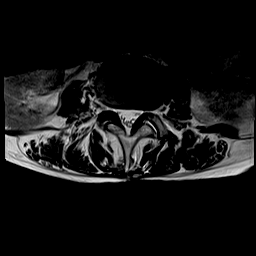
[im 16/32]
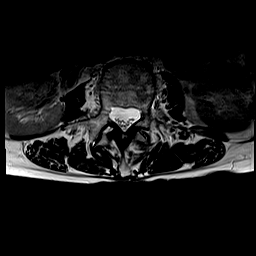
[im 19/32]
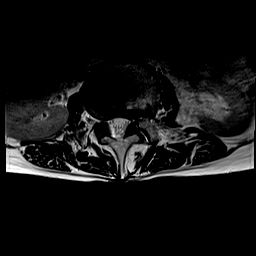
[im 21/32]
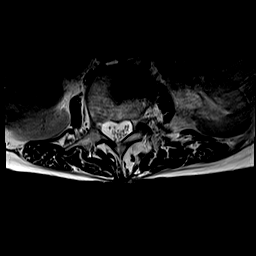
[im 26/32]
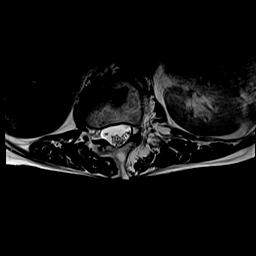
[im 32/32]
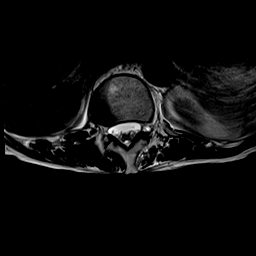

[Series 10: T1 · axial · 4.0mm · 0.39mm/px · z∈[-90,+102]mm · 9 of 32 slices shown (2 of 2)]
[im 1/32]
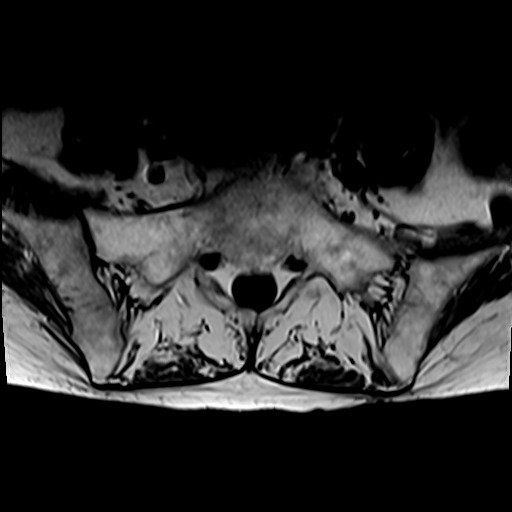
[im 6/32]
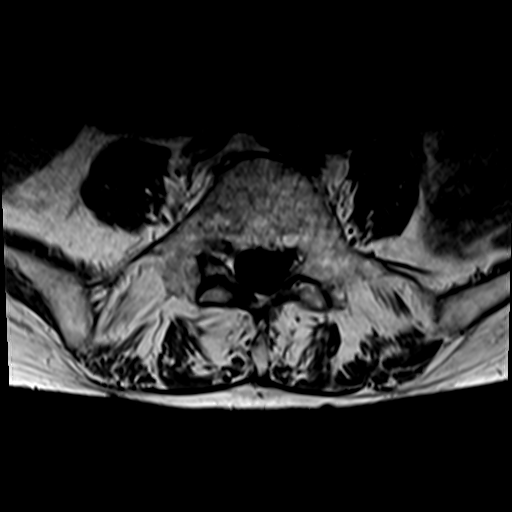
[im 11/32]
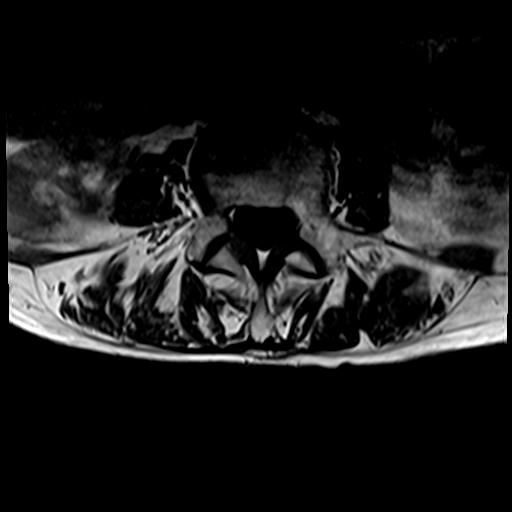
[im 13/32]
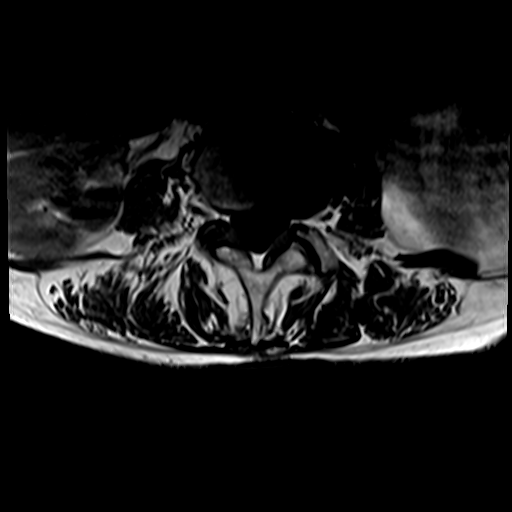
[im 16/32]
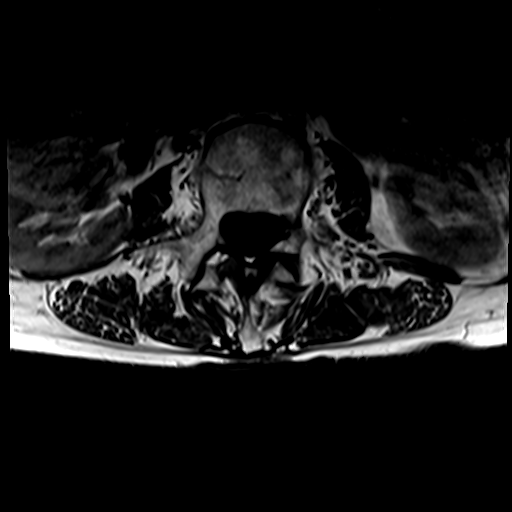
[im 19/32]
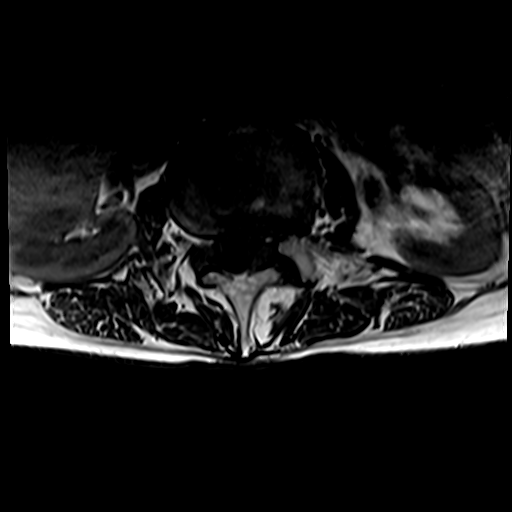
[im 21/32]
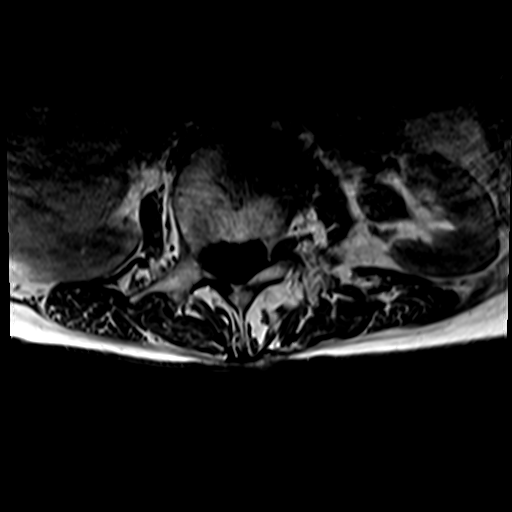
[im 26/32]
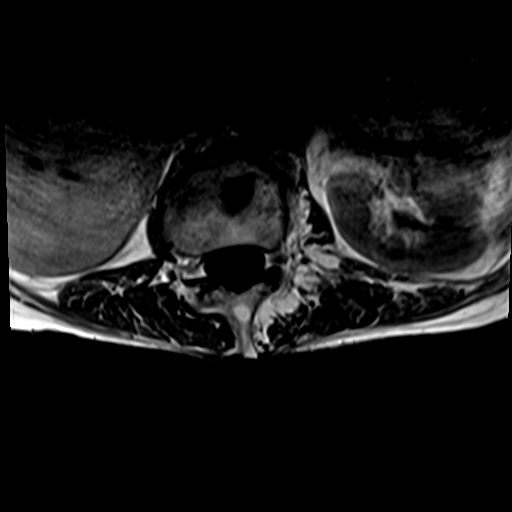
[im 32/32]
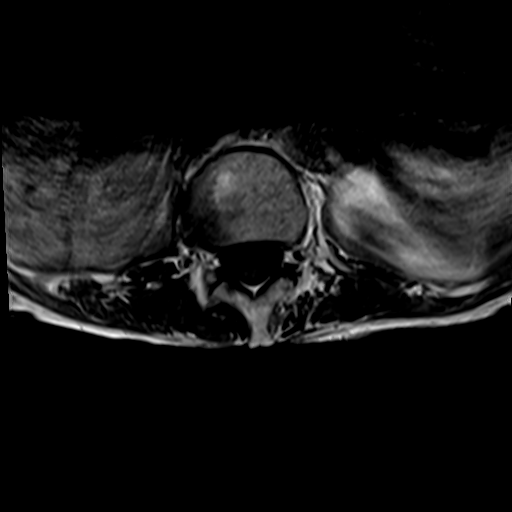

[33 of 48 positions shown; findings below may reference images not displayed]

FINDINGS: Segmentation:  Standard

Alignment:  Grade 1 retrolisthesis at L2-3

Vertebrae: Chronic compression fractures of L1 and L2. Status post
L1 vertebral augmentation. At L2, there is approximately 70% central
height loss with 4 mm of retropulsion.

Conus medullaris and cauda equina: Conus extends to the L1 level.
Conus and cauda equina appear normal.

Paraspinal and other soft tissues: Negative

Disc levels:

L1-L2: There is retropulsion of the posterosuperior corner of L2. No
spinal canal stenosis. Unchanged mild left neural foraminal
stenosis.

L2-L3: Small disc bulge, left asymmetric. No spinal canal stenosis.
No neural foraminal stenosis.

L3-L4: Small disc bulge. Facets are normal. No spinal canal
stenosis. No neural foraminal stenosis.

L4-L5: Small disc bulge with mild facet hypertrophy. No spinal canal
stenosis. Unchanged moderate right neural foraminal stenosis.

L5-S1: Disc space narrowing without herniation. No spinal canal
stenosis. Unchanged mild bilateral neural foraminal stenosis.

Visualized sacrum: Normal.
IMPRESSION: 1. Chronic compression fractures of L1 and L2 with approximately 70%
central height loss and 4 mm of retropulsion. Status post L1
vertebral augmentation.
2. Unchanged multilevel mild-to-moderate neural foraminal stenosis,
worst at right L4-5.
3. No spinal canal stenosis.

## 2021-03-20 ENCOUNTER — Other Ambulatory Visit: Payer: Self-pay | Admitting: Internal Medicine

## 2021-03-20 DIAGNOSIS — R1084 Generalized abdominal pain: Secondary | ICD-10-CM

## 2021-03-20 DIAGNOSIS — I10 Essential (primary) hypertension: Secondary | ICD-10-CM

## 2021-04-08 ENCOUNTER — Other Ambulatory Visit: Payer: Self-pay

## 2021-04-08 ENCOUNTER — Ambulatory Visit
Admission: RE | Admit: 2021-04-08 | Discharge: 2021-04-08 | Disposition: A | Discharge status: Home / Self Care | Payer: Medicare Other | Source: Ambulatory Visit | Attending: Internal Medicine | Admitting: Internal Medicine

## 2021-04-08 DIAGNOSIS — I10 Essential (primary) hypertension: Secondary | ICD-10-CM | POA: Diagnosis present

## 2021-04-08 DIAGNOSIS — R1084 Generalized abdominal pain: Secondary | ICD-10-CM | POA: Insufficient documentation

## 2021-04-08 IMAGING — CT CT ABD-PELV W/ CM
2 of 5 series · 16 of 46 positions shown, 18 images · IV contrast (omnipaque)
Comparison: [DATE]

CLINICAL DATA: Intermittent diarrhea mid lower abdominal pain for 1
month. History atrial fibrillation, hypertension, former smoker

EXAM:
CT ABDOMEN AND PELVIS WITH CONTRAST
TECHNIQUE: Multidetector CT imaging of the abdomen and pelvis was performed
using the standard protocol following bolus administration of
intravenous contrast. Sagittal and coronal MPR images reconstructed
from axial data set.
CONTRAST:  75mL OMNIPAQUE IOHEXOL 350 MG/ML SOLN IV. Dilute oral
contrast.

[Series 2: abd pelvis 5.00 · axial · 0.67mm/px · z∈[-1498,-1133]mm · 13 of 83 slices shown, 15 images]
[im 5/83  soft-tissue]
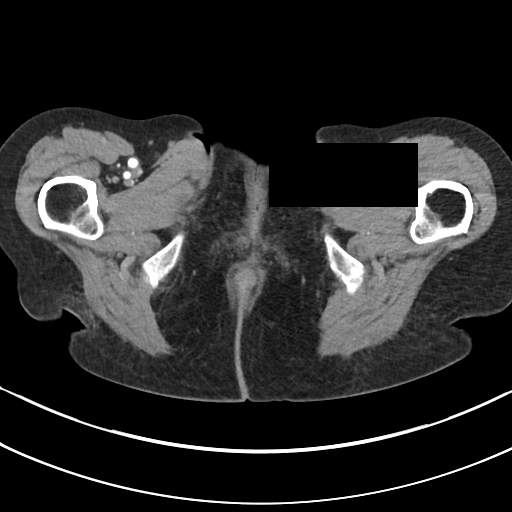
[im 5/83  bone]
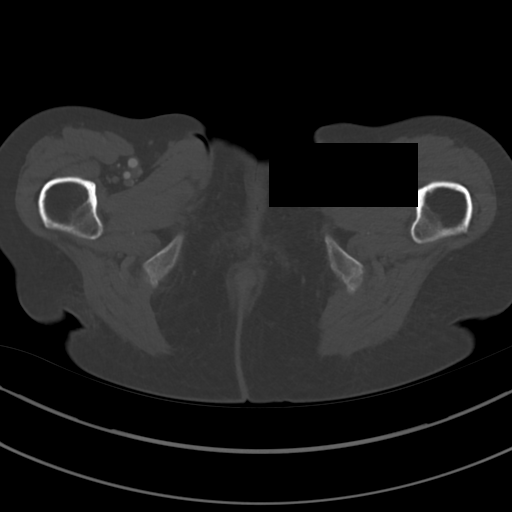
[im 10/83  soft-tissue]
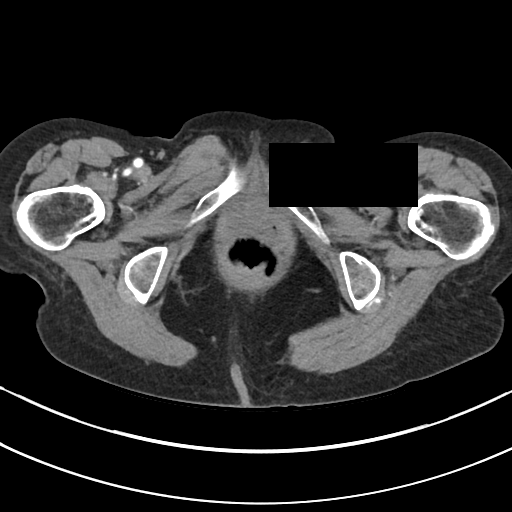
[im 20/83  soft-tissue]
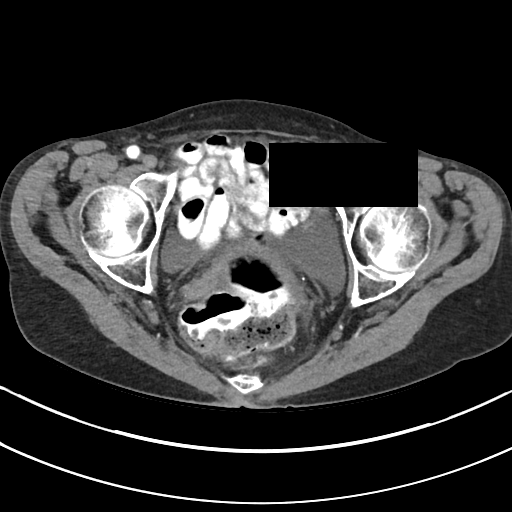
[im 25/83  soft-tissue]
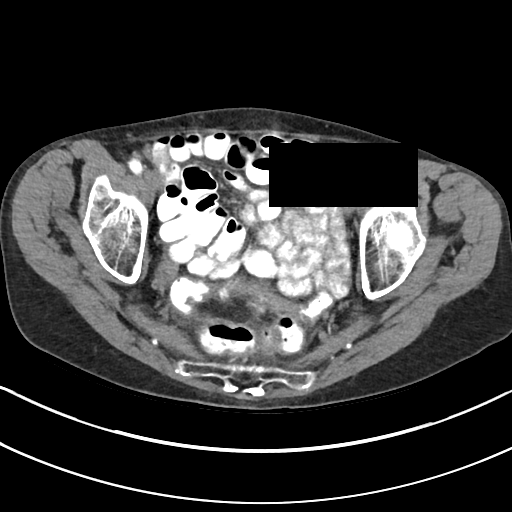
[im 29/83  soft-tissue]
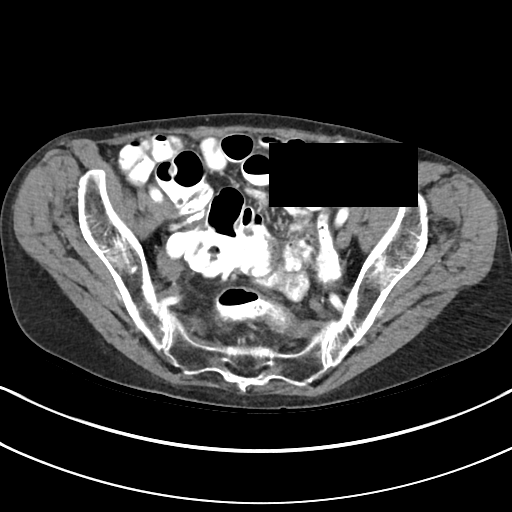
[im 34/83  soft-tissue]
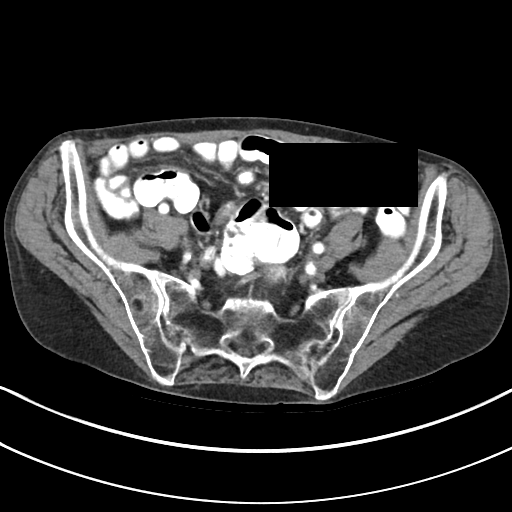
[im 44/83  soft-tissue]
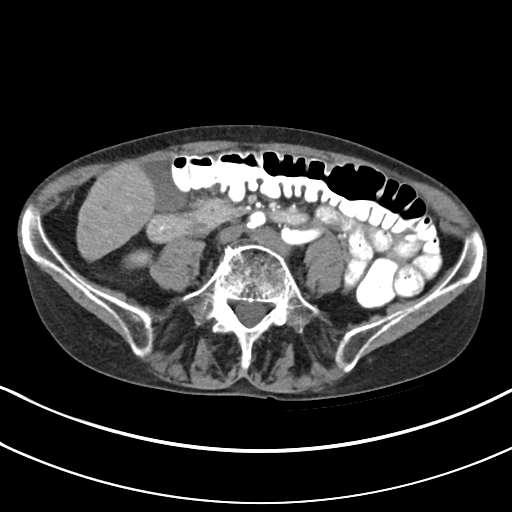
[im 49/83  soft-tissue]
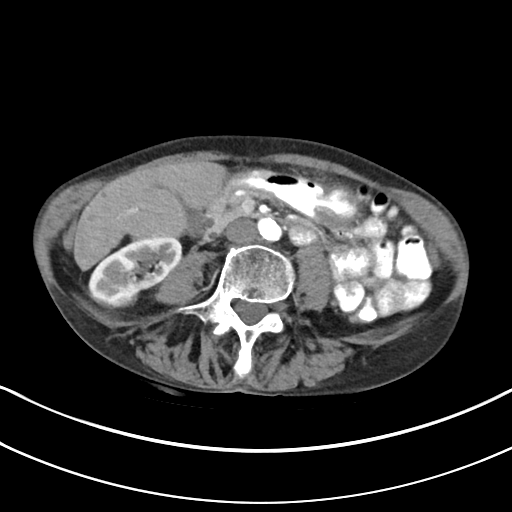
[im 54/83  soft-tissue]
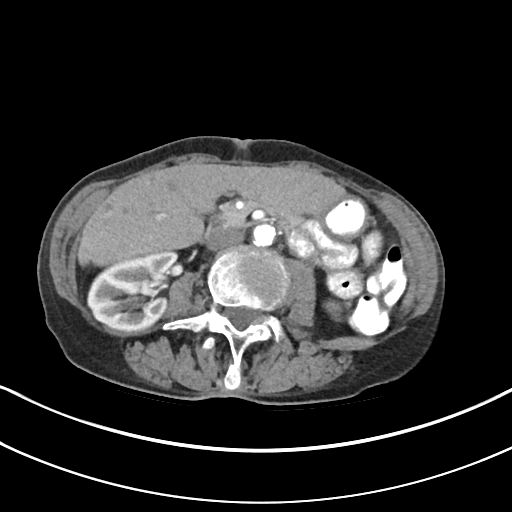
[im 54/83  bone]
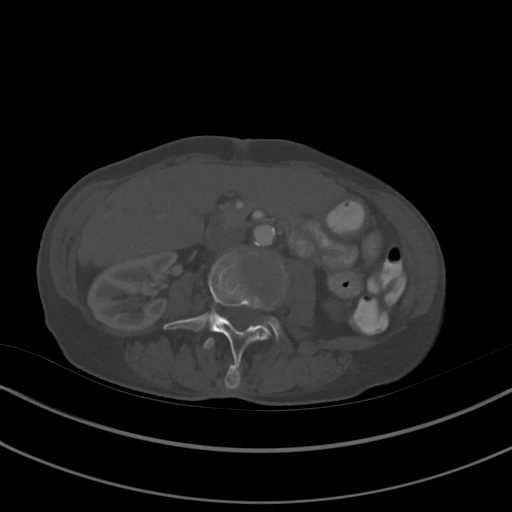
[im 58/83  soft-tissue]
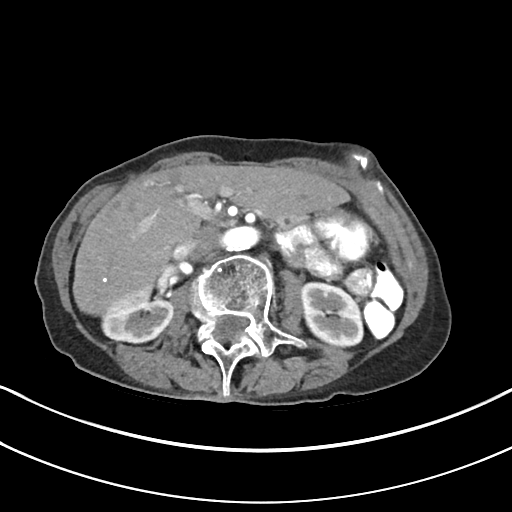
[im 63/83  soft-tissue]
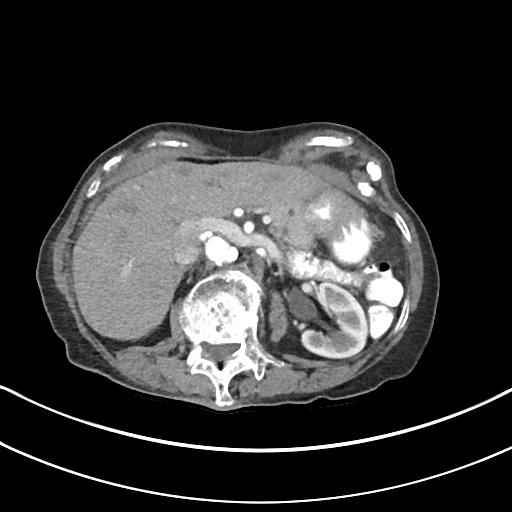
[im 73/83  soft-tissue]
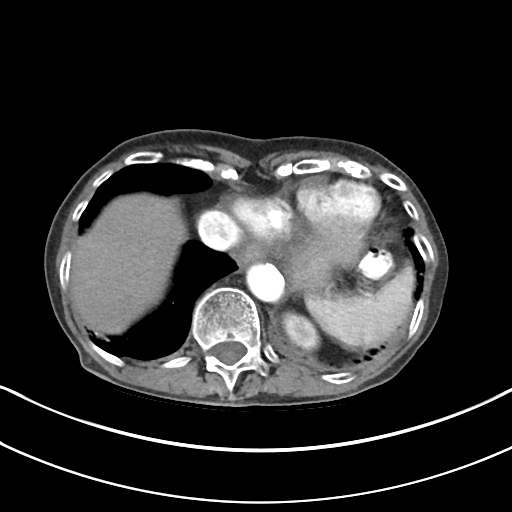
[im 78/83  soft-tissue]
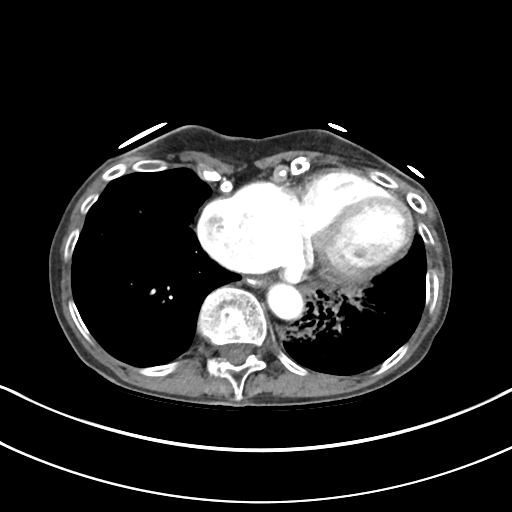

[Series 4: coronals abd pelvis 2.00 cor · coronal · 0.67mm/px · 3 of 109 slices shown]
[im 37/109  soft-tissue]
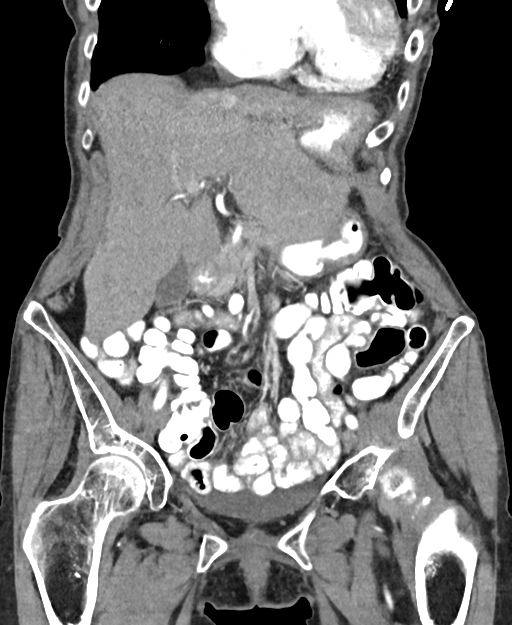
[im 49/109  soft-tissue]
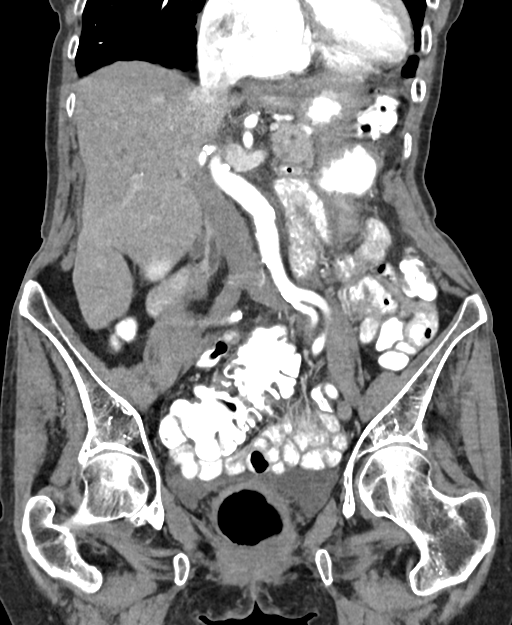
[im 61/109  soft-tissue]
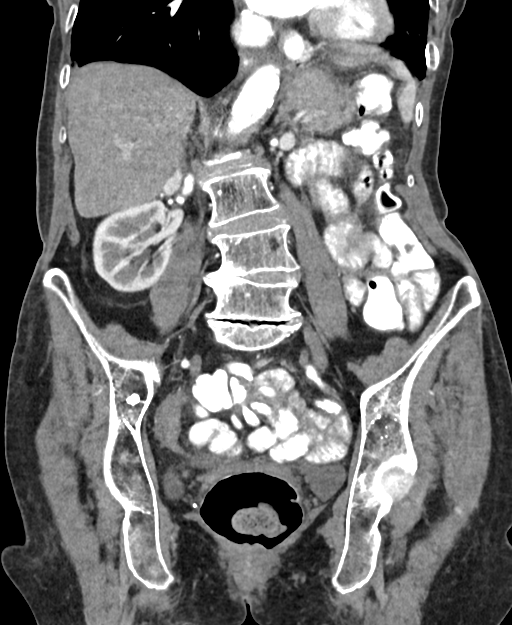

[16 of 46 positions shown; findings below may reference images not displayed]

FINDINGS: Lower chest: Few tiny basilar lung nodules. Calcified granuloma LEFT
lower lobe with volume loss and bronchiectasis in LEFT lower lobe.
Minimal atelectasis/scarring RIGHT lung base. Basilar lung changes
appear stable since the previous exam.

Hepatobiliary: Few tiny hepatic cysts. Liver and gallbladder
otherwise normal appearance.

Pancreas: Atrophic without mass

Spleen: Normal appearance

Adrenals/Urinary Tract: Adrenal glands, kidneys, ureters, and
bladder normal appearance

Stomach/Bowel: Appendix not identified. Cecum located in central
pelvis. Large and small bowel loops normal appearance. Stool noted
in rectum. Gastric wall appears thickened proximally though stomach
is underdistended.

Vascular/Lymphatic: Enlargement of cardiac chambers. Atherosclerotic
calcifications aorta, iliac arteries, origins of visceral arteries.
Scattered pelvic phleboliths. No adenopathy.

Reproductive: Uterus surgically absent. Questionable visualization
of a RIGHT ovary laterally in RIGHT pelvis. LEFT ovary not seen.

Other: No free air or free fluid.  No hernia.

Musculoskeletal: Osseous demineralization. Probable bone island
RIGHT iliac wing. Compression fractures of L1 and L2 with prior
vertebroplasty of L1.
IMPRESSION: Question diffuse wall thickening of proximal stomach versus artifact
from underdistention; recommend correlation with upper endoscopy to
exclude gastritis and gastric neoplasm.

No other definite intra-abdominal or intrapelvic abnormalities.

Chronic LEFT lower lobe volume loss and bronchiectasis as well as
RIGHT basilar scarring and tiny bibasilar nodular foci, unchanged.

Enlargement of cardiac chambers.

Aortic Atherosclerosis ([LO]-[LO]).

## 2021-04-08 MED ORDER — IOHEXOL 350 MG/ML SOLN
75.0000 mL | Freq: Once | INTRAVENOUS | Status: AC | PRN
  Administered 2021-04-08: 75 mL via INTRAVENOUS

## 2021-04-09 ENCOUNTER — Other Ambulatory Visit: Payer: Self-pay

## 2021-04-09 ENCOUNTER — Emergency Department: Payer: Medicare Other

## 2021-04-09 ENCOUNTER — Encounter: Payer: Self-pay | Admitting: Emergency Medicine

## 2021-04-09 ENCOUNTER — Observation Stay
Admission: EM | Admit: 2021-04-09 | Discharge: 2021-04-11 | Disposition: A | Discharge status: Home / Self Care | Payer: Medicare Other | Attending: Family Medicine | Admitting: Family Medicine

## 2021-04-09 DIAGNOSIS — I4811 Longstanding persistent atrial fibrillation: Secondary | ICD-10-CM | POA: Diagnosis not present

## 2021-04-09 DIAGNOSIS — Z79899 Other long term (current) drug therapy: Secondary | ICD-10-CM | POA: Insufficient documentation

## 2021-04-09 DIAGNOSIS — I4819 Other persistent atrial fibrillation: Secondary | ICD-10-CM | POA: Diagnosis present

## 2021-04-09 DIAGNOSIS — I10 Essential (primary) hypertension: Secondary | ICD-10-CM | POA: Diagnosis not present

## 2021-04-09 DIAGNOSIS — E871 Hypo-osmolality and hyponatremia: Secondary | ICD-10-CM | POA: Insufficient documentation

## 2021-04-09 DIAGNOSIS — R040 Epistaxis: Principal | ICD-10-CM | POA: Diagnosis present

## 2021-04-09 DIAGNOSIS — D62 Acute posthemorrhagic anemia: Secondary | ICD-10-CM | POA: Diagnosis not present

## 2021-04-09 DIAGNOSIS — E039 Hypothyroidism, unspecified: Secondary | ICD-10-CM | POA: Diagnosis not present

## 2021-04-09 DIAGNOSIS — Z87891 Personal history of nicotine dependence: Secondary | ICD-10-CM | POA: Insufficient documentation

## 2021-04-09 DIAGNOSIS — I480 Paroxysmal atrial fibrillation: Secondary | ICD-10-CM | POA: Insufficient documentation

## 2021-04-09 DIAGNOSIS — J9600 Acute respiratory failure, unspecified whether with hypoxia or hypercapnia: Secondary | ICD-10-CM

## 2021-04-09 DIAGNOSIS — J9621 Acute and chronic respiratory failure with hypoxia: Secondary | ICD-10-CM | POA: Diagnosis present

## 2021-04-09 DIAGNOSIS — Z20822 Contact with and (suspected) exposure to covid-19: Secondary | ICD-10-CM | POA: Insufficient documentation

## 2021-04-09 DIAGNOSIS — Z7901 Long term (current) use of anticoagulants: Secondary | ICD-10-CM | POA: Insufficient documentation

## 2021-04-09 DIAGNOSIS — E440 Moderate protein-calorie malnutrition: Secondary | ICD-10-CM | POA: Diagnosis present

## 2021-04-09 DIAGNOSIS — R0902 Hypoxemia: Secondary | ICD-10-CM

## 2021-04-09 DIAGNOSIS — I4891 Unspecified atrial fibrillation: Secondary | ICD-10-CM | POA: Diagnosis present

## 2021-04-09 DIAGNOSIS — R935 Abnormal findings on diagnostic imaging of other abdominal regions, including retroperitoneum: Secondary | ICD-10-CM | POA: Diagnosis present

## 2021-04-09 LAB — CBC WITH DIFFERENTIAL/PLATELET
Abs Immature Granulocytes: 0.02 10*3/uL (ref 0.00–0.07)
Basophils Absolute: 0 10*3/uL (ref 0.0–0.1)
Basophils Relative: 0 %
Eosinophils Absolute: 0.1 10*3/uL (ref 0.0–0.5)
Eosinophils Relative: 2 %
HCT: 40.9 % (ref 36.0–46.0)
Hemoglobin: 13.6 g/dL (ref 12.0–15.0)
Immature Granulocytes: 0 %
Lymphocytes Relative: 14 %
Lymphs Abs: 1.1 10*3/uL (ref 0.7–4.0)
MCH: 31.2 pg (ref 26.0–34.0)
MCHC: 33.3 g/dL (ref 30.0–36.0)
MCV: 93.8 fL (ref 80.0–100.0)
Monocytes Absolute: 0.5 10*3/uL (ref 0.1–1.0)
Monocytes Relative: 7 %
Neutro Abs: 5.7 10*3/uL (ref 1.7–7.7)
Neutrophils Relative %: 77 %
Platelets: 237 10*3/uL (ref 150–400)
RBC: 4.36 MIL/uL (ref 3.87–5.11)
RDW: 12.9 % (ref 11.5–15.5)
WBC: 7.4 10*3/uL (ref 4.0–10.5)
nRBC: 0 % (ref 0.0–0.2)

## 2021-04-09 LAB — RESP PANEL BY RT-PCR (FLU A&B, COVID) ARPGX2
Influenza A by PCR: NEGATIVE
Influenza B by PCR: NEGATIVE
SARS Coronavirus 2 by RT PCR: NEGATIVE

## 2021-04-09 LAB — BASIC METABOLIC PANEL
Anion gap: 9 (ref 5–15)
BUN: 14 mg/dL (ref 8–23)
CO2: 32 mmol/L (ref 22–32)
Calcium: 9.3 mg/dL (ref 8.9–10.3)
Chloride: 93 mmol/L — ABNORMAL LOW (ref 98–111)
Creatinine, Ser: 0.65 mg/dL (ref 0.44–1.00)
GFR, Estimated: >60 mL/min (ref >60)
Glucose, Bld: 136 mg/dL — ABNORMAL HIGH (ref 70–99)
Potassium: 4 mmol/L (ref 3.5–5.1)
Sodium: 134 mmol/L — ABNORMAL LOW (ref 135–145)

## 2021-04-09 LAB — PROTIME-INR
INR: 1.2 (ref 0.8–1.2)
Prothrombin Time: 14.9 seconds (ref 11.4–15.2)

## 2021-04-09 IMAGING — DX DG CHEST 1V PORT
1 series · 1 of 1 positions shown · non-contrast
Comparison: Chest CT [DATE] and earlier.

CLINICAL DATA: 85-year-old female with hypoxia.

EXAM:
PORTABLE CHEST 1 VIEW

[chest ap]
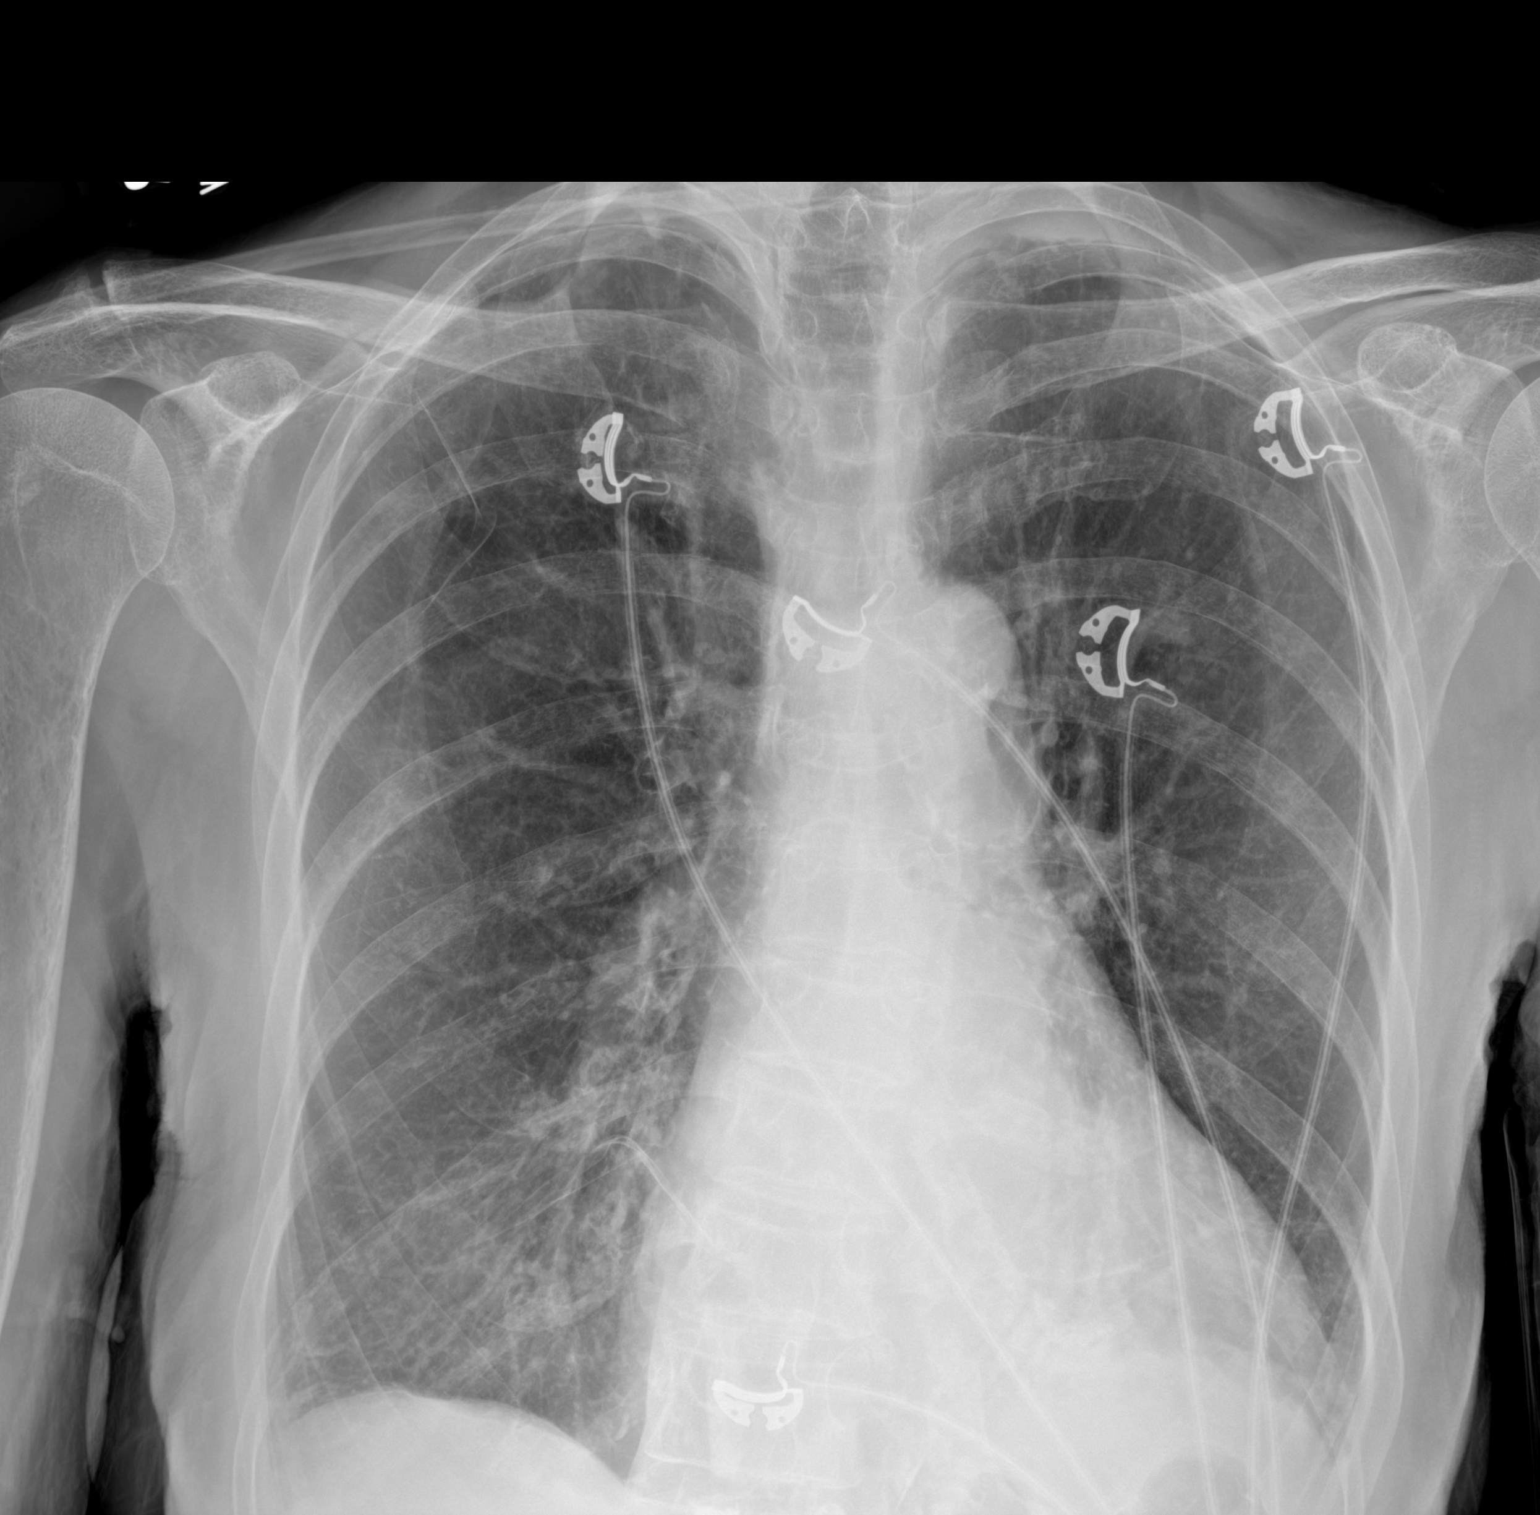

[1 of 1 positions shown; findings below may reference images not displayed]

FINDINGS: Portable AP upright view at [NT] hours. Pulmonary hyperinflation
with bronchiectasis and middle, lower lobe pulmonary atypical
infection demonstrated by CT last year. Stable lung volumes and
mediastinal contours. Mild cardiomegaly. Visualized tracheal air
column is within normal limits. Streaky and nodular left lung base
opacity has not significantly changed. No pneumothorax, pulmonary
edema, pleural effusion or new pulmonary opacity. Osteopenia. Stable
visualized osseous structures.
IMPRESSION: Chronic lung disease and mild cardiomegaly with no new
cardiopulmonary abnormality.

## 2021-04-09 MED ORDER — BIOTIN 5000 MCG PO TABS: 5000.0000 ug | ORAL_TABLET | Freq: Every day | ORAL | Status: DC

## 2021-04-09 MED ORDER — FLORANEX PO PACK
1.0000 g | PACK | Freq: Three times a day (TID) | ORAL | Status: DC
  Administered 2021-04-09 – 2021-04-11 (×5): 1 g via ORAL
  Filled 2021-04-09 (×10): qty 1

## 2021-04-09 MED ORDER — LORAZEPAM 0.5 MG PO TABS: 0.5000 mg | ORAL_TABLET | Freq: Every evening | ORAL | Status: DC | PRN

## 2021-04-09 MED ORDER — ACETAMINOPHEN 650 MG RE SUPP
650.0000 mg | Freq: Four times a day (QID) | RECTAL | Status: DC | PRN
  Filled 2021-04-09: qty 1

## 2021-04-09 MED ORDER — METOPROLOL SUCCINATE ER 25 MG PO TB24
25.0000 mg | ORAL_TABLET | Freq: Every evening | ORAL | Status: DC
  Administered 2021-04-10: 25 mg via ORAL
  Filled 2021-04-09: qty 1

## 2021-04-09 MED ORDER — ONDANSETRON HCL 4 MG PO TABS: 4.0000 mg | ORAL_TABLET | Freq: Four times a day (QID) | ORAL | Status: DC | PRN

## 2021-04-09 MED ORDER — VITAMIN E 45 MG (100 UNIT) PO CAPS
400.0000 [IU] | ORAL_CAPSULE | Freq: Every day | ORAL | Status: DC
  Administered 2021-04-09 – 2021-04-10 (×2): 400 [IU] via ORAL
  Filled 2021-04-09 (×3): qty 4

## 2021-04-09 MED ORDER — CALCIUM CARBONATE 1250 (500 CA) MG PO TABS
1.0000 | ORAL_TABLET | Freq: Every day | ORAL | Status: DC
  Administered 2021-04-10 – 2021-04-11 (×2): 500 mg via ORAL
  Filled 2021-04-09 (×3): qty 1

## 2021-04-09 MED ORDER — METOPROLOL SUCCINATE ER 50 MG PO TB24
50.0000 mg | ORAL_TABLET | Freq: Every morning | ORAL | Status: DC
  Administered 2021-04-09 – 2021-04-11 (×3): 50 mg via ORAL
  Filled 2021-04-09 (×3): qty 1

## 2021-04-09 MED ORDER — CEPHALEXIN 250 MG PO CAPS
250.0000 mg | ORAL_CAPSULE | Freq: Once | ORAL | Status: AC
  Administered 2021-04-09: 250 mg via ORAL
  Filled 2021-04-09: qty 1

## 2021-04-09 MED ORDER — TIZANIDINE HCL 2 MG PO TABS
2.0000 mg | ORAL_TABLET | Freq: Two times a day (BID) | ORAL | Status: DC | PRN
  Filled 2021-04-09: qty 1

## 2021-04-09 MED ORDER — HYDROCODONE-ACETAMINOPHEN 5-325 MG PO TABS: 1.0000 | ORAL_TABLET | Freq: Four times a day (QID) | ORAL | Status: DC | PRN

## 2021-04-09 MED ORDER — ONDANSETRON HCL 4 MG/2ML IJ SOLN
4.0000 mg | Freq: Once | INTRAMUSCULAR | Status: AC
  Administered 2021-04-09: 4 mg via INTRAVENOUS
  Filled 2021-04-09: qty 2

## 2021-04-09 MED ORDER — SODIUM CHLORIDE 0.9 % IV SOLN: INTRAVENOUS | Status: AC

## 2021-04-09 MED ORDER — ACETAMINOPHEN 325 MG PO TABS
650.0000 mg | ORAL_TABLET | Freq: Four times a day (QID) | ORAL | Status: DC | PRN
  Administered 2021-04-09 – 2021-04-11 (×3): 650 mg via ORAL
  Filled 2021-04-09 (×3): qty 2

## 2021-04-09 MED ORDER — OXYMETAZOLINE HCL 0.05 % NA SOLN
1.0000 | Freq: Once | NASAL | Status: AC
  Administered 2021-04-09: 1 via NASAL

## 2021-04-09 MED ORDER — LEVOTHYROXINE SODIUM 50 MCG PO TABS: 50.0000 ug | ORAL_TABLET | Freq: Every day | ORAL | Status: DC

## 2021-04-09 MED ORDER — ONDANSETRON HCL 4 MG/2ML IJ SOLN: 4.0000 mg | Freq: Four times a day (QID) | INTRAMUSCULAR | Status: DC | PRN

## 2021-04-09 MED ORDER — ADULT MULTIVITAMIN W/MINERALS CH
1.0000 | ORAL_TABLET | Freq: Every day | ORAL | Status: DC
  Administered 2021-04-09 – 2021-04-10 (×2): 1 via ORAL
  Filled 2021-04-09 (×2): qty 1

## 2021-04-09 MED ORDER — CALCIUM CARB-CHOLECALCIFEROL 600-800 MG-UNIT PO TABS: ORAL_TABLET | Freq: Every day | ORAL | Status: DC

## 2021-04-09 MED ORDER — CEPHALEXIN 500 MG PO CAPS
500.0000 mg | ORAL_CAPSULE | Freq: Two times a day (BID) | ORAL | Status: DC
  Administered 2021-04-09 – 2021-04-11 (×5): 500 mg via ORAL
  Filled 2021-04-09 (×5): qty 1

## 2021-04-09 MED ORDER — VITAMIN D 25 MCG (1000 UNIT) PO TABS
1000.0000 [IU] | ORAL_TABLET | Freq: Every day | ORAL | Status: DC
  Administered 2021-04-10 – 2021-04-11 (×2): 1000 [IU] via ORAL
  Filled 2021-04-09 (×2): qty 1

## 2021-04-09 MED ORDER — CEPHALEXIN 500 MG PO CAPS: 500.0000 mg | ORAL_CAPSULE | Freq: Two times a day (BID) | ORAL | Status: DC

## 2021-04-09 NOTE — ED Notes (Signed)
Pt assisted to bedside commode with minimal assist

## 2021-04-09 NOTE — ED Triage Notes (Signed)
[facility] - pt comes from home with a nosebleed. Pt on xarelto. Still currently bleeding at time of arrival. Pt hypertensive.

## 2021-04-09 NOTE — ED Notes (Signed)
Ariel RN aware of assigned bed 

## 2021-04-09 NOTE — ED Notes (Signed)
Pt taken off nonrebreather d/t n/v

## 2021-04-09 NOTE — H&P (Addendum)
History and Physical    Sutton Plake Honea WUJ:[id number] DOB: 1934-12-01 DOA: 04/09/2021  PCP: Lynnea Ferrier, MD   Patient coming from: Home  I have personally briefly reviewed patient's old medical records in [facility]  Chief Complaint: Nosebleed  HPI: Kaylee Santiago is a 86 y.o. female with medical history significant for atrial fibrillation on chronic anticoagulation therapy, hypertension, hypothyroidism who presents to the emergency room for evaluation of nosebleed from the right nare. Patient states that she woke up this morning and had gone to use the bathroom.  While gargling her mouth she noticed large blood clots and then noticed bleeding from her right nare.  She called EMS who administered Afrin sprays without any improvement.  When she arrived to ER she had nasal packing done by the emergency room physician with transient improvement but then she started bleeding through the packing.  Patient was noted to be hypertensive when she arrived. She denies having any prior episodes, denies having a headache, denies feeling dizzy or lightheaded. She denies having any chest pain, no shortness of breath, no nausea, no vomiting, no abdominal pain, no changes in her bowel habits, no urinary symptoms, no focal deficits or blurred vision. Labs show sodium 134, potassium 4.0, chloride 93, bicarb 32, glucose 136, BUN 14, creatinine 0.65, calcium 9.3, white count 7.4, hemoglobin 13.6, hematocrit 40.9, MCV 93.8, RDW 12.9, platelet count 237, PT 14.9, INR 1.2 Respiratory viral panel is negative Chest x-ray reviewed by me shows hyperinflated lung fields with mild cardiomegaly. Twelve-lead EKG reviewed by me shows atrial fibrillation with left axis deviation.   ED Course: Patient is an 86 year old female with a history of atrial fibrillation on chronic anticoagulation therapy with Xarelto who presents to the ER for evaluation of epistaxis to her right nare. The bleeding was stopped in the ER after  nasal packing but patient became hypoxic with room air pulse oximetry between 82 and 85% and so was placed on a nonrebreather mask. She will be referred to observation status for further evaluation.   Review of Systems: As per HPI otherwise all other systems reviewed and negative.    Past Medical History:  Diagnosis Date   Atrial fibrillation (HCC)    Atrial fibrillation (HCC)    Back pain    Dysrhythmia    Hypertension    Hypothyroidism     Past Surgical History:  Procedure Laterality Date   ABDOMINAL HYSTERECTOMY     BREAST EXCISIONAL BIOPSY Right 70's   CATARACT EXTRACTION W/ INTRAOCULAR LENS  IMPLANT, BILATERAL     KYPHOPLASTY N/A 12/21/2018   Procedure: KYPHOPLASTY L1;  Surgeon: Kennedy Bucker, MD;  Location: [facility] ORS;  Service: Orthopedics;  Laterality: N/A;     reports that she has quit smoking. Her smoking use included cigarettes. She has never used smokeless tobacco. She reports that she does not currently use alcohol. She reports that she does not use drugs.  Allergies  Allergen Reactions   Amoxicillin-Pot Clavulanate Diarrhea   Levofloxacin Diarrhea    Family History  Problem Relation Age of Onset   Breast cancer Sister 46   Breast cancer Sister        22's   CAD Mother       Prior to Admission medications   Medication Sig Start Date End Date Taking? Authorizing Provider  Biotin 5000 MCG TABS Take 5,000 mcg by mouth daily.    [provider]  Calcium Carb-Cholecalciferol (CALTRATE 600+D3 PO) Take 1 tablet by mouth daily.  [provider]  HYDROcodone-acetaminophen (NORCO/VICODIN) 5-325 MG tablet Take 1 tablet by mouth every 6 (six) hours as needed for moderate pain.    [provider]  levothyroxine (SYNTHROID, LEVOTHROID) 50 MCG tablet Take 50 mcg by mouth daily before breakfast.  07/25/17   [provider]  LORazepam (ATIVAN) 0.5 MG tablet Take 0.5 mg by mouth at bedtime as needed for sleep.    [provider]   metoprolol succinate (TOPROL-XL) 50 MG 24 hr tablet Take 25-50 mg by mouth See admin instructions. Take 50 mg in the morning and 25 mg in the evening 12/26/17   [provider]  Multiple Vitamin (MULTI-VITAMIN DAILY PO) Take 1 tablet by mouth daily.    [provider]  rivaroxaban (XARELTO) 20 MG TABS tablet Take 20 mg by mouth daily. 07/25/17   [provider]  tiZANidine (ZANAFLEX) 2 MG tablet Take 2 mg by mouth 2 (two) times daily as needed for muscle spasms.  12/11/18   [provider]  vitamin E 400 UNIT capsule Take 400 Units by mouth daily.    [provider]    Physical Exam: Vitals:   04/09/21 0700 04/09/21 0734 04/09/21 0754 04/09/21 0800  BP: (!) 140/92 (!) 170/72  (!) 145/81  Pulse: 80 77  65  Resp: 17 20 (!) 22 17  Temp:      SpO2: 99% 100% 97% 94%  Weight:      Height:         Vitals:   04/09/21 0700 04/09/21 0734 04/09/21 0754 04/09/21 0800  BP: (!) 140/92 (!) 170/72  (!) 145/81  Pulse: 80 77  65  Resp: 17 20 (!) 22 17  Temp:      SpO2: 99% 100% 97% 94%  Weight:      Height:          Constitutional: Alert and oriented x 3 . Not in any apparent distress. Thin and Frail HEENT:      Head: Normocephalic and atraumatic.         Eyes: PERLA, EOMI, Conjunctivae are normal. Sclera is non-icteric.       Mouth/Throat: Mucous membranes are moist.       Neck: Supple with no signs of meningismus. Cardiovascular: Irregularly irregular. No murmurs, gallops, or rubs. 2+ symmetrical distal pulses are present . No JVD. No LE edema Respiratory: Respiratory effort normal .Lungs sounds clear bilaterally. No wheezes, crackles, or rhonchi.  Gastrointestinal: Soft, non tender, and non distended with positive bowel sounds.  Genitourinary: No CVA tenderness. Musculoskeletal: Nontender with normal range of motion in all extremities. No cyanosis, or erythema of extremities. Neurologic:  Face is symmetric. Moving all extremities. No gross focal  neurologic deficits . Skin: Skin is warm, dry.  No rash or ulcers Psychiatric: Mood and affect are normal    Labs on Admission: I have personally reviewed following labs and imaging studies  CBC: Recent Labs  Lab 04/09/21 0511  WBC 7.4  NEUTROABS 5.7  HGB 13.6  HCT 40.9  MCV 93.8  PLT 237   Basic Metabolic Panel: Recent Labs  Lab 04/09/21 0511  NA 134*  K 4.0  CL 93*  CO2 32  GLUCOSE 136*  BUN 14  CREATININE 0.65  CALCIUM 9.3   GFR: Estimated Creatinine Clearance: 45 mL/min (by C-G formula based on SCr of 0.65 mg/dL). Liver Function Tests: No results for input(s): AST, ALT, ALKPHOS, BILITOT, PROT, ALBUMIN in the last 168 hours. No results for input(s): LIPASE, AMYLASE  in the last 168 hours. No results for input(s): AMMONIA in the last 168 hours. Coagulation Profile: Recent Labs  Lab 04/09/21 0511  INR 1.2   Cardiac Enzymes: No results for input(s): CKTOTAL, CKMB, CKMBINDEX, TROPONINI in the last 168 hours. BNP (last 3 results) No results for input(s): PROBNP in the last 8760 hours. HbA1C: No results for input(s): HGBA1C in the last 72 hours. CBG: No results for input(s): GLUCAP in the last 168 hours. Lipid Profile: No results for input(s): CHOL, HDL, LDLCALC, TRIG, CHOLHDL, LDLDIRECT in the last 72 hours. Thyroid Function Tests: No results for input(s): TSH, T4TOTAL, FREET4, T3FREE, THYROIDAB in the last 72 hours. Anemia Panel: No results for input(s): VITAMINB12, FOLATE, FERRITIN, TIBC, IRON, RETICCTPCT in the last 72 hours. Urine analysis:    Component Value Date/Time   COLORURINE STRAW (A) 01/01/2019 1510   APPEARANCEUR CLEAR (A) 01/01/2019 1510   LABSPEC 1.005 01/01/2019 1510   PHURINE 7.0 01/01/2019 1510   GLUCOSEU NEGATIVE 01/01/2019 1510   HGBUR SMALL (A) 01/01/2019 1510   BILIRUBINUR NEGATIVE 01/01/2019 1510   KETONESUR NEGATIVE 01/01/2019 1510   PROTEINUR NEGATIVE 01/01/2019 1510   NITRITE NEGATIVE 01/01/2019 1510   LEUKOCYTESUR NEGATIVE  01/01/2019 1510    Radiological Exams on Admission: CT ABDOMEN PELVIS W CONTRAST  Result Date: 04/08/2021 CLINICAL DATA:  Intermittent diarrhea mid lower abdominal pain for 1 month. History atrial fibrillation, hypertension, former smoker EXAM: CT ABDOMEN AND PELVIS WITH CONTRAST TECHNIQUE: Multidetector CT imaging of the abdomen and pelvis was performed using the standard protocol following bolus administration of intravenous contrast. Sagittal and coronal MPR images reconstructed from axial data set. CONTRAST:  17mL OMNIPAQUE IOHEXOL 350 MG/ML SOLN IV. Dilute oral contrast. COMPARISON:  01/01/2019 FINDINGS: Lower chest: Few tiny basilar lung nodules. Calcified granuloma LEFT lower lobe with volume loss and bronchiectasis in LEFT lower lobe. Minimal atelectasis/scarring RIGHT lung base. Basilar lung changes appear stable since the previous exam. Hepatobiliary: Few tiny hepatic cysts. Liver and gallbladder otherwise normal appearance. Pancreas: Atrophic without mass Spleen: Normal appearance Adrenals/Urinary Tract: Adrenal glands, kidneys, ureters, and bladder normal appearance Stomach/Bowel: Appendix not identified. Cecum located in central pelvis. Large and small bowel loops normal appearance. Stool noted in rectum. Gastric wall appears thickened proximally though stomach is underdistended. Vascular/Lymphatic: Enlargement of cardiac chambers. Atherosclerotic calcifications aorta, iliac arteries, origins of visceral arteries. Scattered pelvic phleboliths. No adenopathy. Reproductive: Uterus surgically absent. Questionable visualization of a RIGHT ovary laterally in RIGHT pelvis. LEFT ovary not seen. Other: No free air or free fluid.  No hernia. Musculoskeletal: Osseous demineralization. Probable bone island RIGHT iliac wing. Compression fractures of L1 and L2 with prior vertebroplasty of L1. IMPRESSION: Question diffuse wall thickening of proximal stomach versus artifact from underdistention; recommend  correlation with upper endoscopy to exclude gastritis and gastric neoplasm. No other definite intra-abdominal or intrapelvic abnormalities. Chronic LEFT lower lobe volume loss and bronchiectasis as well as RIGHT basilar scarring and tiny bibasilar nodular foci, unchanged. Enlargement of cardiac chambers. Aortic Atherosclerosis (ICD10-I70.0). Electronically Signed   By: Ulyses Southward M.D.   On: 04/08/2021 15:39   DG Chest Port 1 View  Result Date: 04/09/2021 CLINICAL DATA:  86 year old female with hypoxia. EXAM: PORTABLE CHEST 1 VIEW COMPARISON:  Chest CT 03/26/2020 and earlier. FINDINGS: Portable AP upright view at 0619 hours. Pulmonary hyperinflation with bronchiectasis and middle, lower lobe pulmonary atypical infection demonstrated by CT last year. Stable lung volumes and mediastinal contours. Mild cardiomegaly. Visualized tracheal air column is within normal limits. Streaky and nodular  left lung base opacity has not significantly changed. No pneumothorax, pulmonary edema, pleural effusion or new pulmonary opacity. Osteopenia. Stable visualized osseous structures. IMPRESSION: Chronic lung disease and mild cardiomegaly with no new cardiopulmonary abnormality. Electronically Signed   By: Odessa Fleming M.D.   On: 04/09/2021 06:32     Assessment/Plan Principal Problem:   Acute respiratory failure (HCC) Active Problems:   Hypoxemia   Epistaxis   Atrial fibrillation (HCC)   Hypothyroidism   Hypertension     Patient is an 86 year old female who presents to the ER for evaluation of epistaxis.    Acute respiratory failure Patient has a history of COPD and presents to the ER for evaluation of epistaxis involving the right nare. After the right nare was packed patient became hypoxic with pulse oximetry between 82 to 85% She is currently on a Ventimask maintain pulse oximetry of about 93% Will attempt to wean patient off oxygen as tolerated     Epistaxis Unclear etiology Hold Xarelto We will  request ENT consult We will start patient empirically on Keflex per ENT recommendation     History of atrial fibrillation Continue metoprolol for rate control Hold Xarelto due to active bleeding     Hypothyroidism Continue Synthroid    Hypertension Continue metoprolol    DVT prophylaxis: SCD  Code Status: full code  Family Communication: Greater than 50% of time was spent discussing patient's condition and plan of care with her at the bedside.  All questions and concerns have been addressed.  She verbalizes understanding and agrees with the plan. Disposition Plan: Back to previous home environment Consults called: ENT Status: Observation    Dishon Kehoe MD [facility]     04/09/2021, 9:26 AM

## 2021-04-09 NOTE — Consult Note (Signed)
..Toshiye, Kever [id number] Aug 20, 1934 Lucile Shutters, MD  Reason for Consult: epistaxies  HPI: 86 y.o. female presented to ER via EMS due to severe acute epistaxis.  Patient has history of Afib and is on Xarelto but denies frequent bleeding episodes like this.  She was packed in ER with 10cm Merocel but after packing her oxygen saturations decreased and she needed to be admitted.  She has had some bleeding since the pack was placed and I was asked to evaluate patient.  Allergies:  Allergies  Allergen Reactions   Amoxicillin-Pot Clavulanate Diarrhea   Levofloxacin Diarrhea    ROS: Review of systems normal other than 12 systems except per HPI.  PMH:  Past Medical History:  Diagnosis Date   Atrial fibrillation (HCC)    Atrial fibrillation (HCC)    Back pain    Dysrhythmia    Hypertension    Hypothyroidism     FH:  Family History  Problem Relation Age of Onset   Breast cancer Sister 35   Breast cancer Sister        25's   CAD Mother     SH:  Social History   Socioeconomic History   Marital status: Widowed    Spouse name: Not on file   Number of children: Not on file   Years of education: Not on file   Highest education level: Not on file  Occupational History   Not on file  Tobacco Use   Smoking status: Former    Types: Cigarettes   Smokeless tobacco: Never  Vaping Use   Vaping Use: Never used  Substance and Sexual Activity   Alcohol use: Not Currently   Drug use: Never   Sexual activity: Not on file  Other Topics Concern   Not on file  Social History Narrative   Lives at home independently.  Ambulates steadily.   Social Determinants of Health   Financial Resource Strain: Not on file  Food Insecurity: Not on file  Transportation Needs: Not on file  Physical Activity: Not on file  Stress: Not on file  Social Connections: Not on file  Intimate Partner Violence: Not on file    PSH:  Past Surgical History:  Procedure Laterality Date   ABDOMINAL  HYSTERECTOMY     BREAST EXCISIONAL BIOPSY Right 70's   CATARACT EXTRACTION W/ INTRAOCULAR LENS  IMPLANT, BILATERAL     KYPHOPLASTY N/A 12/21/2018   Procedure: KYPHOPLASTY L1;  Surgeon: Kennedy Bucker, MD;  Location: [facility] ORS;  Service: Orthopedics;  Laterality: N/A;    Physical  Exam:  GEN-  NAD, sitting upright in bed NEURO- CN 2-12 grossly intact and symmetric. EARS- external ears clear NOSE- right nasal packing with merocel sponge soaked with blood.  No active bleeding on right.  Milde mass effect on left nares with no active bleeding OC/OP- sequela of previous bleeding with strands of dark blood but no active bleeding NECK- supple with no LAD RESP- unlabored CARD-  irregular with tachycardia   A/P: Epistaxis with some intermittent bleeding after packing.  Plan:  Discussed findings with patient and family.  No active bleeding and 10cm merocel well seated and in place.  I am hesitant to remove given amount of bleeding previously.  Discussed with patient that we may temporarily have to deal with some drainage at times for a day or two.  Urged use of Afrin and nasal clamp if it bleeds signfiicantly.  If bleeding persists despite bed rest, can remove packing and see what is there but  if true posterior bleed would then potentially need to go to OR for control of nasal hemorrhage.  Given current stability, recommend patient just stay in bed her for a day a two and will continue to follow.  Urged continuing packing for 5 days and cover with antibiotic.  Patient has history of c diff but reports she tolerates cephalosporins ok in past.  Urged patient to continue pro-biotic.   Roney Mans Cledith Abdou 04/09/2021 11:25 AM

## 2021-04-09 NOTE — Progress Notes (Signed)
Pt was initially assigned to room 209, she disappeared  from the unit board. Per ED nurse, Stephens November, no one took her off their board. Pt arrived to room via wheelchair, she is A&O x4, still bleeding from her nose, packing in place. We are currently waiting on bed reassignment from bed placement. Report given to Weston, Charity fundraiser.

## 2021-04-09 NOTE — ED Notes (Signed)
Nose clamp applied

## 2021-04-09 NOTE — ED Provider Notes (Signed)
[facility] Emergency Department Provider Note   ____________________________________________   Event Date/Time   First MD Initiated Contact with Patient 04/09/21 [phone]     (approximate)  I have reviewed the triage vital signs and the nursing notes.   HISTORY  Chief Complaint Epistaxis    HPI Kaylee Santiago is a 86 y.o. female brought to the ED via EMS from home with a chief complaint of nosebleed.  Patient denies frequent nosebleeds; states she got up to use the restroom and her right nostril was bleeding.  Patient is on Xarelto for atrial fibrillation.  Received Afrin sprays by EMS prior to arrival.  Hypertensive on arrival.  Denies recent fever, cough, chest pain, shortness of breath, abdominal pain, vomiting or dizziness.  Endorses nausea.  Denies recent trauma.  Had CT abdomen/pelvis with IV contrast yesterday.     Past Medical History:  Diagnosis Date   Atrial fibrillation [facility])    Atrial fibrillation (HCC)    Back pain    Dysrhythmia    Hypertension    Hypothyroidism     Patient Active Problem List   Diagnosis Date Noted   Pneumonia 06/29/2018    Past Surgical History:  Procedure Laterality Date   ABDOMINAL HYSTERECTOMY     BREAST EXCISIONAL BIOPSY Right 70's   CATARACT EXTRACTION W/ INTRAOCULAR LENS  IMPLANT, BILATERAL     KYPHOPLASTY N/A 12/21/2018   Procedure: KYPHOPLASTY L1;  Surgeon: Kennedy Bucker, MD;  Location: [facility] ORS;  Service: Orthopedics;  Laterality: N/A;    Prior to Admission medications   Medication Sig Start Date End Date Taking? Authorizing Provider  Biotin 5000 MCG TABS Take 5,000 mcg by mouth daily.    [provider]  Calcium Carb-Cholecalciferol (CALTRATE 600+D3 PO) Take 1 tablet by mouth daily.    [provider]  HYDROcodone-acetaminophen (NORCO/VICODIN) 5-325 MG tablet Take 1 tablet by mouth every 6 (six) hours as needed for moderate pain.    [provider]  levothyroxine (SYNTHROID,  LEVOTHROID) 50 MCG tablet Take 50 mcg by mouth daily before breakfast.  07/25/17   [provider]  LORazepam (ATIVAN) 0.5 MG tablet Take 0.5 mg by mouth at bedtime as needed for sleep.    [provider]  metoprolol succinate (TOPROL-XL) 50 MG 24 hr tablet Take 25-50 mg by mouth See admin instructions. Take 50 mg in the morning and 25 mg in the evening 12/26/17   [provider]  Multiple Vitamin (MULTI-VITAMIN DAILY PO) Take 1 tablet by mouth daily.    [provider]  rivaroxaban (XARELTO) 20 MG TABS tablet Take 20 mg by mouth daily. 07/25/17   [provider]  tiZANidine (ZANAFLEX) 2 MG tablet Take 2 mg by mouth 2 (two) times daily as needed for muscle spasms.  12/11/18   [provider]  vitamin E 400 UNIT capsule Take 400 Units by mouth daily.    [provider]    Allergies Amoxicillin-pot clavulanate and Levofloxacin  Family History  Problem Relation Age of Onset   Breast cancer Sister 7   Breast cancer Sister        70's   CAD Mother     Social History Social History   Tobacco Use   Smoking status: Former    Types: Cigarettes   Smokeless tobacco: Never  Vaping Use   Vaping Use: Never used  Substance Use Topics   Alcohol use: Not Currently   Drug use: Never    Review of Systems  Constitutional: No fever/chills Eyes: No visual changes. ENT: Positive for nosebleed.  No sore throat. Cardiovascular: Denies chest pain. Respiratory: Denies shortness of breath. Gastrointestinal: No abdominal pain.  Positive for nausea, no vomiting.  No diarrhea.  No constipation. Genitourinary: Negative for dysuria. Musculoskeletal: Negative for back pain. Skin: Negative for rash. Neurological: Negative for headaches, focal weakness or numbness.   ____________________________________________   PHYSICAL EXAM:  VITAL SIGNS: ED Triage Vitals  Enc Vitals Group     BP --      Pulse --      Resp --      Temp --      Temp  src --      SpO2 --      Weight 04/09/21 0502 117 lb 15.1 oz (53.5 kg)     Height 04/09/21 0502 5\' 11"  (1.803 m)     Head Circumference --      Peak Flow --      Pain Score 04/09/21 0501 0     Pain Loc --      Pain Edu? --      Excl. in GC? --     Constitutional: Alert and oriented.  Elderly appearing and in moderate acute distress. Eyes: Conjunctivae are normal. PERRL. EOMI. Head: Atraumatic. Nose: Brisk bleeding from right nare. Mouth/Throat: Mucous membranes are moist.  Coughing up large blood clots.  Neck: No stridor.   Cardiovascular: Normal rate, regular rhythm. Grossly normal heart sounds.  Good peripheral circulation. Respiratory: Normal respiratory effort.  No retractions. Lungs CTAB. Gastrointestinal: Soft and nontender. No distention. No abdominal bruits. No CVA tenderness. Musculoskeletal: No lower extremity tenderness nor edema.  No joint effusions. Neurologic:  Normal speech and language. No gross focal neurologic deficits are appreciated.  Skin:  Skin is warm, dry and intact. No rash noted. Psychiatric: Mood and affect are normal. Speech and behavior are normal.  ____________________________________________   LABS (all labs ordered are listed, but only abnormal results are displayed)  Labs Reviewed  BASIC METABOLIC PANEL - Abnormal; Notable for the following components:      Result Value   Sodium 134 (*)    Chloride 93 (*)    Glucose, Bld 136 (*)    All other components within normal limits  RESP PANEL BY RT-PCR (FLU A&B, COVID) [id number]  CBC WITH DIFFERENTIAL/PLATELET  PROTIME-INR   ____________________________________________  EKG  ED ECG REPORT I, Tymeer Vaquera J, the attending physician, personally viewed and interpreted this ECG.   Date: 04/09/2021  EKG Time: 0521  Rate: 84  Rhythm: normal sinus rhythm  Axis: Normal  Intervals:none  ST&T Change: Nonspecific  ____________________________________________  RADIOLOGY I, Sadiq Mccauley J, personally  viewed and evaluated these images (plain radiographs) as part of my medical decision making, as well as reviewing the written report by the radiologist.  ED MD interpretation: Chest x-ray demonstrates no acute cardiopulmonary process; noted CT abdomen/pelvis from 04/08/2021  Official radiology report(s): CT ABDOMEN PELVIS W CONTRAST  Result Date: 04/08/2021 CLINICAL DATA:  Intermittent diarrhea mid lower abdominal pain for 1 month. History atrial fibrillation, hypertension, former smoker EXAM: CT ABDOMEN AND PELVIS WITH CONTRAST TECHNIQUE: Multidetector CT imaging of the abdomen and pelvis was performed using the standard protocol following bolus administration of intravenous contrast. Sagittal and coronal MPR images reconstructed from axial data set. CONTRAST:  50mL OMNIPAQUE IOHEXOL 350 MG/ML SOLN IV. Dilute oral contrast. COMPARISON:  01/01/2019 FINDINGS: Lower chest: Few tiny basilar lung nodules. Calcified granuloma LEFT lower lobe with volume loss and bronchiectasis in  LEFT lower lobe. Minimal atelectasis/scarring RIGHT lung base. Basilar lung changes appear stable since the previous exam. Hepatobiliary: Few tiny hepatic cysts. Liver and gallbladder otherwise normal appearance. Pancreas: Atrophic without mass Spleen: Normal appearance Adrenals/Urinary Tract: Adrenal glands, kidneys, ureters, and bladder normal appearance Stomach/Bowel: Appendix not identified. Cecum located in central pelvis. Large and small bowel loops normal appearance. Stool noted in rectum. Gastric wall appears thickened proximally though stomach is underdistended. Vascular/Lymphatic: Enlargement of cardiac chambers. Atherosclerotic calcifications aorta, iliac arteries, origins of visceral arteries. Scattered pelvic phleboliths. No adenopathy. Reproductive: Uterus surgically absent. Questionable visualization of a RIGHT ovary laterally in RIGHT pelvis. LEFT ovary not seen. Other: No free air or free fluid.  No hernia.  Musculoskeletal: Osseous demineralization. Probable bone island RIGHT iliac wing. Compression fractures of L1 and L2 with prior vertebroplasty of L1. IMPRESSION: Question diffuse wall thickening of proximal stomach versus artifact from underdistention; recommend correlation with upper endoscopy to exclude gastritis and gastric neoplasm. No other definite intra-abdominal or intrapelvic abnormalities. Chronic LEFT lower lobe volume loss and bronchiectasis as well as RIGHT basilar scarring and tiny bibasilar nodular foci, unchanged. Enlargement of cardiac chambers. Aortic Atherosclerosis (ICD10-I70.0). Electronically Signed   By: Ulyses Southward M.D.   On: 04/08/2021 15:39   DG Chest Port 1 View  Result Date: 04/09/2021 CLINICAL DATA:  86 year old female with hypoxia. EXAM: PORTABLE CHEST 1 VIEW COMPARISON:  Chest CT 03/26/2020 and earlier. FINDINGS: Portable AP upright view at 0619 hours. Pulmonary hyperinflation with bronchiectasis and middle, lower lobe pulmonary atypical infection demonstrated by CT last year. Stable lung volumes and mediastinal contours. Mild cardiomegaly. Visualized tracheal air column is within normal limits. Streaky and nodular left lung base opacity has not significantly changed. No pneumothorax, pulmonary edema, pleural effusion or new pulmonary opacity. Osteopenia. Stable visualized osseous structures. IMPRESSION: Chronic lung disease and mild cardiomegaly with no new cardiopulmonary abnormality. Electronically Signed   By: Odessa Fleming M.D.   On: 04/09/2021 06:32    ____________________________________________   PROCEDURES  Procedure(s) performed (including Critical Care):  .1-3 Lead EKG Interpretation Performed by: Irean Hong, MD Authorized by: Irean Hong, MD   .Epistaxis Management  Date/Time: 04/09/2021 5:15 AM Performed by: Irean Hong, MD Authorized by: Irean Hong, MD   Consent:    Consent obtained:  Verbal   Consent given by:  Patient   Risks, benefits, and  alternatives were discussed: yes     Risks discussed:  Bleeding, infection, nasal injury and pain Anesthesia:    Anesthesia method:  Topical application   Topical anesthetic:  Lidocaine gel Procedure details:    Treatment site:  R posterior   Treatment method:  Merocel sponge   Treatment episode: initial   Post-procedure details:    Assessment:  Bleeding stopped   Procedure completion:  Tolerated well, no immediate complications Comments:     No bleeding from posterior oropharynx after Merocel placed.  CRITICAL CARE Performed by: Irean Hong   Total critical care time: 15 minutes  Critical care time was exclusive of separately billable procedures and treating other patients.  Critical care was necessary to treat or prevent imminent or life-threatening deterioration.  Critical care was time spent personally by me on the following activities: development of treatment plan with patient and/or surrogate as well as nursing, discussions with consultants, evaluation of patient's response to treatment, examination of patient, obtaining history from patient or surrogate, ordering and performing treatments and interventions, ordering and review of laboratory studies, ordering and review of radiographic  studies, pulse oximetry and re-evaluation of patient's condition.  ____________________________________________   INITIAL IMPRESSION / ASSESSMENT AND PLAN / ED COURSE  As part of my medical decision making, I reviewed the following data within the electronic MEDICAL RECORD NUMBER Nursing notes reviewed and incorporated, Labs reviewed, EKG interpreted, Old chart reviewed, and Notes from prior ED visits     86 year old female presenting with right-sided epistaxis, on Xarelto.  Merocel sponge successfully placed upon patient's arrival to the treatment room.  Will obtain basic lab work, IV Zofran for nausea.  Will monitor.   Clinical Course as of 04/09/21 0642  Thu Apr 09, 2021  0533 Merocel  packing in place.  No bleeding.  Posterior oropharynx clear.  Room air saturations 82-85%; nonrebreather oxygen applied. [JS]  0550 No bleeding.  Will discuss with hospital services for admission. [JS]    Clinical Course User Index [JS] Irean Hong, MD     ____________________________________________   FINAL CLINICAL IMPRESSION(S) / ED DIAGNOSES  Final diagnoses:  Right-sided epistaxis  Hypoxia     ED Discharge Orders     None        Note:  This document was prepared using Dragon voice recognition software and may include unintentional dictation errors.    Irean Hong, MD 04/09/21 [phone]

## 2021-04-09 NOTE — ED Notes (Signed)
Pt's O2 sat ranging 84-85 on RA. Pt in NAD. EDP made aware and stated to place pt on NRB. O2 100% on NRB

## 2021-04-09 NOTE — ED Notes (Signed)
Went to check on pt, nose clamp removed no active bleeding noted

## 2021-04-09 NOTE — ED Notes (Signed)
Pt nose began bleeding again- Dr Erma Heritage made aware, see orders

## 2021-04-10 DIAGNOSIS — J9621 Acute and chronic respiratory failure with hypoxia: Secondary | ICD-10-CM | POA: Diagnosis not present

## 2021-04-10 DIAGNOSIS — D62 Acute posthemorrhagic anemia: Secondary | ICD-10-CM

## 2021-04-10 DIAGNOSIS — R935 Abnormal findings on diagnostic imaging of other abdominal regions, including retroperitoneum: Secondary | ICD-10-CM | POA: Diagnosis present

## 2021-04-10 DIAGNOSIS — R040 Epistaxis: Secondary | ICD-10-CM | POA: Diagnosis not present

## 2021-04-10 DIAGNOSIS — I480 Paroxysmal atrial fibrillation: Secondary | ICD-10-CM

## 2021-04-10 DIAGNOSIS — E440 Moderate protein-calorie malnutrition: Secondary | ICD-10-CM | POA: Diagnosis present

## 2021-04-10 LAB — CBC
HCT: 33.9 % — ABNORMAL LOW (ref 36.0–46.0)
Hemoglobin: 11.4 g/dL — ABNORMAL LOW (ref 12.0–15.0)
MCH: 31.6 pg (ref 26.0–34.0)
MCHC: 33.6 g/dL (ref 30.0–36.0)
MCV: 93.9 fL (ref 80.0–100.0)
Platelets: 198 10*3/uL (ref 150–400)
RBC: 3.61 MIL/uL — ABNORMAL LOW (ref 3.87–5.11)
RDW: 13 % (ref 11.5–15.5)
WBC: 5.2 10*3/uL (ref 4.0–10.5)
nRBC: 0 % (ref 0.0–0.2)

## 2021-04-10 LAB — BASIC METABOLIC PANEL WITH GFR
Anion gap: 7 (ref 5–15)
BUN: 15 mg/dL (ref 8–23)
CO2: 33 mmol/L — ABNORMAL HIGH (ref 22–32)
Calcium: 8.6 mg/dL — ABNORMAL LOW (ref 8.9–10.3)
Chloride: 92 mmol/L — ABNORMAL LOW (ref 98–111)
Creatinine, Ser: 0.72 mg/dL (ref 0.44–1.00)
GFR, Estimated: >60 mL/min
Glucose, Bld: 100 mg/dL — ABNORMAL HIGH (ref 70–99)
Potassium: 4.1 mmol/L (ref 3.5–5.1)
Sodium: 132 mmol/L — ABNORMAL LOW (ref 135–145)

## 2021-04-10 MED ORDER — SALINE SPRAY 0.65 % NA SOLN
2.0000 | Freq: Three times a day (TID) | NASAL | Status: DC
  Administered 2021-04-10 – 2021-04-11 (×4): 2 via NASAL
  Filled 2021-04-10: qty 44

## 2021-04-10 MED ORDER — OXYMETAZOLINE HCL 0.05 % NA SOLN
2.0000 | Freq: Two times a day (BID) | NASAL | Status: DC
  Administered 2021-04-10 – 2021-04-11 (×3): 2 via NASAL
  Filled 2021-04-10: qty 15

## 2021-04-10 NOTE — Assessment & Plan Note (Signed)
Not on levothyroxine 

## 2021-04-10 NOTE — Progress Notes (Signed)
Initial Nutrition Assessment  DOCUMENTATION CODES:  Non-severe (moderate) malnutrition in context of chronic illness, Underweight  INTERVENTION:  Obtain updated weight.  Recommend liberalizing diet to regular.  Add Magic cup TID with meals, each supplement provides 290 kcal and 9 grams of protein.  Continue MVI with minerals daily.  NUTRITION DIAGNOSIS:  Moderate Malnutrition related to chronic illness (A-fib) as evidenced by moderate fat depletion, moderate muscle depletion.  GOAL:  Patient will meet greater than or equal to 90% of their needs  MONITOR:  PO intake, Supplement acceptance, Diet advancement, Labs, Weight trends, I & O's  REASON FOR ASSESSMENT:  Other (Comment) (Low BMI)    ASSESSMENT:  86 yo female with a PMH of atrial fibrillation, HTN, and hypothyroidism who presents with acute on chronic respiratory failure and nosebleed from R nare.  Spoke with pt at bedside. Pt reports eating well at home and cooking for herself, which she enjoys. She reports that she likes eating healthier items that she can cook herself.  RD suspects pt does not meet nutrition needs consistently given low BMI.  Per Epic, pt ate 90% of breakfast this morning and 100% of lunch this afternoon.  She denies any changes in her weight. She reports she usually weighs around 114-124 lbs, and has for years.  Pt's weight appears to be copied from previous weight over two years ago. RD to order measured weight to determine weight changes.  Recommend adding Magic Cup TID. Also recommend continuing MVI with minerals and liberalizing diet to regular. Communicated last recommendation to MD via secure chat.  Medications: reviewed; OsCal, Vitamin D3, lactobacillus TID, MVI with minerals, Vitamin E  Labs: reviewed; Na 132 (L - trending down), Glucose 100 (H)  NUTRITION - FOCUSED PHYSICAL EXAM: Flowsheet Row Most Recent Value  Orbital Region Moderate depletion  Upper Arm Region Moderate depletion   Thoracic and Lumbar Region Moderate depletion  Buccal Region Moderate depletion  Temple Region Moderate depletion  Clavicle Bone Region Moderate depletion  Clavicle and Acromion Bone Region Moderate depletion  Scapular Bone Region Mild depletion  Dorsal Hand Moderate depletion  Patellar Region Moderate depletion  Anterior Thigh Region Moderate depletion  Posterior Calf Region Moderate depletion  Edema (RD Assessment) None  Hair Reviewed  Eyes Reviewed  Mouth Reviewed  Skin Reviewed  Nails Reviewed   Diet Order:   Diet Order             Diet regular Room service appropriate? Yes; Fluid consistency: Thin  Diet effective now                  EDUCATION NEEDS:  Education needs have been addressed  Skin:  Skin Assessment: Reviewed RN Assessment  Last BM:  04/09/21  Height:  Ht Readings from Last 1 Encounters:  04/09/21 5\' 11"  (1.803 m)   Weight:  Wt Readings from Last 1 Encounters:  04/09/21 53.5 kg   BMI:  Body mass index is 16.45 kg/m.  Estimated Nutritional Needs:  Kcal:  1600-1800 Protein:  65-80 grams Fluid:  >1.6 L  04/11/21, RD, LDN (she/her/hers) Registered Dietitian I After-Hours/Weekend Pager # in [place]

## 2021-04-10 NOTE — Progress Notes (Signed)
  Progress Note    Kaylee Santiago   NOM:[id number]  DOB: [birth year]  DOA: 04/09/2021     0 Date of Service: 04/10/2021    Brief summary: Kaylee Santiago is an 86 y.o. F with Afib on Xarelto, HTN who presented with epistaxis.  This was packed with Merocel in the ER, but patient had continued bleeding, then developed hypoxia to 82% and tachypnea, and so was admitted.      Subjective:  Improvement noted in bleeding, still some oozing every few minutes. and no new fever, pain, respiratory distress, no dizziness, fatigue, dyspnea with exertion  Hospital Problems Epistaxis - Saline nasal spray - Afrin PRN - Maintain packing - Continue cephalexin - Consult ENT  Acute blood loss anemia Hgb dropped 2 g/dL overnight.  Bleeding appears to have slowed.   - No transfusion at this time  AF (paroxysmal atrial fibrillation) (HCC) CHA2DS2-Vasc 4, mostly for age.  Safe to hold Xarelto for now - Continue metoprolol - Hold Xarelto  Acute on chronic respiratory failure with hypoxia (HCC) After packing, patient developed new hypoxia to 82-85%, dyspnea, tachypnea.    Overnight, this improved, weaned off O2.  Hypertension BP normal - Continue metoprolol  Hypothyroidism Not on levothyroxine  Malnutrition of moderate degree As evidenced by reduced subcutaneous muscle mass and fat.  Gastric finding on CT      Incidental      - Outaptient GI referal  Hyponatremia      Asymptomatic     Objective Vital signs were reviewed and unremarkable except for: Blood pressure: slightly elevated   Exam General appearance: Elderly female, lying in bed, no acute distress.     HEENT:   Packing in the right nare, some thin bloody discharge.  No deformity of the nose.  Oropharynx moist. Skin:  Cardiac: RRR, no murmurs, no lower extremity edema, no JVD Respiratory: Normal respiratory rate and rhythm, lungs clear without rales or wheezes Abdomen: Abdomen soft without tenderness palpation or guarding, no  ascites or distention MSK: Moderately reduced subcutaneous muscle mass and fat diffusely Neuro: Awake alert, extraocular movements intact, moves all extremities with generalized weakness, gait normal, speech fluent, face symmetric Psych: Attention normal, affect appropriate, judgment insight appear normal    Labs / Other Information My review of labs, imaging, notes and other tests is significant for Drop in hemoglobin, sodium 132, bicarb 33, chest x-ray clear, CT with gastric thickening   Spoke to Niece by phone    Time spent: 25 minutes [facility] 04/10/2021, 3:10 PM

## 2021-04-10 NOTE — Hospital Course (Signed)
Kaylee Santiago is an 86 y.o. F with Afib on Xarelto, HTN who presented with epistaxis.  This was packed with Merocel in the ER, but patient had continued bleeding, then developed hypoxia to 82% and tachypnea, and so was admitted.

## 2021-04-10 NOTE — Progress Notes (Signed)
Mobility Specialist - Progress Note   04/10/21 1100  Mobility  Activity Ambulated in hall  Level of Assistance Independent  Assistive Device None  Distance Ambulated (ft) 160 ft  Mobility Ambulated independently in hallway  Mobility Response Tolerated well  Mobility performed by Mobility specialist  $Mobility charge 1 Mobility    Pt ambulated in hallway independently.    Filiberto Pinks Mobility Specialist 04/10/21, 11:47 AM

## 2021-04-10 NOTE — Plan of Care (Signed)

## 2021-04-10 NOTE — Assessment & Plan Note (Signed)
Hgb dropped 2 g/dL overnight.  Bleeding appears to have slowed.   - No transfusion at this time

## 2021-04-10 NOTE — Assessment & Plan Note (Addendum)
After packing, patient developed new hypoxia to 82-85%, dyspnea, tachypnea.    Overnight, this improved, weaned off O2.

## 2021-04-10 NOTE — Assessment & Plan Note (Addendum)
CHA2DS2-Vasc 4, mostly for age.  Safe to hold Xarelto for now - Continue metoprolol - Hold Xarelto

## 2021-04-10 NOTE — Progress Notes (Signed)
..  04/10/2021 8:41 AM  States, [name] [id number]    Temp:  [97.6 F (36.4 C)-98.2 F (36.8 C)] 97.7 F (36.5 C) (09/30 0500) Pulse Rate:  [70-95] 70 (09/30 0712) Resp:  [14-27] 18 (09/30 0712) BP: (119-162)/(62-83) 145/71 (09/30 0712) SpO2:  [90 %-96 %] 94 % (09/30 0712),    No intake or output data in the 24 hours ending 04/10/21 0841  Results for orders placed or performed during the hospital encounter of 04/09/21 (from the past 24 hour(s))  Basic metabolic panel     Status: Abnormal   Collection Time: 04/10/21  6:29 AM  Result Value Ref Range   Sodium 132 (L) 135 - 145 mmol/L   Potassium 4.1 3.5 - 5.1 mmol/L   Chloride 92 (L) 98 - 111 mmol/L   CO2 33 (H) 22 - 32 mmol/L   Glucose, Bld 100 (H) 70 - 99 mg/dL   BUN 15 8 - 23 mg/dL   Creatinine, Ser 0.17 0.44 - 1.00 mg/dL   Calcium 8.6 (L) 8.9 - 10.3 mg/dL   GFR, Estimated >51 >02 mL/min   Anion gap 7 5 - 15  CBC     Status: Abnormal   Collection Time: 04/10/21  6:29 AM  Result Value Ref Range   WBC 5.2 4.0 - 10.5 K/uL   RBC 3.61 (L) 3.87 - 5.11 MIL/uL   Hemoglobin 11.4 (L) 12.0 - 15.0 g/dL   HCT 58.5 (L) 27.7 - 82.4 %   MCV 93.9 80.0 - 100.0 fL   MCH 31.6 26.0 - 34.0 pg   MCHC 33.6 30.0 - 36.0 g/dL   RDW 23.5 36.1 - 44.3 %   Platelets 198 150 - 400 K/uL   nRBC 0.0 0.0 - 0.2 %    SUBJECTIVE:  No acute events.  Ambulating to bathroom.  Patient reports oozing when eating but appears to be mucus and old blood.  Patient denies pain  OBJECTIVE:  GEN- NAD, eating breakfast sitting upright in bed constantly dabbing nasal packing NOSE-  packing in place with similar appearance of old blood  OC/OP- no posterior bleeding  IMPRESSION:  Epistaxis and hypoxia  PLAN:  Doing well.  Maybe some slight ooze per notes yesterday but what patient feels is new blood today is mucus and dissolving clot.  Again urged patient to hold off on dabbing of nose as much as possible to prevent recurrent trauma to area bleeding.  Will place on Nasal  saline TID to help clear sponge of old blood.  Afrin prn bleeding.  Hold on blood thinners as long as medically cleared.  Anticipate packing removal on Tuesday.  If patient does well, anticipate cleared for d/c tomorrow from ENT perspective.  Kaylee Santiago 04/10/2021, 8:41 AM

## 2021-04-10 NOTE — Assessment & Plan Note (Signed)
BP normal - Continue metoprolol 

## 2021-04-10 NOTE — Care Management Obs Status (Signed)
MEDICARE OBSERVATION STATUS NOTIFICATION   Patient Details  Name: Kaylee Santiago MRN: [id number] Date of Birth: 1935-04-09   Medicare Observation Status Notification Given:  Yes    Margarito Liner, LCSW 04/10/2021, 2:17 PM

## 2021-04-10 NOTE — Assessment & Plan Note (Signed)
As evidenced by reduced subcutaneous muscle mass and fat.

## 2021-04-10 NOTE — Assessment & Plan Note (Addendum)
-   Saline nasal spray - Afrin PRN - Maintain packing - Continue cephalexin - Consult ENT

## 2021-04-11 DIAGNOSIS — J9621 Acute and chronic respiratory failure with hypoxia: Secondary | ICD-10-CM | POA: Diagnosis not present

## 2021-04-11 DIAGNOSIS — D62 Acute posthemorrhagic anemia: Secondary | ICD-10-CM | POA: Diagnosis not present

## 2021-04-11 DIAGNOSIS — I480 Paroxysmal atrial fibrillation: Secondary | ICD-10-CM | POA: Diagnosis not present

## 2021-04-11 DIAGNOSIS — R935 Abnormal findings on diagnostic imaging of other abdominal regions, including retroperitoneum: Secondary | ICD-10-CM

## 2021-04-11 DIAGNOSIS — R040 Epistaxis: Secondary | ICD-10-CM | POA: Diagnosis not present

## 2021-04-11 MED ORDER — AMLODIPINE BESYLATE 5 MG PO TABS: 5.0000 mg | ORAL_TABLET | Freq: Every day | ORAL | Status: DC

## 2021-04-11 MED ORDER — CEPHALEXIN 500 MG PO CAPS
500.0000 mg | ORAL_CAPSULE | Freq: Two times a day (BID) | ORAL | 0 refills | Status: DC
Start: 2021-04-11 — End: 2021-04-11

## 2021-04-11 MED ORDER — AMLODIPINE BESYLATE 5 MG PO TABS
2.5000 mg | ORAL_TABLET | Freq: Every day | ORAL | Status: DC
  Administered 2021-04-11: 2.5 mg via ORAL
  Filled 2021-04-11: qty 1

## 2021-04-11 MED ORDER — OXYMETAZOLINE HCL 0.05 % NA SOLN: 2.0000 | Freq: Two times a day (BID) | NASAL | 0 refills | Status: DC

## 2021-04-11 MED ORDER — AMLODIPINE BESYLATE 2.5 MG PO TABS: 2.5000 mg | ORAL_TABLET | Freq: Every day | ORAL | 1 refills | Status: DC

## 2021-04-11 MED ORDER — OXYMETAZOLINE HCL 0.05 % NA SOLN
2.0000 | Freq: Two times a day (BID) | NASAL | 0 refills | Status: DC
Start: 2021-04-11 — End: 2021-04-11

## 2021-04-11 MED ORDER — CEPHALEXIN 500 MG PO CAPS: 500.0000 mg | ORAL_CAPSULE | Freq: Two times a day (BID) | ORAL | 0 refills | Status: DC

## 2021-04-11 NOTE — Progress Notes (Signed)
..  04/11/2021 9:53 AM  Kaylee Santiago, Kaylee Santiago [id number]    Temp:  [97.3 F (36.3 C)-97.5 F (36.4 C)] 97.3 F (36.3 C) (10/01 0750) Pulse Rate:  [81-90] 89 (10/01 0750) Resp:  [16-20] 17 (10/01 0750) BP: (151-176)/(69-93) 176/82 (10/01 0750) SpO2:  [94 %-97 %] 97 % (10/01 0750) Weight:  [51.7 kg] 51.7 kg (10/01 0435),     Intake/Output Summary (Last 24 hours) at 04/11/2021 0953 Last data filed at 04/10/2021 1300 Gross per 24 hour  Intake 240 ml  Output --  Net 240 ml    No results found for this or any previous visit (from the past 24 hour(s)).  SUBJECTIVE:  Patient reports two bleeding episodes, one last night after getting nasal spray and another this morning.  No active bleeding right now.  Had some nausea with some blood clots she spit out earlier  OBJECTIVE:  GEN- NAD NOSE- packing in place 10cm on right.  No active bleeding noted OC/OP- no posterior oropharyngeal bleeding  IMPRESSION:  Epistaxis, hypertension  PLAN:  Discussed options with patient regarding removal of packing and repacking or observation given good control but rare intermittent bleeding.  Reviewed vitals and patient has been hypertensive at 176/82 this morning at time of bleeding as well as we are just at the 48 hour mark of holding the Xarelto.  Recommend observation and strict BP control.  If we are outside the 48 hour window of blood thinner and BP well controlled and continues to intermittently bleed, consider removal of pack but it will need to be replaced with another.  Kaylee Santiago 04/11/2021, 9:53 AM

## 2021-04-11 NOTE — Discharge Summary (Signed)
Physician Discharge Summary  Kaylee Santiago JJK:[id number] DOB: 05-13-1935 DOA: 04/09/2021  PCP: Lynnea Ferrier, MD  Admit date: 04/09/2021 Discharge date: 04/11/2021  Admitted From: Home  Disposition:  Home   Recommendations for Outpatient Follow-up:  Follow up with ENT Dr. Andee Poles in 3 days Follow up with Dr. Graciela Husbands PCP in 1 week Dr. Graciela Husbands: Gastric thickening noted on CT abdomen ordered prior to admission     Home Health: None  Equipment/Devices: None new  Discharge Condition: Good  CODE STATUS: FULL Diet recommendation: Regular  Brief/Interim Summary: Kaylee Santiago is an 86 y.o. F with Afib on Xarelto, HTN who presented with epistaxis.  This was packed with Merocel in the ER, but patient had continued bleeding, then developed hypoxia to 82% and tachypnea, and so was admitted.         PRINCIPAL HOSPITAL DIAGNOSIS: Epistaxis    Discharge Diagnoses:   Epistaxis Patient admitted and Merocel packing placed.  She had hypoxia and so was admitted for observation.  Started on cephalexin.  She had persistent bleeding on hospital day two, and ENT recommended additional observation.    On HD 3, bleeding slowed, hypoxia was completely resolved, she ate and ambulated without difficulty, and was stable for discharge.    Acute blood loss anemia   AF (paroxysmal atrial fibrillation) (HCC) CHA2DS2-Vasc 4, mostly for age.  Safe to hold Xarelto.  Would hold Xarelto until packing removed on Tuesday, could be resumed then, or within the next 1-2 weeks after, per patient and ENT preference.      Acute on chronic respiratory failure with hypoxia (HCC) After packing, patient developed new hypoxia to 82-85%, dyspnea, tachypnea.  Overnight, this improved, and she was weaned off O2.    Hypertension BP elevated.  Amlodipine added. - Continue metoprolol   Hypothyroidism   Malnutrition of moderate degree  Gastric finding on CT Incidental finding.  Discussed with patient, defer to PCP.    Hyponatremia          Discharge Instructions  Discharge Instructions     Discharge instructions   Complete by: As directed    You were admitted for severe nose bleed. Your Xarelto was stopped and your nose was packed with Merocel.  If the nose packing gets crusty or stiff, moisten it with saline. To prevent infection, take cephalexin 500 mg twice daily until gone  If you have nose bleeding, apply Afrin spray (this constricts blood vessels) then apply pressure with a cold spoon as Dr. Andee Poles instructed  If that doesn't work after 30 minutes, call Dr. Gregary Cromer office (number listed below) or come back to the ER.  Go see Dr. Andee Poles on Tuesday morning  Call his office tomorrow to schedule  For your blood pressure: Take your home metoprolol Start the new medicine amlodipine and go see Dr. Graciela Husbands      If you have other questions about your treatment, call the nursing desk here at [phone]   Increase activity slowly   Complete by: As directed       Allergies as of 04/11/2021       Reactions   Amoxicillin-pot Clavulanate Diarrhea   Levofloxacin Diarrhea        Medication List     STOP taking these medications    rivaroxaban 20 MG Tabs tablet Commonly known as: XARELTO   tiZANidine 2 MG tablet Commonly known as: ZANAFLEX       TAKE these medications    amLODipine 2.5 MG tablet Commonly known  as: NORVASC Take 1 tablet (2.5 mg total) by mouth daily. Start taking on: April 12, 2021   Biotin 5000 MCG Tabs Take 5,000 mcg by mouth daily.   CALTRATE 600+D3 PO Take 1 tablet by mouth daily.   cephALEXin 500 MG capsule Commonly known as: KEFLEX Take 1 capsule (500 mg total) by mouth every 12 (twelve) hours.   HYDROcodone-acetaminophen 5-325 MG tablet Commonly known as: NORCO/VICODIN Take 1 tablet by mouth every 6 (six) hours as needed for moderate pain.   levothyroxine 75 MCG tablet Commonly known as: SYNTHROID Take 1 tablet by mouth  daily. What changed: Another medication with the same name was removed. Continue taking this medication, and follow the directions you see here.   LORazepam 0.5 MG tablet Commonly known as: ATIVAN Take 0.5 mg by mouth at bedtime as needed for sleep.   metoprolol succinate 50 MG 24 hr tablet Commonly known as: TOPROL-XL Take 50 mg by mouth in the morning and at bedtime. Take 50 mg in the morning and 25 mg in the evening   MULTI-VITAMIN DAILY PO Take 1 tablet by mouth daily.   oxymetazoline 0.05 % nasal spray Commonly known as: AFRIN Place 2 sprays into both nostrils 2 (two) times daily.   Probiotic 250 MG Caps Take 1 capsule by mouth daily.   vitamin E 180 MG (400 UNITS) capsule Take 400 Units by mouth daily.        Follow-up Information     Lynnea Ferrier, MD. Go in 3 week(s).   Specialty: Internal Medicine Contact information: [address] [place] [address] [phone]         Bud Face, MD. Schedule an appointment as soon as possible for a visit in 3 day(s).   Specialty: Otolaryngology Contact information: [address] [place] [address] [place] [address] [phone]                Allergies  Allergen Reactions   Amoxicillin-Pot Clavulanate Diarrhea   Levofloxacin Diarrhea    Consultations: ENT Dr. Andee Poles   Procedures/Studies: CT ABDOMEN PELVIS W CONTRAST  Result Date: 04/08/2021 CLINICAL DATA:  Intermittent diarrhea mid lower abdominal pain for 1 month. History atrial fibrillation, hypertension, former smoker EXAM: CT ABDOMEN AND PELVIS WITH CONTRAST TECHNIQUE: Multidetector CT imaging of the abdomen and pelvis was performed using the standard protocol following bolus administration of intravenous contrast. Sagittal and coronal MPR images reconstructed from axial data set. CONTRAST:  61mL OMNIPAQUE IOHEXOL 350 MG/ML SOLN IV. Dilute oral contrast. COMPARISON:  01/01/2019 FINDINGS: Lower chest: Few tiny basilar lung  nodules. Calcified granuloma LEFT lower lobe with volume loss and bronchiectasis in LEFT lower lobe. Minimal atelectasis/scarring RIGHT lung base. Basilar lung changes appear stable since the previous exam. Hepatobiliary: Few tiny hepatic cysts. Liver and gallbladder otherwise normal appearance. Pancreas: Atrophic without mass Spleen: Normal appearance Adrenals/Urinary Tract: Adrenal glands, kidneys, ureters, and bladder normal appearance Stomach/Bowel: Appendix not identified. Cecum located in central pelvis. Large and small bowel loops normal appearance. Stool noted in rectum. Gastric wall appears thickened proximally though stomach is underdistended. Vascular/Lymphatic: Enlargement of cardiac chambers. Atherosclerotic calcifications aorta, iliac arteries, origins of visceral arteries. Scattered pelvic phleboliths. No adenopathy. Reproductive: Uterus surgically absent. Questionable visualization of a RIGHT ovary laterally in RIGHT pelvis. LEFT ovary not seen. Other: No free air or free fluid.  No hernia. Musculoskeletal: Osseous demineralization. Probable bone island RIGHT iliac wing. Compression fractures of L1 and L2 with prior vertebroplasty of L1. IMPRESSION: Question diffuse wall  thickening of proximal stomach versus artifact from underdistention; recommend correlation with upper endoscopy to exclude gastritis and gastric neoplasm. No other definite intra-abdominal or intrapelvic abnormalities. Chronic LEFT lower lobe volume loss and bronchiectasis as well as RIGHT basilar scarring and tiny bibasilar nodular foci, unchanged. Enlargement of cardiac chambers. Aortic Atherosclerosis (ICD10-I70.0). Electronically Signed   By: Ulyses Southward M.D.   On: 04/08/2021 15:39   DG Chest Port 1 View  Result Date: 04/09/2021 CLINICAL DATA:  86 year old female with hypoxia. EXAM: PORTABLE CHEST 1 VIEW COMPARISON:  Chest CT 03/26/2020 and earlier. FINDINGS: Portable AP upright view at 0619 hours. Pulmonary hyperinflation  with bronchiectasis and middle, lower lobe pulmonary atypical infection demonstrated by CT last year. Stable lung volumes and mediastinal contours. Mild cardiomegaly. Visualized tracheal air column is within normal limits. Streaky and nodular left lung base opacity has not significantly changed. No pneumothorax, pulmonary edema, pleural effusion or new pulmonary opacity. Osteopenia. Stable visualized osseous structures. IMPRESSION: Chronic lung disease and mild cardiomegaly with no new cardiopulmonary abnormality. Electronically Signed   By: Odessa Fleming M.D.   On: 04/09/2021 06:32      Subjective: Feeling well.  Ate and ambulated without difficulty.  No dyspnea on exertion, no dizziness, no fatigue. Bleeding has probably slowed.  Discharge Exam: Vitals:   04/11/21 0750 04/11/21 1501  BP: (!) 176/82 (!) 152/69  Pulse: 89 77  Resp: 17 17  Temp: (!) 97.3 F (36.3 C) 97.6 F (36.4 C)  SpO2: 97% 95%   Vitals:   04/11/21 0433 04/11/21 0435 04/11/21 0750 04/11/21 1501  BP: (!) 155/93  (!) 176/82 (!) 152/69  Pulse: 81  89 77  Resp: 20  17 17   Temp: (!) 97.5 F (36.4 C)  (!) 97.3 F (36.3 C) 97.6 F (36.4 C)  TempSrc: Oral  Oral Oral  SpO2: 94%  97% 95%  Weight:  51.7 kg    Height:        General: Pt is alert, awake, not in acute distress HEENT: Nose is packed on the right, no bleeding or oozing noted by me. Cardiovascular: RRR, nl S1-S2, no murmurs appreciated.   No LE edema.   Respiratory: Normal respiratory rate and rhythm.  CTAB without rales or wheezes. Abdominal: Abdomen soft and non-tender.  No distension or HSM.   Neuro/Psych: Strength symmetric in upper and lower extremities.  Judgment and insight appear normal.   The results of significant diagnostics from this hospitalization (including imaging, microbiology, ancillary and laboratory) are listed below for reference.     Microbiology: Recent Results (from the past 240 hour(s))  Resp Panel by RT-PCR (Flu A&B, Covid)  Nasopharyngeal Swab     Status: None   Collection Time: 04/09/21  5:51 AM   Specimen: Nasopharyngeal Swab; Nasopharyngeal(NP) swabs in vial transport medium  Result Value Ref Range Status   SARS Coronavirus 2 by RT PCR NEGATIVE NEGATIVE Final    Comment: (NOTE) SARS-CoV-2 target nucleic acids are NOT DETECTED.  The SARS-CoV-2 RNA is generally detectable in upper respiratory specimens during the acute phase of infection. The lowest concentration of SARS-CoV-2 viral copies this assay can detect is 138 copies/mL. A negative result does not preclude SARS-Cov-2 infection and should not be used as the sole basis for treatment or other patient management decisions. A negative result may occur with  improper specimen collection/handling, submission of specimen other than nasopharyngeal swab, presence of viral mutation(s) within the areas targeted by this assay, and inadequate number of viral copies(<138 copies/mL). A negative  result must be combined with clinical observations, patient history, and epidemiological information. The expected result is Negative.  Fact Sheet for Patients:  BloggerCourse.com  Fact Sheet for Healthcare Providers:  SeriousBroker.it  This test is no t yet approved or cleared by the [place] FDA and  has been authorized for detection and/or diagnosis of SARS-CoV-2 by FDA under an Emergency Use Authorization (EUA). This EUA will remain  in effect (meaning this test can be used) for the duration of the COVID-19 declaration under Section 564(b)(1) of the Act, 21 U.S.C.section 360bbb-3(b)(1), unless the authorization is terminated  or revoked sooner.       Influenza A by PCR NEGATIVE NEGATIVE Final   Influenza B by PCR NEGATIVE NEGATIVE Final    Comment: (NOTE) The Xpert Xpress SARS-CoV-2/FLU/RSV plus assay is intended as an aid in the diagnosis of influenza from Nasopharyngeal swab specimens and should not be  used as a sole basis for treatment. Nasal washings and aspirates are unacceptable for Xpert Xpress SARS-CoV-2/FLU/RSV testing.  Fact Sheet for Patients: BloggerCourse.com  Fact Sheet for Healthcare Providers: SeriousBroker.it  This test is not yet approved or cleared by the [place] FDA and has been authorized for detection and/or diagnosis of SARS-CoV-2 by FDA under an Emergency Use Authorization (EUA). This EUA will remain in effect (meaning this test can be used) for the duration of the COVID-19 declaration under Section 564(b)(1) of the Act, 21 U.S.C. section 360bbb-3(b)(1), unless the authorization is terminated or revoked.  Performed at [facility], [address] [place], [place] [address]      Labs: BNP (last 3 results) No results for input(s): BNP in the last 8760 hours. Basic Metabolic Panel: Recent Labs  Lab 04/09/21 0511 04/10/21 0629  NA 134* 132*  K 4.0 4.1  CL 93* 92*  CO2 32 33*  GLUCOSE 136* 100*  BUN 14 15  CREATININE 0.65 0.72  CALCIUM 9.3 8.6*   Liver Function Tests: No results for input(s): AST, ALT, ALKPHOS, BILITOT, PROT, ALBUMIN in the last 168 hours. No results for input(s): LIPASE, AMYLASE in the last 168 hours. No results for input(s): AMMONIA in the last 168 hours. CBC: Recent Labs  Lab 04/09/21 0511 04/10/21 0629  WBC 7.4 5.2  NEUTROABS 5.7  --   HGB 13.6 11.4*  HCT 40.9 33.9*  MCV 93.8 93.9  PLT 237 198   Cardiac Enzymes: No results for input(s): CKTOTAL, CKMB, CKMBINDEX, TROPONINI in the last 168 hours. BNP: Invalid input(s): POCBNP CBG: No results for input(s): GLUCAP in the last 168 hours. D-Dimer No results for input(s): DDIMER in the last 72 hours. Hgb A1c No results for input(s): HGBA1C in the last 72 hours. Lipid Profile No results for input(s): CHOL, HDL, LDLCALC, TRIG, CHOLHDL, LDLDIRECT in the last 72 hours. Thyroid function studies No results  for input(s): TSH, T4TOTAL, T3FREE, THYROIDAB in the last 72 hours.  Invalid input(s): FREET3 Anemia work up No results for input(s): VITAMINB12, FOLATE, FERRITIN, TIBC, IRON, RETICCTPCT in the last 72 hours. Urinalysis    Component Value Date/Time   COLORURINE STRAW (A) 01/01/2019 1510   APPEARANCEUR CLEAR (A) 01/01/2019 1510   LABSPEC 1.005 01/01/2019 1510   PHURINE 7.0 01/01/2019 1510   GLUCOSEU NEGATIVE 01/01/2019 1510   HGBUR SMALL (A) 01/01/2019 1510   BILIRUBINUR NEGATIVE 01/01/2019 1510   KETONESUR NEGATIVE 01/01/2019 1510   PROTEINUR NEGATIVE 01/01/2019 1510   NITRITE NEGATIVE 01/01/2019 1510   LEUKOCYTESUR NEGATIVE 01/01/2019 1510   Sepsis Labs Invalid input(s):  PROCALCITONIN,  WBC,  LACTICIDVEN Microbiology Recent Results (from the past 240 hour(s))  Resp Panel by RT-PCR (Flu A&B, Covid) Nasopharyngeal Swab     Status: None   Collection Time: 04/09/21  5:51 AM   Specimen: Nasopharyngeal Swab; Nasopharyngeal(NP) swabs in vial transport medium  Result Value Ref Range Status   SARS Coronavirus 2 by RT PCR NEGATIVE NEGATIVE Final    Comment: (NOTE) SARS-CoV-2 target nucleic acids are NOT DETECTED.  The SARS-CoV-2 RNA is generally detectable in upper respiratory specimens during the acute phase of infection. The lowest concentration of SARS-CoV-2 viral copies this assay can detect is 138 copies/mL. A negative result does not preclude SARS-Cov-2 infection and should not be used as the sole basis for treatment or other patient management decisions. A negative result may occur with  improper specimen collection/handling, submission of specimen other than nasopharyngeal swab, presence of viral mutation(s) within the areas targeted by this assay, and inadequate number of viral copies(<138 copies/mL). A negative result must be combined with clinical observations, patient history, and epidemiological information. The expected result is Negative.  Fact Sheet for Patients:   BloggerCourse.com  Fact Sheet for Healthcare Providers:  SeriousBroker.it  This test is no t yet approved or cleared by the [place] FDA and  has been authorized for detection and/or diagnosis of SARS-CoV-2 by FDA under an Emergency Use Authorization (EUA). This EUA will remain  in effect (meaning this test can be used) for the duration of the COVID-19 declaration under Section 564(b)(1) of the Act, 21 U.S.C.section 360bbb-3(b)(1), unless the authorization is terminated  or revoked sooner.       Influenza A by PCR NEGATIVE NEGATIVE Final   Influenza B by PCR NEGATIVE NEGATIVE Final    Comment: (NOTE) The Xpert Xpress SARS-CoV-2/FLU/RSV plus assay is intended as an aid in the diagnosis of influenza from Nasopharyngeal swab specimens and should not be used as a sole basis for treatment. Nasal washings and aspirates are unacceptable for Xpert Xpress SARS-CoV-2/FLU/RSV testing.  Fact Sheet for Patients: BloggerCourse.com  Fact Sheet for Healthcare Providers: SeriousBroker.it  This test is not yet approved or cleared by the [place] FDA and has been authorized for detection and/or diagnosis of SARS-CoV-2 by FDA under an Emergency Use Authorization (EUA). This EUA will remain in effect (meaning this test can be used) for the duration of the COVID-19 declaration under Section 564(b)(1) of the Act, 21 U.S.C. section 360bbb-3(b)(1), unless the authorization is terminated or revoked.  Performed at [facility], [address] [place], [place] [address]      Time coordinating discharge: 35 minutes The [place] controlled substances registry was reviewed for this patient        SIGNED:   Alberteen Sam, MD  [facility] 04/11/2021, 3:21 PM

## 2021-04-11 NOTE — Plan of Care (Signed)
IV removed, discharge instructions reviewed and patient discharged to home with family member

## 2021-05-08 ENCOUNTER — Encounter (HOSPITAL_COMMUNITY): Payer: Self-pay | Admitting: Radiology

## 2021-07-01 ENCOUNTER — Emergency Department: Payer: Medicare Other

## 2021-07-01 ENCOUNTER — Inpatient Hospital Stay
Admission: EM | Admit: 2021-07-01 | Discharge: 2021-07-04 | DRG: 177 | Disposition: A | Discharge status: Home / Self Care | Payer: Medicare Other | Attending: Internal Medicine | Admitting: Internal Medicine

## 2021-07-01 DIAGNOSIS — E039 Hypothyroidism, unspecified: Secondary | ICD-10-CM | POA: Diagnosis present

## 2021-07-01 DIAGNOSIS — E871 Hypo-osmolality and hyponatremia: Secondary | ICD-10-CM

## 2021-07-01 DIAGNOSIS — Z79890 Hormone replacement therapy (postmenopausal): Secondary | ICD-10-CM

## 2021-07-01 DIAGNOSIS — J441 Chronic obstructive pulmonary disease with (acute) exacerbation: Secondary | ICD-10-CM | POA: Diagnosis present

## 2021-07-01 DIAGNOSIS — Z885 Allergy status to narcotic agent status: Secondary | ICD-10-CM | POA: Diagnosis not present

## 2021-07-01 DIAGNOSIS — J1282 Pneumonia due to coronavirus disease 2019: Secondary | ICD-10-CM

## 2021-07-01 DIAGNOSIS — J440 Chronic obstructive pulmonary disease with acute lower respiratory infection: Secondary | ICD-10-CM | POA: Diagnosis present

## 2021-07-01 DIAGNOSIS — I480 Paroxysmal atrial fibrillation: Secondary | ICD-10-CM | POA: Diagnosis present

## 2021-07-01 DIAGNOSIS — U071 COVID-19: Secondary | ICD-10-CM | POA: Diagnosis present

## 2021-07-01 DIAGNOSIS — Z90710 Acquired absence of both cervix and uterus: Secondary | ICD-10-CM | POA: Diagnosis not present

## 2021-07-01 DIAGNOSIS — Z961 Presence of intraocular lens: Secondary | ICD-10-CM | POA: Diagnosis present

## 2021-07-01 DIAGNOSIS — Z881 Allergy status to other antibiotic agents status: Secondary | ICD-10-CM

## 2021-07-01 DIAGNOSIS — Z79899 Other long term (current) drug therapy: Secondary | ICD-10-CM | POA: Diagnosis not present

## 2021-07-01 DIAGNOSIS — Z87898 Personal history of other specified conditions: Secondary | ICD-10-CM

## 2021-07-01 DIAGNOSIS — Z87891 Personal history of nicotine dependence: Secondary | ICD-10-CM

## 2021-07-01 DIAGNOSIS — Z8249 Family history of ischemic heart disease and other diseases of the circulatory system: Secondary | ICD-10-CM

## 2021-07-01 DIAGNOSIS — I4819 Other persistent atrial fibrillation: Secondary | ICD-10-CM | POA: Diagnosis present

## 2021-07-01 DIAGNOSIS — G9341 Metabolic encephalopathy: Secondary | ICD-10-CM | POA: Diagnosis present

## 2021-07-01 DIAGNOSIS — I10 Essential (primary) hypertension: Secondary | ICD-10-CM | POA: Diagnosis present

## 2021-07-01 DIAGNOSIS — E222 Syndrome of inappropriate secretion of antidiuretic hormone: Secondary | ICD-10-CM | POA: Diagnosis present

## 2021-07-01 DIAGNOSIS — R410 Disorientation, unspecified: Secondary | ICD-10-CM

## 2021-07-01 DIAGNOSIS — Z7901 Long term (current) use of anticoagulants: Secondary | ICD-10-CM | POA: Diagnosis not present

## 2021-07-01 DIAGNOSIS — J479 Bronchiectasis, uncomplicated: Secondary | ICD-10-CM

## 2021-07-01 DIAGNOSIS — J9611 Chronic respiratory failure with hypoxia: Secondary | ICD-10-CM

## 2021-07-01 DIAGNOSIS — J180 Bronchopneumonia, unspecified organism: Secondary | ICD-10-CM

## 2021-07-01 LAB — COMPREHENSIVE METABOLIC PANEL
ALT: 42 U/L (ref 0–44)
AST: 54 U/L — ABNORMAL HIGH (ref 15–41)
Albumin: 3.6 g/dL (ref 3.5–5.0)
Alkaline Phosphatase: 104 U/L (ref 38–126)
Anion gap: 8 (ref 5–15)
BUN: 11 mg/dL (ref 8–23)
CO2: 29 mmol/L (ref 22–32)
Calcium: 8.3 mg/dL — ABNORMAL LOW (ref 8.9–10.3)
Chloride: 84 mmol/L — ABNORMAL LOW (ref 98–111)
Creatinine, Ser: 0.43 mg/dL — ABNORMAL LOW (ref 0.44–1.00)
GFR, Estimated: >60 mL/min (ref >60)
Glucose, Bld: 108 mg/dL — ABNORMAL HIGH (ref 70–99)
Potassium: 3.6 mmol/L (ref 3.5–5.1)
Sodium: 121 mmol/L — ABNORMAL LOW (ref 135–145)
Total Bilirubin: 0.6 mg/dL (ref 0.3–1.2)
Total Protein: 7.1 g/dL (ref 6.5–8.1)

## 2021-07-01 LAB — CBC WITH DIFFERENTIAL/PLATELET
Abs Immature Granulocytes: 0.03 10*3/uL (ref 0.00–0.07)
Basophils Absolute: 0 10*3/uL (ref 0.0–0.1)
Basophils Relative: 0 %
Eosinophils Absolute: 0 10*3/uL (ref 0.0–0.5)
Eosinophils Relative: 0 %
HCT: 36.6 % (ref 36.0–46.0)
Hemoglobin: 12.3 g/dL (ref 12.0–15.0)
Immature Granulocytes: 0 %
Lymphocytes Relative: 7 %
Lymphs Abs: 0.6 10*3/uL — ABNORMAL LOW (ref 0.7–4.0)
MCH: 30.1 pg (ref 26.0–34.0)
MCHC: 33.6 g/dL (ref 30.0–36.0)
MCV: 89.7 fL (ref 80.0–100.0)
Monocytes Absolute: 1 10*3/uL (ref 0.1–1.0)
Monocytes Relative: 11 %
Neutro Abs: 7.1 10*3/uL (ref 1.7–7.7)
Neutrophils Relative %: 82 %
Platelets: 223 10*3/uL (ref 150–400)
RBC: 4.08 MIL/uL (ref 3.87–5.11)
RDW: 12.7 % (ref 11.5–15.5)
WBC: 8.7 10*3/uL (ref 4.0–10.5)
nRBC: 0 % (ref 0.0–0.2)

## 2021-07-01 LAB — RESP PANEL BY RT-PCR (FLU A&B, COVID) ARPGX2
Influenza A by PCR: NEGATIVE
Influenza B by PCR: NEGATIVE
SARS Coronavirus 2 by RT PCR: POSITIVE — AB

## 2021-07-01 LAB — TROPONIN I (HIGH SENSITIVITY): Troponin I (High Sensitivity): 10 ng/L (ref <18)

## 2021-07-01 LAB — PROCALCITONIN: Procalcitonin: <0.1 ng/mL

## 2021-07-01 IMAGING — CT CT HEAD W/O CM
4 series · 16 of 47 positions shown, 18 images · non-contrast
Comparison: [DATE]

CLINICAL DATA: Altered mental status, weakness, confusion

EXAM:
CT HEAD WITHOUT CONTRAST
TECHNIQUE: Contiguous axial images were obtained from the base of the skull
through the vertex without intravenous contrast.

[Series 2: head bone · axial · 0.42mm/px · z∈[-139,-109]mm · 3 of 76 slices shown]
[im 8/76  bone]
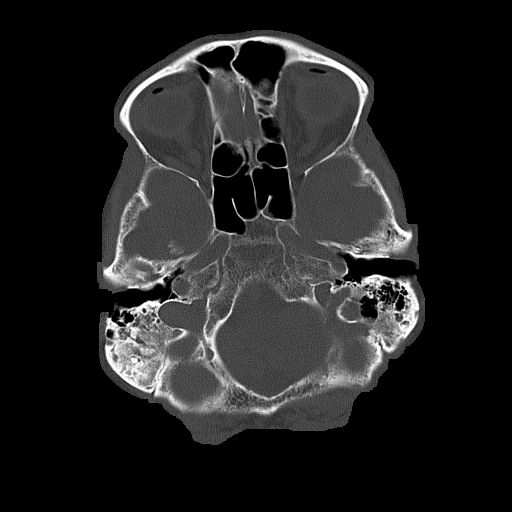
[im 16/76  bone]
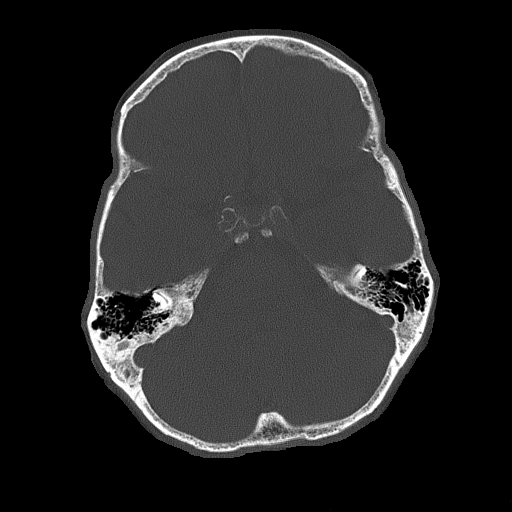
[im 23/76  bone]
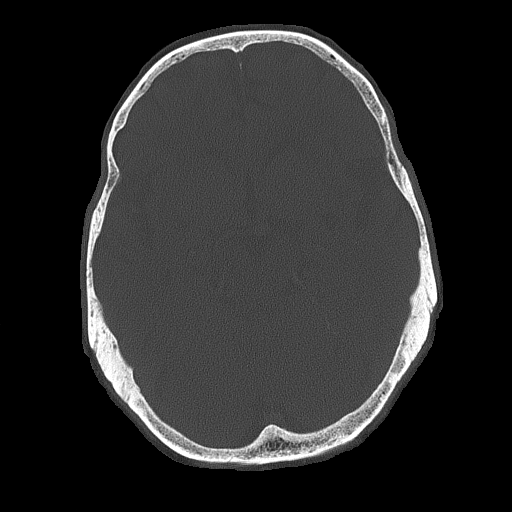

[Series 3: head wo · axial · 0.42mm/px · z∈[-138,-23]mm · 7 of 31 slices shown, 9 images]
[im 4/31  brain]
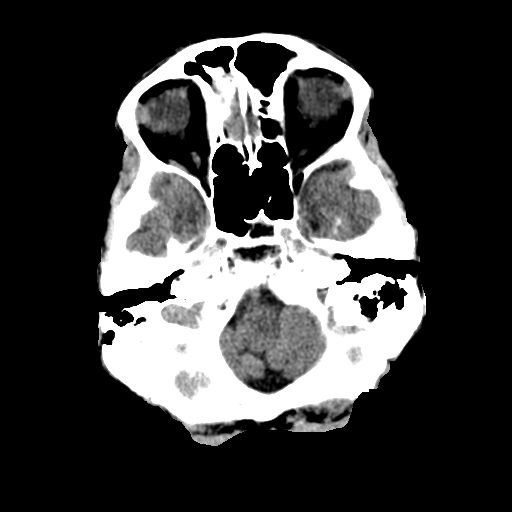
[im 4/31  bone]
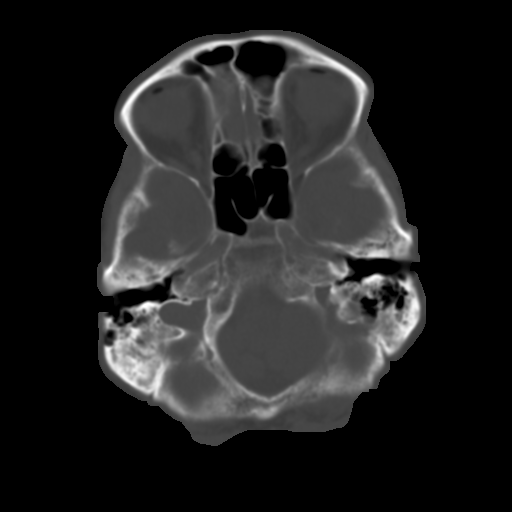
[im 8/31  brain]
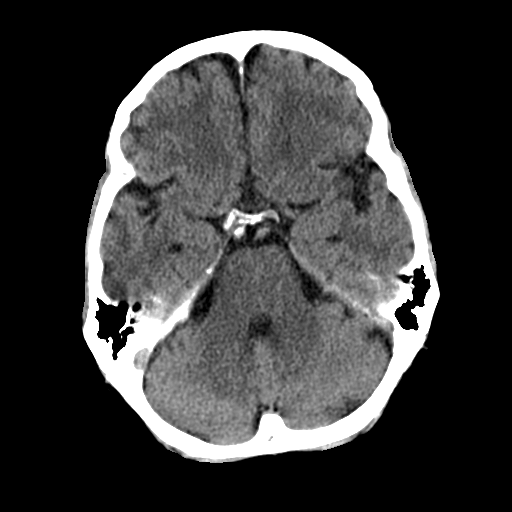
[im 12/31  brain]
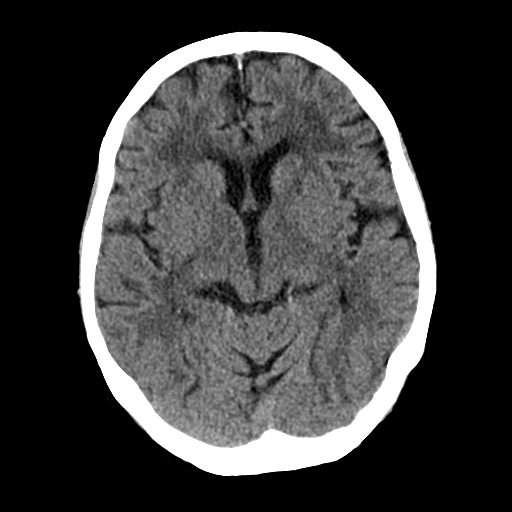
[im 16/31  brain]
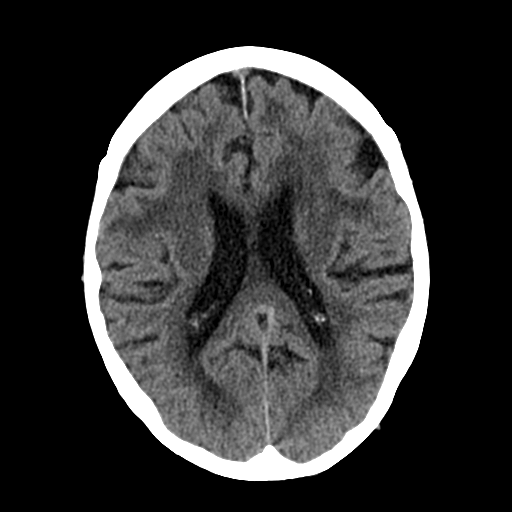
[im 19/31  brain]
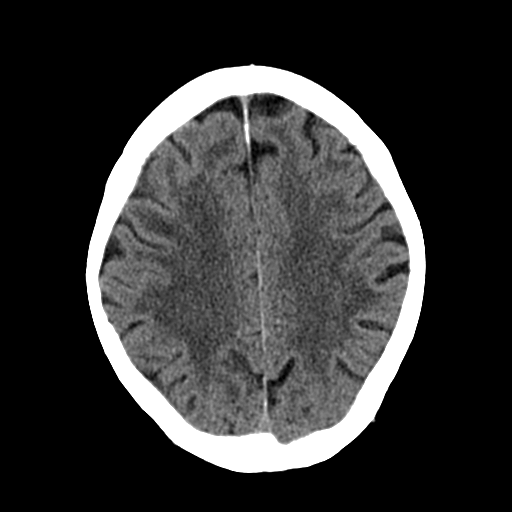
[im 19/31  bone]
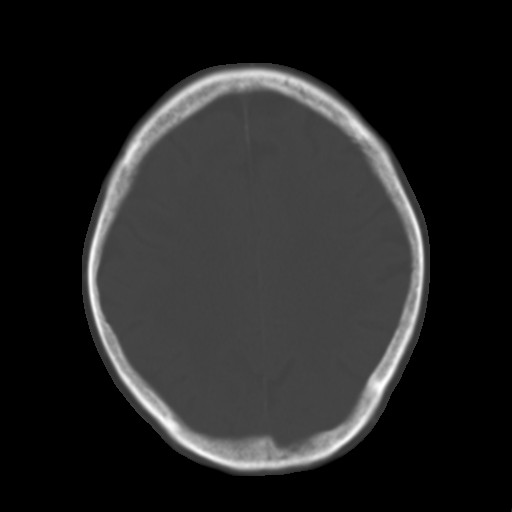
[im 23/31  brain]
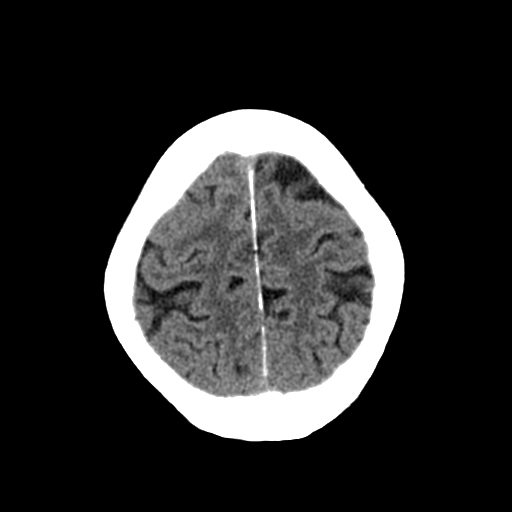
[im 27/31  brain]
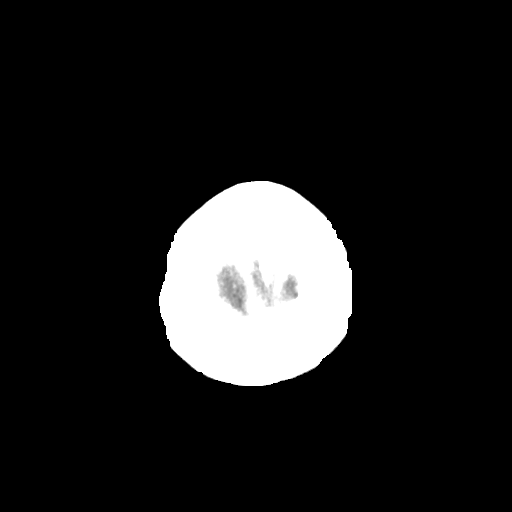

[Series 4: coronal soft tissue · coronal · 0.31mm/px · 3 of 67 slices shown]
[im 23/67  brain]
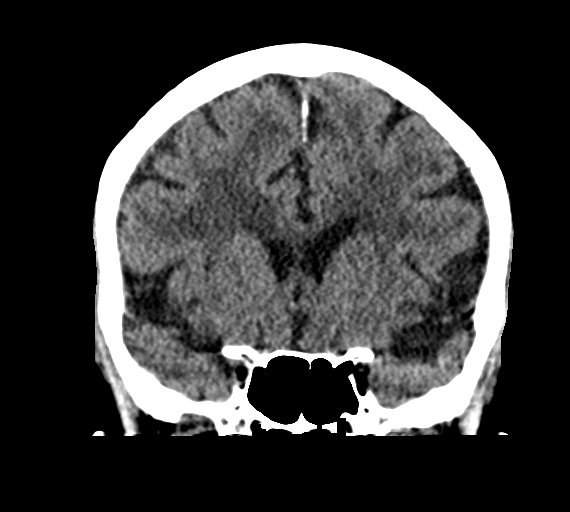
[im 30/67  brain]
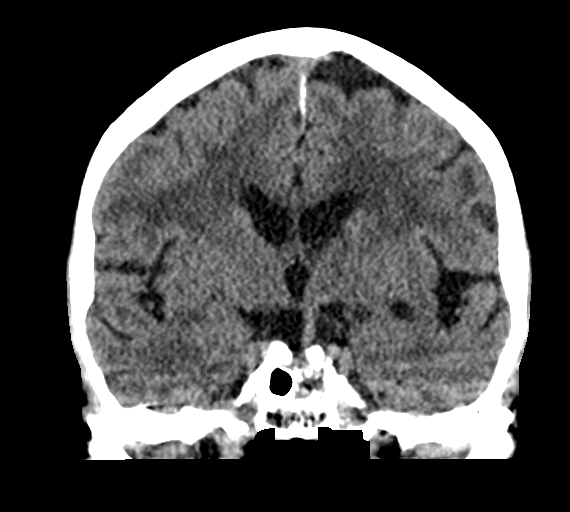
[im 37/67  brain]
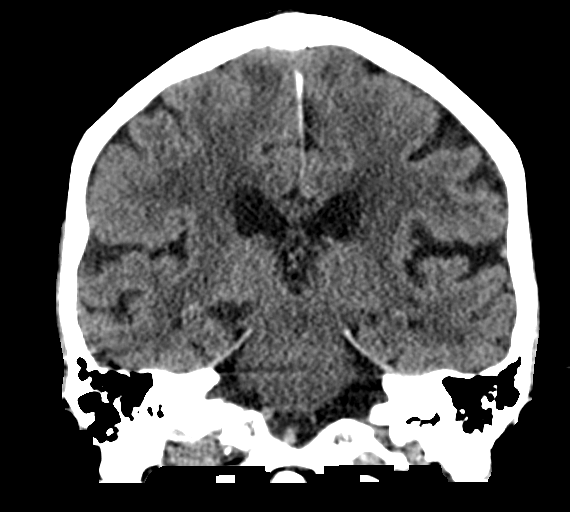

[Series 5: sagittal soft tissue · sagittal · 0.31mm/px · 3 of 56 slices shown]
[im 19/56  brain]
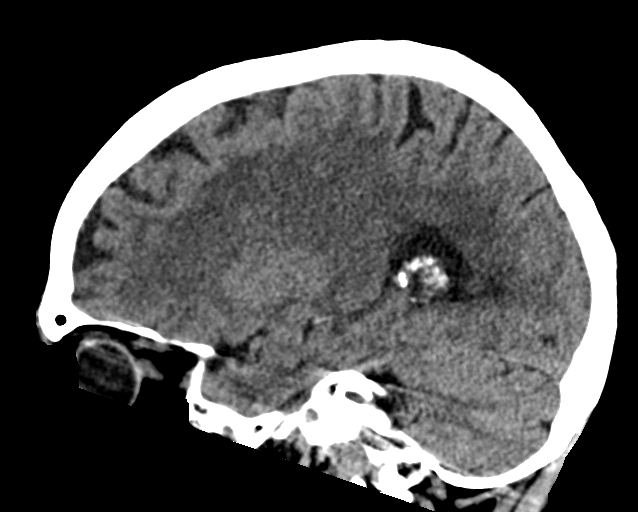
[im 28/56  brain]
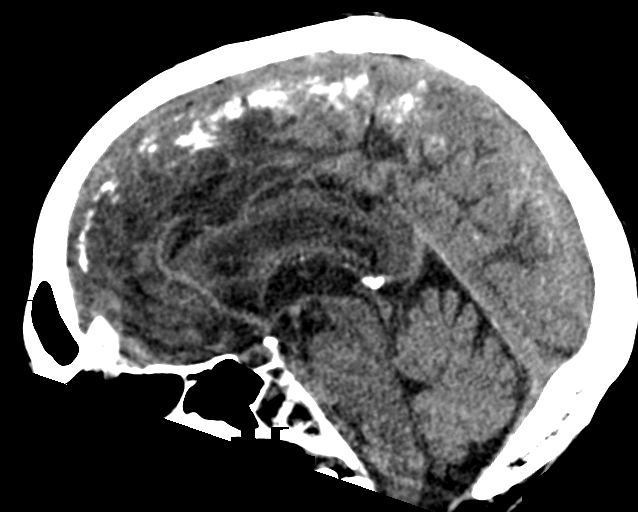
[im 37/56  brain]
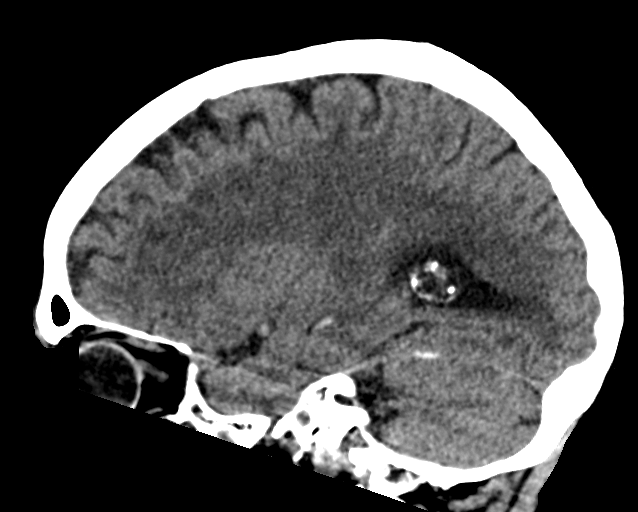

[16 of 47 positions shown; findings below may reference images not displayed]

FINDINGS: Brain: No evidence of acute infarction, hemorrhage, hydrocephalus,
extra-axial collection or mass lesion/mass effect.

Mild age related atrophy. Extensive subcortical white matter and
periventricular small vessel ischemic changes.

Vascular: Intracranial atherosclerosis.

Skull: Normal. Negative for fracture or focal lesion.

Sinuses/Orbits: The visualized paranasal sinuses are essentially
clear. The mastoid air cells are unopacified.

Other: None.
IMPRESSION: No evidence of acute intracranial abnormality. Age related atrophy
with extensive small vessel ischemic changes.

## 2021-07-01 IMAGING — CR DG CHEST 2V
1 series · 2 of 2 positions shown · non-contrast
Comparison: [DATE]

CLINICAL DATA: Shortness of breath. Weakness and confusion.
Diarrhea and cough.

EXAM:
CHEST - 2 VIEW

[Series 1: dg chest 2 view · 0.14mm/px · 2 of 2 slices shown]
[im 1/2]
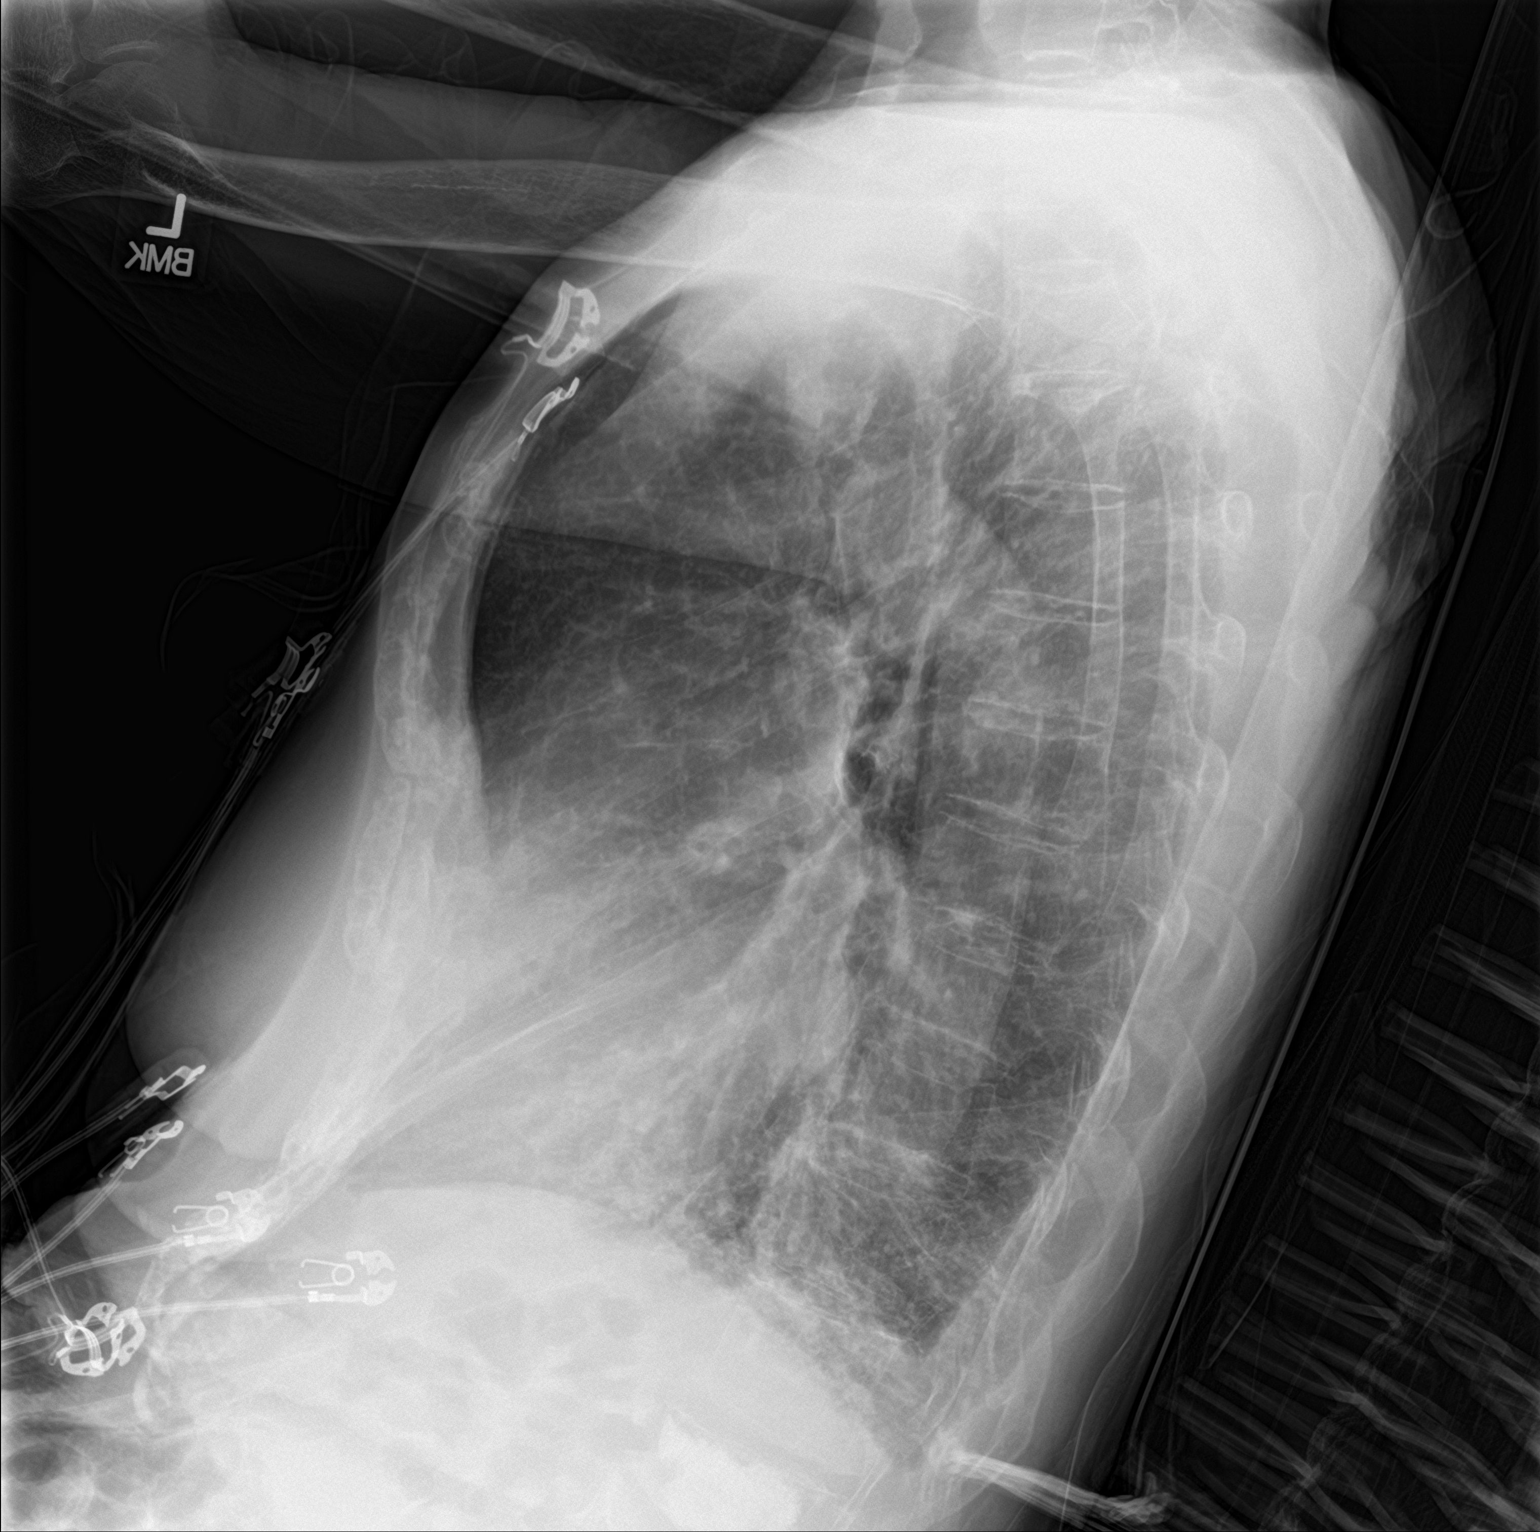
[im 2/2]
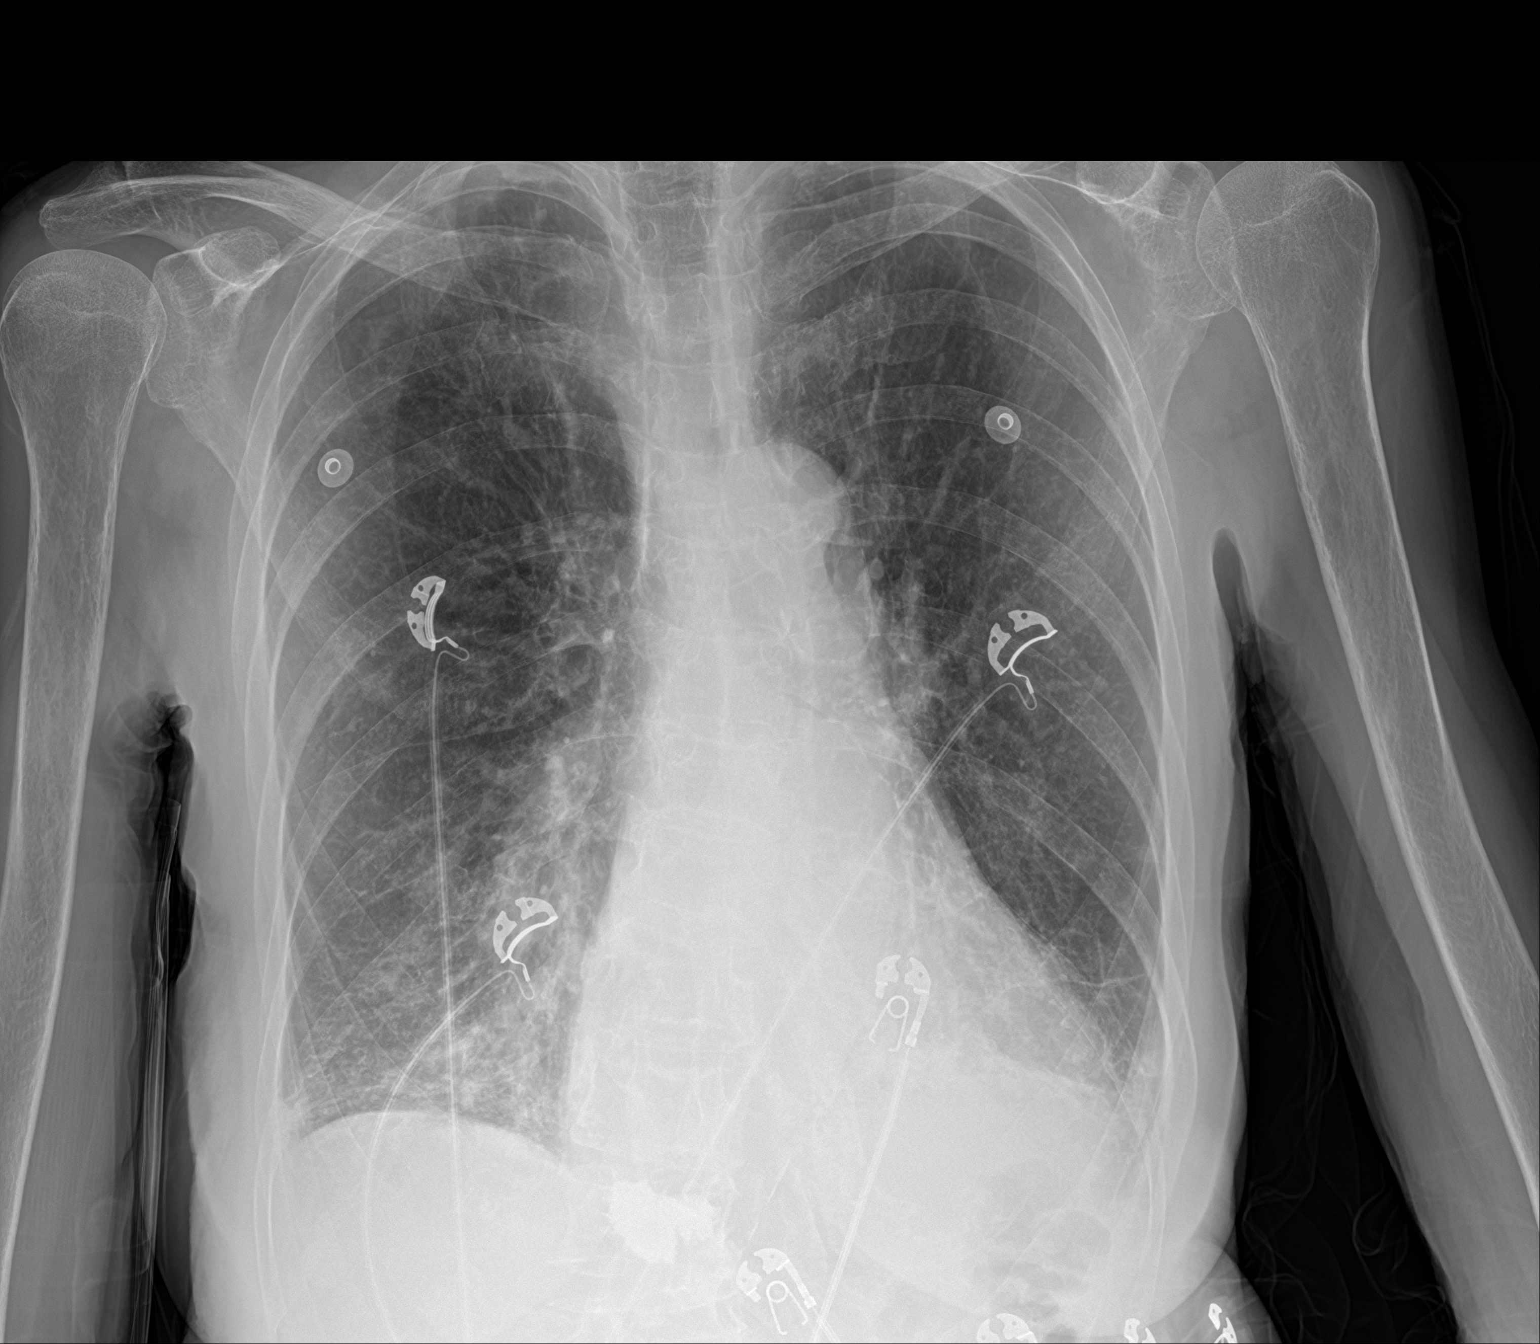

[2 of 2 positions shown; findings below may reference images not displayed]

FINDINGS: Continued left lower lobe airspace opacity as shown on the
[DATE] chest radiograph, with associated volume loss.

Increased confluent reticular interstitial accentuation at the right
lung base.

Biapical pleuroparenchymal scarring. Atherosclerotic calcification
of the aortic arch. Borderline cardiomegaly. Bony demineralization.
There is known bronchiectasis in the lower lobes and right middle
lobe.
IMPRESSION: 1. Continued airspace opacity in volume loss in the left lower lobe,
a component of pneumonia is likely. There is also newly accentuated
interstitial accentuation at the right lung base which may reflect
bronchopneumonia.
2. Borderline enlargement of the cardiopericardial silhouette.
3. Known bibasilar bronchiectasis.

## 2021-07-01 MED ORDER — SODIUM CHLORIDE 0.9 % IV BOLUS
1000.0000 mL | Freq: Once | INTRAVENOUS | Status: AC
  Administered 2021-07-01: 22:00:00 1000 mL via INTRAVENOUS

## 2021-07-01 MED ORDER — SODIUM CHLORIDE 0.9 % IV SOLN
100.0000 mg | Freq: Every day | INTRAVENOUS | Status: DC
  Administered 2021-07-02 – 2021-07-04 (×3): 100 mg via INTRAVENOUS
  Filled 2021-07-01 (×3): qty 20
  Filled 2021-07-01: qty 100
  Filled 2021-07-01: qty 20

## 2021-07-01 MED ORDER — SODIUM CHLORIDE 0.9 % IV SOLN
200.0000 mg | Freq: Once | INTRAVENOUS | Status: AC
  Administered 2021-07-02: 03:00:00 200 mg via INTRAVENOUS
  Filled 2021-07-01: qty 40

## 2021-07-01 NOTE — ED Triage Notes (Signed)
Pt complains of feeling general weakness and confusion. Pt states she has had some diarrhea, and cough. Pt was diagnosed with a home covid test yesterday.

## 2021-07-01 NOTE — H&P (Signed)
History and Physical    Kaylee Santiago ATF:[id number] DOB: Nov 19, 1934 DOA: 07/01/2021  PCP: Lynnea Ferrier, MD   Patient coming from: home  I have personally briefly reviewed patient's relevant medical records in [facility]  Chief Complaint: weakness, cough, confusion, covid positive  HPI: Kaylee Santiago is a 86 y.o. female with medical history significant for Paroxysmal A. fib on Xarelto, hypothyroidism, hypertension, bronchiectasis (no longer on home O2) one-time history of severe epistaxis in relation to Xarelto, who presents to the ED with a 2-day history of cough cough, diarrhea, myalgias, and generalized weakness.  Diarrhea was watery about 4 times a day and associated with urgency and incontinence.  She denies nausea or vomiting but had limited oral intake.  She stated over the past 12 to 24 hours she noted that she was feeling confused having trouble thinking which is not usual for her and she is usually very active and lives independently.,  She denies fever or chills.  A home COVID test was positive and she was started on mulnupiravir, but continued to feel very weak and confused so decided to come into the ED.  ED course: On arrival tachycardic at 108, tachypneic at 22 with O2 sat 96% on room air.  BP 188/84 Blood work significant for sodium of 121 with most recent on record 133 on 10/7 on Care Everywhere COVID-positive  EKG, personally viewed and interpreted: A. fib at 109 with nonspecific ST-T wave changes  Imaging: Chest x-ray with continued airspace opacity left lower lobe with possible component of pneumonia and new interstitial accentuation right lung base which may reflect bronchopneumonia.  Known bibasilar bronchiectasis Head CT with no acute intracranial abnormality  Patient started on remdesivir and given an IV saline bolus.  Hospitalist consulted for admission.   Review of Systems: As per HPI otherwise all other systems on review of systems negative.    Assessment/Plan  Acute metabolic encephalopathy - Secondary to acute hyponatremia and COVID encephalopathy - Neurologic checks - Fall and aspiration precautions - Treat acute etiologies as outlined below  Acute hyponatremia - Suspect hypovolemic secondary to diarrhea related to COVID -Patient received an IV fluid bolus in the ED - Continue IV hydration for another liter with close monitoring for worsening COVID symptoms - Monitor serum sodium with goal for correction of 8 mEq per 24 hours - Neurologic checks    Pneumonia due to COVID-19 virus - Remdesivir per pharmacy - Antitussives, albuterol, vitamins - Supplemental oxygen as needed - Airborne precautions  Bronchopneumonia Bronchiectasis (HCC) - Chest x-ray showing possible bronchopneumonia - Follow procalcitonin and if elevated consider adding antibiotics    AF (paroxysmal atrial fibrillation) (HCC) - Continue metoprolol and Xarelto    Hypertension - Continue amlodipine    History of severe epistaxis while on Xarelto October 2022 - Patient was hospitalized for worsening hypoxia from severe epistaxis and was seen by ENT.  Treated with Merisel - No acute disease at this time   DVT prophylaxis: Xarelto Code Status: full code  Family Communication:  none  Disposition Plan: Back to previous home environment Consults called: none  Status:At the time of admission, it appears that the appropriate admission status for this patient is INPATIENT. This is judged to be reasonable and necessary in order to provide the required intensity of service to ensure the patient's safety given the presenting symptoms, physical exam findings, and initial radiographic and laboratory data in the context of their  Comorbid conditions.   Patient requires inpatient status  due to high intensity of service, high risk for further deterioration and high frequency of surveillance required.   I certify that at the point of admission it is my clinical  judgment that the patient will require inpatient hospital care spanning beyond 2 midnights     Physical Exam: Vitals:   07/01/21 2102 07/01/21 2103 07/01/21 2205  BP: (!) 197/107  (!) 188/84  Pulse: (!) 107  (!) 108  Resp: 18  (!) 22  Temp:  97.8 F (36.6 C)   TempSrc:  Oral   SpO2: 97%  96%   Constitutional: Alert, oriented x 3 . Not in any apparent distress HEENT:      Head: Normocephalic and atraumatic.         Eyes: PERLA, EOMI, Conjunctivae are normal. Sclera is non-icteric.       Mouth/Throat: Mucous membranes are moist.       Neck: Supple with no signs of meningismus. Cardiovascular: Regular rate and rhythm. No murmurs, gallops, or rubs. 2+ symmetrical distal pulses are present . No JVD. No  LE edema Respiratory: Respiratory effort increased with coarse breath sounds bilaterally Gastrointestinal: Soft, non tender, non distended. Positive bowel sounds.  Genitourinary: No CVA tenderness. Musculoskeletal: Nontender with normal range of motion in all extremities. No cyanosis, or erythema of extremities. Neurologic:  Face is symmetric. Moving all extremities. No gross focal neurologic deficits . Skin: Skin is warm, dry.  No rash or ulcers Psychiatric: Mood and affect are appropriate     Past Medical History:  Diagnosis Date   Atrial fibrillation (HCC)    Atrial fibrillation (HCC)    Back pain    Dysrhythmia    Hypertension    Hypothyroidism     Past Surgical History:  Procedure Laterality Date   ABDOMINAL HYSTERECTOMY     BREAST EXCISIONAL BIOPSY Right 70's   CATARACT EXTRACTION W/ INTRAOCULAR LENS  IMPLANT, BILATERAL     KYPHOPLASTY N/A 12/21/2018   Procedure: KYPHOPLASTY L1;  Surgeon: Kennedy Bucker, MD;  Location: [facility] ORS;  Service: Orthopedics;  Laterality: N/A;     reports that she has quit smoking. Her smoking use included cigarettes. She has never used smokeless tobacco. She reports that she does not currently use alcohol. She reports that she does not use  drugs.  Allergies  Allergen Reactions   Amoxicillin-Pot Clavulanate Diarrhea   Levofloxacin Diarrhea   Trazodone Other (See Comments)    nightmares    Family History  Problem Relation Age of Onset   Breast cancer Sister 86   Breast cancer Sister        72's   CAD Mother       Prior to Admission medications   Medication Sig Start Date End Date Taking? Authorizing Provider  Biotin 5000 MCG TABS Take 5,000 mcg by mouth daily.   Yes [provider]  Calcium Carb-Cholecalciferol (CALTRATE 600+D3 PO) Take 1 tablet by mouth daily.   Yes [provider]  LAGEVRIO 200 MG CAPS capsule Take 4 capsules by mouth 2 (two) times daily. 07/01/21  Yes [provider]  levothyroxine (SYNTHROID) 75 MCG tablet Take 1 tablet by mouth daily. 03/03/21  Yes [provider]  metoprolol succinate (TOPROL-XL) 50 MG 24 hr tablet Take 50 mg by mouth in the morning and at bedtime. Take 50 mg in the morning and 25 mg in the evening 12/26/17  Yes [provider]  Multiple Vitamin (MULTI-VITAMIN DAILY PO) Take 1 tablet by mouth daily.   Yes [provider]  Saccharomyces boulardii (PROBIOTIC) 250 MG CAPS Take 1 capsule by mouth daily.   Yes [provider]  vitamin E 400 UNIT capsule Take 400 Units by mouth daily.   Yes [provider]  XARELTO 20 MG TABS tablet Take 20 mg by mouth daily. 05/12/21  Yes [provider]  amLODipine (NORVASC) 2.5 MG tablet Take 1 tablet (2.5 mg total) by mouth daily. Patient not taking: Reported on 07/01/2021 04/12/21   Alberteen Sam, MD  LORazepam (ATIVAN) 0.5 MG tablet Take 0.5 mg by mouth at bedtime as needed for sleep.    [provider]      Labs on Admission: I have personally reviewed following labs and imaging studies  CBC: Recent Labs  Lab 07/01/21 2155  WBC 8.7  NEUTROABS 7.1  HGB 12.3  HCT 36.6  MCV 89.7  PLT 223   Basic Metabolic Panel: Recent Labs  Lab  07/01/21 2155  NA 121*  K 3.6  CL 84*  CO2 29  GLUCOSE 108*  BUN 11  CREATININE 0.43*  CALCIUM 8.3*   GFR: CrCl cannot be calculated (Unknown ideal weight.). Liver Function Tests: Recent Labs  Lab 07/01/21 2155  AST 54*  ALT 42  ALKPHOS 104  BILITOT 0.6  PROT 7.1  ALBUMIN 3.6   No results for input(s): LIPASE, AMYLASE in the last 168 hours. No results for input(s): AMMONIA in the last 168 hours. Coagulation Profile: No results for input(s): INR, PROTIME in the last 168 hours. Cardiac Enzymes: No results for input(s): CKTOTAL, CKMB, CKMBINDEX, TROPONINI in the last 168 hours. BNP (last 3 results) No results for input(s): PROBNP in the last 8760 hours. HbA1C: No results for input(s): HGBA1C in the last 72 hours. CBG: No results for input(s): GLUCAP in the last 168 hours. Lipid Profile: No results for input(s): CHOL, HDL, LDLCALC, TRIG, CHOLHDL, LDLDIRECT in the last 72 hours. Thyroid Function Tests: No results for input(s): TSH, T4TOTAL, FREET4, T3FREE, THYROIDAB in the last 72 hours. Anemia Panel: No results for input(s): VITAMINB12, FOLATE, FERRITIN, TIBC, IRON, RETICCTPCT in the last 72 hours. Urine analysis:    Component Value Date/Time   COLORURINE STRAW (A) 01/01/2019 1510   APPEARANCEUR CLEAR (A) 01/01/2019 1510   LABSPEC 1.005 01/01/2019 1510   PHURINE 7.0 01/01/2019 1510   GLUCOSEU NEGATIVE 01/01/2019 1510   HGBUR SMALL (A) 01/01/2019 1510   BILIRUBINUR NEGATIVE 01/01/2019 1510   KETONESUR NEGATIVE 01/01/2019 1510   PROTEINUR NEGATIVE 01/01/2019 1510   NITRITE NEGATIVE 01/01/2019 1510   LEUKOCYTESUR NEGATIVE 01/01/2019 1510    Radiological Exams on Admission: DG Chest 2 View  Result Date: 07/01/2021 CLINICAL DATA:  Shortness of breath. Weakness and confusion. Diarrhea and cough. EXAM: CHEST - 2 VIEW COMPARISON:  04/09/2021 FINDINGS: Continued left lower lobe airspace opacity as shown on the 04/09/2021 chest radiograph, with associated volume loss.  Increased confluent reticular interstitial accentuation at the right lung base. Biapical pleuroparenchymal scarring. Atherosclerotic calcification of the aortic arch. Borderline cardiomegaly. Bony demineralization. There is known bronchiectasis in the lower lobes and right middle lobe. IMPRESSION: 1. Continued airspace opacity in volume loss in the left lower lobe, a component of pneumonia is likely. There is also newly accentuated interstitial accentuation at the right lung base which may reflect bronchopneumonia. 2. Borderline enlargement of the cardiopericardial silhouette. 3. Known bibasilar bronchiectasis. Electronically Signed   By: Gaylyn Rong M.D.   On: 07/01/2021 21:33   CT HEAD WO CONTRAST ( )  Result Date: 07/01/2021 CLINICAL DATA:  Altered mental status, weakness, confusion EXAM: CT HEAD WITHOUT CONTRAST TECHNIQUE: Contiguous axial images were obtained from the base of the skull through the vertex without intravenous contrast. COMPARISON:  06/06/2018 FINDINGS: Brain: No evidence of acute infarction, hemorrhage, hydrocephalus, extra-axial collection or mass lesion/mass effect. Mild age related atrophy. Extensive subcortical white matter and periventricular small vessel ischemic changes. Vascular: Intracranial atherosclerosis. Skull: Normal. Negative for fracture or focal lesion. Sinuses/Orbits: The visualized paranasal sinuses are essentially clear. The mastoid air cells are unopacified. Other: None. IMPRESSION: No evidence of acute intracranial abnormality. Age related atrophy with extensive small vessel ischemic changes. Electronically Signed   By: Charline Bills M.D.   On: 07/01/2021 21:46       Andris Baumann MD [facility]   07/01/2021, 11:45 PM

## 2021-07-01 NOTE — Progress Notes (Signed)
Remdesivir - Pharmacy Brief Note   O:  ALT: 42 CXR:  SpO2:96  % on RA   A/P:  Remdesivir 200 mg IVPB once followed by 100 mg IVPB daily x 4 days.   Kaylee Santiago 07/01/2021 11:44 PM

## 2021-07-01 NOTE — ED Provider Notes (Addendum)
Rate and a small pleural effusion on the left.  We will send a procalcitonin  [facility]  ____________________________________________   Event Date/Time   First MD Initiated Contact with Patient 07/01/21 2054     (approximate)  I have reviewed the triage vital signs and the nursing notes.   HISTORY  Chief Complaint Altered Mental Status and Weakness    HPI Kaylee Santiago is a 86 y.o. female with past medical history of atrial fibrillation on Xarelto, hypertension, hypothyroidism who presents with confusion.  Patient tells me that she was diagnosed with COVID yesterday.  Over the last day or so although she cannot really recall exactly when her symptoms started she has been feeling like she cannot think straight.  Having difficulty with memory and overall very bothered by feeling of confusion.  She has had cough and myalgias and some diarrhea this morning.  Denies chest pain shortness of breath abdominal pain nausea or vomiting.  She denies headache visual change numbness or weakness.  Patient was started on molnupriavir by her physician and she took the first dose of this today.  She lives alone.  Called EMS herself.        Past Medical History:  Diagnosis Date   Atrial fibrillation [facility])    Atrial fibrillation (HCC)    Back pain    Dysrhythmia    Hypertension    Hypothyroidism     Patient Active Problem List   Diagnosis Date Noted   Malnutrition of moderate degree 04/10/2021   Acute blood loss anemia 04/10/2021   Abnormal CT of the abdomen 04/10/2021   Acute on chronic respiratory failure with hypoxia (HCC) 04/09/2021   Epistaxis 04/09/2021   AF (paroxysmal atrial fibrillation) (HCC)    Hypothyroidism    Hypertension     Past Surgical History:  Procedure Laterality Date   ABDOMINAL HYSTERECTOMY     BREAST EXCISIONAL BIOPSY Right 70's   CATARACT EXTRACTION W/ INTRAOCULAR LENS  IMPLANT, BILATERAL     KYPHOPLASTY N/A 12/21/2018   Procedure:  KYPHOPLASTY L1;  Surgeon: Kennedy Bucker, MD;  Location: [facility] ORS;  Service: Orthopedics;  Laterality: N/A;    Prior to Admission medications   Medication Sig Start Date End Date Taking? Authorizing Provider  Biotin 5000 MCG TABS Take 5,000 mcg by mouth daily.   Yes [provider]  Calcium Carb-Cholecalciferol (CALTRATE 600+D3 PO) Take 1 tablet by mouth daily.   Yes [provider]  LAGEVRIO 200 MG CAPS capsule Take 4 capsules by mouth 2 (two) times daily. 07/01/21  Yes [provider]  levothyroxine (SYNTHROID) 75 MCG tablet Take 1 tablet by mouth daily. 03/03/21  Yes [provider]  metoprolol succinate (TOPROL-XL) 50 MG 24 hr tablet Take 50 mg by mouth in the morning and at bedtime. Take 50 mg in the morning and 25 mg in the evening 12/26/17  Yes [provider]  Multiple Vitamin (MULTI-VITAMIN DAILY PO) Take 1 tablet by mouth daily.   Yes [provider]  Saccharomyces boulardii (PROBIOTIC) 250 MG CAPS Take 1 capsule by mouth daily.   Yes [provider]  vitamin E 400 UNIT capsule Take 400 Units by mouth daily.   Yes [provider]  XARELTO 20 MG TABS tablet Take 20 mg by mouth daily. 05/12/21  Yes [provider]  amLODipine (NORVASC) 2.5 MG tablet Take 1 tablet (2.5 mg total) by mouth daily. Patient not taking: Reported on 07/01/2021 04/12/21   Alberteen Sam, MD  LORazepam (ATIVAN) 0.5 MG tablet Take 0.5 mg by mouth at bedtime as needed for sleep.    [provider]    Allergies Amoxicillin-pot clavulanate, Levofloxacin, and Trazodone  Family History  Problem Relation Age of Onset   Breast cancer Sister 52   Breast cancer Sister        33's   CAD Mother     Social History Social History   Tobacco Use   Smoking status: Former    Types: Cigarettes   Smokeless tobacco: Never  Vaping Use   Vaping Use: Never used  Substance Use Topics   Alcohol use: Not Currently   Drug use:  Never    Review of Systems   Review of Systems  Constitutional:  Positive for appetite change and fatigue. Negative for chills and fever.  Respiratory:  Positive for cough. Negative for shortness of breath.   Gastrointestinal:  Negative for abdominal pain, constipation, nausea and vomiting.  Genitourinary:  Negative for dysuria.  Neurological:  Negative for headaches.  Psychiatric/Behavioral:  Positive for confusion.   All other systems reviewed and are negative.  Physical Exam Updated Vital Signs BP (!) 188/84    Pulse (!) 108    Temp 97.8 F (36.6 C) (Oral)    Resp (!) 22    SpO2 96%   Physical Exam Vitals and nursing note reviewed.  Constitutional:      General: She is not in acute distress.    Appearance: Normal appearance.  HENT:     Head: Normocephalic and atraumatic.  Eyes:     General: No scleral icterus.    Conjunctiva/sclera: Conjunctivae normal.  Pulmonary:     Effort: Pulmonary effort is normal. No respiratory distress.     Breath sounds: No stridor.  Musculoskeletal:        General: No deformity or signs of injury.     Cervical back: Normal range of motion.  Skin:    General: Skin is dry.     Coloration: Skin is not jaundiced or pale.  Neurological:     General: No focal deficit present.     Mental Status: She is alert and oriented to person, place, and time. Mental status is at baseline.     Comments: Patient is alert and oriented to person and place but thinks it is 2004 She intermittently pauses and thought and has to ask what the previous question was    Psychiatric:        Mood and Affect: Mood normal.        Behavior: Behavior normal.     LABS (all labs ordered are listed, but only abnormal results are displayed)  Labs Reviewed  COMPREHENSIVE METABOLIC PANEL - Abnormal; Notable for the following components:      Result Value   Sodium 121 (*)    Chloride 84 (*)    Glucose, Bld 108 (*)    Creatinine, Ser 0.43 (*)    Calcium 8.3 (*)    AST  54 (*)    All other components within normal limits  CBC WITH DIFFERENTIAL/PLATELET - Abnormal; Notable for the following components:   Lymphs Abs 0.6 (*)    All other components within normal limits  RESP PANEL BY RT-PCR (FLU A&B, COVID) ARPGX2  URINALYSIS, COMPLETE (UACMP) WITH MICROSCOPIC  PROCALCITONIN  TROPONIN I (HIGH SENSITIVITY)   ____________________________________________  EKG  Atrial fibrillation with RVR, rates around 109 left axis deviation normal intervals, no acute ischemic changes ____________________________________________  RADIOLOGY Ky Barban,  personally viewed and evaluated these images (plain radiographs) as part of my medical decision making, as well as reviewing the written report by the radiologist.  ED MD interpretation: I reviewed the chest x-ray which shows airspace opacity in the left lower lobe with pleural effusion, questionable right lower lobe opacity as well    ____________________________________________   PROCEDURES  Procedure(s) performed (including Critical Care):  Procedures   ____________________________________________   INITIAL IMPRESSION / ASSESSMENT AND PLAN / ED COURSE  Patient is a 86 year old female who presents with confusion.  Has tested positive for COVID yesterday at home and was started on the antiviral medication which she took the first dose of today.  She called EMS herself due to feeling like she is not thinking straight.  Patient is alert and she is oriented to person and place but not year.  She cannot provide history but intermittently forgets what question has been asked and is clearly bothered by her difficulty thinking.  Her neurologic exam otherwise is nonfocal.  She is mildly tachypneic tachycardic and she is hypertensive.  Sats are normal on room air.  She has a frequent cough but is not in overt respiratory distress.  Differential includes metabolic abnormality, less likely to be acute CVA in the  setting of her nonfocal exam, hypertensive encephalopathy, delirium secondary to infection.  Will get a chest x-ray and labs as well as a CT head.  CT head is negative.  Her chest x-ray does look like there could be a possible.  Her sodium is 121 which could certainly be contributing to her confusion.  We will give a liter of normal saline.  She does not look particularly dry so this could be SIADH in setting of pulmonary infection.  Ultimately she will require admission.   ____________________________________________   FINAL CLINICAL IMPRESSION(S) / ED DIAGNOSES  Final diagnoses:  Hyponatremia  Confusion     ED Discharge Orders     None        Note:  This document was prepared using Dragon voice recognition software and may include unintentional dictation errors.    Georga Hacking, MD 07/01/21 2259    Georga Hacking, MD 07/01/21 [phone]

## 2021-07-02 DIAGNOSIS — I10 Essential (primary) hypertension: Secondary | ICD-10-CM

## 2021-07-02 DIAGNOSIS — I480 Paroxysmal atrial fibrillation: Secondary | ICD-10-CM | POA: Diagnosis not present

## 2021-07-02 DIAGNOSIS — U071 COVID-19: Principal | ICD-10-CM

## 2021-07-02 DIAGNOSIS — G9341 Metabolic encephalopathy: Secondary | ICD-10-CM

## 2021-07-02 DIAGNOSIS — J1282 Pneumonia due to coronavirus disease 2019: Secondary | ICD-10-CM

## 2021-07-02 DIAGNOSIS — E871 Hypo-osmolality and hyponatremia: Secondary | ICD-10-CM | POA: Diagnosis not present

## 2021-07-02 LAB — OSMOLALITY, URINE: Osmolality, Ur: 376 mOsm/kg (ref 300–900)

## 2021-07-02 LAB — URINALYSIS, COMPLETE (UACMP) WITH MICROSCOPIC
Bacteria, UA: NONE SEEN
Bilirubin Urine: NEGATIVE
Glucose, UA: NEGATIVE mg/dL
Ketones, ur: 80 mg/dL — AB
Leukocytes,Ua: NEGATIVE
Nitrite: NEGATIVE
Protein, ur: NEGATIVE mg/dL
Specific Gravity, Urine: 1.02 (ref 1.005–1.030)
pH: 7 (ref 5.0–8.0)

## 2021-07-02 LAB — SODIUM: Sodium: 122 mmol/L — ABNORMAL LOW (ref 135–145)

## 2021-07-02 MED ORDER — ALBUTEROL SULFATE HFA 108 (90 BASE) MCG/ACT IN AERS
2.0000 | INHALATION_SPRAY | Freq: Four times a day (QID) | RESPIRATORY_TRACT | Status: DC
  Administered 2021-07-02 – 2021-07-04 (×7): 2 via RESPIRATORY_TRACT
  Filled 2021-07-02: qty 6.7

## 2021-07-02 MED ORDER — RIVAROXABAN 20 MG PO TABS
20.0000 mg | ORAL_TABLET | Freq: Every day | ORAL | Status: DC
  Administered 2021-07-03: 17:00:00 20 mg via ORAL
  Filled 2021-07-02 (×3): qty 1

## 2021-07-02 MED ORDER — ONDANSETRON HCL 4 MG/2ML IJ SOLN
4.0000 mg | Freq: Four times a day (QID) | INTRAMUSCULAR | Status: DC | PRN
  Administered 2021-07-02: 11:00:00 4 mg via INTRAVENOUS
  Filled 2021-07-02: qty 2

## 2021-07-02 MED ORDER — METHYLPREDNISOLONE SODIUM SUCC 40 MG IJ SOLR
40.0000 mg | Freq: Every day | INTRAMUSCULAR | Status: DC
  Administered 2021-07-02 – 2021-07-04 (×3): 40 mg via INTRAVENOUS
  Filled 2021-07-02 (×3): qty 1

## 2021-07-02 MED ORDER — ALBUTEROL SULFATE (2.5 MG/3ML) 0.083% IN NEBU: 2.5000 mg | INHALATION_SOLUTION | Freq: Four times a day (QID) | RESPIRATORY_TRACT | Status: DC

## 2021-07-02 MED ORDER — AMLODIPINE BESYLATE 5 MG PO TABS
2.5000 mg | ORAL_TABLET | Freq: Every day | ORAL | Status: DC
  Administered 2021-07-02 – 2021-07-03 (×2): 2.5 mg via ORAL
  Filled 2021-07-02 (×2): qty 1

## 2021-07-02 MED ORDER — ALBUTEROL SULFATE HFA 108 (90 BASE) MCG/ACT IN AERS
2.0000 | INHALATION_SPRAY | Freq: Four times a day (QID) | RESPIRATORY_TRACT | Status: DC | PRN
  Filled 2021-07-02: qty 6.7

## 2021-07-02 MED ORDER — SODIUM CHLORIDE 0.9 % IV BOLUS
1000.0000 mL | Freq: Once | INTRAVENOUS | Status: AC
  Administered 2021-07-02: 03:00:00 1000 mL via INTRAVENOUS

## 2021-07-02 MED ORDER — HYDROCOD POLST-CPM POLST ER 10-8 MG/5ML PO SUER: 5.0000 mL | Freq: Two times a day (BID) | ORAL | Status: DC | PRN

## 2021-07-02 MED ORDER — LEVOTHYROXINE SODIUM 50 MCG PO TABS
75.0000 ug | ORAL_TABLET | Freq: Every day | ORAL | Status: DC
Start: 2021-07-02 — End: 2021-07-04
  Administered 2021-07-02 – 2021-07-04 (×3): 75 ug via ORAL
  Filled 2021-07-02: qty 2
  Filled 2021-07-02: qty 1
  Filled 2021-07-02: qty 2

## 2021-07-02 MED ORDER — METOPROLOL SUCCINATE ER 50 MG PO TB24
50.0000 mg | ORAL_TABLET | Freq: Every day | ORAL | Status: DC
  Administered 2021-07-02 – 2021-07-04 (×3): 50 mg via ORAL
  Filled 2021-07-02 (×3): qty 1

## 2021-07-02 MED ORDER — GUAIFENESIN-DM 100-10 MG/5ML PO SYRP
10.0000 mL | ORAL_SOLUTION | ORAL | Status: DC | PRN
  Administered 2021-07-02: 06:00:00 10 mL via ORAL
  Filled 2021-07-02 (×2): qty 10

## 2021-07-02 MED ORDER — ONDANSETRON HCL 4 MG PO TABS: 4.0000 mg | ORAL_TABLET | Freq: Four times a day (QID) | ORAL | Status: DC | PRN

## 2021-07-02 MED ORDER — ACETAMINOPHEN 325 MG PO TABS
650.0000 mg | ORAL_TABLET | Freq: Four times a day (QID) | ORAL | Status: DC | PRN
  Administered 2021-07-02 – 2021-07-03 (×4): 650 mg via ORAL
  Filled 2021-07-02 (×4): qty 2

## 2021-07-02 MED ORDER — ZINC SULFATE 220 (50 ZN) MG PO CAPS
220.0000 mg | ORAL_CAPSULE | Freq: Every day | ORAL | Status: DC
  Administered 2021-07-02 – 2021-07-04 (×3): 220 mg via ORAL
  Filled 2021-07-02 (×3): qty 1

## 2021-07-02 MED ORDER — ASCORBIC ACID 500 MG PO TABS
500.0000 mg | ORAL_TABLET | Freq: Every day | ORAL | Status: DC
  Administered 2021-07-02 – 2021-07-04 (×3): 500 mg via ORAL
  Filled 2021-07-02 (×3): qty 1

## 2021-07-02 MED ORDER — SODIUM CHLORIDE 0.9 % IV BOLUS
500.0000 mL | Freq: Once | INTRAVENOUS | Status: AC
  Administered 2021-07-02: 13:00:00 500 mL via INTRAVENOUS

## 2021-07-02 NOTE — ED Notes (Signed)
Waiting on xarelto from pharmacy

## 2021-07-02 NOTE — Progress Notes (Signed)
Patient ID: Kaylee Santiago, female   DOB: 1935-06-30, 86 y.o.   MRN: [id number] [facility] PROGRESS NOTE  Leean Vaquez Mohar [id number] DOB: [birth year] DOA: 07/01/2021 PCP: Adin Hector, MD  HPI/Subjective: Patient still feels a little rough.  Still with some cough and wheeze.  Some shortness of breath.  Also found to have a low sodium.  Patient is COVID-positive.  Objective: Vitals:   07/02/21 0900 07/02/21 1114  BP: (!) 157/67 (!) 141/66  Pulse: 94 86  Resp: (!) 21 (!) 22  Temp:  98 F (36.7 C)  SpO2: 100% 97%    Intake/Output Summary (Last 24 hours) at 07/02/2021 1229 Last data filed at 07/02/2021 0422 Gross per 24 hour  Intake 2274.05 ml  Output --  Net 2274.05 ml    ROS: Review of Systems  Respiratory:  Negative for shortness of breath.   Cardiovascular:  Negative for chest pain.  Gastrointestinal:  Negative for abdominal pain, nausea and vomiting.  Exam: Physical Exam HENT:     Head: Normocephalic.     Mouth/Throat:     Pharynx: No oropharyngeal exudate.  Eyes:     General: Lids are normal.     Conjunctiva/sclera: Conjunctivae normal.  Cardiovascular:     Rate and Rhythm: Normal rate and regular rhythm.     Heart sounds: Normal heart sounds, S1 normal and S2 normal.  Pulmonary:     Effort: Pulmonary effort is normal.     Breath sounds: Examination of the right-middle field reveals wheezing. Examination of the left-middle field reveals wheezing. Examination of the right-lower field reveals decreased breath sounds and rhonchi. Examination of the left-lower field reveals decreased breath sounds and rhonchi. Decreased breath sounds, wheezing and rhonchi present. No rales.  Abdominal:     Palpations: Abdomen is soft.     Tenderness: There is no abdominal tenderness.  Musculoskeletal:     Right lower leg: No swelling.     Left lower leg: No swelling.  Skin:    General: Skin is warm.     Findings: No rash.  Neurological:     Mental Status: She is alert  and oriented to person, place, and time.      Scheduled Meds:  albuterol  2 puff Inhalation Q6H   amLODipine  2.5 mg Oral Daily   vitamin C  500 mg Oral Daily   levothyroxine  75 mcg Oral Q0600   methylPREDNISolone (SOLU-MEDROL) injection  40 mg Intravenous Daily   metoprolol succinate  50 mg Oral Daily   rivaroxaban  20 mg Oral Q supper   zinc sulfate  220 mg Oral Daily   Continuous Infusions:  remdesivir 100 mg in NS 100 mL Stopped (07/02/21 1032)   sodium chloride      Assessment/Plan:  COVID-19 pneumonia, COPD exacerbation.  Continue remdesivir.  Add low-dose Solu-Medrol.  Albuterol inhaler.  Check pulse ox on room air with ambulation. Acute metabolic encephalopathy.  Likely from [place] but can also be from hyponatremia Hyponatremia.  Does not appear to be on any medications that can lower the sodium.  We will give a fluid bolus today.  Change diet to regular diet.  Send urine osmolarity and urine sodium. Paroxysmal atrial fibrillation on metoprolol for rate control and Xarelto for anticoagulation Essential hypertension on amlodipine and metoprolol. Diarrhea as outpatient.  Likely secondary to COVID infection Hypothyroidism unspecified on levothyroxine      Code Status:     Code Status Orders  (From admission, onward)  Start     Ordered   07/02/21 0001  Full code  Continuous        07/02/21 0001           Code Status History     Date Active Date Inactive Code Status Order ID Comments User Context   04/09/2021 0906 04/11/2021 2343 Full Code [id number]  Lucile Shutters, MD ED   12/21/2018 1132 12/21/2018 1625 Full Code [id number]  Kennedy Bucker, MD Inpatient   06/29/2018 2140 07/01/2018 1826 Full Code [id number]  Enid Baas, MD ED      Family Communication: Spoke with patient's niece Darl Pikes on the phone Disposition Plan: Status is: Inpatient  Antibiotics: Remdesivir  [employer]

## 2021-07-02 NOTE — ED Notes (Signed)
Alert. VSS. No complaints of discomfort. O2 at 2l.

## 2021-07-03 DIAGNOSIS — J441 Chronic obstructive pulmonary disease with (acute) exacerbation: Secondary | ICD-10-CM | POA: Diagnosis not present

## 2021-07-03 DIAGNOSIS — E871 Hypo-osmolality and hyponatremia: Secondary | ICD-10-CM | POA: Diagnosis not present

## 2021-07-03 DIAGNOSIS — U071 COVID-19: Secondary | ICD-10-CM | POA: Diagnosis not present

## 2021-07-03 DIAGNOSIS — G9341 Metabolic encephalopathy: Secondary | ICD-10-CM | POA: Diagnosis not present

## 2021-07-03 DIAGNOSIS — E039 Hypothyroidism, unspecified: Secondary | ICD-10-CM

## 2021-07-03 LAB — CBC WITH DIFFERENTIAL/PLATELET
Abs Immature Granulocytes: 0.08 10*3/uL — ABNORMAL HIGH (ref 0.00–0.07)
Basophils Absolute: 0 10*3/uL (ref 0.0–0.1)
Basophils Relative: 0 %
Eosinophils Absolute: 0 10*3/uL (ref 0.0–0.5)
Eosinophils Relative: 0 %
HCT: 34.3 % — ABNORMAL LOW (ref 36.0–46.0)
Hemoglobin: 11.7 g/dL — ABNORMAL LOW (ref 12.0–15.0)
Immature Granulocytes: 1 %
Lymphocytes Relative: 5 %
Lymphs Abs: 0.5 10*3/uL — ABNORMAL LOW (ref 0.7–4.0)
MCH: 30.5 pg (ref 26.0–34.0)
MCHC: 34.1 g/dL (ref 30.0–36.0)
MCV: 89.3 fL (ref 80.0–100.0)
Monocytes Absolute: 0.7 10*3/uL (ref 0.1–1.0)
Monocytes Relative: 7 %
Neutro Abs: 8.4 10*3/uL — ABNORMAL HIGH (ref 1.7–7.7)
Neutrophils Relative %: 87 %
Platelets: 203 10*3/uL (ref 150–400)
RBC: 3.84 MIL/uL — ABNORMAL LOW (ref 3.87–5.11)
RDW: 12.7 % (ref 11.5–15.5)
WBC: 9.6 10*3/uL (ref 4.0–10.5)
nRBC: 0 % (ref 0.0–0.2)

## 2021-07-03 LAB — D-DIMER, QUANTITATIVE: D-Dimer, Quant: 1.05 ug/mL-FEU — ABNORMAL HIGH (ref 0.00–0.50)

## 2021-07-03 LAB — COMPREHENSIVE METABOLIC PANEL
ALT: 46 U/L — ABNORMAL HIGH (ref 0–44)
AST: 62 U/L — ABNORMAL HIGH (ref 15–41)
Albumin: 3 g/dL — ABNORMAL LOW (ref 3.5–5.0)
Alkaline Phosphatase: 116 U/L (ref 38–126)
Anion gap: 6 (ref 5–15)
BUN: 12 mg/dL (ref 8–23)
CO2: 28 mmol/L (ref 22–32)
Calcium: 7.4 mg/dL — ABNORMAL LOW (ref 8.9–10.3)
Chloride: 92 mmol/L — ABNORMAL LOW (ref 98–111)
Creatinine, Ser: 0.42 mg/dL — ABNORMAL LOW (ref 0.44–1.00)
GFR, Estimated: >60 mL/min (ref >60)
Glucose, Bld: 115 mg/dL — ABNORMAL HIGH (ref 70–99)
Potassium: 4.1 mmol/L (ref 3.5–5.1)
Sodium: 126 mmol/L — ABNORMAL LOW (ref 135–145)
Total Bilirubin: 0.6 mg/dL (ref 0.3–1.2)
Total Protein: 6.3 g/dL — ABNORMAL LOW (ref 6.5–8.1)

## 2021-07-03 LAB — C-REACTIVE PROTEIN: CRP: 13.6 mg/dL — ABNORMAL HIGH (ref <1.0)

## 2021-07-03 LAB — PHOSPHORUS: Phosphorus: 2.1 mg/dL — ABNORMAL LOW (ref 2.5–4.6)

## 2021-07-03 LAB — SODIUM, URINE, RANDOM: Sodium, Ur: 104 mmol/L

## 2021-07-03 LAB — MAGNESIUM: Magnesium: 1.9 mg/dL (ref 1.7–2.4)

## 2021-07-03 MED ORDER — AMLODIPINE BESYLATE 5 MG PO TABS
5.0000 mg | ORAL_TABLET | Freq: Every day | ORAL | Status: DC
  Administered 2021-07-03 – 2021-07-04 (×2): 5 mg via ORAL
  Filled 2021-07-03 (×2): qty 1

## 2021-07-03 MED ORDER — LORAZEPAM 0.5 MG PO TABS: 0.5000 mg | ORAL_TABLET | Freq: Every evening | ORAL | Status: DC | PRN

## 2021-07-03 NOTE — ED Notes (Signed)
Pt given lunch

## 2021-07-03 NOTE — ED Notes (Signed)
Pt requests to silence monitors, does not want to wear pulse ox while she sleeps. Monitor placed on comfort care setting per her request.

## 2021-07-03 NOTE — ED Notes (Signed)
Patient now in CPod.  Is alert, says she feels confused.  I have instructed to stay on stretcher until her med is finished infusing in about 30 min.  No distress.

## 2021-07-03 NOTE — ED Notes (Signed)
Says she feels confused.  When asked who president is she first stated reagan, but says she knows it is not.  Could not answer that.  She says it is 2022 when asked the year.

## 2021-07-03 NOTE — ED Notes (Signed)
Pt given dinner tray. Bedside commode emptied of urine.

## 2021-07-03 NOTE — ED Notes (Signed)
Pt declined to be placed on the monitor at this time. The patient did however allow this RN to obtain VS then proceeded to ambulate to the commode.

## 2021-07-03 NOTE — ED Notes (Signed)
Pt assisted to bedside commode

## 2021-07-03 NOTE — ED Notes (Addendum)
Pt provided supplies for bed bath, pt declined help. Pt alert and oriented. Ambulatory around room

## 2021-07-03 NOTE — ED Notes (Signed)
Informed RN bed assigned 

## 2021-07-03 NOTE — ED Notes (Signed)
Bedside code emptied of urine

## 2021-07-03 NOTE — Progress Notes (Signed)
Patient ID: Kaylee Santiago, female   DOB: 1935-06-27, 86 y.o.   MRN: [id number] [facility] PROGRESS NOTE  Kaylee Santiago [id number] DOB: Jul 13, 1934 DOA: 07/01/2021 PCP: Lynnea Ferrier, MD  HPI/Subjective: Patient complains that she had lost or misplaced her phone charger.  She feels a little bit better but concerned that she is not thinking right.  She feels that her mental status is not back to her usual.  She is nervous about being a burden on her family.  Objective: Vitals:   07/03/21 1037 07/03/21 1100  BP: (!) 187/84 (!) 168/86  Pulse: 82 76  Resp:  17  Temp: (!) 97.1 F (36.2 C)   SpO2:  96%    ROS: Review of Systems  Respiratory:  Negative for shortness of breath.   Cardiovascular:  Negative for chest pain.  Gastrointestinal:  Negative for abdominal pain, nausea and vomiting.  Exam: Physical Exam HENT:     Head: Normocephalic.     Mouth/Throat:     Pharynx: No oropharyngeal exudate.  Eyes:     General: Lids are normal.     Conjunctiva/sclera: Conjunctivae normal.  Cardiovascular:     Rate and Rhythm: Normal rate and regular rhythm.     Heart sounds: Normal heart sounds, S1 normal and S2 normal.  Pulmonary:     Breath sounds: Examination of the right-lower field reveals decreased breath sounds. Examination of the left-lower field reveals decreased breath sounds. Decreased breath sounds present. No wheezing, rhonchi or rales.  Abdominal:     Palpations: Abdomen is soft.     Tenderness: There is no abdominal tenderness.  Musculoskeletal:     Right lower leg: No swelling.     Left lower leg: No swelling.  Skin:    General: Skin is warm.     Findings: No rash.  Neurological:     Mental Status: She is alert.     Comments: Answers all questions appropriately.  Patient      Scheduled Meds:  albuterol  2 puff Inhalation Q6H   amLODipine  2.5 mg Oral Daily   vitamin C  500 mg Oral Daily   levothyroxine  75 mcg Oral Q0600   methylPREDNISolone  (SOLU-MEDROL) injection  40 mg Intravenous Daily   metoprolol succinate  50 mg Oral Daily   rivaroxaban  20 mg Oral Q supper   zinc sulfate  220 mg Oral Daily   Continuous Infusions:  remdesivir 100 mg in NS 100 mL Stopped (07/03/21 1138)    Assessment/Plan:  COVID-19 pneumonia, COPD exacerbation.  Remdesivir day 3 today.  Continue Solu-Medrol daily.  Continue albuterol inhaler.  Patient off oxygen. Acute metabolic encephalopathy.  Patient feels that her mental status is not back to her usual.  Suspect from hyponatremia and COVID infection. Hyponatremia likely secondary to SIADH.  Urine sodium very high.  We will give fluid restriction today.  Recheck sodium tomorrow.  Continue regular diet.  Trying to hold off on salt tablets.  Reviewed prior CT scan of the abdomen and pelvis from a few months ago.  Reviewed CT scan of the chest from a year ago that showed bronchiectasis and possible MAI.  Reviewed prior labs and her sodiums have been low prior.  Highest sodium recently 136 but a lot of low sodiums in the low 130s. Paroxysmal atrial fibrillation on metoprolol for rate control and Xarelto for anticoagulation Essential hypertension blood pressure up this afternoon will increase Norvasc to 5 mg daily.  Continue metoprolol. Diarrhea likely  secondary to COVID infection.  Patient states that she does get diarrhea a lot at home. Hypothyroidism unspecified on levothyroxine        Code Status:     Code Status Orders  (From admission, onward)           Start     Ordered   07/02/21 0001  Full code  Continuous        07/02/21 0001           Code Status History     Date Active Date Inactive Code Status Order ID Comments User Context   04/09/2021 0906 04/11/2021 2343 Full Code [id number]  Lucile Shutters, MD ED   12/21/2018 1132 12/21/2018 1625 Full Code [id number]  Kennedy Bucker, MD Inpatient   06/29/2018 2140 07/01/2018 1826 Full Code [id number]  Enid Baas, MD ED       Family Communication: Spoke with niece Darl Pikes on the phone Disposition Plan: Status is: Inpatient  [name]

## 2021-07-04 DIAGNOSIS — I480 Paroxysmal atrial fibrillation: Secondary | ICD-10-CM | POA: Diagnosis not present

## 2021-07-04 DIAGNOSIS — G9341 Metabolic encephalopathy: Secondary | ICD-10-CM | POA: Diagnosis not present

## 2021-07-04 DIAGNOSIS — U071 COVID-19: Secondary | ICD-10-CM | POA: Diagnosis not present

## 2021-07-04 DIAGNOSIS — E871 Hypo-osmolality and hyponatremia: Secondary | ICD-10-CM | POA: Diagnosis not present

## 2021-07-04 LAB — CBC WITH DIFFERENTIAL/PLATELET
Abs Immature Granulocytes: 0.1 10*3/uL — ABNORMAL HIGH (ref 0.00–0.07)
Basophils Absolute: 0 10*3/uL (ref 0.0–0.1)
Basophils Relative: 0 %
Eosinophils Absolute: 0 10*3/uL (ref 0.0–0.5)
Eosinophils Relative: 0 %
HCT: 34.7 % — ABNORMAL LOW (ref 36.0–46.0)
Hemoglobin: 12.1 g/dL (ref 12.0–15.0)
Immature Granulocytes: 1 %
Lymphocytes Relative: 5 %
Lymphs Abs: 0.6 10*3/uL — ABNORMAL LOW (ref 0.7–4.0)
MCH: 31.3 pg (ref 26.0–34.0)
MCHC: 34.9 g/dL (ref 30.0–36.0)
MCV: 89.7 fL (ref 80.0–100.0)
Monocytes Absolute: 1 10*3/uL (ref 0.1–1.0)
Monocytes Relative: 8 %
Neutro Abs: 10.6 10*3/uL — ABNORMAL HIGH (ref 1.7–7.7)
Neutrophils Relative %: 86 %
Platelets: 246 10*3/uL (ref 150–400)
RBC: 3.87 MIL/uL (ref 3.87–5.11)
RDW: 12.7 % (ref 11.5–15.5)
WBC: 12.4 10*3/uL — ABNORMAL HIGH (ref 4.0–10.5)
nRBC: 0 % (ref 0.0–0.2)

## 2021-07-04 LAB — COMPREHENSIVE METABOLIC PANEL WITH GFR
ALT: 43 U/L (ref 0–44)
AST: 53 U/L — ABNORMAL HIGH (ref 15–41)
Albumin: 3.3 g/dL — ABNORMAL LOW (ref 3.5–5.0)
Alkaline Phosphatase: 113 U/L (ref 38–126)
Anion gap: 4 — ABNORMAL LOW (ref 5–15)
BUN: 14 mg/dL (ref 8–23)
CO2: 32 mmol/L (ref 22–32)
Calcium: 7.7 mg/dL — ABNORMAL LOW (ref 8.9–10.3)
Chloride: 91 mmol/L — ABNORMAL LOW (ref 98–111)
Creatinine, Ser: 0.46 mg/dL (ref 0.44–1.00)
GFR, Estimated: >60 mL/min
Glucose, Bld: 137 mg/dL — ABNORMAL HIGH (ref 70–99)
Potassium: 4.3 mmol/L (ref 3.5–5.1)
Sodium: 127 mmol/L — ABNORMAL LOW (ref 135–145)
Total Bilirubin: 0.7 mg/dL (ref 0.3–1.2)
Total Protein: 6.5 g/dL (ref 6.5–8.1)

## 2021-07-04 LAB — PHOSPHORUS: Phosphorus: 1.4 mg/dL — ABNORMAL LOW (ref 2.5–4.6)

## 2021-07-04 LAB — D-DIMER, QUANTITATIVE: D-Dimer, Quant: 0.86 ug{FEU}/mL — ABNORMAL HIGH (ref 0.00–0.50)

## 2021-07-04 LAB — MAGNESIUM: Magnesium: 2.3 mg/dL (ref 1.7–2.4)

## 2021-07-04 LAB — C-REACTIVE PROTEIN: CRP: 9 mg/dL — ABNORMAL HIGH (ref <1.0)

## 2021-07-04 MED ORDER — ZINC SULFATE 220 (50 ZN) MG PO CAPS: 220.0000 mg | ORAL_CAPSULE | Freq: Every day | ORAL | 0 refills | Status: DC

## 2021-07-04 MED ORDER — K PHOS MONO-SOD PHOS DI & MONO 155-852-130 MG PO TABS
500.0000 mg | ORAL_TABLET | ORAL | Status: AC
  Administered 2021-07-04: 17:00:00 500 mg via ORAL
  Filled 2021-07-04 (×2): qty 2

## 2021-07-04 MED ORDER — AMLODIPINE BESYLATE 2.5 MG PO TABS: 2.5000 mg | ORAL_TABLET | Freq: Every day | ORAL | 0 refills | Status: DC

## 2021-07-04 MED ORDER — PREDNISONE 10 MG PO TABS: ORAL_TABLET | ORAL | 0 refills | Status: DC

## 2021-07-04 MED ORDER — ALBUTEROL SULFATE HFA 108 (90 BASE) MCG/ACT IN AERS
2.0000 | INHALATION_SPRAY | Freq: Four times a day (QID) | RESPIRATORY_TRACT | 0 refills | Status: DC
Start: 2021-07-04 — End: 2021-10-10

## 2021-07-04 MED ORDER — ASCORBIC ACID 500 MG PO TABS: 500.0000 mg | ORAL_TABLET | Freq: Every day | ORAL | 0 refills | Status: DC

## 2021-07-04 MED ORDER — POTASSIUM PHOSPHATES 15 MMOLE/5ML IV SOLN
30.0000 mmol | Freq: Once | INTRAVENOUS | Status: AC
  Administered 2021-07-04: 13:00:00 30 mmol via INTRAVENOUS
  Filled 2021-07-04: qty 10

## 2021-07-04 NOTE — ED Notes (Addendum)
Pharmacy called at this time to dispense remdesivir.

## 2021-07-04 NOTE — ED Notes (Signed)
Pt awake in bed requesting a cup of ice, pulse ox put on pt, vss. Pt denies other needs.

## 2021-07-04 NOTE — Discharge Summary (Signed)
[facility]   PATIENT NAME: Kaylee Santiago    MR#:  [id number]  DATE OF BIRTH:  06-27-1935  DATE OF ADMISSION:  07/01/2021 ADMITTING PHYSICIAN: Andris Baumann, MD  DATE OF DISCHARGE: 07/04/2021  PRIMARY CARE PHYSICIAN: Lynnea Ferrier, MD    ADMISSION DIAGNOSIS:  Acute hyponatremia [E87.1]  DISCHARGE DIAGNOSIS:  COVID-19 pneumonia COPD exacerbation Acute metabolic encephalopathy Hyponatremia secondary to SIADH Paroxysmal atrial fibrillation Hypophosphatemia Diarrhea Hypothyroidism unspecified  SECONDARY DIAGNOSIS:   Past Medical History:  Diagnosis Date   Atrial fibrillation (HCC)    Atrial fibrillation (HCC)    Back pain    Dysrhythmia    Hypertension    Hypothyroidism     HOSPITAL COURSE:   COVID-19 pneumonia, COPD exacerbation.  Patient received 4 days of remdesivir and received Solu-Medrol.  Patient is feeling better and is off oxygen at this point.  She wants to go home since its Christmas. Acute metabolic encephalopathy.  Patient feels her mental status is much improved.  Likely from hyponatremia and COVID-19 infection. Hyponatremia secondary to SIADH.  Urine sodium very high.  Continue fluid restriction.  Sodium 121 on presentation and up to 127 upon disposition.  Continue fluid restriction.  Reviewed CT scan from last year of the chest and CT scan of the abdomen pelvis from a few months ago.  Recommend checking a BMP as outpatient.  Hesitant on starting salt pills at this time.  Paroxysmal atrial fibrillation on metoprolol for rate control and Xarelto for anticoagulation Essential hypertension.  Continue metoprolol and low-dose Norvasc. Diarrhea likely secondary to COVID infection.  Patient states that she did not have any diarrhea today Hypothyroidism unspecified on levothyroxine Hypophosphatemia.  Patient complained that the IV K-Phos was burning.  I will give an oral phosphorus supplementation prior to  disposition.  DISCHARGE CONDITIONS:   Satisfactory  CONSULTS OBTAINED:  None  DRUG ALLERGIES:   Allergies  Allergen Reactions   Amoxicillin-Pot Clavulanate Diarrhea   Levofloxacin Diarrhea   Trazodone Other (See Comments)    nightmares    DISCHARGE MEDICATIONS:   Allergies as of 07/04/2021       Reactions   Amoxicillin-pot Clavulanate Diarrhea   Levofloxacin Diarrhea   Trazodone Other (See Comments)   nightmares        Medication List     TAKE these medications    albuterol 108 (90 Base) MCG/ACT inhaler Commonly known as: VENTOLIN HFA Inhale 2 puffs into the lungs every 6 (six) hours.   amLODipine 2.5 MG tablet Commonly known as: NORVASC Take 1 tablet (2.5 mg total) by mouth daily.   ascorbic acid 500 MG tablet Commonly known as: VITAMIN C Take 1 tablet (500 mg total) by mouth daily. Start taking on: July 05, 2021   Biotin 5000 MCG Tabs Take 5,000 mcg by mouth daily.   CALTRATE 600+D3 PO Take 1 tablet by mouth daily.   Lagevrio 200 MG Caps capsule Generic drug: molnupiravir EUA Take 4 capsules by mouth 2 (two) times daily.   levothyroxine 75 MCG tablet Commonly known as: SYNTHROID Take 1 tablet by mouth daily.   LORazepam 0.5 MG tablet Commonly known as: ATIVAN Take 0.5 mg by mouth at bedtime as needed for sleep.   metoprolol succinate 50 MG 24 hr tablet Commonly known as: TOPROL-XL Take 50 mg by mouth in the morning and at bedtime. Take 50 mg in the morning and 25 mg in the evening   MULTI-VITAMIN DAILY PO Take 1 tablet by  mouth daily.   predniSONE 10 MG tablet Commonly known as: DELTASONE 4 tabs po daily for three days Start taking on: July 05, 2021   Probiotic 250 MG Caps Take 1 capsule by mouth daily.   vitamin E 180 MG (400 UNITS) capsule Take 400 Units by mouth daily.   Xarelto 20 MG Tabs tablet Generic drug: rivaroxaban Take 20 mg by mouth daily.   zinc sulfate 220 (50 Zn) MG capsule Take 1 capsule (220 mg  total) by mouth daily. Start taking on: July 05, 2021         DISCHARGE INSTRUCTIONS:  Follow-up PMD 5 days  If you experience worsening of your admission symptoms, develop shortness of breath, life threatening emergency, suicidal or homicidal thoughts you must seek medical attention immediately by calling 911 or calling your MD immediately  if symptoms less severe.  You Must read complete instructions/literature along with all the possible adverse reactions/side effects for all the Medicines you take and that have been prescribed to you. Take any new Medicines after you have completely understood and accept all the possible adverse reactions/side effects.   Please note  You were cared for by a hospitalist during your hospital stay. If you have any questions about your discharge medications or the care you received while you were in the hospital after you are discharged, you can call the unit and asked to speak with the hospitalist on call if the hospitalist that took care of you is not available. Once you are discharged, your primary care physician will handle any further medical issues. Please note that NO REFILLS for any discharge medications will be authorized once you are discharged, as it is imperative that you return to your primary care physician (or establish a relationship with a primary care physician if you do not have one) for your aftercare needs so that they can reassess your need for medications and monitor your lab values.    Today   CHIEF COMPLAINT:   Chief Complaint  Patient presents with   Altered Mental Status   Weakness    HISTORY OF PRESENT ILLNESS:  Kaylee Santiago  is a 86 y.o. female came in with altered mental status and weakness and found to have COVID-19 infection   VITAL SIGNS:  Blood pressure 124/71, pulse 87, temperature (!) 97.1 F (36.2 C), temperature source Oral, resp. rate 17, SpO2 95 %.    PHYSICAL EXAMINATION:  GENERAL:  86 y.o.-year-old  patient lying in the bed with no acute distress.  EYES: Pupils equal, round, reactive to light and accommodation. No scleral icterus.  HEENT: Head atraumatic, normocephalic. Oropharynx and nasopharynx clear.  LUNGS: Normal breath sounds bilaterally, no wheezing, rales,rhonchi or crepitation. No use of accessory muscles of respiration.  CARDIOVASCULAR: S1, S2 normal. No murmurs, rubs, or gallops.  ABDOMEN: Soft, non-tender, non-distended.  EXTREMITIES: No pedal edema.  NEUROLOGIC: Cranial nerves II through XII are intact. Muscle strength 5/5 in all extremities. Sensation intact. Gait not checked.  PSYCHIATRIC: The patient is alert and oriented x 3.  SKIN: No obvious rash, lesion, or ulcer.   DATA REVIEW:   CBC Recent Labs  Lab 07/04/21 0726  WBC 12.4*  HGB 12.1  HCT 34.7*  PLT 246    Chemistries  Recent Labs  Lab 07/04/21 0726  NA 127*  K 4.3  CL 91*  CO2 32  GLUCOSE 137*  BUN 14  CREATININE 0.46  CALCIUM 7.7*  MG 2.3  AST 53*  ALT 43  ALKPHOS 113  BILITOT 0.7     Microbiology Results  Results for orders placed or performed during the hospital encounter of 07/01/21  Resp Panel by RT-PCR (Flu A&B, Covid) Nasopharyngeal Swab     Status: Abnormal   Collection Time: 07/01/21  9:55 PM   Specimen: Nasopharyngeal Swab; Nasopharyngeal(NP) swabs in vial transport medium  Result Value Ref Range Status   SARS Coronavirus 2 by RT PCR POSITIVE (A) NEGATIVE Final    Comment: (NOTE) SARS-CoV-2 target nucleic acids are DETECTED.  The SARS-CoV-2 RNA is generally detectable in upper respiratory specimens during the acute phase of infection. Positive results are indicative of the presence of the identified virus, but do not rule out bacterial infection or co-infection with other pathogens not detected by the test. Clinical correlation with patient history and other diagnostic information is necessary to determine patient infection status. The expected result is  Negative.  Fact Sheet for Patients: BloggerCourse.com  Fact Sheet for Healthcare Providers: SeriousBroker.it  This test is not yet approved or cleared by the [place] FDA and  has been authorized for detection and/or diagnosis of SARS-CoV-2 by FDA under an Emergency Use Authorization (EUA).  This EUA will remain in effect (meaning this test can be used) for the duration of  the COVID-19 declaration under Section 564(b)(1) of the A ct, 21 U.S.C. section 360bbb-3(b)(1), unless the authorization is terminated or revoked sooner.     Influenza A by PCR NEGATIVE NEGATIVE Final   Influenza B by PCR NEGATIVE NEGATIVE Final    Comment: (NOTE) The Xpert Xpress SARS-CoV-2/FLU/RSV plus assay is intended as an aid in the diagnosis of influenza from Nasopharyngeal swab specimens and should not be used as a sole basis for treatment. Nasal washings and aspirates are unacceptable for Xpert Xpress SARS-CoV-2/FLU/RSV testing.  Fact Sheet for Patients: BloggerCourse.com  Fact Sheet for Healthcare Providers: SeriousBroker.it  This test is not yet approved or cleared by the [place] FDA and has been authorized for detection and/or diagnosis of SARS-CoV-2 by FDA under an Emergency Use Authorization (EUA). This EUA will remain in effect (meaning this test can be used) for the duration of the COVID-19 declaration under Section 564(b)(1) of the Act, 21 U.S.C. section 360bbb-3(b)(1), unless the authorization is terminated or revoked.  Performed at [facility], [address] [place], [place] [address]       Management plans discussed with the patient, family and they are in agreement.  CODE STATUS:     Code Status Orders  (From admission, onward)           Start     Ordered   07/02/21 0001  Full code  Continuous        07/02/21 0001           Code Status  History     Date Active Date Inactive Code Status Order ID Comments User Context   04/09/2021 0906 04/11/2021 2343 Full Code [id number]  Lucile Shutters, MD ED   12/21/2018 1132 12/21/2018 1625 Full Code [id number]  Kennedy Bucker, MD Inpatient   06/29/2018 2140 07/01/2018 1826 Full Code [id number]  Enid Baas, MD ED       TOTAL TIME TAKING CARE OF THIS PATIENT: 34 minutes.    Alford Highland M.D on 07/04/2021 at 3:54 PM    [facility]  CC: Primary care physician; Lynnea Ferrier, MD

## 2021-07-04 NOTE — ED Notes (Signed)
Informed RN bed assigned 

## 2021-07-04 NOTE — ED Notes (Signed)
Pt eating meal tray 

## 2021-07-20 ENCOUNTER — Ambulatory Visit: Admission: RE | Admit: 2021-07-20 | Payer: Medicare Other | Source: Home / Self Care

## 2021-07-20 ENCOUNTER — Encounter: Admission: RE | Payer: Self-pay | Source: Home / Self Care

## 2021-07-20 SURGERY — COLONOSCOPY WITH PROPOFOL
Anesthesia: General

## 2021-09-02 ENCOUNTER — Other Ambulatory Visit: Payer: Self-pay

## 2021-09-02 ENCOUNTER — Ambulatory Visit: Payer: Medicare Other | Attending: Family Medicine | Admitting: Physical Therapy

## 2021-09-02 ENCOUNTER — Encounter: Payer: Self-pay | Admitting: Physical Therapy

## 2021-09-02 DIAGNOSIS — G8929 Other chronic pain: Secondary | ICD-10-CM | POA: Insufficient documentation

## 2021-09-02 DIAGNOSIS — M546 Pain in thoracic spine: Secondary | ICD-10-CM | POA: Insufficient documentation

## 2021-09-02 DIAGNOSIS — M545 Low back pain, unspecified: Secondary | ICD-10-CM | POA: Insufficient documentation

## 2021-09-02 NOTE — Therapy (Signed)
[place] [facility] PHYSICAL AND SPORTS MEDICINE [address] [place], [address] Phone: [phone]   Fax:  [phone]  Physical Therapy Evaluation  Patient Details  Name: Kaylee Santiago MRN: [id number] Date of Birth: [birth year] Referring Provider (PT): Burman Freestone, NP   Encounter Date: 09/02/2021   PT End of Session - 09/03/21 1320     Visit Number 1    Number of Visits 24    Date for PT Re-Evaluation 11/25/21    Authorization Type MEDICARE PART B reporting period from 09/02/2021    Progress Note Due on Visit 10    PT Start Time 1435    PT Stop Time 1515    PT Time Calculation (min) 40 min    Activity Tolerance Patient tolerated treatment well    Behavior During Therapy [facility] for tasks assessed/performed             Past Medical History:  Diagnosis Date   Atrial fibrillation (HCC)    Atrial fibrillation (HCC)    Back pain    Dysrhythmia    Hypertension    Hypothyroidism     Past Surgical History:  Procedure Laterality Date   ABDOMINAL HYSTERECTOMY     BREAST EXCISIONAL BIOPSY Right 70's   CATARACT EXTRACTION W/ INTRAOCULAR LENS  IMPLANT, BILATERAL     KYPHOPLASTY N/A 12/21/2018   Procedure: KYPHOPLASTY L1;  Surgeon: Kennedy Bucker, MD;  Location: [facility] ORS;  Service: Orthopedics;  Laterality: N/A;    There were no vitals filed for this visit.    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 She got her left foot caught and it fractured her back when she fell. She was out of work for 3-4 months. She just got pain medications and she laid on the couch. She had a little PT and recovered and went back to work. Over the years it has hurt more or less. Last winter she fell in the snow resulting in back hurting then improving, but then in a few weeks the pain came back. Off and on since then she has had a little issue. She  went in to see Dr. Yves Dill and they put a gel in surgically (chart shows kyphoplasty at L1 by Dr. Rosita Kea on 12/21/2018). She just watches what she does  (states she does not climb trees anymore) but it hurts to squat. She has some Vicodin that she tries not to take. Her back has been bothering her and she saw Burman Freestone, NP who said it was nothing worse than arthritis and referred her to PT. Pain does not wake her. She does a lot of arm circle exercises and squats each day. She can do a hula hoop still. She lost her husband last year.    Pertinent History Patient is a 87 y.o. female who presents to outpatient physical therapy with a referral for medical diagnosis degenerative disc disease, chronic bilateral low back pain without sciatica,  mid back pain. This patient's chief complaints consist of chronic low back and lower thoracic spine pain leading to the following functional deficits: difficulty squatting, prolonged sitting, getting up from squat, getting up and down from the floor, almost anything where she has to go down to the floor, going out with friends, yardwork, housework, church, cooking, walking, social life, shopping.  Relevant past medical history and comorbidities include osteoporosis, afib (takes anticoagulant), hypothyroidism,  HTN, COPD, kyphoplasty at L1 by Dr. Rosita KeaMenz on 12/21/2018. Patient denies hx of cancer, stroke, seizures, major cardiac events (except afib), diabetes, unexplained weight loss, changes in bowel or bladder problems, new onset stumbling or dropping things, spinal surgery except kyphoplasty at L1.    Limitations Sitting;House hold activities;Lifting   squatting, prolonged sitting, getting up from squat, getting up and down from the floor, almost anything where she has to go down to the floor, going out with friends, yardwork, housework, church, cooking, walking, social life, shopping.   Diagnostic tests Lumbar MRI report 01/14/2021: "IMPRESSION:  1. Chronic compression fractures  of L1 and L2 with approximately 70%  central height loss and 4 mm of retropulsion. Status post L1  vertebral augmentation.  2. Unchanged multilevel mild-to-moderate neural foraminal stenosis,  worst at right L4-5.  3. No spinal canal stenosis."    Patient Stated Goals be able to do things and her back not hurt without taking medications    Currently in Pain? Yes    Pain Score 3    W: 8/10; B: 0/10   Pain Location Back    Pain Orientation --   over TL junction and down over sacrum   Pain Descriptors / Indicators --   soreness   Pain Type Chronic pain    Pain Radiating Towards sacrum. Denies pain in LEs or numbness or tingling.    Pain Onset More than a month ago    Pain Frequency Intermittent    Aggravating Factors  squatting, prolonged sitting, getting up from squat, getting up and down from the floor, almost anything where she has to go down to the floor    Pain Relieving Factors heat, pain medications, walking    Effect of Pain on Daily Activities Functional Limitations: squatting, prolonged sitting, getting up from squat, getting up and down from the floor, almost anything where she has to go down to the floor, going out with friends, yardwork, housework, church, cooking, walking, social life, shopping.                [facility] - 09/03/21 0001       Assessment   Medical Diagnosis degenerative disc disease, chronic bilateral low back pain without sciatica,  mid back pain    Referring Provider (PT) Burman FreestoneWhitney Meeler, NP    Onset Date/Surgical Date --   years ago   Hand Dominance Left    Prior Therapy years ago and she cannot remember if it helped or not      Precautions   Precautions Fall   squat instead of bend     Balance Screen   Has the patient fallen in the past 6 months No    Has the patient had a decrease in activity level because of a fear of falling?  Yes   more cautious   Is the patient reluctant to leave their home because of a fear of falling?  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  Anthropometrics Tremor: none Body composition: underweight Muscle bulk: decreased consistent with scarcopenia throughout body Edema: none Functional Mobility Bed mobility: supine <> sit and rolling I Transfers: sit <> stand I Gait: grossly WFL for household and short community ambulation. More detailed gait analysis deferred to later date as needed.   SPINE MOTION Lumbar Spine AROM *Indicates pain Flexion: fingers distal 3rd of lower legs. Mild right spinal/rib hump in lumbothoracic regoin.  Extension: 0% stiff and sore Side Flexion:   R mid patella, tight  L mid patella, tight Rotation:  R WFL, stiff L WFL, stiff  NEUROLOGICAL Dermatomes L2-S2 appears equal and intact to light touch except the following:   PERIPHERAL JOINT MOTION (in degrees) Passive Range of Motion (PROM) Comments: B LE WFL  MUSCLE PERFORMANCE (MMT):  *Indicates pain 09/02/21 Date Date  Joint/Motion R/L R/L R/L  Hip     Flexion (L1, L2) 4+/4+ / /  Extension (knee ext) 4/4 / /  Extension (knee flex) / / /  Abduction 5/5 / /  Adduction 5/5 / /  Knee     Extension (L3) 5/5 / /  Flexion (S2) 4+/4+ / /  Ankle/Foot     Dorsiflexion (L4) 4+/4+ / /  Great toe extension (L5) 4/4 / /  Eversion (S1) 5/5 / /  Plantarflexion (S1) 4/4+ / /  Comments:   ACCESSORY MOTION:  Painful to pressure over  sacrum, spine defferred due to osteoporosis.   PALPATION: TTP at bilateral lower thoracic and lumbar paraspinals  FUNCTIONAL TESTS - squat: completes mini squat with good form but reports pain if she goes lower  Objective measurements completed on examination: See above findings.       PT Education - 09/03/21 1319     Education Details Education on diagnosis, prognosis, POC, anatomy and physiology of current condition    Person(s) Educated Patient    Methods Explanation    Comprehension Verbalized understanding;Need further instruction              PT Short Term Goals - 09/03/21 1323       PT SHORT TERM GOAL #1   Title Be independent with initial home exercise program for [place]-management of symptoms.    Baseline Initial HEP to be provided at visit 2 as appropriate (09/02/2021);    Time 2    Period Weeks    Status New    Target Date 09/17/21               PT Long Term Goals - 09/03/21 1324       PT LONG TERM GOAL #1   Title Be independent with a long-term home exercise program for [place]-management of symptoms.    Baseline Initial HEP to be provided visit 2 as appropriate (09/02/2021);    Time 12    Period Weeks    Status New   TARGET DATE FOR ALL LONG TERM GOALS: 11/25/2021     PT LONG TERM GOAL #2   Title Demonstrate improved FOTO score by 10 units to demonstrate improvement in overall condition and [place]-reported functional ability.    Baseline 63 (09/02/2021);    Time 12    Period Weeks    Status New      PT LONG TERM GOAL #3   Title Be able to squat to chair height 1x10 reps with proper form without limitation  due to current condition in order to improve ability to lift and complete transfers during usual activities such as house work, getting up after sitting, etc.    Baseline ompletes mini squat with good form but reports pain if she goes lower (09/02/21);    Time 12    Period Weeks    Status New      PT LONG TERM GOAL #4   Title Reduce pain with  functional activities to equal or less than 1/10 to allow patient to complete usual activities including housework, walking, bending with less difficulty.    Baseline up to 8/10 (09/02/2021);    Time 12    Period Weeks    Status New      PT LONG TERM GOAL #5   Title Complete community, work and/or recreational activities without limitation due to current condition.    Baseline Functional Limitations: squatting, prolonged sitting, getting up from squat, getting up and down from the floor, almost anything where she has to go down to the floor, going out with friends, yardwork, housework, church, cooking, walking, social life, shopping (09/02/2021);    Time 12    Period Weeks    Status New                    Plan - 09/03/21 1331     Clinical Impression Statement Patient is a 87 y.o. female referred to outpatient physical therapy with a medical diagnosis of  degenerative disc disease, chronic bilateral low back pain without sciatica, mid back pain who presents with signs and symptoms consistent with chronic low back and lower thoracic spine pain. Patient presents with significant pain, ROM, posture, muscle performance (strength/power/endurance) and activity tolerance impairments that are limiting ability to complete her usual activities such as  squatting, prolonged sitting, getting up from squat, getting up and down from the floor, almost anything where she has to go down to the floor, going out with friends, yardwork, housework, church, cooking, walking, social life, shopping without difficulty. Patient will benefit from skilled physical therapy intervention to address current body structure impairments and activity limitations to improve function and work towards goals set in current POC in order to return to prior level of function or maximal functional improvement.    Personal Factors and Comorbidities Age;Comorbidity 3+;Past/Current Experience;Time since onset of  injury/illness/exacerbation    Comorbidities Relevant past medical history and comorbidities include osteoporosis, afib (takes anticoagulant), hypothyroidism, HTN, COPD, kyphoplasty at L1 by Dr. Rosita Kea on 12/21/2018.    Examination-Activity Limitations Bend;Locomotion Level;Stand;Lift;Squat;Carry;Dressing;Sit;Hygiene/Grooming;Bathing    Examination-Participation Restrictions Yard Work;Interpersonal Relationship;Community Activity;Meal Prep;Cleaning;Shop;Church    Stability/Clinical Decision Making Stable/Uncomplicated    Clinical Decision Making Low    Rehab Potential Good    PT Frequency 2x / week    PT Duration 12 weeks    PT Treatment/Interventions ADLs/[place] [facility] Home Management;Cryotherapy;Moist Heat;Electrical Stimulation;Therapeutic activities;Therapeutic exercise;DME Instruction;Balance training;Neuromuscular re-education;Manual techniques;Dry needling;Patient/family education    PT Next Visit Plan update HEP as appropriate, core, LE,  and functional strengthening, manual therapy as appropriate    PT Home Exercise Plan TBD    Consulted and Agree with Plan of Care Patient             Patient will benefit from skilled therapeutic intervention in order to improve the following deficits and impairments:  Improper body mechanics, Pain, Decreased mobility, Postural dysfunction, Increased muscle spasms, Decreased activity tolerance, Decreased endurance, Decreased range of motion, Decreased strength, Hypomobility, Impaired perceived functional ability, Impaired flexibility  Visit Diagnosis:  Chronic low back pain without sciatica, unspecified back pain laterality  Pain in thoracic spine     Problem List Patient Active Problem List   Diagnosis Date Noted   Hypophosphatemia    COPD with acute exacerbation (HCC)    Hyponatremia 07/01/2021   Bronchopneumonia 07/01/2021   Bronchiectasis (HCC) 07/01/2021   Pneumonia due to COVID-19 virus 07/01/2021   Chronic respiratory failure with  hypoxia (HCC) 07/01/2021   History of epistaxis 07/01/2021   Acute metabolic encephalopathy 07/01/2021   Malnutrition of moderate degree 04/10/2021   Acute blood loss anemia 04/10/2021   Abnormal CT of the abdomen 04/10/2021   Acute on chronic respiratory failure with hypoxia (HCC) 04/09/2021   Epistaxis 04/09/2021   AF (paroxysmal atrial fibrillation) (HCC)    Hypothyroidism    Essential hypertension    Bronchiectasis without complication (HCC) 06/13/2019    Luretha Murphy. Ilsa Iha, PT, DPT 09/03/21, 1:35 PM   [facility] PHYSICAL AND SPORTS MEDICINE [address] [place], [address] Phone: [phone]   Fax:  [phone]  Name: Kaylee Santiago MRN: [id number] Date of Birth: [birth year]

## 2021-09-07 ENCOUNTER — Ambulatory Visit: Payer: Medicare Other | Admitting: Physical Therapy

## 2021-09-08 ENCOUNTER — Telehealth: Payer: Self-pay | Admitting: Physical Therapy

## 2021-09-08 NOTE — Telephone Encounter (Signed)
Called patient when she no-showed to her appointment schedule yesterday at 3:15pm. Patient answered her cell number and stated she was planning to come to her appointment tomorrow at 1:45pm but she was unaware of her appointment yesterday.   Everlean Alstrom. Graylon Good, PT, DPT 09/08/21, 1:11 PM

## 2021-09-09 ENCOUNTER — Encounter: Payer: Self-pay | Admitting: Physical Therapy

## 2021-09-09 ENCOUNTER — Ambulatory Visit: Payer: Medicare Other | Attending: Family Medicine | Admitting: Physical Therapy

## 2021-09-09 ENCOUNTER — Other Ambulatory Visit: Payer: Self-pay

## 2021-09-09 DIAGNOSIS — G8929 Other chronic pain: Secondary | ICD-10-CM | POA: Insufficient documentation

## 2021-09-09 DIAGNOSIS — M546 Pain in thoracic spine: Secondary | ICD-10-CM | POA: Diagnosis present

## 2021-09-09 DIAGNOSIS — M545 Low back pain, unspecified: Secondary | ICD-10-CM | POA: Diagnosis not present

## 2021-09-09 NOTE — Therapy (Signed)
[place] [facility] [address] [place], [address] Phone: [phone]   Fax:  [phone]  Physical Therapy Treatment  Patient Details  Name: Kaylee Santiago MRN: [id number] Date of Birth: April 07, 1935 Referring Provider (PT): Allene Dillon, NP   Encounter Date: 09/09/2021   PT End of Session - 09/09/21 1353     Visit Number 2    Number of Visits 24    Date for PT Re-Evaluation 11/25/21    Authorization Type MEDICARE PART B reporting period from 09/02/2021    Progress Note Due on Visit 10    PT Start Time 1348    PT Stop Time 1426    PT Time Calculation (min) 38 min    Activity Tolerance Patient tolerated treatment well    Behavior During Therapy [facility] for tasks assessed/performed             Past Medical History:  Diagnosis Date   Atrial fibrillation ([place])    Atrial fibrillation ([place])    Back pain    Dysrhythmia    Hypertension    Hypothyroidism     Past Surgical History:  Procedure Laterality Date   ABDOMINAL HYSTERECTOMY     BREAST EXCISIONAL BIOPSY Right 70's   CATARACT EXTRACTION W/ INTRAOCULAR LENS  IMPLANT, BILATERAL     KYPHOPLASTY N/A 12/21/2018   Procedure: KYPHOPLASTY L1;  Surgeon: Hessie Knows, MD;  Location: [facility] ORS;  Service: Orthopedics;  Laterality: N/A;    There were no vitals filed for this visit.   Subjective Assessment - 09/09/21 1347     Subjective Patient reports she feels stiff and sore today. Rates her pain 3/10 in the mid low back/sacrum area. She states she was not sore after her initial eval.    Pertinent History Patient is a 87 y.o. female who presents to outpatient physical therapy with a referral for medical diagnosis degenerative disc disease, chronic bilateral low back pain without sciatica,  mid back pain. This patient's chief complaints consist of chronic low back and lower thoracic spine pain leading to the following functional deficits: difficulty squatting,  prolonged sitting, getting up from squat, getting up and down from the floor, almost anything where she has to go down to the floor, going out with friends, yardwork, housework, church, cooking, walking, social life, shopping.  Relevant past medical history and comorbidities include osteoporosis, afib (takes anticoagulant), hypothyroidism, HTN, COPD, kyphoplasty at L1 by Dr. Rudene Christians on 12/21/2018. Patient denies hx of cancer, stroke, seizures, major cardiac events (except afib), diabetes, unexplained weight loss, changes in bowel or bladder problems, new onset stumbling or dropping things, spinal surgery except kyphoplasty at L1.    Limitations Sitting;House hold activities;Lifting   squatting, prolonged sitting, getting up from squat, getting up and down from the floor, almost anything where she has to go down to the floor, going out with friends, yardwork, housework, church, cooking, walking, social life, shopping.   Diagnostic tests Lumbar MRI report 01/14/2021: "IMPRESSION:  1. Chronic compression fractures of L1 and L2 with approximately 70%  central height loss and 4 mm of retropulsion. Status post L1  vertebral augmentation.  2. Unchanged multilevel mild-to-moderate neural foraminal stenosis,  worst at right L4-5.  3. No spinal canal stenosis."    Patient Stated Goals be able to do things and her back not hurt without taking medications    Currently in Pain? Yes    Pain Score 3  TREATMENT:   Therapeutic exercise: to centralize symptoms and improve ROM, strength, muscular endurance, and activity tolerance required for successful completion of functional activities.  - NuStep level 3 using bilateral upper and lower extremities. Seat/handle setting 11/13. For improved extremity mobility, muscular endurance, and activity tolerance; and to induce the analgesic effect of aerobic exercise, stimulate improved joint nutrition, and prepare body structures and systems for following  interventions. x 5  minutes. Average SPM = 83. (Fatiguing, especially in the arms). - standing mountain climber leaning on TM bar, 5 second holds, 2x10 each side.  - attempted sit <> stand form 18 inch chair with no UE support but unable - Squats with BUE support focusing on glute activation and proper form with hip hinging, stabilized back, and tibial perpendicular to floor with knees behind toes. 2x10 (feels strongly in quads).  - Standing pallof press (multifidus press) with YTB 3x10 each side. Cuing for for and trunk control.  - single leg stance with touchdown UE support, 1x 1 min each side -review of HEP exercises using printout for instruction (patient continued to require additional cuing from PT to complete correctly - would benefit from further instruction next session).   Pt required multimodal cuing for proper technique and to facilitate improved neuromuscular control, strength, range of motion, and functional ability resulting in improved performance and form.  HOME EXERCISE PROGRAM Access Code: [email or web address] URL: [email or web address] Date: 09/09/2021 Prepared by: Norton BlizzardSara Kizer Nobbe  Exercises [facility] Climber on Counter - 3-5 x weekly - 1 sets - 10 reps - 5 seconds hold Squat with Counter Support - 3-5 x weekly - 2 sets - 10 reps Seated Anti-Rotation Press with Anchored Resistance - 3-5 x weekly - 3 sets - 10 reps - 5 seconds hold    PT Education - 09/09/21 1351     Education Details Exercise purpose/form. [place] management techniques. Reviewed cancelation/no-show policy with patient and confirmed patient has correct phone number for clinic; patient verbalized understanding    Person(s) Educated Patient    Methods Explanation;Demonstration;Tactile cues;Verbal cues;Handout    Comprehension Verbalized understanding;Returned demonstration;Verbal cues required;Tactile cues required;Need further instruction              PT Short Term Goals - 09/03/21 1323       PT  SHORT TERM GOAL #1   Title Be independent with initial home exercise program for [place]-management of symptoms.    Baseline Initial HEP to be provided at visit 2 as appropriate (09/02/2021);    Time 2    Period Weeks    Status New    Target Date 09/17/21               PT Long Term Goals - 09/03/21 1324       PT LONG TERM GOAL #1   Title Be independent with a long-term home exercise program for Ferrari-management of symptoms.    Baseline Initial HEP to be provided visit 2 as appropriate (09/02/2021);    Time 12    Period Weeks    Status New   TARGET DATE FOR ALL LONG TERM GOALS: 11/25/2021     PT LONG TERM GOAL #2   Title Demonstrate improved FOTO score by 10 units to demonstrate improvement in overall condition and [place]-reported functional ability.    Baseline 63 (09/02/2021);    Time 12    Period Weeks    Status New      PT LONG TERM GOAL #3   Title Be able to squat to chair height  1x10 reps with proper form without limitation due to current condition in order to improve ability to lift and complete transfers during usual activities such as house work, getting up after sitting, etc.    Baseline ompletes mini squat with good form but reports pain if she goes lower (09/02/21);    Time 12    Period Weeks    Status New      PT LONG TERM GOAL #4   Title Reduce pain with functional activities to equal or less than 1/10 to allow patient to complete usual activities including housework, walking, bending with less difficulty.    Baseline up to 8/10 (09/02/2021);    Time 12    Period Weeks    Status New      PT LONG TERM GOAL #5   Title Complete community, work and/or recreational activities without limitation due to current condition.    Baseline Functional Limitations: squatting, prolonged sitting, getting up from squat, getting up and down from the floor, almost anything where she has to go down to the floor, going out with friends, yardwork, housework, church, cooking, walking, social  life, shopping (09/02/2021);    Time 12    Period Weeks    Status New                   Plan - 09/09/21 1429     Clinical Impression Statement Patient tolerated treatment well overall with no increase in pain by end of session. Session focused on development of initial HEP. Pat[place] required repeated instructions on how to perform exercises correctly  and would benefit from further instruction next session. Patient agreed to discontinue any exercises she feels pain or confuse with at home. Plan to follow up with HEP next session and continue trunk strengthening and balance training as tolerated. Patient kept in upright position today due to difficulty with breathing when horizontal last session. Patient did need some breaks today due to increased work of breathing.  Patient would benefit from continued management of limiting condition by skilled physical therapist to address remaining impairments and functional limitations to work towards stated goals and return to PLOF or maximal functional independence.    Personal Factors and Comorbidities Age;Comorbidity 3+;Past/Current Experience;Time since onset of injury/illness/exacerbation    Comorbidities Relevant past medical history and comorbidities include osteoporosis, afib (takes anticoagulant), hypothyroidism, HTN, COPD, kyphoplasty at L1 by Dr. Rudene Christians on 12/21/2018.    Examination-Activity Limitations Bend;Locomotion Level;Stand;Lift;Squat;Carry;Dressing;Sit;Hygiene/Grooming;Bathing    Examination-Participation Restrictions Yard Work;Interpersonal Relationship;Community Activity;Meal Prep;Cleaning;Shop;Church    Stability/Clinical Decision Making Stable/Uncomplicated    Rehab Potential Good    PT Frequency 2x / week    PT Duration 12 weeks    PT Treatment/Interventions ADLs/[place] [facility] Home Management;Cryotherapy;Moist Heat;Electrical Stimulation;Therapeutic activities;Therapeutic exercise;DME Instruction;Balance training;Neuromuscular  re-education;Manual techniques;Dry needling;Patient/family education    PT Next Visit Plan update HEP as appropriate, core, LE,  and functional strengthening, manual therapy as appropriate    PT Home Exercise Plan Medbridge Access Code: [email or web address]    Consulted and Agree with Plan of Care Patient             Patient will benefit from skilled therapeutic intervention in order to improve the following deficits and impairments:  Improper body mechanics, Pain, Decreased mobility, Postural dysfunction, Increased muscle spasms, Decreased activity tolerance, Decreased endurance, Decreased range of motion, Decreased strength, Hypomobility, Impaired perceived functional ability, Impaired flexibility  Visit Diagnosis: Chronic low back pain without sciatica, unspecified back pain laterality  Pain in thoracic spine  Problem List Patient Active Problem List   Diagnosis Date Noted   Hypophosphatemia    COPD with acute exacerbation ([place])    Hyponatremia 07/01/2021   Bronchopneumonia 07/01/2021   Bronchiectasis ([place]) 07/01/2021   Pneumonia due to COVID-19 virus 07/01/2021   Chronic respiratory failure with hypoxia ([place]) 07/01/2021   History of epistaxis [id number]   Acute metabolic encephalopathy [id number]   Malnutrition of moderate degree 04/10/2021   Acute blood loss anemia 04/10/2021   Abnormal CT of the abdomen 04/10/2021   Acute on chronic respiratory failure with hypoxia ([place]) 04/09/2021   Epistaxis 04/09/2021   AF (paroxysmal atrial fibrillation) ([place])    Hypothyroidism    Essential hypertension    Bronchiectasis without complication ([place]) [id number]    Everlean Alstrom. Graylon Good, PT, DPT 09/09/21, 2:31 PM   [place] [facility] [address] [place], [address] Phone: [phone]   Fax:  [phone]  Name: Kaylee Santiago MRN: [id number] Date of Birth: 08-25-1934

## 2021-09-15 ENCOUNTER — Encounter: Payer: Medicare Other | Admitting: Physical Therapy

## 2021-09-17 ENCOUNTER — Ambulatory Visit: Payer: Medicare Other | Admitting: Physical Therapy

## 2021-09-17 ENCOUNTER — Other Ambulatory Visit: Payer: Self-pay

## 2021-09-17 ENCOUNTER — Encounter: Payer: Self-pay | Admitting: Physical Therapy

## 2021-09-17 DIAGNOSIS — G8929 Other chronic pain: Secondary | ICD-10-CM

## 2021-09-17 DIAGNOSIS — M545 Low back pain, unspecified: Secondary | ICD-10-CM | POA: Diagnosis not present

## 2021-09-17 DIAGNOSIS — M546 Pain in thoracic spine: Secondary | ICD-10-CM

## 2021-09-17 NOTE — Therapy (Signed)
[facility] [address] [place], [place], [address] Phone: [phone]   Fax:  [phone]  Physical Therapy Treatment  Patient Details  Name: Kaylee Santiago MRN: [id number] Date of Birth: [birth year] Referring Provider (PT): Burman FreestoneWhitney Meeler, NP   Encounter Date: 09/17/2021   PT End of Session - 09/17/21 1453     Visit Number 3    Number of Visits 24    Date for PT Re-Evaluation 11/25/21    Authorization Type MEDICARE PART B reporting period from 09/02/2021    Progress Note Due on Visit 10    PT Start Time 1422    PT Stop Time 1502    PT Time Calculation (min) 40 min    Activity Tolerance Patient tolerated treatment well    Behavior During Therapy [facility] for tasks assessed/performed             Past Medical History:  Diagnosis Date   Atrial fibrillation ([facility])    Atrial fibrillation ([facility])    Back pain    Dysrhythmia    Hypertension    Hypothyroidism     Past Surgical History:  Procedure Laterality Date   ABDOMINAL HYSTERECTOMY     BREAST EXCISIONAL BIOPSY Right 70's   CATARACT EXTRACTION W/ INTRAOCULAR LENS  IMPLANT, BILATERAL     KYPHOPLASTY N/A 12/21/2018   Procedure: KYPHOPLASTY L1;  Surgeon: Kennedy BuckerMenz, Michael, MD;  Location: [facility] ORS;  Service: Orthopedics;  Laterality: N/A;    There were no vitals filed for this visit.   Subjective Assessment - 09/17/21 1426     Subjective Patient reports she is feeling well today. She did a lot of cleaning out shelves today and has 2/10 pain/soreness in her upper to mid back. She states it is hard to describe how she felt after last PT session and it was kind of sore but she felt fine by the time she got done having dinner with her friend after the session. She states she did some of her HEP two days but "not like I should." She states her breathing has been okay but she forgot to take her allergy pill today. She has no plans after today's PT session.    Pertinent  History Patient is a 87 y.o. female who presents to outpatient physical therapy with a referral for medical diagnosis degenerative disc disease, chronic bilateral low back pain without sciatica,  mid back pain. This patient's chief complaints consist of chronic low back and lower thoracic spine pain leading to the following functional deficits: difficulty squatting, prolonged sitting, getting up from squat, getting up and down from the floor, almost anything where she has to go down to the floor, going out with friends, yardwork, housework, church, cooking, walking, social life, shopping.  Relevant past medical history and comorbidities include osteoporosis, afib (takes anticoagulant), hypothyroidism, HTN, COPD, kyphoplasty at L1 by Dr. Rosita KeaMenz on 12/21/2018. Patient denies hx of cancer, stroke, seizures, major cardiac events (except afib), diabetes, unexplained weight loss, changes in bowel or bladder problems, new onset stumbling or dropping things, spinal surgery except kyphoplasty at L1.    Limitations Sitting;House hold activities;Lifting   squatting, prolonged sitting, getting up from squat, getting up and down from the floor, almost anything where she has to go down to the floor, going out with friends, yardwork, housework, church, cooking, walking, social life, shopping.   Diagnostic tests Lumbar MRI report 01/14/2021: "IMPRESSION:  1. Chronic compression fractures of L1 and L2 with approximately 70%  central height loss and 4 mm of retropulsion. Status post L1  vertebral augmentation.  2. Unchanged multilevel mild-to-moderate neural foraminal stenosis,  worst at right L4-5.  3. No spinal canal stenosis."    Patient Stated Goals be able to do things and her back not hurt without taking medications    Currently in Pain? Yes    Pain Score 2               TREATMENT:    Therapeutic exercise: to centralize symptoms and improve ROM, strength, muscular endurance, and activity tolerance required for  successful completion of functional activities.  - NuStep level 4 using bilateral upper and lower extremities. Seat/handle setting 11/13. For improved extremity mobility, muscular endurance, and activity tolerance; and to induce the analgesic effect of aerobic exercise, stimulate improved joint nutrition, and prepare body structures and systems for following interventions. x 5  minutes. Average SPM = 73.  - standing mountain climber leaning on TM bar, 1 second holds, 1x10 each side. Needs extensive cuing, keeps trying to do push ups.  - Squats with BUE support focusing on glute activation and proper form with hip hinging, stabilized back, and tibial perpendicular to floor with knees behind toes. 3x10, needs moderate cuing.  - Standing pallof press (multifidus press) with YTB 1x10 each side. Cuing for for and trunk control. Needs moderate cuing.  - hooklying abdominal brace with alternating LE extension, 3x10  each side - standing trunk rotation holding YTB with both hands, 2x10 each side   Pt required multimodal cuing for proper technique and to facilitate improved neuromuscular control, strength, range of motion, and functional ability resulting in improved performance and form.   HOME EXERCISE PROGRAM Access Code: [email or web address] URL: [email or web address] Date: 09/17/2021 Prepared by: Norton Blizzard  Exercises Supine Transversus Abdominis Bracing with Leg Extension - 3-5 x weekly - 3 sets - 10 reps Squat with Counter Support - 3-5 x weekly - 3 sets - 10 reps Standing Trunk Rotation with Resistance - 3-5 x weekly - 3 sets - 10 reps    PT Education - 09/17/21 1504     Education Details Exercise purpose/form. [place] management techniques    Person(s) Educated Patient    Methods Explanation;Demonstration;Tactile cues;Verbal cues    Comprehension Verbalized understanding;Returned demonstration;Verbal cues required;Tactile cues required;Need further instruction              PT Short  Term Goals - 09/03/21 1323       PT SHORT TERM GOAL #1   Title Be independent with initial home exercise program for [place]-management of symptoms.    Baseline Initial HEP to be provided at visit 2 as appropriate (09/02/2021);    Time 2    Period Weeks    Status New    Target Date 09/17/21               PT Long Term Goals - 09/03/21 1324       PT LONG TERM GOAL #1   Title Be independent with a long-term home exercise program for [place]-management of symptoms.    Baseline Initial HEP to be provided visit 2 as appropriate (09/02/2021);    Time 12    Period Weeks    Status New   TARGET DATE FOR ALL LONG TERM GOALS: 11/25/2021     PT LONG TERM GOAL #2   Title Demonstrate improved FOTO score by 10 units to demonstrate improvement in overall condition and [place]ert-reported functional ability.    Baseline 63 (09/02/2021);  Time 12    Period Weeks    Status New      PT LONG TERM GOAL #3   Title Be able to squat to chair height 1x10 reps with proper form without limitation due to current condition in order to improve ability to lift and complete transfers during usual activities such as house work, getting up after sitting, etc.    Baseline ompletes mini squat with good form but reports pain if she goes lower (09/02/21);    Time 12    Period Weeks    Status New      PT LONG TERM GOAL #4   Title Reduce pain with functional activities to equal or less than 1/10 to allow patient to complete usual activities including housework, walking, bending with less difficulty.    Baseline up to 8/10 (09/02/2021);    Time 12    Period Weeks    Status New      PT LONG TERM GOAL #5   Title Complete community, work and/or recreational activities without limitation due to current condition.    Baseline Functional Limitations: squatting, prolonged sitting, getting up from squat, getting up and down from the floor, almost anything where she has to go down to the floor, going out with friends, yardwork,  housework, church, cooking, walking, social life, shopping (09/02/2021);    Time 12    Period Weeks    Status New                   Plan - 09/17/21 1503     Clinical Impression Statement Patient tolerated treatment well overall with reports of fatigue by the end of the session. She continues to be challenged by following HEP instructions without moderate to heavy cuing. Exercises in HEP were modified to improve her ability to perform the exercises effectively. Would benefit from review and reinforcement at future sessions. She did complete one exercise on her back, which caused coughing that she was able to tolerate. Plan to continue with core and functional strengthening exercises next session as tolerated. Patient would benefit from continued management of limiting condition by skilled physical therapist to address remaining impairments and functional limitations to work towards stated goals and return to PLOF or maximal functional independence.    Personal Factors and Comorbidities Age;Comorbidity 3+;Past/Current Experience;Time since onset of injury/illness/exacerbation    Comorbidities Relevant past medical history and comorbidities include osteoporosis, afib (takes anticoagulant), hypothyroidism, HTN, COPD, kyphoplasty at L1 by Dr. Rosita Kea on 12/21/2018.    Examination-Activity Limitations Bend;Locomotion Level;Stand;Lift;Squat;Carry;Dressing;Sit;Hygiene/Grooming;Bathing    Examination-Participation Restrictions Yard Work;Interpersonal Relationship;Community Activity;Meal Prep;Cleaning;Shop;Church    Stability/Clinical Decision Making Stable/Uncomplicated    Rehab Potential Good    PT Frequency 2x / week    PT Duration 12 weeks    PT Treatment/Interventions ADLs/[place] Care Home Management;Cryotherapy;Moist Heat;Electrical Stimulation;Therapeutic activities;Therapeutic exercise;DME Instruction;Balance training;Neuromuscular re-education;Manual techniques;Dry needling;Patient/family  education    PT Next Visit Plan update HEP as appropriate, core, LE,  and functional strengthening, manual therapy as appropriate    PT Home Exercise Plan Medbridge Access Code: [email or web address]    Consulted and Agree with Plan of Care Patient             Patient will benefit from skilled therapeutic intervention in order to improve the following deficits and impairments:  Improper body mechanics, Pain, Decreased mobility, Postural dysfunction, Increased muscle spasms, Decreased activity tolerance, Decreased endurance, Decreased range of motion, Decreased strength, Hypomobility, Impaired perceived functional ability, Impaired flexibility  Visit Diagnosis: Chronic low back  pain without sciatica, unspecified back pain laterality  Pain in thoracic spine     Problem List Patient Active Problem List   Diagnosis Date Noted   Hypophosphatemia    COPD with acute exacerbation ([facility])    Hyponatremia 07/01/2021   Bronchopneumonia 07/01/2021   Bronchiectasis ([facility]) 07/01/2021   Pneumonia due to COVID-19 virus 07/01/2021   Chronic respiratory failure with hypoxia ([facility]) 07/01/2021   History of epistaxis 07/01/2021   Acute metabolic encephalopathy 07/01/2021   Malnutrition of moderate degree 04/10/2021   Acute blood loss anemia 04/10/2021   Abnormal CT of the abdomen 04/10/2021   Acute on chronic respiratory failure with hypoxia ([facility]) 04/09/2021   Epistaxis 04/09/2021   AF (paroxysmal atrial fibrillation) ([facility])    Hypothyroidism    Essential hypertension    Bronchiectasis without complication ([facility]) 06/13/2019    Luretha Murphy. Ilsa Iha, PT, DPT 09/17/21, 3:07 PM   [facility] [facility] [address] [place], [address] Phone: [phone]   Fax:  [phone]  Name: Kaylee Santiago MRN: [id number] Date of Birth: 1934-11-02

## 2021-09-19 ENCOUNTER — Emergency Department: Payer: Medicare Other

## 2021-09-19 ENCOUNTER — Other Ambulatory Visit: Payer: Self-pay

## 2021-09-19 ENCOUNTER — Emergency Department
Admission: EM | Admit: 2021-09-19 | Discharge: 2021-09-19 | Disposition: A | Discharge status: Home / Self Care | Payer: Medicare Other | Attending: Emergency Medicine | Admitting: Emergency Medicine

## 2021-09-19 DIAGNOSIS — J449 Chronic obstructive pulmonary disease, unspecified: Secondary | ICD-10-CM | POA: Diagnosis not present

## 2021-09-19 DIAGNOSIS — Z79899 Other long term (current) drug therapy: Secondary | ICD-10-CM | POA: Diagnosis not present

## 2021-09-19 DIAGNOSIS — E039 Hypothyroidism, unspecified: Secondary | ICD-10-CM | POA: Insufficient documentation

## 2021-09-19 DIAGNOSIS — Z20822 Contact with and (suspected) exposure to covid-19: Secondary | ICD-10-CM | POA: Insufficient documentation

## 2021-09-19 DIAGNOSIS — Z7901 Long term (current) use of anticoagulants: Secondary | ICD-10-CM | POA: Diagnosis not present

## 2021-09-19 DIAGNOSIS — I10 Essential (primary) hypertension: Secondary | ICD-10-CM | POA: Diagnosis not present

## 2021-09-19 DIAGNOSIS — R519 Headache, unspecified: Secondary | ICD-10-CM | POA: Insufficient documentation

## 2021-09-19 DIAGNOSIS — Z8616 Personal history of COVID-19: Secondary | ICD-10-CM | POA: Diagnosis not present

## 2021-09-19 DIAGNOSIS — R4182 Altered mental status, unspecified: Secondary | ICD-10-CM | POA: Diagnosis not present

## 2021-09-19 LAB — TROPONIN I (HIGH SENSITIVITY)
Troponin I (High Sensitivity): 9 ng/L (ref <18)
Troponin I (High Sensitivity): 11 ng/L (ref <18)

## 2021-09-19 LAB — CBC WITH DIFFERENTIAL/PLATELET
Abs Immature Granulocytes: 0.03 10*3/uL (ref 0.00–0.07)
Basophils Absolute: 0 10*3/uL (ref 0.0–0.1)
Basophils Relative: 0 %
Eosinophils Absolute: 0.1 10*3/uL (ref 0.0–0.5)
Eosinophils Relative: 1 %
HCT: 41.3 % (ref 36.0–46.0)
Hemoglobin: 13.3 g/dL (ref 12.0–15.0)
Immature Granulocytes: 0 %
Lymphocytes Relative: 8 %
Lymphs Abs: 0.7 10*3/uL (ref 0.7–4.0)
MCH: 30 pg (ref 26.0–34.0)
MCHC: 32.2 g/dL (ref 30.0–36.0)
MCV: 93.2 fL (ref 80.0–100.0)
Monocytes Absolute: 0.6 10*3/uL (ref 0.1–1.0)
Monocytes Relative: 7 %
Neutro Abs: 7.6 10*3/uL (ref 1.7–7.7)
Neutrophils Relative %: 84 %
Platelets: 265 10*3/uL (ref 150–400)
RBC: 4.43 MIL/uL (ref 3.87–5.11)
RDW: 13.6 % (ref 11.5–15.5)
WBC: 9.1 10*3/uL (ref 4.0–10.5)
nRBC: 0 % (ref 0.0–0.2)

## 2021-09-19 LAB — PHOSPHORUS: Phosphorus: 3.7 mg/dL (ref 2.5–4.6)

## 2021-09-19 LAB — COMPREHENSIVE METABOLIC PANEL
ALT: 31 U/L (ref 0–44)
AST: 38 U/L (ref 15–41)
Albumin: 4.1 g/dL (ref 3.5–5.0)
Alkaline Phosphatase: 57 U/L (ref 38–126)
Anion gap: 9 (ref 5–15)
BUN: 20 mg/dL (ref 8–23)
CO2: 30 mmol/L (ref 22–32)
Calcium: 9.5 mg/dL (ref 8.9–10.3)
Chloride: 94 mmol/L — ABNORMAL LOW (ref 98–111)
Creatinine, Ser: 0.69 mg/dL (ref 0.44–1.00)
GFR, Estimated: >60 mL/min (ref >60)
Glucose, Bld: 114 mg/dL — ABNORMAL HIGH (ref 70–99)
Potassium: 4 mmol/L (ref 3.5–5.1)
Sodium: 133 mmol/L — ABNORMAL LOW (ref 135–145)
Total Bilirubin: 0.6 mg/dL (ref 0.3–1.2)
Total Protein: 7.7 g/dL (ref 6.5–8.1)

## 2021-09-19 LAB — RESP PANEL BY RT-PCR (FLU A&B, COVID) ARPGX2
Influenza A by PCR: NEGATIVE
Influenza B by PCR: NEGATIVE
SARS Coronavirus 2 by RT PCR: NEGATIVE

## 2021-09-19 LAB — URINALYSIS, ROUTINE W REFLEX MICROSCOPIC
Bilirubin Urine: NEGATIVE
Glucose, UA: NEGATIVE mg/dL
Hgb urine dipstick: NEGATIVE
Ketones, ur: NEGATIVE mg/dL
Leukocytes,Ua: NEGATIVE
Nitrite: NEGATIVE
Protein, ur: NEGATIVE mg/dL
Specific Gravity, Urine: 1.012 (ref 1.005–1.030)
pH: 7 (ref 5.0–8.0)

## 2021-09-19 LAB — T4, FREE: Free T4: 1.24 ng/dL — ABNORMAL HIGH (ref 0.61–1.12)

## 2021-09-19 LAB — MAGNESIUM: Magnesium: 2.1 mg/dL (ref 1.7–2.4)

## 2021-09-19 LAB — LACTIC ACID, PLASMA: Lactic Acid, Venous: 1.1 mmol/L (ref 0.5–1.9)

## 2021-09-19 LAB — TSH: TSH: 6.268 u[IU]/mL — ABNORMAL HIGH (ref 0.350–4.500)

## 2021-09-19 IMAGING — CT CT HEAD W/O CM
4 series · 16 of 47 positions shown, 18 images · non-contrast
Comparison: Head CT [DATE].

CLINICAL DATA: Headache and altered mental status.



[Series 2: head wo · axial · 0.43mm/px · z∈[-131,-11]mm · 7 of 33 slices shown, 9 images]
[im 5/33  brain]
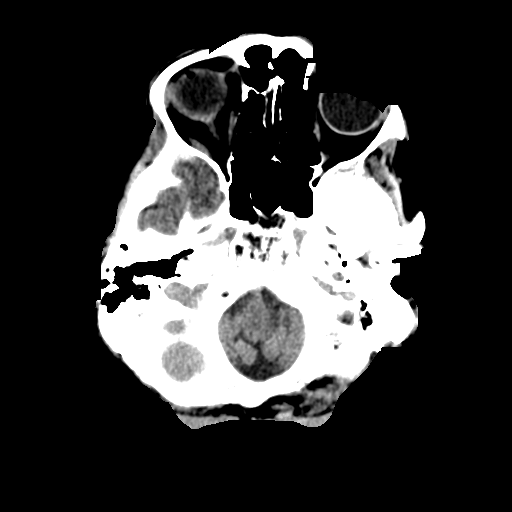
[im 5/33  bone]
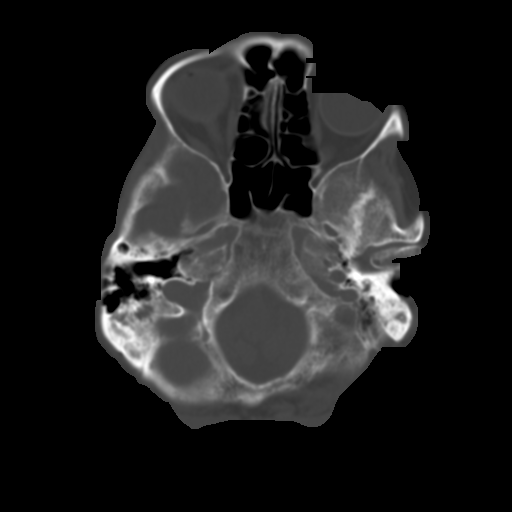
[im 9/33  brain]
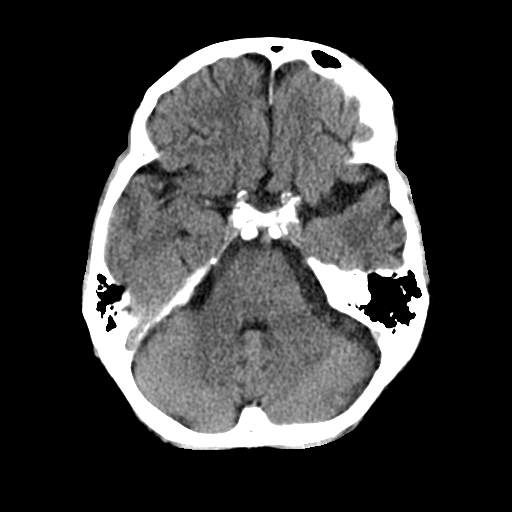
[im 13/33  brain]
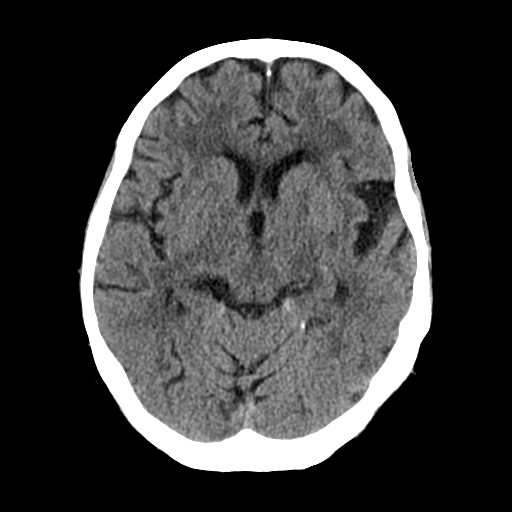
[im 17/33  brain]
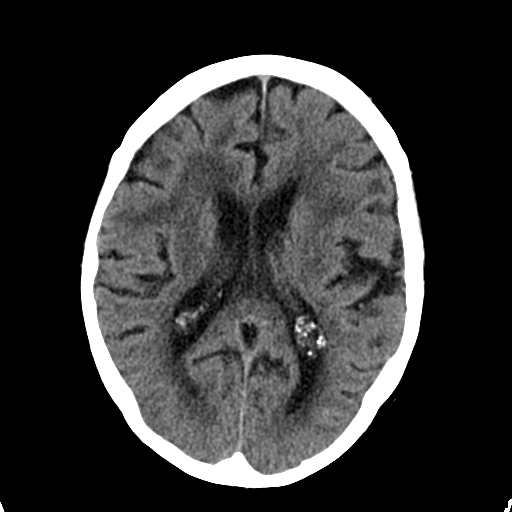
[im 21/33  brain]
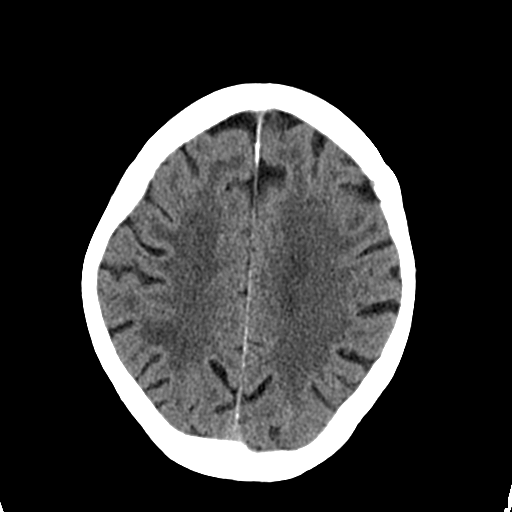
[im 21/33  bone]
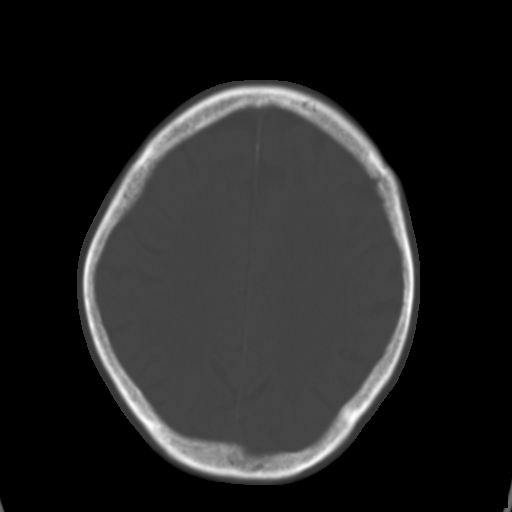
[im 25/33  brain]
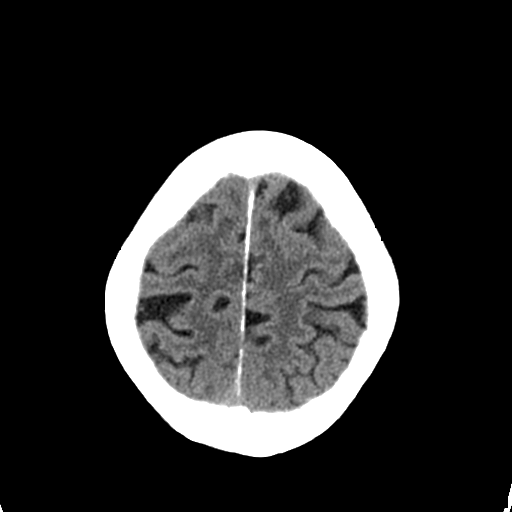
[im 29/33  brain]
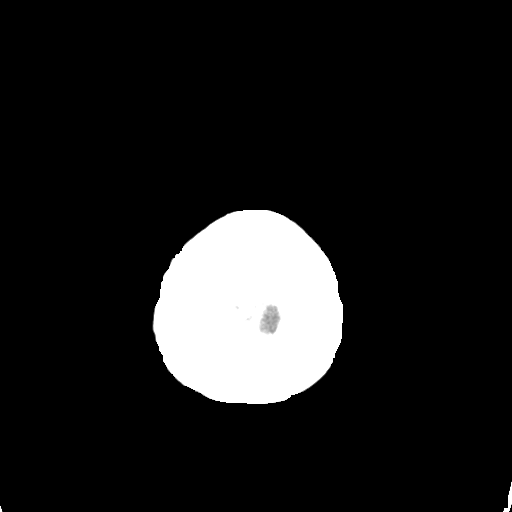

[Series 3: head bone · axial · 0.43mm/px · z∈[-135,-103]mm · 3 of 81 slices shown]
[im 9/81  bone]
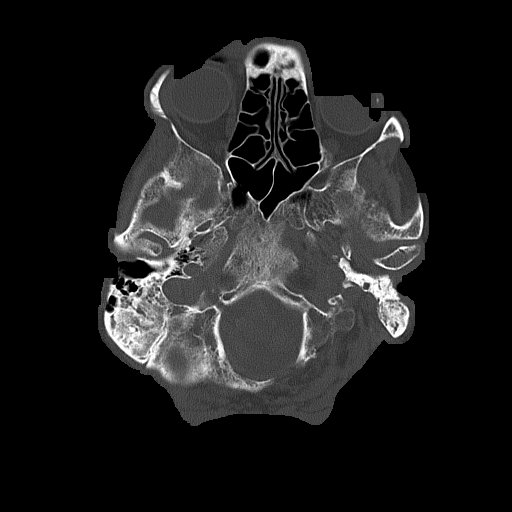
[im 17/81  bone]
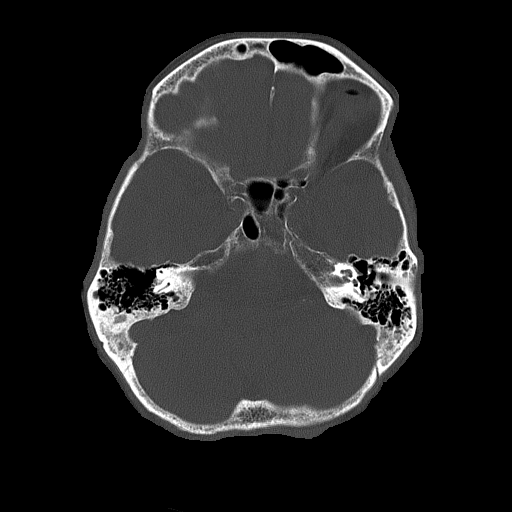
[im 25/81  bone]
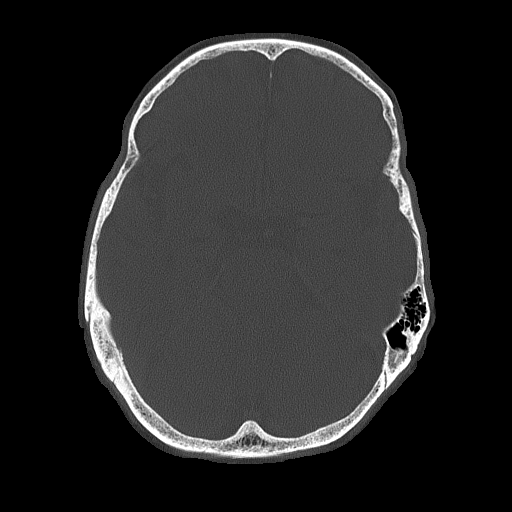

[Series 4: coronal soft tissue · coronal · 0.33mm/px · 3 of 73 slices shown]
[im 25/73  brain]
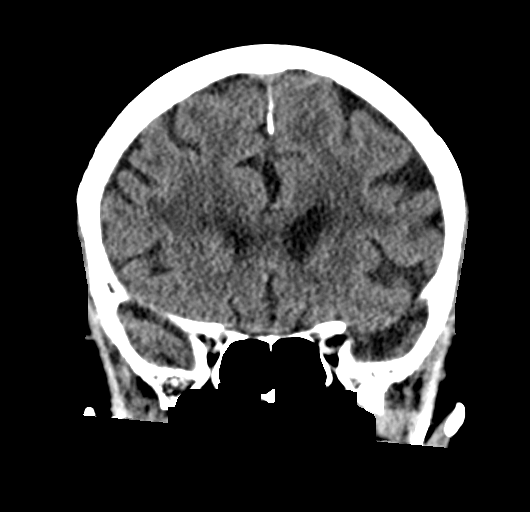
[im 33/73  brain]
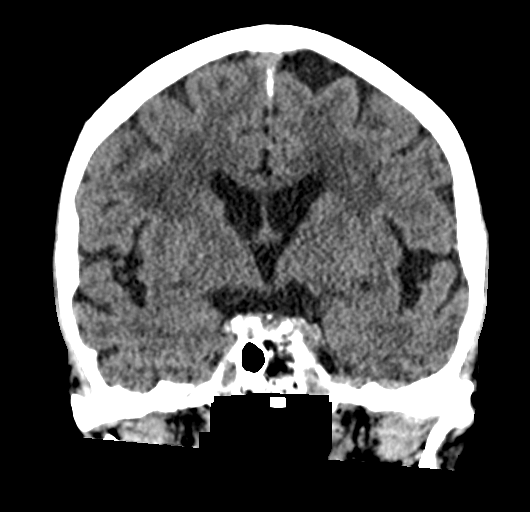
[im 41/73  brain]
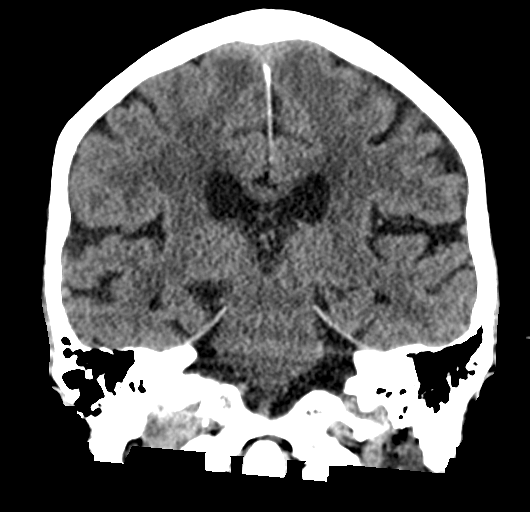

[Series 5: sagittal soft tissue · sagittal · 0.36mm/px · 3 of 58 slices shown]
[im 20/58  brain]
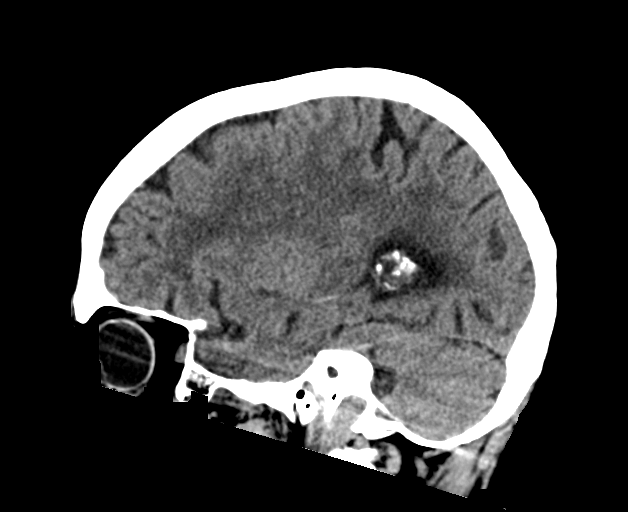
[im 29/58  brain]
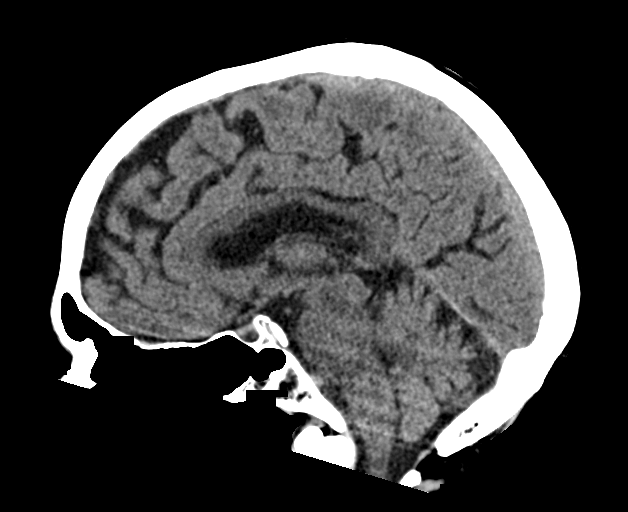
[im 39/58  brain]
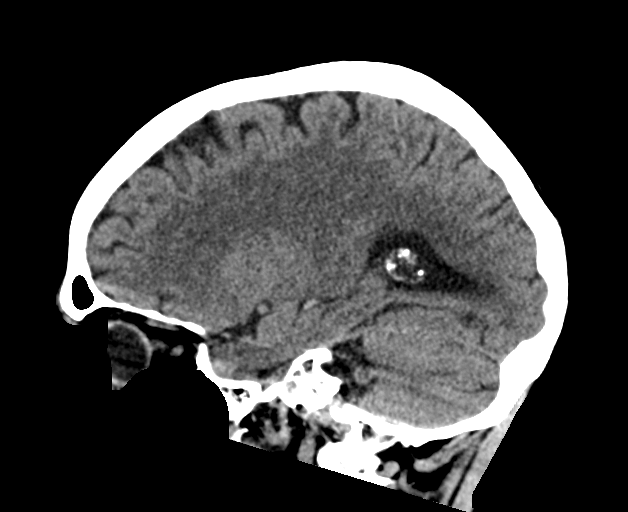

[16 of 47 positions shown; findings below may reference images not displayed]

FINDINGS: Brain: There is mild atrophy, mild atrophic ventriculomegaly and
moderate to severe small vessel disease of the cerebral white
matter, relatively mild cerebellar atrophy.

No focal asymmetry is seen worrisome for acute cortical based
infarct, hemorrhage or mass. There is no midline shift. Basal
cisterns are patent. Scattered benign dural calcifications extend
along the falx and tentorium.

Vascular: There are patchy calcifications in the distal vertebral
arteries and siphons but no hyperdense central vessel is seen.

Skull: Normal. Negative for fracture or focal lesion.

Sinuses/Orbits: Visualized sinuses are clear. Minimal fluid in the
inferior mastoid air cells is again noted, with hyperostosis
consistent with chronic disease. The remaining mastoid air cells are
clear. There is evidence of prior lens replacements again noted.

Other: A congenital fusion defect in the midline posterior C1 ring
is incidentally noted.
IMPRESSION: No acute intracranial CT findings or interval changes. Stable
atrophy with advanced small vessel disease.

## 2021-09-19 IMAGING — DX DG CHEST 1V PORT
1 series · 1 of 1 positions shown · non-contrast
Comparison: Chest CT [DATE], AP Lat chest [DATE]

CLINICAL DATA: Altered mental status.

EXAM:
PORTABLE CHEST 1 VIEW

[chest ap]
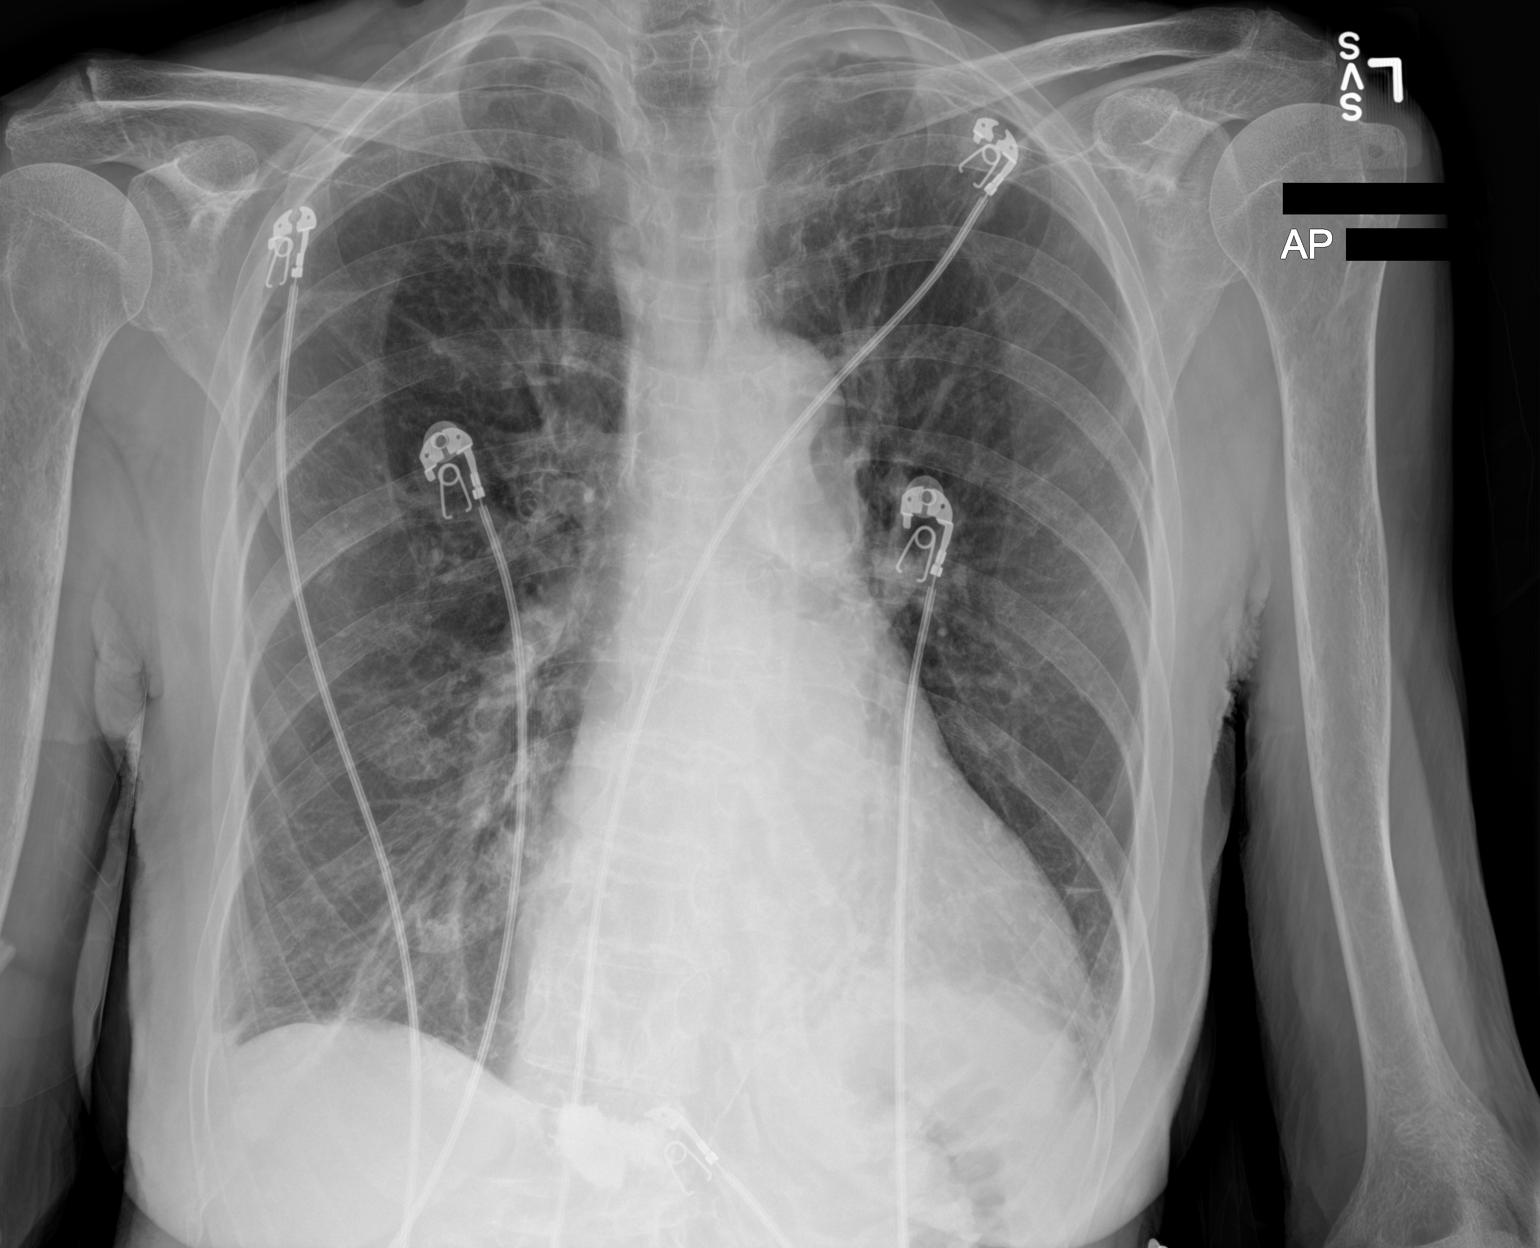

[1 of 1 positions shown; findings below may reference images not displayed]

FINDINGS: The heart is enlarged. Central vessels are normal are normal
caliber. There is aortic atherosclerosis with stable mediastinum.

COPD and chronic apical scarring changes. Also again noted is
chronic left lower lobe medial basal fibrotic consolidation and
bronchiectasis. There is chronic linear scarring in the right lower
lung field.

No focal pneumonia is seen or new opacity. Osteopenia and mild
thoracic levoscoliosis. Chronic kyphoplasty cement at L1.
IMPRESSION: COPD with chronic change, including fibrotic left lower lobe
consolidation in the medial base. No new abnormality. No acute chest
findings. Cardiomegaly.

## 2021-09-19 IMAGING — MR MR HEAD WO/W CM
16 series · 48 of 48 positions shown · IV contrast (gadavist)
Comparison: Head CT [DATE]

CLINICAL DATA: Headache. Hypertension. Dural venous sinus
thrombosis suspected.

EXAM:
MRI HEAD WITHOUT AND WITH CONTRAST
MR VENOGRAM HEAD WITHOUT AND WITH CONTRAST
TECHNIQUE: Multiplanar, multi-echo pulse sequences of the brain and surrounding
structures were acquired without and with intravenous contrast.
Angiographic images of the intracranial venous structures were
acquired using MRV technique without and with intravenous contrast.
CONTRAST:  5mL GADAVIST GADOBUTROL 1 MMOL/ML IV SOLN

[Series 5: ax dwi_tracew · axial · 3.0mm · 0.65mm/px · z∈[-79,+74]mm · 3 of 48 slices shown]
[im 1/48]
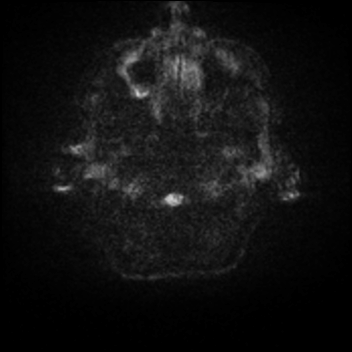
[im 24/48]
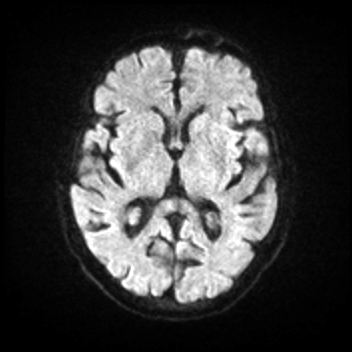
[im 48/48]
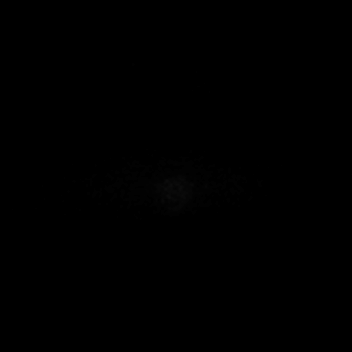

[Series 6: ax dwi_adc · axial · 3.0mm · 0.65mm/px · z∈[-79,+71]mm · 3 of 47 slices shown]
[im 1/47]
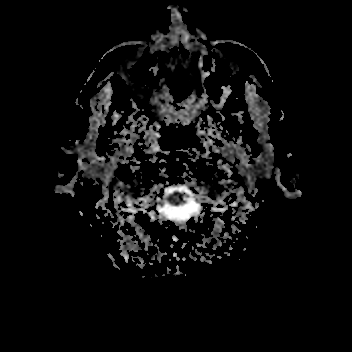
[im 24/47]
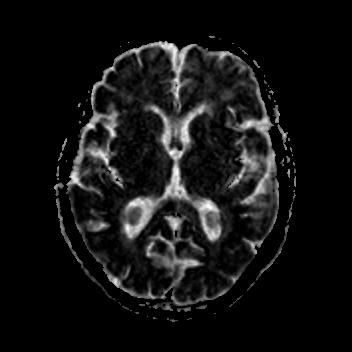
[im 47/47]
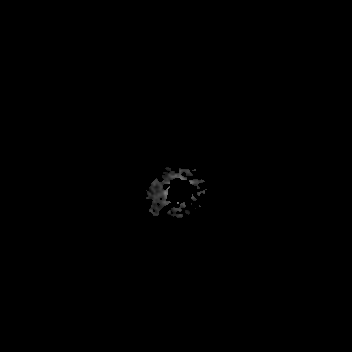

[Series 7: cor dwi_tracew · coronal · 5.0mm · 0.60mm/px · 2 of 36 slices shown]
[im 1/36]
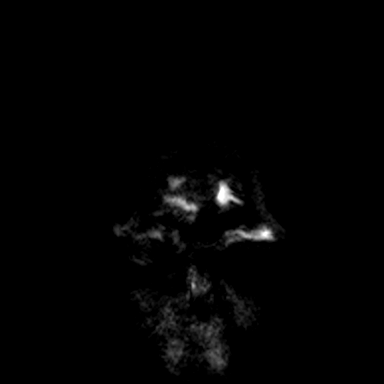
[im 36/36]
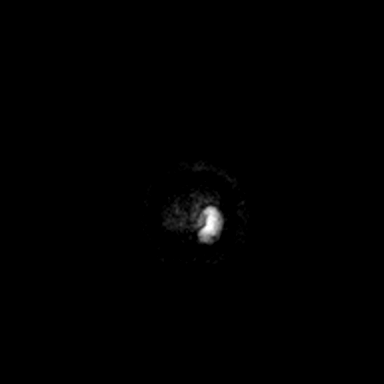

[Series 8: cor dwi_adc · coronal · 5.0mm · 0.60mm/px · 2 of 36 slices shown]
[im 1/36]
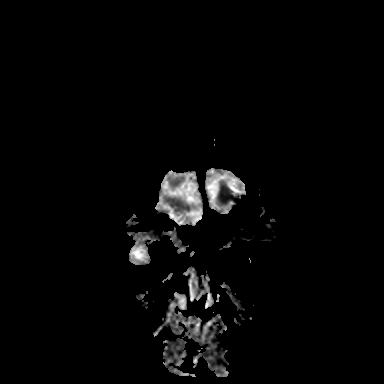
[im 36/36]
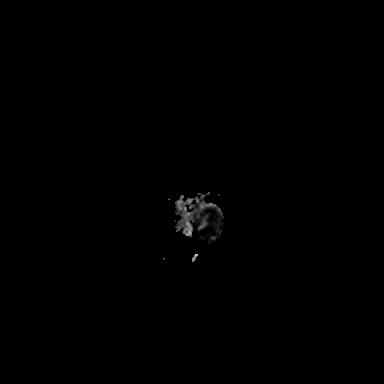

[Series 9: T1 · sagittal · 5.0mm · 0.62mm/px · 1 of 22 slices shown (1 of 2)]
[im 1/22]
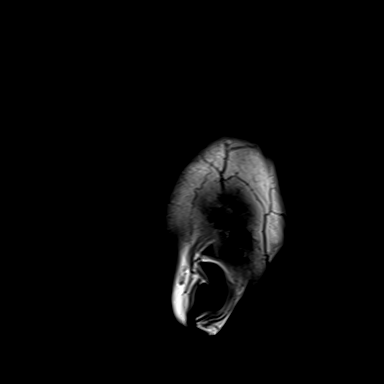

[Series 10: T2 · axial · 5.0mm · 0.53mm/px · 1 of 26 slices shown (1 of 2)]
[im 1/26]
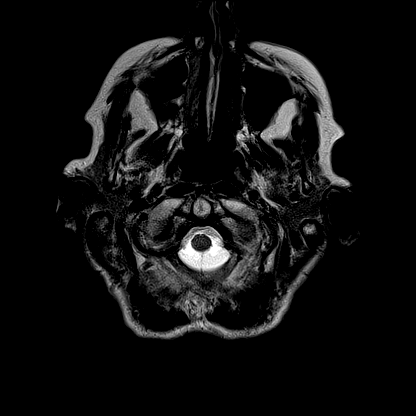

[Series 11: mag_images · axial · 3.0mm · 0.90mm/px · z∈[-89,+85]mm · 3 of 60 slices shown]
[im 1/60]
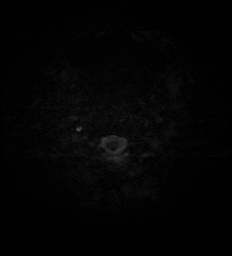
[im 30/60]
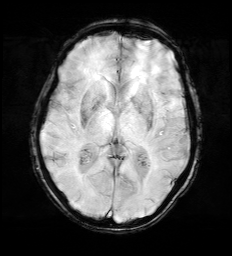
[im 60/60]
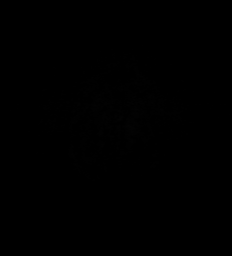

[Series 12: pha_images · axial · 3.0mm · 0.90mm/px · z∈[-89,+85]mm · 3 of 59 slices shown]
[im 1/59]
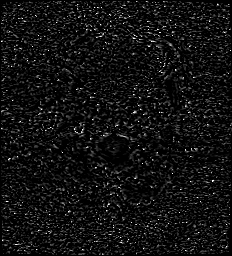
[im 30/59]
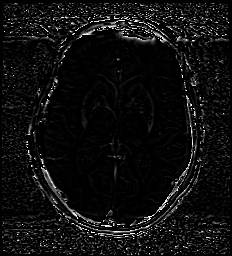
[im 59/59]
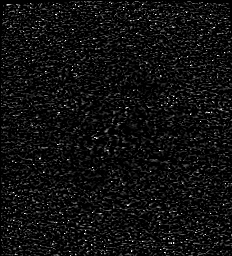

[Series 13: swi_images · axial · 3.0mm · 0.90mm/px · z∈[-89,+85]mm · 3 of 60 slices shown]
[im 1/60]
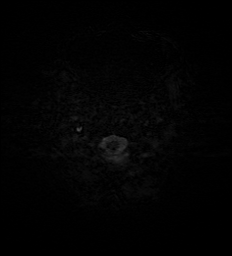
[im 30/60]
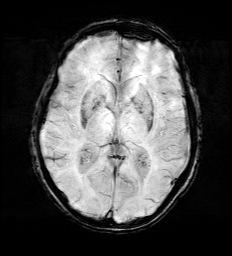
[im 60/60]
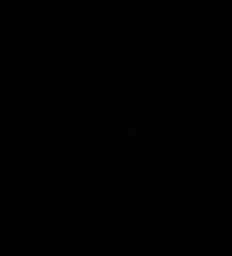

[Series 15: FLAIR · axial · 3.0mm · 0.53mm/px · z∈[-77,+75]mm · 2 of 44 slices shown (1 of 2)]
[im 1/44]
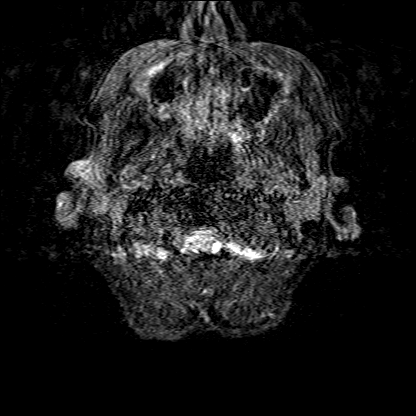
[im 44/44]
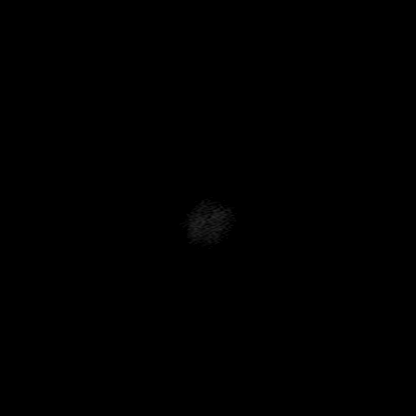

[Series 16: T2 · coronal · 5.0mm · 0.57mm/px · 2 of 28 slices shown (2 of 2)]
[im 1/28]
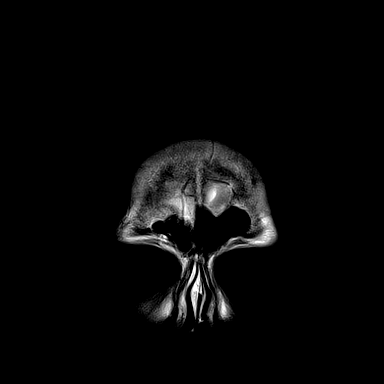
[im 28/28]
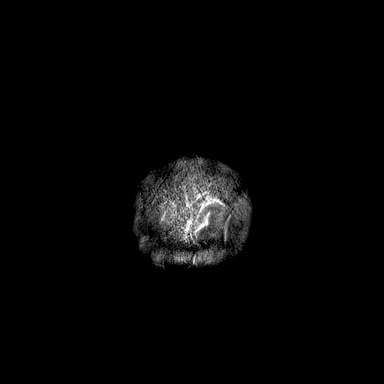

[Series 17: T1 · axial · 1.0mm · 0.98mm/px · z∈[-90,+78]mm · 9 of 171 slices shown (2 of 2)]
[im 1/171]
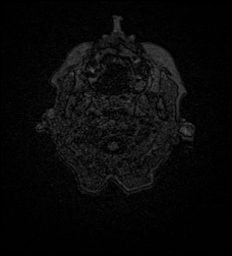
[im 22/171]
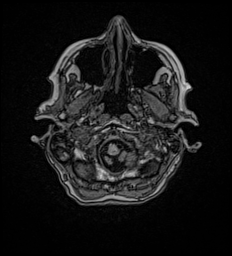
[im 43/171]
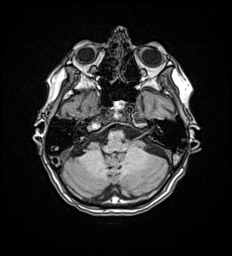
[im 64/171]
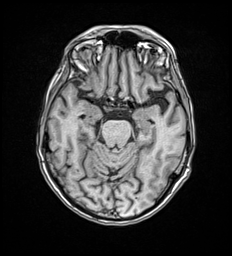
[im 86/171]
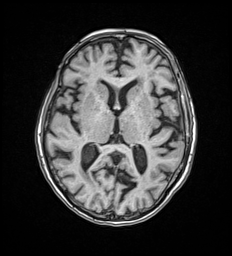
[im 107/171]
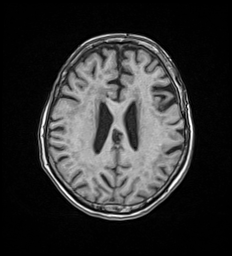
[im 128/171]
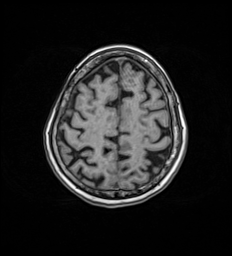
[im 149/171]
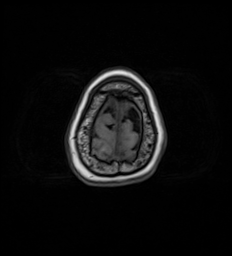
[im 171/171]
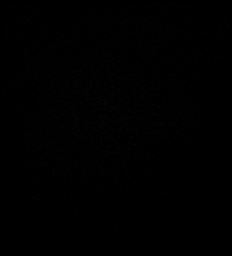

[Series 18: FLAIR · axial · 5.0mm · 1.20mm/px · 1 of 27 slices shown (2 of 2)]
[im 1/27]
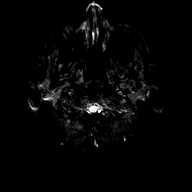

[Series 23: T2 post-contrast · coronal · 5.0mm · 0.57mm/px · 2 of 28 slices shown]
[im 1/28]
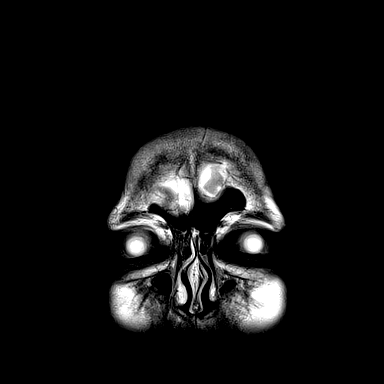
[im 28/28]
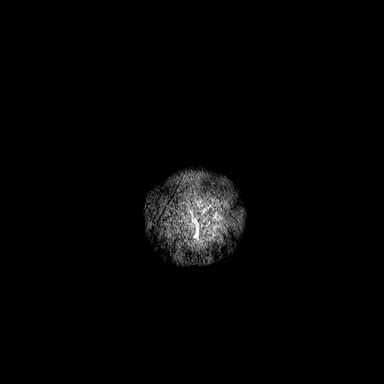

[Series 24: T1 post-contrast · axial · 1.0mm · 0.98mm/px · z∈[-92,+61]mm · 9 of 160 slices shown (1 of 2)]
[im 1/160]
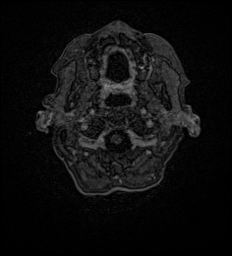
[im 20/160]
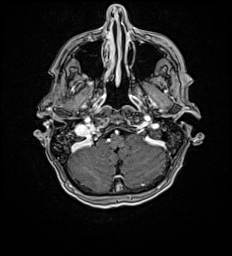
[im 40/160]
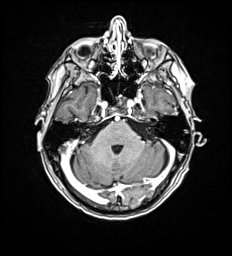
[im 60/160]
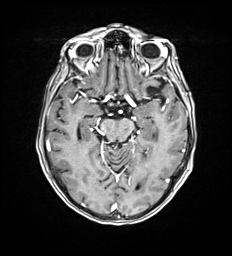
[im 80/160]
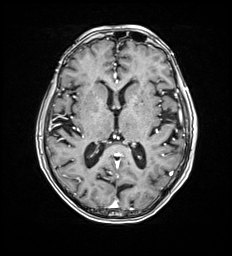
[im 100/160]
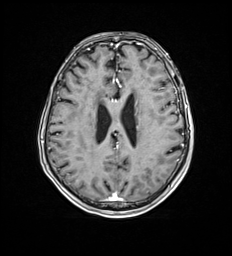
[im 120/160]
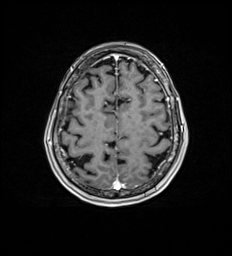
[im 140/160]
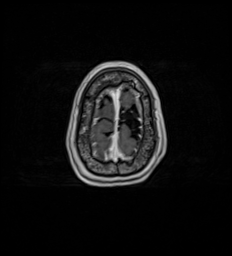
[im 160/160]
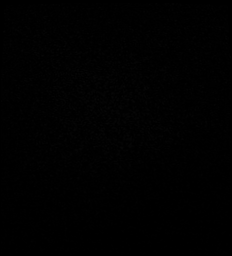

[Series 25: T1 post-contrast · coronal · 5.0mm · 0.57mm/px · 2 of 28 slices shown (2 of 2)]
[im 1/28]
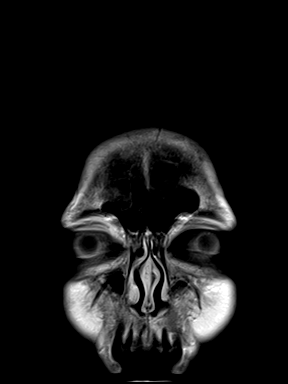
[im 28/28]
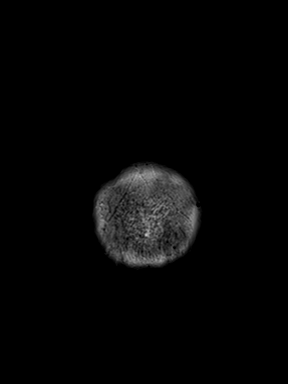

[48 of 48 positions shown; findings below may reference images not displayed]

FINDINGS: MRI HEAD WITHOUT AND WITH CONTRAST

Brain: No acute infarction, hemorrhage, hydrocephalus, extra-axial
collection or mass lesion. No pathologic intracranial enhancement.
6.5 mm pituitary cyst without mass effect or invasive features. The
cyst appears simple on pre and postcontrast imaging. No further
imaging evaluation is necessary. This follows ACR consensus
guidelines: Management of Incidental Pituitary Findings on CT, MRI
and F18-FDG PET: A White Paper of the ACR Incidental Findings
Committee. [HOSPITAL] [5D]; 15: 966-72.

Cerebral volume loss in keeping with age. Fairly extensive chronic
small vessel ischemic gliosis in the hemispheric white matter.

Vascular: Normal flow voids and vascular enhancements

Skull and upper cervical spine: No focal marrow lesion.

Sinuses/Orbits: Mild mucosal thickening and mastoid air cells.
Bilateral cataract resection. No sinusitis

MR VENOGRAM HEAD WITHOUT AND WITH CONTRAST

There is no evidence of dural venous sinus or deep cerebral vein
thrombosis. No dural venous sinus stenosis. Dominant right
transverse sigmoid drainage with the majority of left flow coming
from the vein of Labbe. Left transverse sinus filling defect from
normal arachnoid granulation.
IMPRESSION: Brain MRI:

1. No acute finding or explanation for symptoms.
2. Chronic small vessel ischemia.

MRV:

Negative.

## 2021-09-19 IMAGING — MR MR MRV HEAD WO/W CM
3 of 5 series · 8 of 48 positions shown · IV contrast (5ml Gadavist)
Comparison: Head CT [DATE]

CLINICAL DATA: Headache. Hypertension. Dural venous sinus
thrombosis suspected.

EXAM:
MRI HEAD WITHOUT AND WITH CONTRAST
MR VENOGRAM HEAD WITHOUT AND WITH CONTRAST
TECHNIQUE: Multiplanar, multi-echo pulse sequences of the brain and surrounding
structures were acquired without and with intravenous contrast.
Angiographic images of the intracranial venous structures were
acquired using MRV technique without and with intravenous contrast.
CONTRAST:  5mL GADAVIST GADOBUTROL 1 MMOL/ML IV SOLN

[Series 12: T1 · sagittal · 1.0mm · 0.98mm/px · 3 of 160 slices shown (1 of 3)]
[im 16/160]
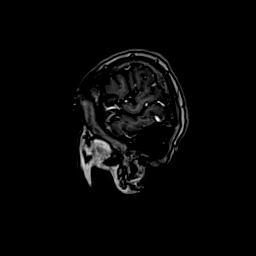
[im 80/160]
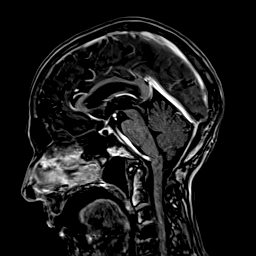
[im 144/160]
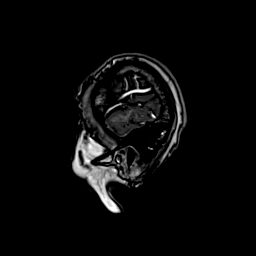

[Series 1063: T1 · coronal · 1.0mm · 0.24mm/px · 3 of 204 slices shown (2 of 3)]
[im 32/204]
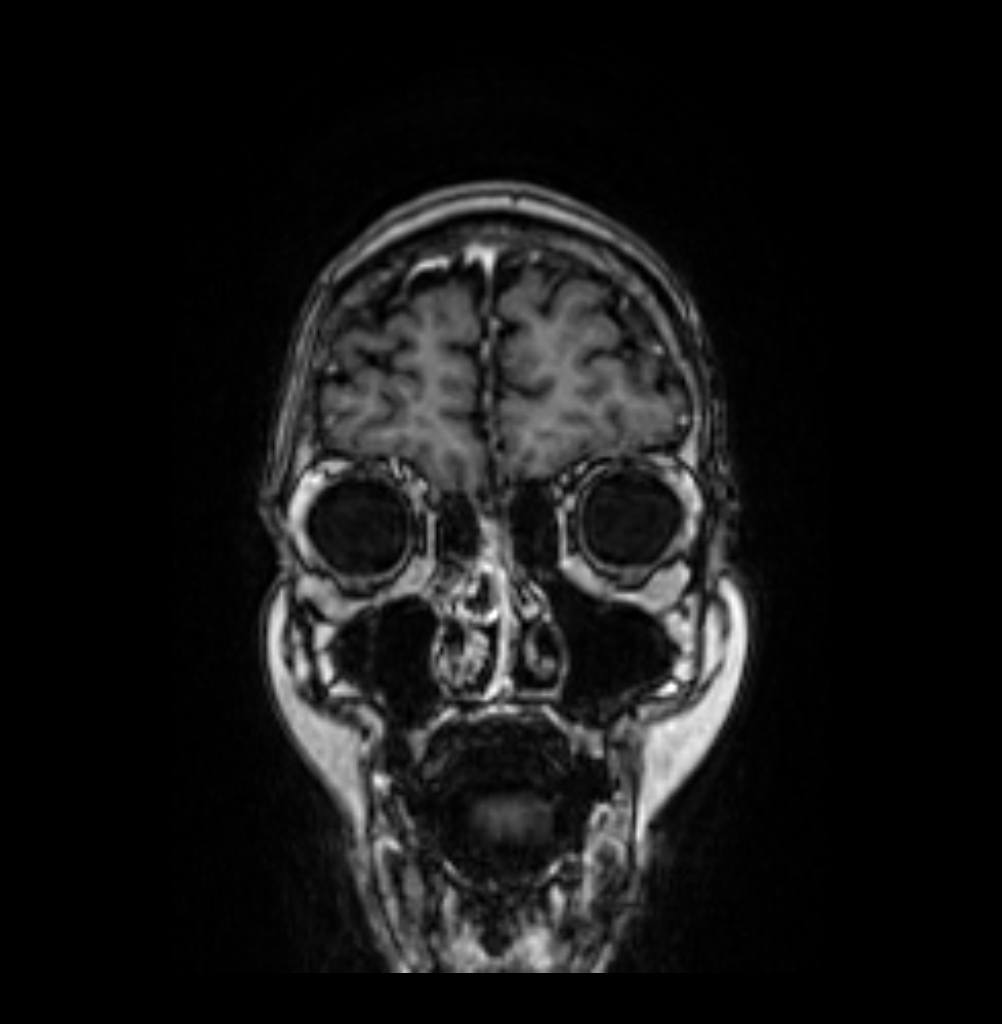
[im 110/204]
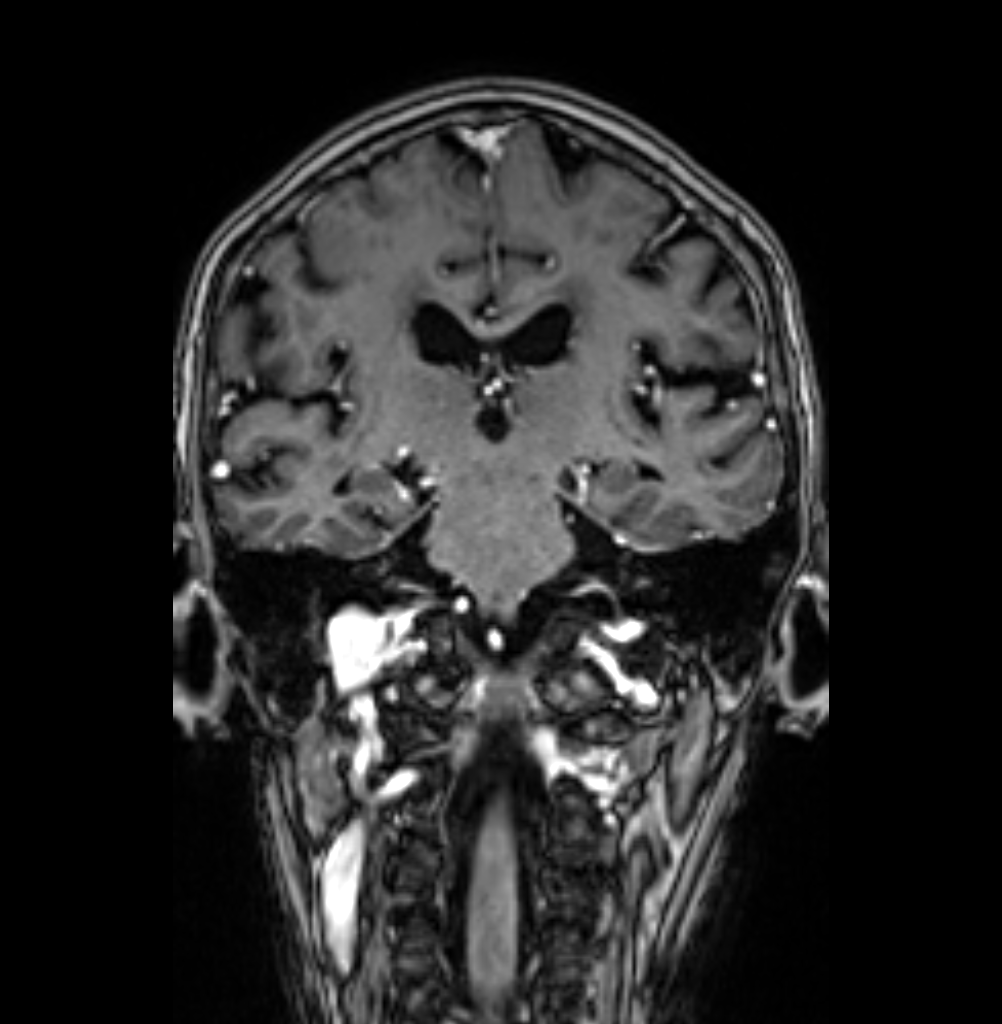
[im 172/204]
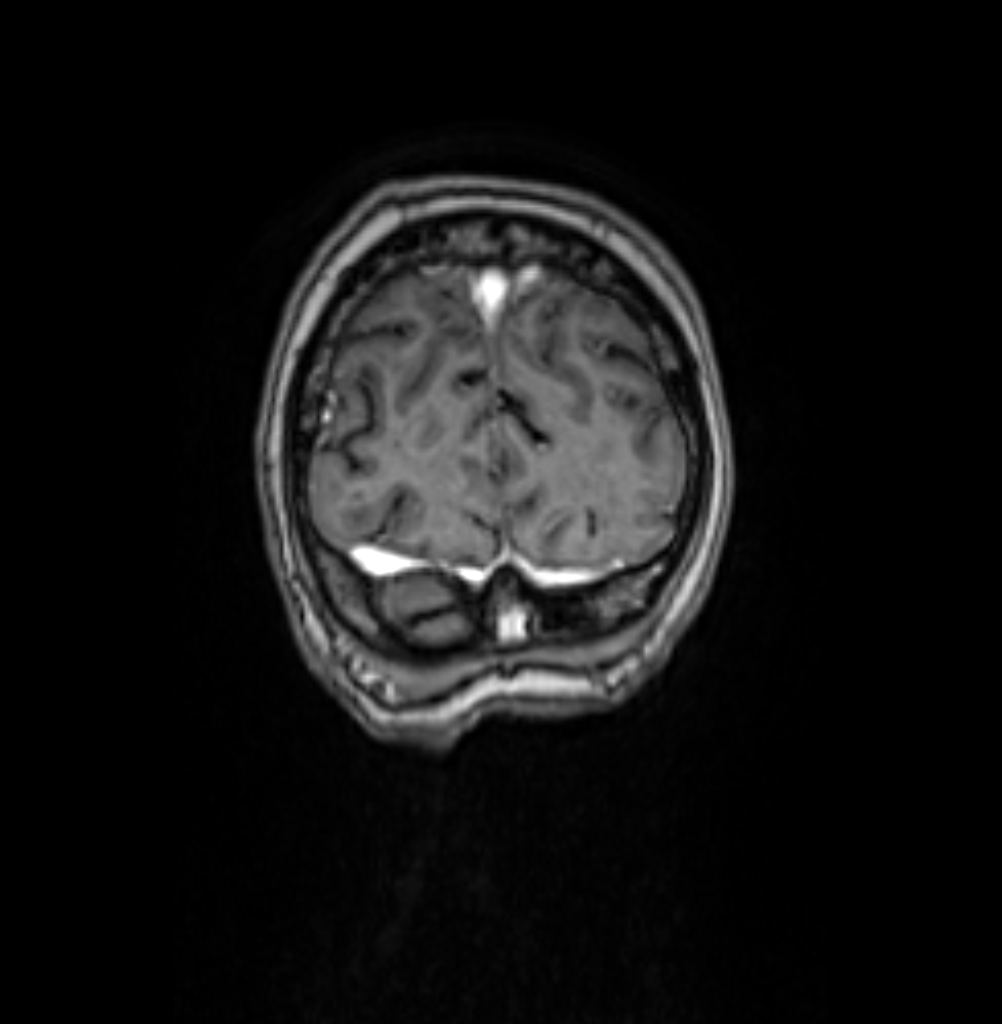

[Series 1069: T1 · axial · 1.0mm · 0.24mm/px · z∈[-78,-17]mm · 2 of 162 slices shown (3 of 3)]
[im 17/162]
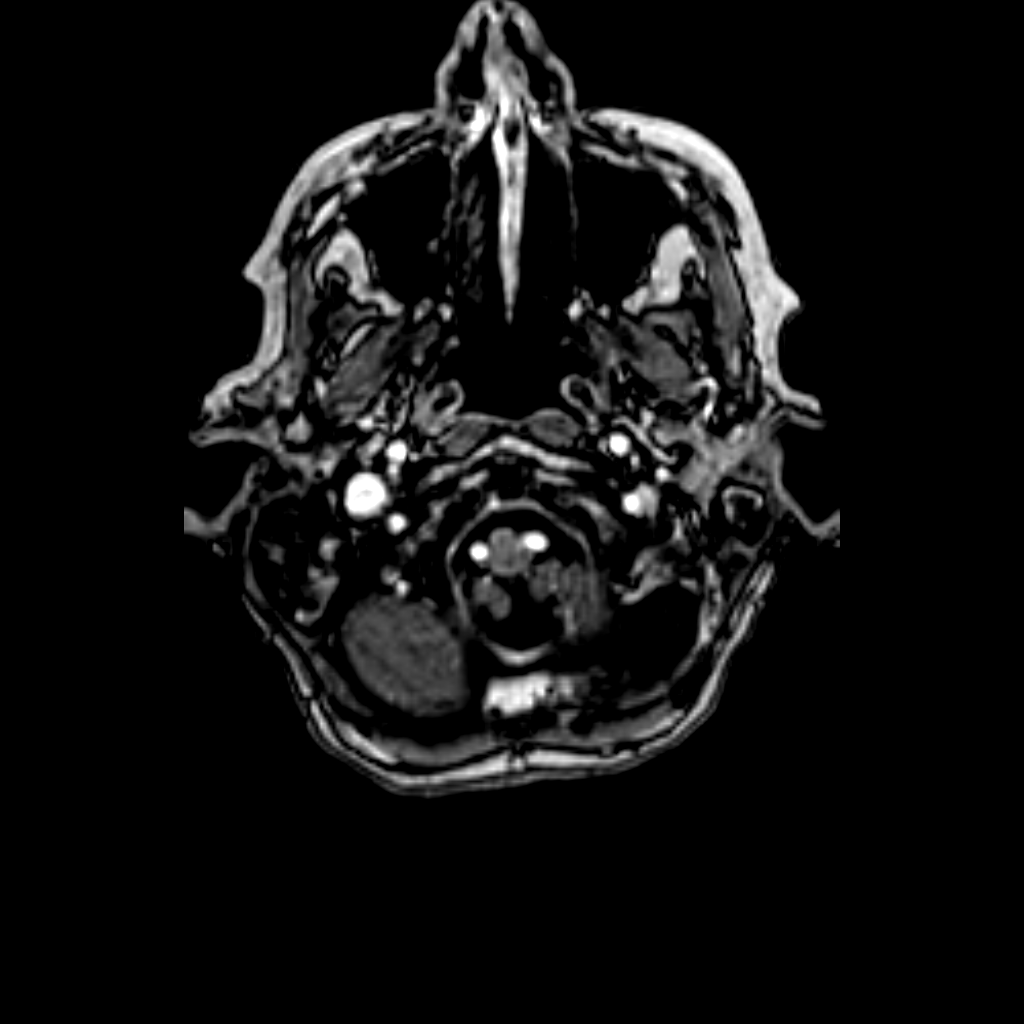
[im 81/162]
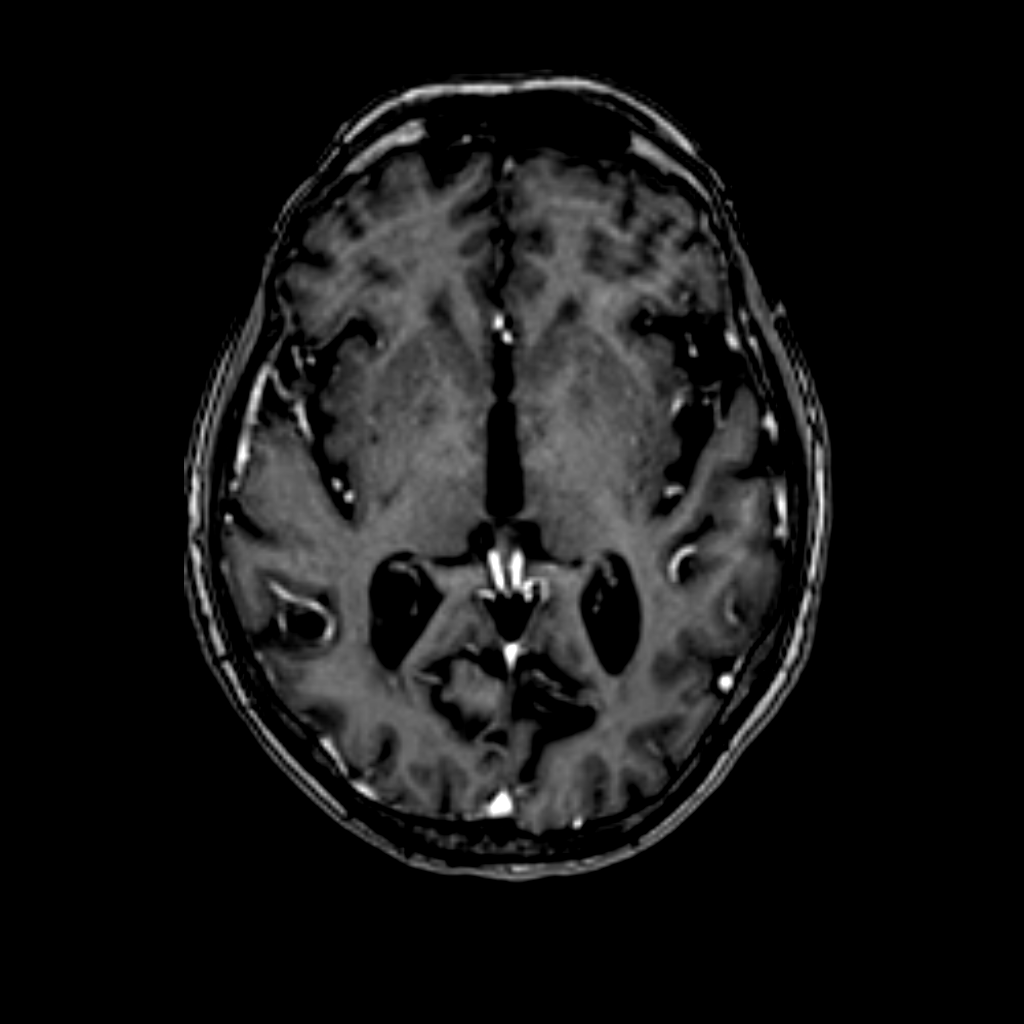

[8 of 48 positions shown; findings below may reference images not displayed]

FINDINGS: MRI HEAD WITHOUT AND WITH CONTRAST

Brain: No acute infarction, hemorrhage, hydrocephalus, extra-axial
collection or mass lesion. No pathologic intracranial enhancement.
6.5 mm pituitary cyst without mass effect or invasive features. The
cyst appears simple on pre and postcontrast imaging. No further
imaging evaluation is necessary. This follows ACR consensus
guidelines: Management of Incidental Pituitary Findings on CT, MRI
and F18-FDG PET: A White Paper of the ACR Incidental Findings
Committee. [HOSPITAL] [5D]; 15: 966-72.

Cerebral volume loss in keeping with age. Fairly extensive chronic
small vessel ischemic gliosis in the hemispheric white matter.

Vascular: Normal flow voids and vascular enhancements

Skull and upper cervical spine: No focal marrow lesion.

Sinuses/Orbits: Mild mucosal thickening and mastoid air cells.
Bilateral cataract resection. No sinusitis

MR VENOGRAM HEAD WITHOUT AND WITH CONTRAST

There is no evidence of dural venous sinus or deep cerebral vein
thrombosis. No dural venous sinus stenosis. Dominant right
transverse sigmoid drainage with the majority of left flow coming
from the vein of Labbe. Left transverse sinus filling defect from
normal arachnoid granulation.
IMPRESSION: Brain MRI:

1. No acute finding or explanation for symptoms.
2. Chronic small vessel ischemia.

MRV:

Negative.

## 2021-09-19 MED ORDER — LORAZEPAM 2 MG/ML IJ SOLN
0.5000 mg | Freq: Once | INTRAMUSCULAR | Status: AC
  Administered 2021-09-19: 0.5 mg via INTRAVENOUS
  Filled 2021-09-19: qty 1

## 2021-09-19 MED ORDER — METOPROLOL TARTRATE 5 MG/5ML IV SOLN
2.5000 mg | Freq: Once | INTRAVENOUS | Status: AC
  Administered 2021-09-19: 2.5 mg via INTRAVENOUS
  Filled 2021-09-19: qty 5

## 2021-09-19 MED ORDER — GADOBUTROL 1 MMOL/ML IV SOLN
5.0000 mL | Freq: Once | INTRAVENOUS | Status: AC | PRN
  Administered 2021-09-19: 5 mL via INTRAVENOUS

## 2021-09-19 NOTE — ED Notes (Signed)
MRI tech arrived to take patient to MRI. Patient medicated prior to leaving. This nurse accidentally threw away bottle when second nurse witnessed the waste. Unable to scan into the system. ?

## 2021-09-19 NOTE — ED Notes (Signed)
Patient called this nurse to the room because she needed to go to the bathroom. She states she either has gas or needs to have a BM. After not having a BM, patient returned to her bed and was placed back on monitoring equipment. Patient continues to c/o her head but now states it feels heavy. She also continues to c/o how cold she is despite now having four blankets. I have placed socks on her feet and gotten another blanket for her. Patient had some questions regarding her MRI which were answered. The provider ordered Ativan IV to be given just prior to patient going to MRI. ?

## 2021-09-19 NOTE — ED Notes (Signed)
Patient cannot provide a list of her medications because she is unable to remember them at this time. ?

## 2021-09-19 NOTE — ED Notes (Signed)
Patient states that she was finally able to speak with her niece via telephone but is unsure as to whether or not she will be coming in. She also states that her head still feels fuzzy. She has asked me for a third time while I have been in her room this time to administer her IV Metoprolol, where her call bell is. When I attached it to her bed earlier, I showed her exactly where it was and reassured her there was no way it could fall in the floor. After asking me a third time, she made the comment that she is not usually this anxious about anything. I made the provider aware but no further orders have been given. ?

## 2021-09-19 NOTE — Discharge Instructions (Signed)
Follow-up with your cardiologist to discuss your blood pressure to see if we need to adjust your medications.  Your thyroid test was slightly abnormal discussed this with your primary care doctor.  Return to the ER if develop worsening symptoms or any other concerns otherwise your work-up today was reassuring ?

## 2021-09-19 NOTE — ED Provider Notes (Signed)
Patient  [facility] Provider Note    Event Date/Time   First MD Initiated Contact with Patient 09/19/21 [phone]     (approximate)   History   Headache   HPI  Kaylee Santiago is a 87 y.o. female brought to the ED via EMS from home with a chief complaint of altered mental status.  Has a past medical history of atrial fibrillation on metoprolol and Xarelto, hypertension, hypothyroidism, COPD, hyponatremia, hypophosphatemia.  She went to bed in her usual state of health around 10:30 PM.  Awoke at 2 AM with posterior headache and "fuzzy feeling".  States she is prescribed Ativan as needed at bedtime but she did not take any last night.  Denies facial droop, extremity weakness, vision changes, slurred speech, numbness or tingling.  Denies recent fever, cough, chest pain, shortness of breath, abdominal pain, nausea, vomiting or dizziness.  Denies recent trauma or striking head.   Past Medical History   Past Medical History:  Diagnosis Date   Atrial fibrillation [facility])    Atrial fibrillation ([place])    Back pain    Dysrhythmia    Hypertension    Hypothyroidism      Active Problem List   Patient Active Problem List   Diagnosis Date Noted   Hypophosphatemia    COPD with acute exacerbation ([place])    Hyponatremia 07/01/2021   Bronchopneumonia 07/01/2021   Bronchiectasis ([place]) 07/01/2021   Pneumonia due to COVID-19 virus 07/01/2021   Chronic respiratory failure with hypoxia ([place]) 07/01/2021   History of epistaxis [id number]   Acute metabolic encephalopathy [id number]   Malnutrition of moderate degree 04/10/2021   Acute blood loss anemia 04/10/2021   Abnormal CT of the abdomen 04/10/2021   Acute on chronic respiratory failure with hypoxia ([place]) 04/09/2021   Epistaxis 04/09/2021   AF (paroxysmal atrial fibrillation) ([place])    Hypothyroidism    Essential hypertension    Bronchiectasis without complication ([place]) [id number]     Past Surgical History   Past  Surgical History:  Procedure Laterality Date   ABDOMINAL HYSTERECTOMY     BREAST EXCISIONAL BIOPSY Right 70's   CATARACT EXTRACTION W/ INTRAOCULAR LENS  IMPLANT, BILATERAL     KYPHOPLASTY N/A 12/21/2018   Procedure: KYPHOPLASTY L1;  Surgeon: Hessie Knows, MD;  Location: [facility] ORS;  Service: Orthopedics;  Laterality: N/A;     Home Medications   Prior to Admission medications   Medication Sig Start Date End Date Taking? Authorizing Provider  albuterol (VENTOLIN HFA) 108 (90 Base) MCG/ACT inhaler Inhale 2 puffs into the lungs every 6 (six) hours. 07/04/21   Loletha Grayer, MD  amLODipine (NORVASC) 2.5 MG tablet Take 1 tablet (2.5 mg total) by mouth daily. 07/04/21   Loletha Grayer, MD  ascorbic acid (VITAMIN C) 500 MG tablet Take 1 tablet (500 mg total) by mouth daily. 07/05/21   Loletha Grayer, MD  Biotin 5000 MCG TABS Take 5,000 mcg by mouth daily.    [provider]  Calcium Carb-Cholecalciferol (CALTRATE 600+D3 PO) Take 1 tablet by mouth daily.    [provider]  LAGEVRIO 200 MG CAPS capsule Take 4 capsules by mouth 2 (two) times daily. 07/01/21   [provider]  levothyroxine (SYNTHROID) 75 MCG tablet Take 1 tablet by mouth daily. 03/03/21   [provider]  loratadine (CLARITIN) 10 MG tablet Take 10 mg by mouth daily. 07/09/21   [provider]  LORazepam (ATIVAN) 0.5 MG tablet Take 0.5 mg by mouth at bedtime as needed  for sleep.    [provider]  metoprolol succinate (TOPROL-XL) 50 MG 24 hr tablet Take 50 mg by mouth in the morning and at bedtime. Take 50 mg in the morning and 25 mg in the evening 12/26/17   [provider]  Multiple Vitamin (MULTI-VITAMIN DAILY PO) Take 1 tablet by mouth daily.    [provider]  predniSONE (DELTASONE) 10 MG tablet 4 tabs po daily for three days 07/05/21   Loletha Grayer, MD  Saccharomyces boulardii (PROBIOTIC) 250 MG CAPS Take 1 capsule by mouth daily.    [provider]  vitamin E 400 UNIT capsule Take 400 Units by mouth daily.    [provider]  XARELTO 20 MG TABS tablet Take 20 mg by mouth daily. 05/12/21   [provider]  zinc sulfate 220 (50 Zn) MG capsule Take 1 capsule (220 mg total) by mouth daily. 07/05/21   Loletha Grayer, MD     Allergies  Amoxicillin-pot clavulanate, Levofloxacin, and Trazodone   Family History   Family History  Problem Relation Age of Onset   Breast cancer Sister 44   Breast cancer Sister        65's   CAD Mother      Physical Exam  Triage Vital Signs: ED Triage Vitals [09/19/21 0340]  Enc Vitals Group     BP      Pulse      Resp      Temp      Temp src      SpO2      Weight 120 lb (54.4 kg)     Height 5\' 11"  (1.803 m)     Head Circumference      Peak Flow      Pain Score      Pain Loc      Pain Edu?      Excl. in [place]?     Updated Vital Signs: BP (!) 185/75    Pulse 90    Temp 98 F (36.7 C) (Oral)    Resp 17    Ht 5\' 11"  (1.803 m)    Wt 54.4 kg    SpO2 95%    BMI 16.74 kg/m    General: Awake, no distress.  CV:  Regular rate, irregular rhythm.  Good peripheral perfusion.  Resp:  Normal effort.  CTA B. Abd:  Nontender no distention.  Other:  Alert and oriented x3.  CN II-XII grossly intact.  5/5 motor strength and sensation all extremities.  No carotid bruits.   ED Results / Procedures / Treatments  Labs (all labs ordered are listed, but only abnormal results are displayed) Labs Reviewed  COMPREHENSIVE METABOLIC PANEL - Abnormal; Notable for the following components:      Result Value   Sodium 133 (*)    Chloride 94 (*)    Glucose, Bld 114 (*)    All other components within normal limits  URINALYSIS, ROUTINE W REFLEX MICROSCOPIC - Abnormal; Notable for the following components:   Color, Urine YELLOW (*)    APPearance CLEAR (*)    All other components within normal limits  TSH - Abnormal; Notable for the following components:   TSH 6.268 (*)    All  other components within normal limits  T4, FREE - Abnormal; Notable for the following components:   Free T4 1.24 (*)    All other components within normal limits  CULTURE, BLOOD (ROUTINE X 2)  CULTURE, BLOOD (ROUTINE X 2)  RESP PANEL BY RT-PCR (FLU A&B, COVID) ARPGX2  URINE CULTURE  LACTIC ACID, PLASMA  CBC WITH DIFFERENTIAL/PLATELET  MAGNESIUM  PHOSPHORUS  TROPONIN I (HIGH SENSITIVITY)  TROPONIN I (HIGH SENSITIVITY)     EKG  ED ECG REPORT I, Nakari Bracknell J, the attending physician, personally viewed and interpreted this ECG.   Date: 09/19/2021  EKG Time: 0343  Rate: 93  Rhythm: normal sinus rhythm  Axis: Borderline LAD  Intervals:none  ST&T Change: Nonspecific    RADIOLOGY Have independently visualized and reviewed patient's CT head and chest x-ray as well as noted the radiology interpretation:  CT head: No ICH  Chest x-ray: No acute cardiopulmonary process  MRI/MRV: Pending  Official radiology report(s): CT Head Wo Contrast  Result Date: 09/19/2021 CLINICAL DATA:  Headache and altered mental status. EXAM: CT HEAD WITHOUT CONTRAST TECHNIQUE: Contiguous axial images were obtained from the base of the skull through the vertex without intravenous contrast. RADIATION DOSE REDUCTION: This exam was performed according to the departmental dose-optimization program which includes automated exposure control, adjustment of the mA and/or kV according to patient size and/or use of iterative reconstruction technique. COMPARISON:  Head CT 07/01/2021. FINDINGS: Brain: There is mild atrophy, mild atrophic ventriculomegaly and moderate to severe small vessel disease of the cerebral white matter, relatively mild cerebellar atrophy. No focal asymmetry is seen worrisome for acute cortical based infarct, hemorrhage or mass. There is no midline shift. Basal cisterns are patent. Scattered benign dural calcifications extend along the falx and tentorium. Vascular: There are patchy calcifications in  the distal vertebral arteries and siphons but no hyperdense central vessel is seen. Skull: Normal. Negative for fracture or focal lesion. Sinuses/Orbits: Visualized sinuses are clear. Minimal fluid in the inferior mastoid air cells is again noted, with hyperostosis consistent with chronic disease. The remaining mastoid air cells are clear. There is evidence of prior lens replacements again noted. 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 IMPRESSION: No acute intracranial CT findings or interval changes. Stable atrophy with advanced small vessel disease. Electronically Signed   By: Telford Nab M.D.   On: 09/19/2021 04:18   DG Chest Port 1 View  Result Date: 09/19/2021 CLINICAL DATA:  Altered mental status. EXAM: PORTABLE CHEST 1 VIEW COMPARISON:  Chest CT 03/26/2020, AP Lat chest 07/01/2021 FINDINGS: The heart is enlarged. Central vessels are normal are normal caliber. There is aortic atherosclerosis with stable mediastinum. COPD and chronic apical scarring changes. Also again noted is chronic left lower lobe medial basal fibrotic consolidation and bronchiectasis. There is chronic linear scarring in the right lower lung field. No focal pneumonia is seen or new opacity. Osteopenia and mild thoracic levoscoliosis. Chronic kyphoplasty cement at L1. IMPRESSION: COPD with chronic change, including fibrotic left lower lobe consolidation in the medial base. No new abnormality. No acute chest findings. Cardiomegaly. Electronically Signed   By: Telford Nab M.D.   On: 09/19/2021 04:08     PROCEDURES:  Critical Care performed: No  NIH Stroke Scale  Interval: Baseline Time: 7:05 AM Person Administering Scale: Seriyah Collison J  Administer stroke scale items in the order listed. Record performance in each category after each subscale exam. Do not go back and change scores. Follow directions provided for each exam technique. Scores should reflect what the patient  does, not what the clinician thinks the patient can do. The clinician should record answers while administering the exam and work quickly. Except where indicated, the patient should not be coached (i.e., repeated  requests to patient to make a special effort).   1a  Level of consciousness: 0=alert; keenly responsive  1b. LOC questions:  0=Performs both tasks correctly  1c. LOC commands: 0=Performs both tasks correctly  2.  Best Gaze: 0=normal  3.  Visual: 0=No visual loss  4. Facial Palsy: 0=Normal symmetric movement  5a.  Motor left arm: 0=No drift, limb holds 90 (or 45) degrees for full 10 seconds  5b.  Motor right arm: 0=No drift, limb holds 90 (or 45) degrees for full 10 seconds  6a. motor left leg: 0=No drift, limb holds 90 (or 45) degrees for full 10 seconds  6b  Motor right leg:  0=No drift, limb holds 90 (or 45) degrees for full 10 seconds  7. Limb Ataxia: 0=Absent  8.  Sensory: 0=Normal; no sensory loss  9. Best Language:  0=No aphasia, normal  10. Dysarthria: 0=Normal  11. Extinction and Inattention: 0=No abnormality  12. Distal motor function: 0=Normal   Total:   0     .1-3 Lead EKG Interpretation Performed by: Paulette Blanch, MD Authorized by: Paulette Blanch, MD     Interpretation: abnormal     ECG rate:  93   ECG rate assessment: normal     Rhythm: atrial fibrillation     Ectopy: none     Conduction: normal   Comments:     Patient placed on cardiac monitor to evaluate for arrhythmias   MEDICATIONS ORDERED IN ED: Medications  metoprolol tartrate (LOPRESSOR) injection 2.5 mg (2.5 mg Intravenous Given 09/19/21 0434)  LORazepam (ATIVAN) injection 0.5 mg (0.5 mg Intravenous Given 09/19/21 0540)  gadobutrol (GADAVIST) 1 MMOL/ML injection 5 mL (5 mLs Intravenous Contrast Given 09/19/21 0624)     IMPRESSION / MDM / ASSESSMENT AND PLAN / ED COURSE  I reviewed the triage vital signs and the nursing notes.                             87 year old female presenting with  altered mental status. Differential diagnosis includes, but is not limited to, alcohol, illicit or prescription medications, or other toxic ingestion; intracranial pathology such as stroke or intracerebral hemorrhage; fever or infectious causes including sepsis; hypoxemia and/or hypercarbia; uremia; trauma; endocrine related disorders such as diabetes, hypoglycemia, and thyroid-related diseases; hypertensive encephalopathy; etc. I have personally reviewed patient's chart and note her hospitalization from 07/01/2021-07/04/2021 for altered mental status secondary to COVID-19.  She was found to be hyponatremic on that admission with sodium 121.  I have also noted 08/03/2021 PM&R office visit where patient's blood pressure was documented 132/80.  The patient is on the cardiac monitor to evaluate for evidence of arrhythmia and/or significant heart rate changes.  We will obtain laboratory and imaging studies to include CT head to evaluate intracranial hemorrhage.  NIH stroke scale is 0.  No indication to initiate ED code stroke at this time.  Will monitor and reassess.  Clinical Course as of 09/19/21 0705  Sat Sep 19, 2021  0420 CT head negative for intracranial hemorrhage.  Chest x-ray unremarkable.  BP 224/98.  Heart rate atrial fibrillation 114.  Will administer Lopressor and reassess. [JS]  0510 BP 185/75.  Updated patient on all test results which is largely unremarkable.  Normal WBC, mild hyponatremia with sodium 133, negative troponin, normal magnesium and phosphorus, negative lactic acid, negative UA.  Mildly elevated thyroid panel.  Respiratory panel is negative.  BP currently 185/75.  Will obtain an  MRI/MRV to evaluate for CVA, cavernous sinus thrombosis given patient had COVID-19 in December.  Administer low-dose IV Ativan for anxiolysis. [JS]  0700 Patient still in MRI. Care transferred to Dr. Jari Pigg at change of shift pending MRI results, repeat troponin and reassessment. If those are unremarkable and  patient is feeling better, I anticipate she may be able to be discharged home. [JS]    Clinical Course User Index [JS] Paulette Blanch, MD     FINAL CLINICAL IMPRESSION(S) / ED DIAGNOSES   Final diagnoses:  Hypertension, unspecified type  Acute nonintractable headache, unspecified headache type     Rx / DC Orders   ED Discharge Orders     None        Note:  This document was prepared using Dragon voice recognition software and may include unintentional dictation errors.   Paulette Blanch, MD 09/19/21 [phone]

## 2021-09-19 NOTE — ED Notes (Signed)
Patient returned from CT and ambulated over the toilet. Patient was asked to obtain a urine specimen ?

## 2021-09-19 NOTE — ED Triage Notes (Signed)
Patient states that she awoke with c/o headache, not feeling right and not thinking straight. Patient answers orientation questions appropriately. ?

## 2021-09-19 NOTE — ED Notes (Signed)
Provider at bedside to re-evaluate pt.

## 2021-09-19 NOTE — ED Provider Notes (Signed)
8:14 AM Assumed care for off going team.  ? ?Blood pressure (!) 170/82, pulse 80, temperature 98 ?F (36.7 ?C), temperature source Oral, resp. rate 20, height 5\' 11"  (1.803 m), weight 54.4 kg, SpO2 94 %. ? ?See their HPI for full report but in brief pendng trop/MRI and possible dc if negative.  ? ? ?Repeat troponin is negative ? ?MRV was negative ?MRI brain negative ? ? ?F[place]ater follow-up with her PCP for slightly abnormal thyroid testing. ? ?Evaluated patient and she is feeling better.  Heart rate and blood pressure remained much better after patient had received metoprolol.  Patient is to follow-up with cardiologist on Monday and keeping a record of her heart rate and blood pressures to decide if she needs to go up on her metoprolol.  She reports near resolution of symptoms, no blurred vision, no chest pain, no abdominal pain.  She feels comfortable with being discharged home at this time ? ?I discussed the provisional nature of ED diagnosis, the treatment so far, the ongoing plan of care, follow up appointments and return precautions with the patient and any family or support people present. They expressed understanding and agreed with the plan, discharged home. ? ? ? ? ? ?  ?Vanessa [place], MD ?09/19/21 0825 ? ?

## 2021-09-20 LAB — URINE CULTURE: Culture: <10000 — AB

## 2021-09-21 ENCOUNTER — Other Ambulatory Visit: Payer: Self-pay

## 2021-09-21 ENCOUNTER — Observation Stay
Admission: EM | Admit: 2021-09-21 | Discharge: 2021-09-23 | Disposition: A | Discharge status: Home / Self Care | Payer: Medicare Other | Attending: Internal Medicine | Admitting: Internal Medicine

## 2021-09-21 ENCOUNTER — Emergency Department: Payer: Medicare Other

## 2021-09-21 DIAGNOSIS — R600 Localized edema: Secondary | ICD-10-CM | POA: Diagnosis present

## 2021-09-21 DIAGNOSIS — I160 Hypertensive urgency: Secondary | ICD-10-CM | POA: Diagnosis not present

## 2021-09-21 DIAGNOSIS — E43 Unspecified severe protein-calorie malnutrition: Secondary | ICD-10-CM | POA: Insufficient documentation

## 2021-09-21 DIAGNOSIS — T50901A Poisoning by unspecified drugs, medicaments and biological substances, accidental (unintentional), initial encounter: Secondary | ICD-10-CM | POA: Diagnosis present

## 2021-09-21 DIAGNOSIS — E039 Hypothyroidism, unspecified: Secondary | ICD-10-CM | POA: Diagnosis not present

## 2021-09-21 DIAGNOSIS — Z8616 Personal history of COVID-19: Secondary | ICD-10-CM | POA: Diagnosis not present

## 2021-09-21 DIAGNOSIS — R7989 Other specified abnormal findings of blood chemistry: Secondary | ICD-10-CM | POA: Insufficient documentation

## 2021-09-21 DIAGNOSIS — G4489 Other headache syndrome: Secondary | ICD-10-CM | POA: Diagnosis not present

## 2021-09-21 DIAGNOSIS — J479 Bronchiectasis, uncomplicated: Secondary | ICD-10-CM | POA: Diagnosis not present

## 2021-09-21 DIAGNOSIS — Z79899 Other long term (current) drug therapy: Secondary | ICD-10-CM | POA: Diagnosis not present

## 2021-09-21 DIAGNOSIS — R9431 Abnormal electrocardiogram [ECG] [EKG]: Secondary | ICD-10-CM | POA: Insufficient documentation

## 2021-09-21 DIAGNOSIS — Z7901 Long term (current) use of anticoagulants: Secondary | ICD-10-CM | POA: Diagnosis not present

## 2021-09-21 DIAGNOSIS — R0600 Dyspnea, unspecified: Secondary | ICD-10-CM | POA: Diagnosis not present

## 2021-09-21 DIAGNOSIS — G9389 Other specified disorders of brain: Secondary | ICD-10-CM | POA: Insufficient documentation

## 2021-09-21 DIAGNOSIS — R531 Weakness: Secondary | ICD-10-CM | POA: Diagnosis not present

## 2021-09-21 DIAGNOSIS — Z20822 Contact with and (suspected) exposure to covid-19: Secondary | ICD-10-CM | POA: Insufficient documentation

## 2021-09-21 DIAGNOSIS — Z8249 Family history of ischemic heart disease and other diseases of the circulatory system: Secondary | ICD-10-CM | POA: Insufficient documentation

## 2021-09-21 DIAGNOSIS — J8410 Pulmonary fibrosis, unspecified: Secondary | ICD-10-CM | POA: Insufficient documentation

## 2021-09-21 DIAGNOSIS — Z950 Presence of cardiac pacemaker: Secondary | ICD-10-CM | POA: Insufficient documentation

## 2021-09-21 DIAGNOSIS — Z8701 Personal history of pneumonia (recurrent): Secondary | ICD-10-CM | POA: Diagnosis not present

## 2021-09-21 DIAGNOSIS — I119 Hypertensive heart disease without heart failure: Secondary | ICD-10-CM | POA: Diagnosis not present

## 2021-09-21 DIAGNOSIS — Z79890 Hormone replacement therapy (postmenopausal): Secondary | ICD-10-CM | POA: Insufficient documentation

## 2021-09-21 DIAGNOSIS — R0989 Other specified symptoms and signs involving the circulatory and respiratory systems: Secondary | ICD-10-CM | POA: Insufficient documentation

## 2021-09-21 DIAGNOSIS — R519 Headache, unspecified: Secondary | ICD-10-CM | POA: Insufficient documentation

## 2021-09-21 DIAGNOSIS — I161 Hypertensive emergency: Secondary | ICD-10-CM | POA: Diagnosis not present

## 2021-09-21 DIAGNOSIS — Z713 Dietary counseling and surveillance: Secondary | ICD-10-CM | POA: Insufficient documentation

## 2021-09-21 DIAGNOSIS — E871 Hypo-osmolality and hyponatremia: Secondary | ICD-10-CM | POA: Diagnosis not present

## 2021-09-21 DIAGNOSIS — Z79891 Long term (current) use of opiate analgesic: Secondary | ICD-10-CM | POA: Diagnosis not present

## 2021-09-21 DIAGNOSIS — R42 Dizziness and giddiness: Secondary | ICD-10-CM | POA: Insufficient documentation

## 2021-09-21 DIAGNOSIS — I480 Paroxysmal atrial fibrillation: Secondary | ICD-10-CM | POA: Diagnosis not present

## 2021-09-21 DIAGNOSIS — Z87891 Personal history of nicotine dependence: Secondary | ICD-10-CM | POA: Diagnosis not present

## 2021-09-21 DIAGNOSIS — I4819 Other persistent atrial fibrillation: Secondary | ICD-10-CM | POA: Diagnosis present

## 2021-09-21 DIAGNOSIS — I159 Secondary hypertension, unspecified: Secondary | ICD-10-CM | POA: Insufficient documentation

## 2021-09-21 DIAGNOSIS — I081 Rheumatic disorders of both mitral and tricuspid valves: Secondary | ICD-10-CM | POA: Diagnosis not present

## 2021-09-21 DIAGNOSIS — R000 Tachycardia, unspecified: Secondary | ICD-10-CM | POA: Insufficient documentation

## 2021-09-21 DIAGNOSIS — E440 Moderate protein-calorie malnutrition: Secondary | ICD-10-CM | POA: Diagnosis present

## 2021-09-21 DIAGNOSIS — I10 Essential (primary) hypertension: Secondary | ICD-10-CM | POA: Diagnosis present

## 2021-09-21 DIAGNOSIS — Z681 Body mass index (BMI) 19 or less, adult: Secondary | ICD-10-CM | POA: Insufficient documentation

## 2021-09-21 DIAGNOSIS — I6782 Cerebral ischemia: Secondary | ICD-10-CM | POA: Insufficient documentation

## 2021-09-21 DIAGNOSIS — M7989 Other specified soft tissue disorders: Secondary | ICD-10-CM | POA: Insufficient documentation

## 2021-09-21 LAB — PHOSPHORUS: Phosphorus: 2.9 mg/dL (ref 2.5–4.6)

## 2021-09-21 LAB — BASIC METABOLIC PANEL
Anion gap: 8 (ref 5–15)
BUN: 18 mg/dL (ref 8–23)
CO2: 31 mmol/L (ref 22–32)
Calcium: 9.1 mg/dL (ref 8.9–10.3)
Chloride: 92 mmol/L — ABNORMAL LOW (ref 98–111)
Creatinine, Ser: 0.62 mg/dL (ref 0.44–1.00)
GFR, Estimated: >60 mL/min (ref >60)
Glucose, Bld: 99 mg/dL (ref 70–99)
Potassium: 4.3 mmol/L (ref 3.5–5.1)
Sodium: 131 mmol/L — ABNORMAL LOW (ref 135–145)

## 2021-09-21 LAB — CBC
HCT: 39.6 % (ref 36.0–46.0)
Hemoglobin: 12.8 g/dL (ref 12.0–15.0)
MCH: 30 pg (ref 26.0–34.0)
MCHC: 32.3 g/dL (ref 30.0–36.0)
MCV: 93 fL (ref 80.0–100.0)
Platelets: 256 10*3/uL (ref 150–400)
RBC: 4.26 MIL/uL (ref 3.87–5.11)
RDW: 13.5 % (ref 11.5–15.5)
WBC: 4.9 10*3/uL (ref 4.0–10.5)
nRBC: 0 % (ref 0.0–0.2)

## 2021-09-21 LAB — HEPATIC FUNCTION PANEL
ALT: 29 U/L (ref 0–44)
AST: 35 U/L (ref 15–41)
Albumin: 3.7 g/dL (ref 3.5–5.0)
Alkaline Phosphatase: 50 U/L (ref 38–126)
Bilirubin, Direct: 0.1 mg/dL (ref 0.0–0.2)
Indirect Bilirubin: 0.3 mg/dL (ref 0.3–0.9)
Total Bilirubin: 0.4 mg/dL (ref 0.3–1.2)
Total Protein: 7 g/dL (ref 6.5–8.1)

## 2021-09-21 LAB — RESP PANEL BY RT-PCR (FLU A&B, COVID) ARPGX2
Influenza A by PCR: NEGATIVE
Influenza B by PCR: NEGATIVE
SARS Coronavirus 2 by RT PCR: NEGATIVE

## 2021-09-21 LAB — BRAIN NATRIURETIC PEPTIDE: B Natriuretic Peptide: 166.8 pg/mL — ABNORMAL HIGH (ref 0.0–100.0)

## 2021-09-21 LAB — TROPONIN I (HIGH SENSITIVITY)
Troponin I (High Sensitivity): 7 ng/L (ref <18)
Troponin I (High Sensitivity): 7 ng/L (ref <18)

## 2021-09-21 LAB — MAGNESIUM: Magnesium: 2 mg/dL (ref 1.7–2.4)

## 2021-09-21 IMAGING — CT CT HEAD W/O CM
4 series · 17 of 47 positions shown, 19 images · non-contrast
Comparison: [DATE]

CLINICAL DATA: Headache and dizziness all weekend, hypertension,
tachycardia, history of atrial fibrillation



[Series 2: head bone · axial · 0.43mm/px · z∈[-55,+1]mm · 4 of 79 slices shown]
[im 8/79  bone]
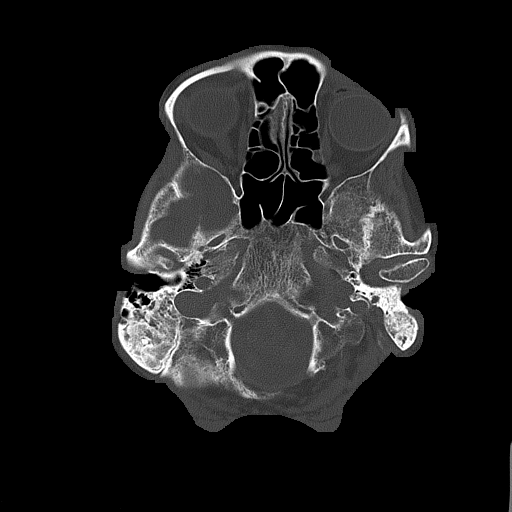
[im 16/79  bone]
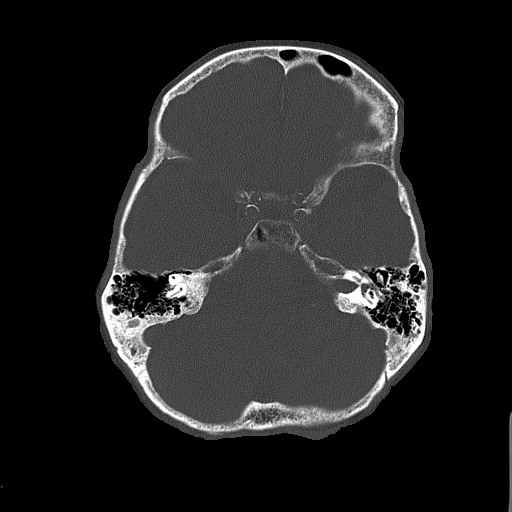
[im 24/79  bone]
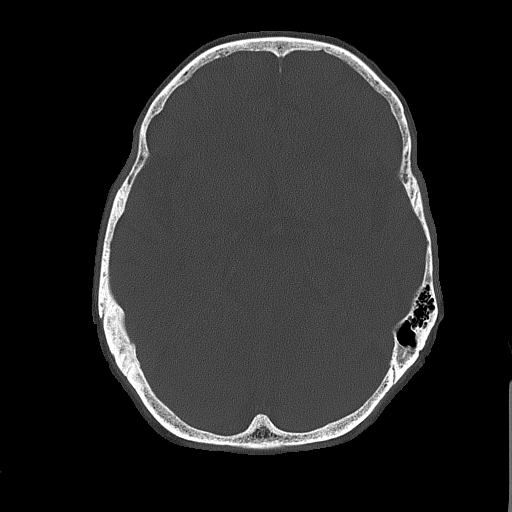
[im 36/79  bone]
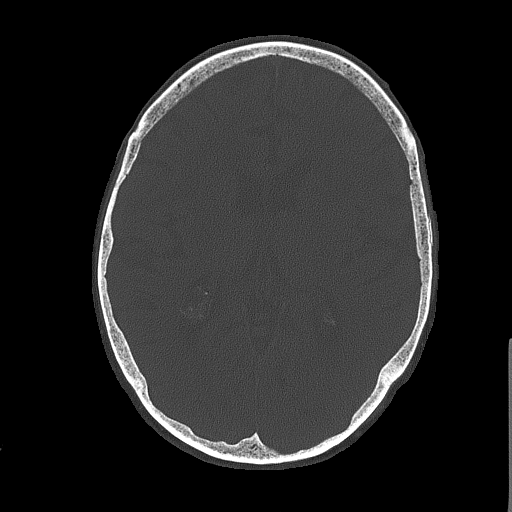

[Series 3: head wo · axial · 0.43mm/px · z∈[-54,+66]mm · 7 of 32 slices shown, 9 images]
[im 4/32  brain]
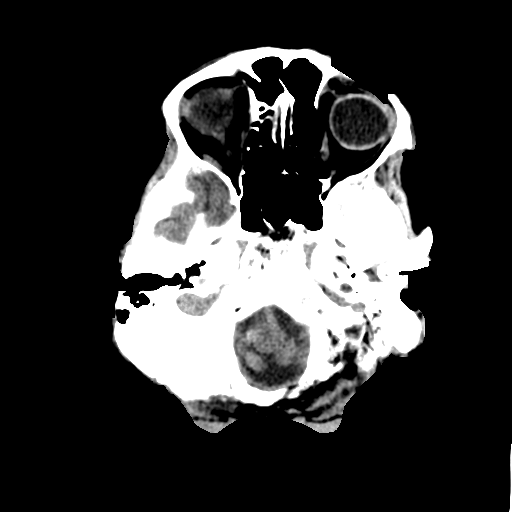
[im 4/32  bone]
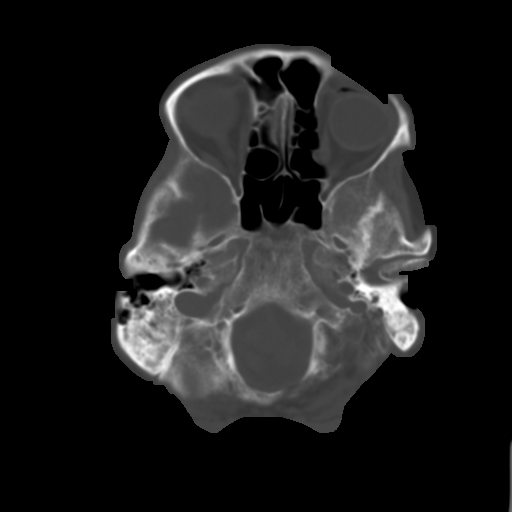
[im 8/32  brain]
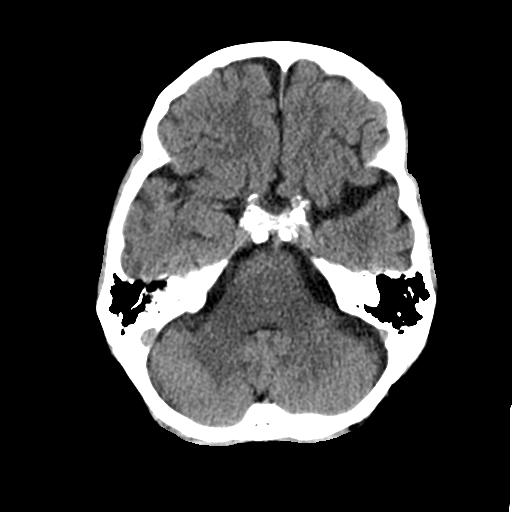
[im 12/32  brain]
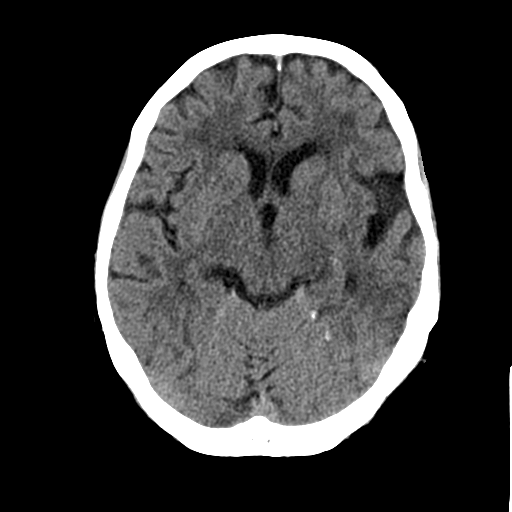
[im 16/32  brain]
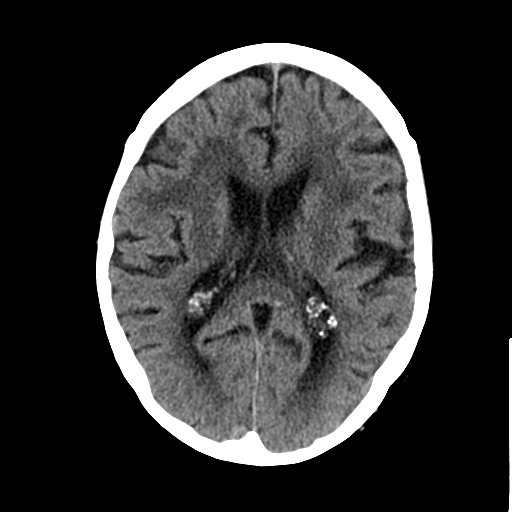
[im 20/32  brain]
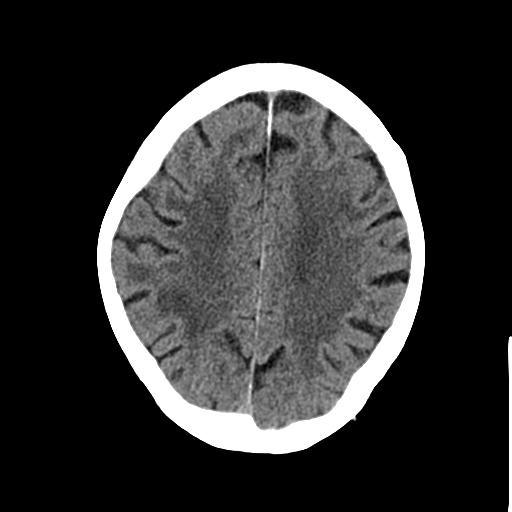
[im 20/32  bone]
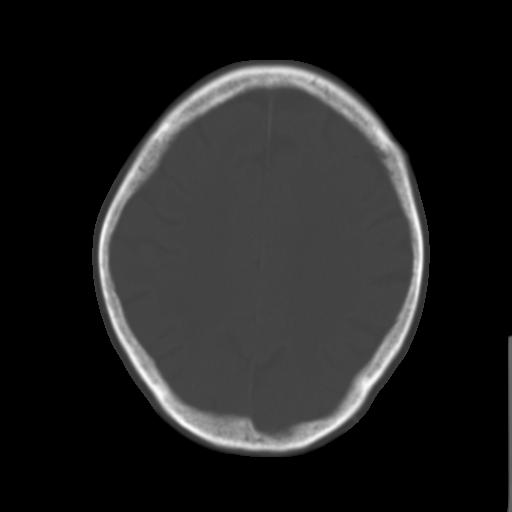
[im 24/32  brain]
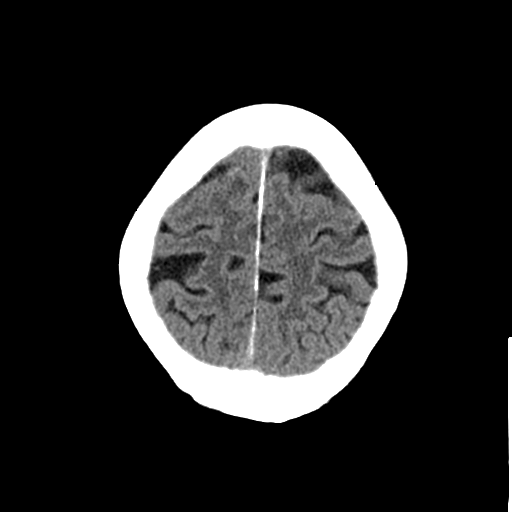
[im 28/32  brain]
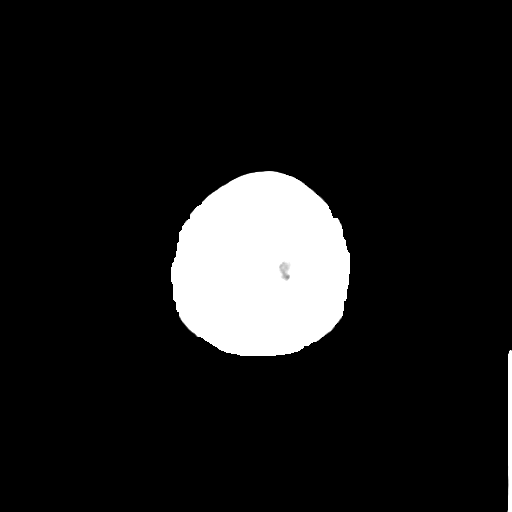

[Series 4: coronal soft tissue · coronal · 0.32mm/px · 3 of 66 slices shown]
[im 22/66  brain]
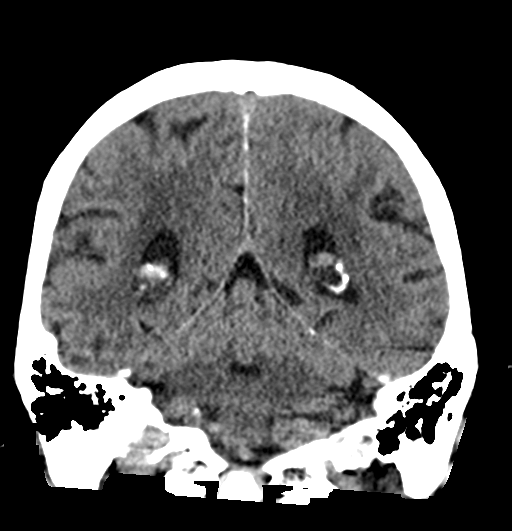
[im 29/66  brain]
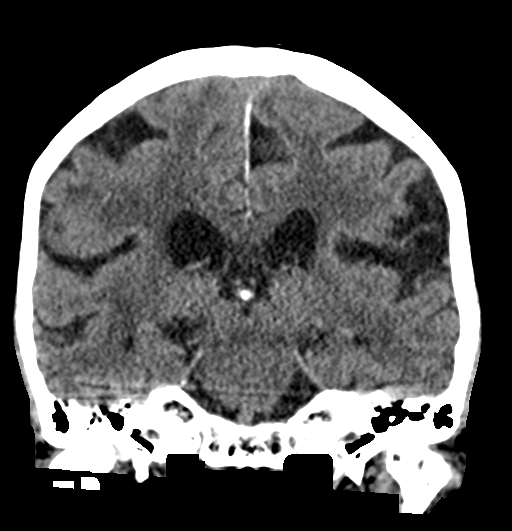
[im 37/66  brain]
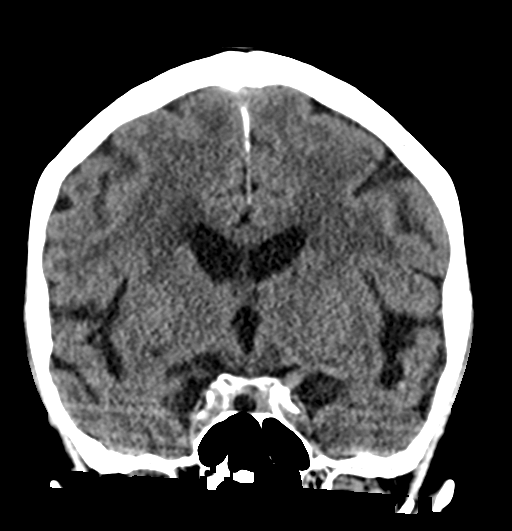

[Series 5: sagittal soft tissue · sagittal · 0.33mm/px · 3 of 55 slices shown]
[im 19/55  brain]
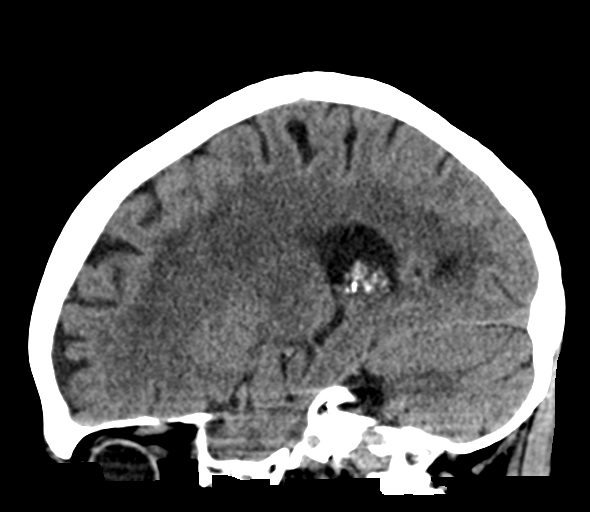
[im 28/55  brain]
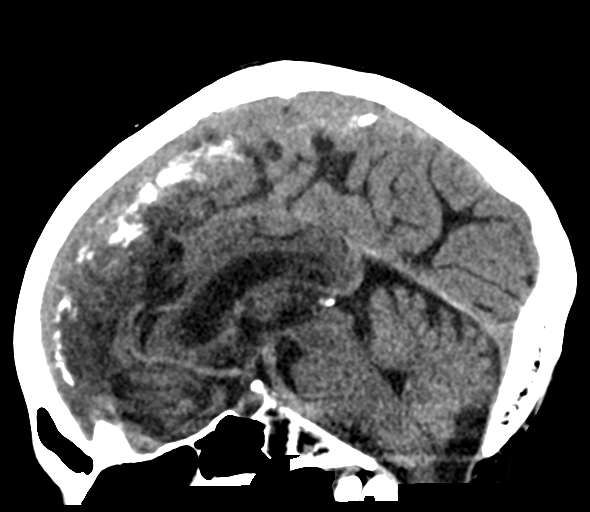
[im 37/55  brain]
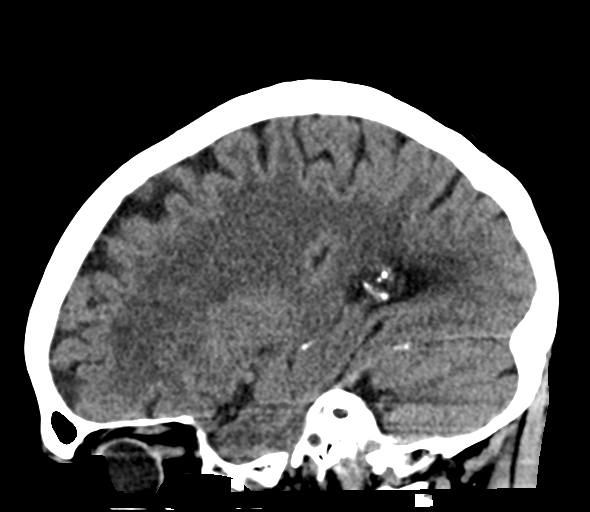

[17 of 47 positions shown; findings below may reference images not displayed]

FINDINGS: Brain: Stable confluent hypodensities throughout the periventricular
and subcortical white matter consistent with chronic small vessel
ischemic change. No evidence of acute infarct or hemorrhage. Lateral
ventricles and midline structures are stable. No acute extra-axial
fluid collections. No mass effect.

Vascular: Stable atherosclerosis.  No hyperdense vessel.

Skull: Normal. Negative for fracture or focal lesion.

Sinuses/Orbits: No acute finding.

Other: None.
IMPRESSION: 1. Stable head CT, no acute intracranial process.

## 2021-09-21 IMAGING — CR DG CHEST 2V
2 series · 2 of 2 positions shown · non-contrast
Comparison: Chest x-ray dated [DATE]

CLINICAL DATA: high blood pressure

EXAM:
CHEST - 2 VIEW

[chest pa]
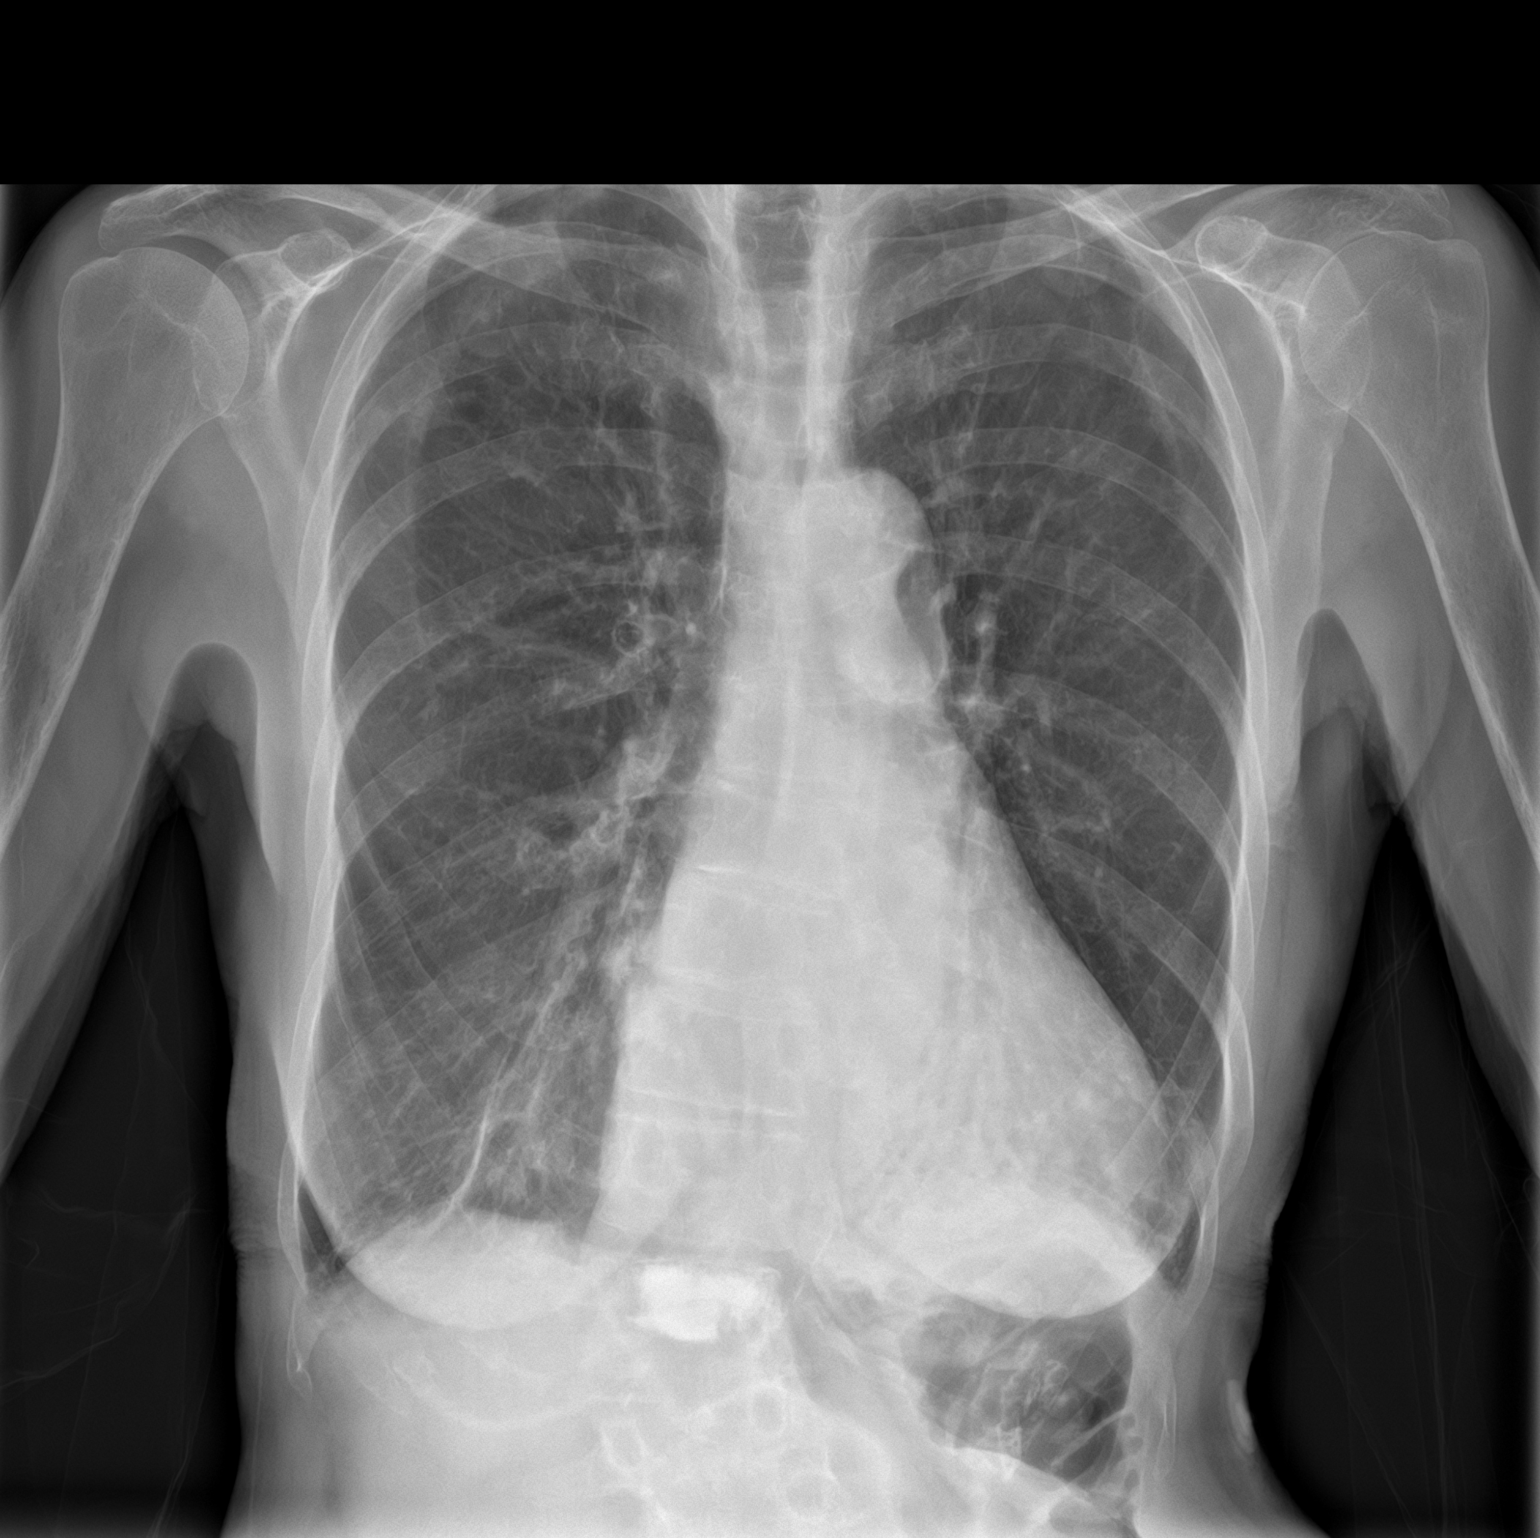

[chest lat]
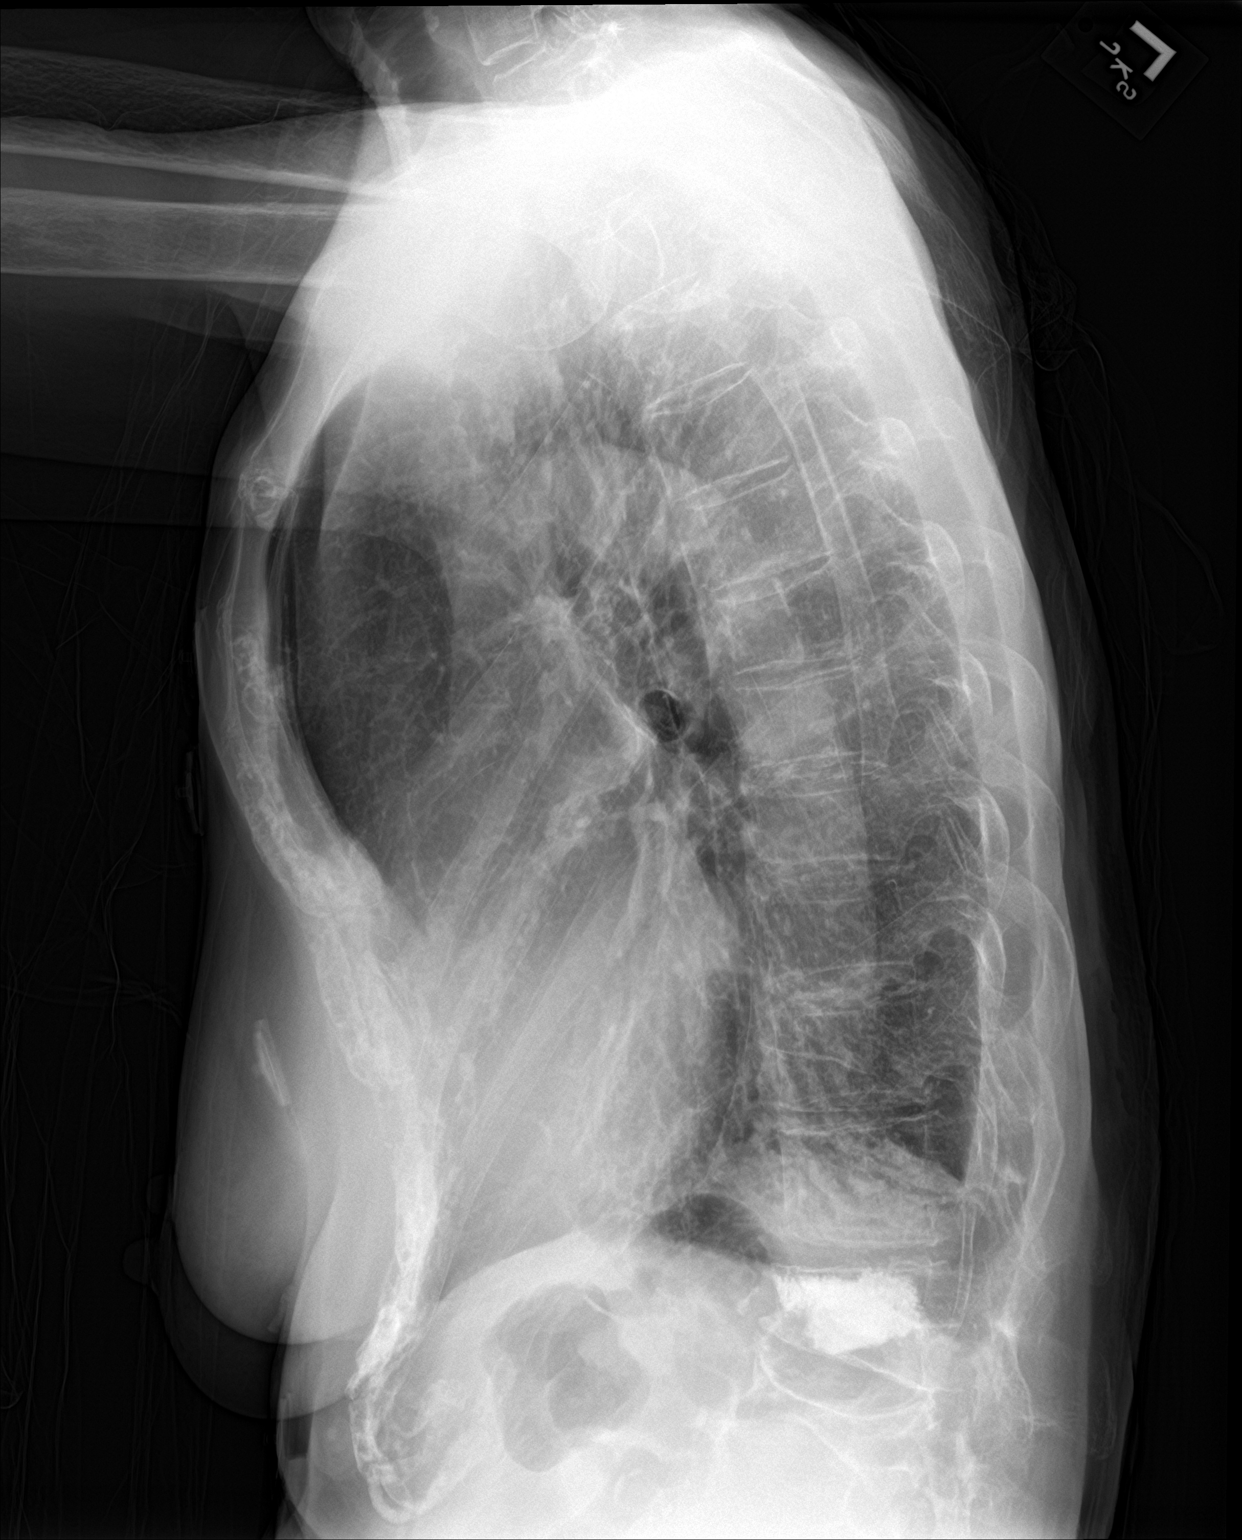

[2 of 2 positions shown; findings below may reference images not displayed]

FINDINGS: Cardiac and mediastinal contours are unchanged. Bibasilar left
greater than right opacities, unchanged compared to prior exam and
likely due to bronchiectasis. No new parenchymal opacity. No pleural
effusion or pneumothorax.
IMPRESSION: No acute cardiopulmonary disease.

## 2021-09-21 MED ORDER — RISAQUAD PO CAPS
1.0000 | ORAL_CAPSULE | Freq: Every day | ORAL | Status: DC
  Administered 2021-09-22 – 2021-09-23 (×2): 1 via ORAL
  Filled 2021-09-21 (×2): qty 1

## 2021-09-21 MED ORDER — ONDANSETRON HCL 4 MG PO TABS: 4.0000 mg | ORAL_TABLET | Freq: Four times a day (QID) | ORAL | Status: DC | PRN

## 2021-09-21 MED ORDER — RIVAROXABAN 20 MG PO TABS: 20.0000 mg | ORAL_TABLET | Freq: Every day | ORAL | Status: DC

## 2021-09-21 MED ORDER — ASCORBIC ACID 500 MG PO TABS
500.0000 mg | ORAL_TABLET | Freq: Every day | ORAL | Status: DC
  Administered 2021-09-22 – 2021-09-23 (×2): 500 mg via ORAL
  Filled 2021-09-21 (×2): qty 1

## 2021-09-21 MED ORDER — HYDRALAZINE HCL 10 MG PO TABS
10.0000 mg | ORAL_TABLET | Freq: Four times a day (QID) | ORAL | Status: AC | PRN
Start: 2021-09-21 — End: 2021-09-22
  Filled 2021-09-21 (×2): qty 1

## 2021-09-21 MED ORDER — ACETAMINOPHEN 325 MG PO TABS
650.0000 mg | ORAL_TABLET | Freq: Four times a day (QID) | ORAL | Status: DC | PRN
  Administered 2021-09-22: 650 mg via ORAL
  Filled 2021-09-21: qty 2

## 2021-09-21 MED ORDER — ONDANSETRON HCL 4 MG/2ML IJ SOLN: 4.0000 mg | Freq: Four times a day (QID) | INTRAMUSCULAR | Status: DC | PRN

## 2021-09-21 MED ORDER — METOPROLOL SUCCINATE ER 50 MG PO TB24
50.0000 mg | ORAL_TABLET | Freq: Every morning | ORAL | Status: DC
  Administered 2021-09-22 – 2021-09-23 (×2): 50 mg via ORAL
  Filled 2021-09-21 (×2): qty 1

## 2021-09-21 MED ORDER — LORAZEPAM 0.5 MG PO TABS
0.5000 mg | ORAL_TABLET | Freq: Every evening | ORAL | Status: DC | PRN
  Administered 2021-09-21: 0.5 mg via ORAL
  Filled 2021-09-21: qty 1

## 2021-09-21 MED ORDER — LEVOTHYROXINE SODIUM 50 MCG PO TABS
75.0000 ug | ORAL_TABLET | Freq: Every day | ORAL | Status: DC
  Administered 2021-09-22 – 2021-09-23 (×2): 75 ug via ORAL
  Filled 2021-09-21 (×2): qty 2

## 2021-09-21 MED ORDER — ACETAMINOPHEN 650 MG RE SUPP: 650.0000 mg | Freq: Four times a day (QID) | RECTAL | Status: DC | PRN

## 2021-09-21 MED ORDER — ALBUTEROL SULFATE (2.5 MG/3ML) 0.083% IN NEBU: 2.5000 mg | INHALATION_SOLUTION | Freq: Four times a day (QID) | RESPIRATORY_TRACT | Status: DC

## 2021-09-21 MED ORDER — METOPROLOL SUCCINATE ER 25 MG PO TB24
25.0000 mg | ORAL_TABLET | Freq: Every day | ORAL | Status: DC
  Administered 2021-09-21 – 2021-09-22 (×2): 25 mg via ORAL
  Filled 2021-09-21 (×2): qty 1

## 2021-09-21 MED ORDER — ACETAMINOPHEN 500 MG PO TABS
1000.0000 mg | ORAL_TABLET | Freq: Once | ORAL | Status: AC
  Administered 2021-09-21: 1000 mg via ORAL
  Filled 2021-09-21: qty 2

## 2021-09-21 MED ORDER — RIVAROXABAN 15 MG PO TABS
15.0000 mg | ORAL_TABLET | Freq: Every day | ORAL | Status: DC
  Filled 2021-09-21: qty 1

## 2021-09-21 MED ORDER — AMLODIPINE BESYLATE 5 MG PO TABS
5.0000 mg | ORAL_TABLET | Freq: Once | ORAL | Status: AC
  Administered 2021-09-21: 5 mg via ORAL
  Filled 2021-09-21: qty 1

## 2021-09-21 MED ORDER — ADULT MULTIVITAMIN W/MINERALS CH
1.0000 | ORAL_TABLET | Freq: Every day | ORAL | Status: DC
  Administered 2021-09-22 – 2021-09-23 (×2): 1 via ORAL
  Filled 2021-09-21 (×2): qty 1

## 2021-09-21 MED ORDER — AMLODIPINE BESYLATE 5 MG PO TABS
5.0000 mg | ORAL_TABLET | Freq: Every day | ORAL | Status: AC
  Administered 2021-09-22: 5 mg via ORAL
  Filled 2021-09-21: qty 1

## 2021-09-21 NOTE — Assessment & Plan Note (Addendum)
-   home Xarelto 20 mg daily, was resumed at Xarelto 15 mg daily due to patient's current creatinine clearance ?- Patient is prescribed metoprolol succinate 50 mg daily, she currently takes it twice a day, 50 mg in the morning and 25 mg in the evening ?

## 2021-09-21 NOTE — Assessment & Plan Note (Addendum)
-   Dietitian has been consulted ?

## 2021-09-21 NOTE — ED Triage Notes (Addendum)
Pt to ED via POV. Pt states she had a HA and dizziness all weekend and was seen Friday for it. Pt went to seen Dr. Cleda Mccreedy today and was sent over here due to her BP being dangerously high and her HR being high. Pt hx afib. Pt denies CP or SOB. Pt BP 198/82 ?

## 2021-09-21 NOTE — Progress Notes (Incomplete)
Pt up to floor at approx 2000. A&O x4 but reporting "brain fog". Reports feeling forgetful. No pain overnight. Admission completed. Oriented to room and use of call bell w/ verbalized understanding. Aware and agreeable of plan. Medicated per MAR. VSS and assessment per flowsheets. Ambulating steadily around the room. Call bell within reach. Comfort and safety maintained.

## 2021-09-21 NOTE — ED Notes (Signed)
Pt at CT

## 2021-09-21 NOTE — ED Notes (Signed)
Hospital provider at bedside

## 2021-09-21 NOTE — Hospital Course (Signed)
[name] is a 87 year old female with history of hypothyroid, hypertension, history of COVID-19 infection in December 2022, atrial fibrillation currently on Xarelto and metoprolol, presents emergency department for chief concerns of headache, and hypertensive urgency from PCPs office. ? ?Patient was seen in the emergency department on 09/19/2021, for similar complaints and was discharged from the emergency department.  Patient presented as a emergency department follow-up with her PCP doctor, Dr. Caryl Comes, and per documentation her SBP outpatient was 216 and she had word finding difficulties, prompting him to send her back to the emergency department for further evaluation. ? ?In the emergency department documented blood pressure was 198/82, improved to 171/98, temperature of 98.5, respiration rate of 20, heart rate of 85, SPO2 was 96% on room air. ? ? ?Serum sodium was 131, potassium 4.3, chloride 92, bicarb 31, BUN of 18, serum creatinine of 0.62, nonfasting blood glucose 99, GFR greater than 60, troponin was 11, WBC of 4.9, hemoglobin 12.8, platelets of 250. ? ?ED treatment: Patient received amlodipine 5 mg p.o. one-time dose. ?

## 2021-09-21 NOTE — ED Provider Notes (Signed)
? ?[facility] ?Provider Note ? ? ? Event Date/Time  ? First MD Initiated Contact with Patient 09/21/21 1641   ?  (approximate) ? ? ?History  ? ?Hypertension ? ? ?HPI ? ?Kaylee Santiago is a 87 y.o. female  HPI ?Kaylee Santiago is a 87 y.o. female with hypertension, A-fib, hypothyroidism, COVID in December 2022 who comes in with concerns for elevated blood pressure.  Patient was seen here on 3/11 and had full MRIs that were negative and patient was discharged home.  Patient reports having continued global headache with word finding difficulty and confusion with repeat blood pressure of 216 at her primary care doctor so he sent her back to the emergency room to be evaluated.  Patient reports that she had gone up on her metoprolol 50 mg twice daily.  ? ?Patient reports that she has not been taking amlodipine due to volume her pressures out previously.  She does report taking an extra dose of metoprolol and a half of a Vicodin to help with her headache.  She reports her headaches and brain fog been going on since COVID she reports that she thinks she has COVID brain.  She denies been getting any worse over the past few days.  She had MRIs that were -2 days ago and she reports that her symptoms are very similar and unchanged still not going away.  She denies taking any Tylenol.  She states that she does not have any chest pain, shortness of breath, abdominal pain, leg swelling or any other concerns. ? ? ?Physical Exam  ? ?Triage Vital Signs: ?ED Triage Vitals  ?Enc Vitals Group  ?   BP 09/21/21 1628 (!) 198/82  ?   Pulse Rate 09/21/21 1628 85  ?   Resp 09/21/21 1628 20  ?   Temp 09/21/21 1628 98.5 ?F (36.9 ?C)  ?   Temp Source 09/21/21 1628 Oral  ?   SpO2 09/21/21 1628 96 %  ?   Weight 09/21/21 1625 120 lb (54.4 kg)  ?   Height 09/21/21 1625 5\' 11"  (1.803 m)  ?   Head Circumference --   ?   Peak Flow --   ?   Pain Score 09/21/21 1625 0  ?   Pain Loc --   ?   Pain Edu? --   ?   Excl. in [place]? --   ? ? ?Most  recent vital signs: ?Vitals:  ? 09/21/21 1628  ?BP: (!) 198/82  ?Pulse: 85  ?Resp: 20  ?Temp: 98.5 ?F (36.9 ?C)  ?SpO2: 96%  ? ? ? ?General: Awake, no distress.  ?CV:  Good peripheral perfusion.  Irregular ?Resp:  Normal effort.  ?Abd:  No distention.  ?Other:  Equal strength in arms and legs.  Cranial nerves are intact.  Patient is very well-appearing speaking in full sentences does not seem to be in any distress ? ? ?ED Results / Procedures / Treatments  ? ?Labs ?(all labs ordered are listed, but only abnormal results are displayed) ?Labs Reviewed  ?BASIC METABOLIC PANEL - Abnormal; Notable for the following components:  ?    Result Value  ? Sodium 131 (*)   ? Chloride 92 (*)   ? All other components within normal limits  ?RESP PANEL BY RT-PCR (FLU A&B, COVID) [id number]  ?CBC  ?HEPATIC FUNCTION PANEL  ?TROPONIN I (HIGH SENSITIVITY)  ?TROPONIN I (HIGH SENSITIVITY)  ? ? ? ?EKG ? ?My interpretation of EKG: ? ?Atrial fibrillation rate of  84 without any ST elevation or T wave inversions, normal intervals ? ?RADIOLOGY ?I have reviewed the xray personally no ICH ? ?PROCEDURES: ? ?Critical Care performed: No ? ?.1-3 Lead EKG Interpretation ?Performed by: Vanessa Solen, MD ?Authorized by: Vanessa [place], MD  ? ?  Interpretation: normal   ?  ECG rate:  80 ?  ECG rate assessment: normal   ?  Rhythm: sinus rhythm   ?  Ectopy: none   ?  Conduction: normal   ? ? ?MEDICATIONS ORDERED IN ED: ?Medications  ?amLODipine (NORVASC) tablet 5 mg (has no administration in time range)  ? ? ? ?IMPRESSION / MDM / ASSESSMENT AND PLAN / ED COURSE  ?I reviewed the triage vital signs and the nursing notes. ?             ?               ? ?Patient comes in with brain fog and headaches ever since having COVID with negative MRIs 2 days ago with some continued slightly elevated blood pressures.  Her symptoms have been unchanged over the past 2 days.  We will get repeat labs, CT head evaluate for intercranial hemorrhage but seems less likely given  normal neuro exam.  We will give some Tylenol and given patient's not been taking her amlodipine we will give her 5 mg of amlodipine. ? ?Hepatic function normal.  Sodium, chloride slightly low.  CBC normal.  Troponin negative ? ?5:07 PM Discussed with Dr. Caryl Comes who knows patient very well and he states that typically patient is very alert and oriented and that when he was evaluated here today he noticed that she was having a lot of difficulties with word finding and difficulties with processing and he was concerned that the blood pressure was causing some encephalopathy.  He would like patient to be admitted for observation to ensure that her blood pressures are staying below 180 consistently prior for discharge home ? ?Discussed with patient and family and they feel more comfortable with this plan as well. ? ?The patient is on the cardiac monitor to evaluate for evidence of arrhythmia and/or significant heart rate changes. ? ? ?FINAL CLINICAL IMPRESSION(S) / ED DIAGNOSES  ? ?Final diagnoses:  ?Hypertension, unspecified type  ?Hypertensive emergency  ? ? ? ?Rx / DC Orders  ? ?ED Discharge Orders   ? ? None  ? ?  ? ? ? ?Note:  This document was prepared using Dragon voice recognition software and may include unintentional dictation errors. ?  ?Vanessa [place], MD ?09/21/21 1840 ? ?

## 2021-09-21 NOTE — Assessment & Plan Note (Signed)
-   Resumed levothyroxine 75 mcg in the morning ?

## 2021-09-21 NOTE — Assessment & Plan Note (Addendum)
-   With persistent dizziness and "fogginess " ?- Unclear etiology at this time ?- Check B12, BNP ?- Patient did say that she intentionally stopped taking amlodipine due to it dropping her blood pressure too much ?- Hydralazine 10 mg every 6 hours as needed for SBP greater than 175, 1 day ordered ?

## 2021-09-21 NOTE — Assessment & Plan Note (Addendum)
-   Work-up in progress at this time, differentials include new heart failure exacerbation ?- BNP markedly elevated ?- Complete echo ordered, strict I's and O's ?- No clinical suspicion for DVT at this time as patient is compliant with her Xarelto ?- Staff message has been sent to Dr. Arnoldo Hooker, [facility] clinic cardiology for a routine consult for medication management and possible new onset heart failure exacerbation ?

## 2021-09-21 NOTE — Assessment & Plan Note (Signed)
-   Asymptomatic at this time ?- She has chronic hyponatremia, serum sodium range in the last 5 months has been 121-133 ?- BMP in a.m. ?

## 2021-09-21 NOTE — ED Notes (Signed)
Pt presents to ED with c/o of hypertension and being sent by PCP for further eval. Pt states she feels like she has had a HA since she had COVID in December. Pt just seen 2 days ago here for the same, pt denies any significant changes other than PCP sent her here for admission. Pt denies any weakness. Pt is A&Ox4 at this time.  ?

## 2021-09-21 NOTE — H&P (Signed)
History and Physical   Kaylee Santiago [id number] DOB: 11-26-1934 DOA: 09/21/2021  PCP: Adin Hector, MD  Outpatient Specialists: Dr. Sharlet Salina, physical medicine Patient coming from: [facility] clinic outpatient  I have personally briefly reviewed patient's old medical records in [place].  Chief Concern: Hypertension and headache  HPI: Kaylee Santiago is a 87 year old female with history of hypothyroid, hypertension, history of COVID-19 infection in December 2022, atrial fibrillation currently on Xarelto and metoprolol, presents emergency department for chief concerns of headache, and hypertensive urgency from PCPs office.  Patient was seen in the emergency department on 09/19/2021, for similar complaints and was discharged from the emergency department.  Patient presented as a emergency department follow-up with her PCP doctor, Dr. Caryl Comes, and per documentation her SBP outpatient was 216 and she had word finding difficulties, prompting him to send her back to the emergency department for further evaluation.  In the emergency department documented blood pressure was 198/82, improved to 171/98, temperature of 98.5, respiration rate of 20, heart rate of 85, SPO2 was 96% on room air.   Serum sodium was 131, potassium 4.3, chloride 92, bicarb 31, BUN of 18, serum creatinine of 0.62, nonfasting blood glucose 99, GFR greater than 60, troponin was 11, WBC of 4.9, hemoglobin 12.8, platelets of 250.  ED treatment: Patient received amlodipine 5 mg p.o. one-time dose.  At bedside, she is able to tell me her name, age, current location, and current calendar year.   She denies dysphagia, cough, chills, chest pain, shortness of breath, fever, nausea, vomiting, abdominal pain, dysuria, hematuria, diarrhea, blood in her stool, passing out or loss of consciousness.  She does endorse that her lower extremities has had increased swelling in the last few weeks.  She endorses persistent weakness  elevated blood pressure at home.  She has also stopped taking her amlodipine due to concerns of it dropping her blood pressure too low in the past.  Social history: She lives at home by herself. She denies tobacco, etoh, recreational drug use. She is retired and was formerly a Therapist, sports.   Vaccination history: She is vaccinated for covid 19 and influenza   ROS: Constitutional: no weight change, no fever ENT/Mouth: no sore throat, no rhinorrhea Eyes: no eye pain, no vision changes Cardiovascular: no chest pain, no dyspnea,  no edema, no palpitations Respiratory: no cough, no sputum, no wheezing Gastrointestinal: no nausea, no vomiting, no diarrhea, no constipation Genitourinary: no urinary incontinence, no dysuria, no hematuria Musculoskeletal: no arthralgias, no myalgias Skin: no skin lesions, no pruritus, Neuro: + weakness, no loss of consciousness, no syncope, + headache Psych: no anxiety, no depression, + decrease appetite Heme/Lymph: no bruising, no bleeding  ED Course: Discussed with emergency medicine provider, patient requiring hospitalization for chief concerns of hypertensive urgency.  Assessment/Plan  Principal Problem:   Hypertensive urgency Active Problems:   AF (paroxysmal atrial fibrillation) (HCC)   Hypothyroidism   Essential hypertension   Malnutrition of moderate degree   Hyponatremia   History of COVID-19   Lower extremity edema  Cardiovascular and Mediastinum * Hypertensive urgency Assessment & Plan - With persistent dizziness and "fogginess " - Unclear etiology at this time - Check B12, BNP - Patient did say that she intentionally stopped taking amlodipine due to it dropping her blood pressure too much - Hydralazine 10 mg every 6 hours as needed for SBP greater than 175, 1 day ordered   AF (paroxysmal atrial fibrillation) ([facility]) Assessment & Plan - home Xarelto  20 mg daily, was resumed at Xarelto 15 mg daily due to patient's current creatinine clearance -  Patient is prescribed metoprolol succinate 50 mg daily, she currently takes it twice a day, 50 mg in the morning and 25 mg in the evening  Endocrine Hypothyroidism Assessment & Plan - Resumed levothyroxine 75 mcg in the morning  Other Lower extremity edema Assessment & Plan - Work-up in progress at this time - BNP markedly elevated - Complete echo ordered - No clinical suspicion for DVT at this time as patient is compliant with her Xarelto  Hyponatremia Assessment & Plan - Asymptomatic at this time - She has chronic hyponatremia, serum sodium range in the last 5 months has been 121-133 - BMP in a.m.  Malnutrition of moderate degree Assessment & Plan - Dietitian has been consulted  Suspect new heart failure exacerbation-given elevated BNP and worsening lower extremity swelling - Strict I's and O's, complete echo ordered - Staff message sent to Dr. Nehemiah Massed for routine consultation and heart failure medication management  Chart reviewed.   DVT prophylaxis: Xarelto Code Status: Full code Diet: Heart healthy Family Communication: Updated her niece is at bedside with patient's permission Disposition Plan: Pending clinical course Consults called: None at this time Admission status: Telemetry cardiac, observation  Past Medical History:  Diagnosis Date   Atrial fibrillation ([place])    Atrial fibrillation ([place])    Back pain    Dysrhythmia    Hypertension    Hypothyroidism    Past Surgical History:  Procedure Laterality Date   ABDOMINAL HYSTERECTOMY     BREAST EXCISIONAL BIOPSY Right 70's   CATARACT EXTRACTION W/ INTRAOCULAR LENS  IMPLANT, BILATERAL     KYPHOPLASTY N/A 12/21/2018   Procedure: KYPHOPLASTY L1;  Surgeon: Hessie Knows, MD;  Location: [facility] ORS;  Service: Orthopedics;  Laterality: N/A;   Social History:  reports that she has quit smoking. Her smoking use included cigarettes. She has never used smokeless tobacco. She reports that she does not currently use  alcohol. She reports that she does not use drugs.  Allergies  Allergen Reactions   Amoxicillin-Pot Clavulanate Diarrhea   Levofloxacin Diarrhea   Trazodone Other (See Comments)    nightmares   Family History  Problem Relation Age of Onset   Breast cancer Sister 47   Breast cancer Sister        36's   CAD Mother    Family history: Family history reviewed and not pertinent  Prior to Admission medications   Medication Sig Start Date End Date Taking? Authorizing Provider  albuterol (VENTOLIN HFA) 108 (90 Base) MCG/ACT inhaler Inhale 2 puffs into the lungs every 6 (six) hours. 07/04/21   Loletha Grayer, MD  amLODipine (NORVASC) 2.5 MG tablet Take 1 tablet (2.5 mg total) by mouth daily. 07/04/21   Loletha Grayer, MD  ascorbic acid (VITAMIN C) 500 MG tablet Take 1 tablet (500 mg total) by mouth daily. 07/05/21   Loletha Grayer, MD  Biotin 5000 MCG TABS Take 5,000 mcg by mouth daily.    [provider]  Calcium Carb-Cholecalciferol (CALTRATE 600+D3 PO) Take 1 tablet by mouth daily.    [provider]  LAGEVRIO 200 MG CAPS capsule Take 4 capsules by mouth 2 (two) times daily. Patient not taking: Reported on 09/19/2021 07/01/21   [provider]  levothyroxine (SYNTHROID) 75 MCG tablet Take 1 tablet by mouth daily. 03/03/21   [provider]  loratadine (CLARITIN) 10 MG tablet Take 10 mg by mouth daily. 07/09/21  [provider]  LORazepam (ATIVAN) 0.5 MG tablet Take 0.5 mg by mouth at bedtime as needed for sleep.    [provider]  metoprolol succinate (TOPROL-XL) 50 MG 24 hr tablet Take 50 mg by mouth in the morning and at bedtime. Take 50 mg in the morning and 25 mg in the evening 12/26/17   [provider]  Multiple Vitamin (MULTI-VITAMIN DAILY PO) Take 1 tablet by mouth daily.    [provider]  predniSONE (DELTASONE) 10 MG tablet 4 tabs po daily for three days Patient not taking: Reported on 09/19/2021 07/05/21    Loletha Grayer, MD  Saccharomyces boulardii (PROBIOTIC) 250 MG CAPS Take 1 capsule by mouth daily.    [provider]  vitamin E 400 UNIT capsule Take 400 Units by mouth daily.    [provider]  XARELTO 20 MG TABS tablet Take 20 mg by mouth daily. 05/12/21   [provider]  zinc sulfate 220 (50 Zn) MG capsule Take 1 capsule (220 mg total) by mouth daily. 07/05/21   Loletha Grayer, MD   Physical Exam: Vitals:   09/21/21 1959 09/21/21 2018 09/21/21 2136 09/21/21 2204  BP:    (!) 167/92  Pulse:    70  Resp:    16  Temp: 98 F (36.7 C)  97.8 F (36.6 C) 97.7 F (36.5 C)  TempSrc: Oral  Oral Oral  SpO2:    97%  Weight:  53 kg    Height:  5\' 10"  (1.778 m)     Constitutional: appears younger than chronological age, malnourished appearing, NAD, calm, comfortable Eyes: PERRL, lids and conjunctivae normal ENMT: Mucous membranes are moist. Posterior pharynx clear of any exudate or lesions. Age-appropriate dentition. Hearing appropriate Neck: normal, supple, no masses, no thyromegaly Respiratory: clear to auscultation bilaterally, no wheezing, no crackles. Normal respiratory effort. No accessory muscle use.  Cardiovascular: Regular rate and rhythm, no murmurs / rubs / gallops.  Bilateral lower extremity pitting edema, right worse than left. 2+ pedal pulses. No carotid bruits.  Abdomen: Scaphoid abdomen, no tenderness, no masses palpated, no hepatosplenomegaly. Bowel sounds positive.  Musculoskeletal: no clubbing / cyanosis. No joint deformity upper and lower extremities. Good ROM, no contractures, no atrophy. Normal muscle tone.  Skin: no rashes, lesions, ulcers. No induration Neurologic: Sensation intact. Strength 5/5 in all 4.  Psychiatric: Normal judgment and insight. Alert and oriented x 3. Normal mood.   EKG: independently reviewed, showing atrial fibrillation with rate of 84, QTc 415  Chest x-ray on Admission: I personally reviewed and I agree with  radiologist reading as below.  DG Chest 2 View  Result Date: 09/21/2021 CLINICAL DATA:  high blood pressure EXAM: CHEST - 2 VIEW COMPARISON:  Chest x-ray dated September 19, 2021 FINDINGS: Cardiac and mediastinal contours are unchanged. Bibasilar left greater than right opacities, unchanged compared to prior exam and likely due to bronchiectasis. No new parenchymal opacity. No pleural effusion or pneumothorax. IMPRESSION: No acute cardiopulmonary disease. Electronically Signed   By: Yetta Glassman M.D.   On: 09/21/2021 17:01   CT HEAD WO CONTRAST (5MM)  Result Date: 09/21/2021 CLINICAL DATA:  Headache and dizziness all weekend, hypertension, tachycardia, history of atrial fibrillation EXAM: CT HEAD WITHOUT CONTRAST TECHNIQUE: Contiguous axial images were obtained from the base of the skull through the vertex without intravenous contrast. RADIATION DOSE REDUCTION: This exam was performed according to the departmental dose-optimization program which includes automated exposure control, adjustment of the mA and/or kV according to patient  size and/or use of iterative reconstruction technique. COMPARISON:  09/19/2021 FINDINGS: Brain: Stable confluent hypodensities throughout the periventricular and subcortical white matter consistent with chronic small vessel ischemic change. No evidence of acute infarct or hemorrhage. Lateral ventricles and midline structures are stable. No acute extra-axial fluid collections. No mass effect. Vascular: Stable atherosclerosis.  No hyperdense vessel. Skull: Normal. Negative for fracture or focal lesion. Sinuses/Orbits: No acute finding. Other: None. IMPRESSION: 1. Stable head CT, no acute intracranial process. Electronically Signed   By: Randa Ngo M.D.   On: 09/21/2021 17:19    Labs on Admission: I have personally reviewed following labs  CBC: Recent Labs  Lab 09/19/21 0357 09/21/21 1629  WBC 9.1 4.9  NEUTROABS 7.6  --   HGB 13.3 12.8  HCT 41.3 39.6  MCV 93.2 93.0   PLT 265 [id number]   Basic Metabolic Panel: Recent Labs  Lab 09/19/21 0357 09/21/21 1629 09/21/21 1934  NA 133* 131*  --   K 4.0 4.3  --   CL 94* 92*  --   CO2 30 31  --   GLUCOSE 114* 99  --   BUN 20 18  --   CREATININE 0.69 0.62  --   CALCIUM 9.5 9.1  --   MG 2.1  --  2.0  PHOS 3.7  --  2.9   GFR: Estimated Creatinine Clearance: 43.8 mL/min (by C-G formula based on SCr of 0.62 mg/dL).  Liver Function Tests: Recent Labs  Lab 09/19/21 0357 09/21/21 1629  AST 38 35  ALT 31 29  ALKPHOS 57 50  BILITOT 0.6 0.4  PROT 7.7 7.0  ALBUMIN 4.1 3.7   Thyroid Function Tests: Recent Labs    09/19/21 0357  TSH 6.268*  FREET4 1.24*   Urine analysis:    Component Value Date/Time   COLORURINE YELLOW (A) 09/19/2021 0414   APPEARANCEUR CLEAR (A) 09/19/2021 0414   LABSPEC 1.012 09/19/2021 [place] 7.0 09/19/2021 0414   GLUCOSEU NEGATIVE 09/19/2021 0414   HGBUR NEGATIVE 09/19/2021 0414   BILIRUBINUR NEGATIVE 09/19/2021 0414   KETONESUR NEGATIVE 09/19/2021 0414   PROTEINUR NEGATIVE 09/19/2021 0414   NITRITE NEGATIVE 09/19/2021 0414   LEUKOCYTESUR NEGATIVE 09/19/2021 0414   Dr. Tobie Poet [facility]  If 7PM-7AM, please contact overnight-coverage provider If 7AM-7PM, please contact day coverage provider [email or web address]  09/21/2021, 11:17 PM

## 2021-09-22 ENCOUNTER — Encounter: Payer: Medicare Other | Admitting: Physical Therapy

## 2021-09-22 ENCOUNTER — Observation Stay
Admit: 2021-09-22 | Discharge: 2021-09-22 | Disposition: A | Discharge status: Home / Self Care | Payer: Medicare Other | Attending: Internal Medicine | Admitting: Internal Medicine

## 2021-09-22 ENCOUNTER — Encounter: Payer: Self-pay | Admitting: Internal Medicine

## 2021-09-22 DIAGNOSIS — I480 Paroxysmal atrial fibrillation: Secondary | ICD-10-CM | POA: Diagnosis not present

## 2021-09-22 DIAGNOSIS — E871 Hypo-osmolality and hyponatremia: Secondary | ICD-10-CM | POA: Diagnosis not present

## 2021-09-22 DIAGNOSIS — I160 Hypertensive urgency: Secondary | ICD-10-CM | POA: Diagnosis not present

## 2021-09-22 LAB — BASIC METABOLIC PANEL
Anion gap: 6 (ref 5–15)
BUN: 13 mg/dL (ref 8–23)
CO2: 32 mmol/L (ref 22–32)
Calcium: 8.9 mg/dL (ref 8.9–10.3)
Chloride: 97 mmol/L — ABNORMAL LOW (ref 98–111)
Creatinine, Ser: 0.56 mg/dL (ref 0.44–1.00)
GFR, Estimated: >60 mL/min (ref >60)
Glucose, Bld: 94 mg/dL (ref 70–99)
Potassium: 4.7 mmol/L (ref 3.5–5.1)
Sodium: 135 mmol/L (ref 135–145)

## 2021-09-22 LAB — CBC
HCT: 39.4 % (ref 36.0–46.0)
Hemoglobin: 12.9 g/dL (ref 12.0–15.0)
MCH: 30 pg (ref 26.0–34.0)
MCHC: 32.7 g/dL (ref 30.0–36.0)
MCV: 91.6 fL (ref 80.0–100.0)
Platelets: 263 10*3/uL (ref 150–400)
RBC: 4.3 MIL/uL (ref 3.87–5.11)
RDW: 13.5 % (ref 11.5–15.5)
WBC: 8 10*3/uL (ref 4.0–10.5)
nRBC: 0 % (ref 0.0–0.2)

## 2021-09-22 LAB — ECHOCARDIOGRAM COMPLETE
AR max vel: 3.09 cm2
AV Area VTI: 3.6 cm2
AV Area mean vel: 3 cm2
AV Mean grad: 1 mmHg
AV Peak grad: 2.2 mmHg
Ao pk vel: 0.75 m/s
Area-P 1/2: 6.83 cm2
Height: 70 in
S' Lateral: 2.1 cm
Weight: 1868.8 oz

## 2021-09-22 LAB — VITAMIN B12: Vitamin B-12: 476 pg/mL (ref 180–914)

## 2021-09-22 MED ORDER — ALBUTEROL SULFATE (2.5 MG/3ML) 0.083% IN NEBU: 2.5000 mg | INHALATION_SOLUTION | Freq: Four times a day (QID) | RESPIRATORY_TRACT | Status: DC | PRN

## 2021-09-22 MED ORDER — ENSURE ENLIVE PO LIQD
237.0000 mL | Freq: Two times a day (BID) | ORAL | Status: DC
  Administered 2021-09-22 – 2021-09-23 (×2): 237 mL via ORAL

## 2021-09-22 MED ORDER — LISINOPRIL 5 MG PO TABS
2.5000 mg | ORAL_TABLET | Freq: Every day | ORAL | Status: DC
Start: 2021-09-22 — End: 2021-09-23
  Administered 2021-09-22 – 2021-09-23 (×2): 2.5 mg via ORAL
  Filled 2021-09-22 (×2): qty 1

## 2021-09-22 MED ORDER — RIVAROXABAN 20 MG PO TABS
20.0000 mg | ORAL_TABLET | Freq: Every day | ORAL | Status: DC
Start: 2021-09-22 — End: 2021-09-23
  Administered 2021-09-22: 20 mg via ORAL
  Filled 2021-09-22: qty 1

## 2021-09-22 NOTE — Care Management Obs Status (Signed)
MEDICARE OBSERVATION STATUS NOTIFICATION ? ? ?Patient Details  ?Name: Kaylee Santiago ?MRN: [id number] ?Date of Birth: 1935/02/12 ? ? ?Medicare Observation Status Notification Given:  Yes ? ? ? ?Margarito Liner, LCSW ?09/22/2021, 2:54 PM ?

## 2021-09-22 NOTE — Consult Note (Addendum)
?[facility] [place] CARDIOLOGY CONSULT NOTE  ? ?    ?Patient ID: ?Kaylee Santiago ?MRN: [id number] ?DOB/AGE: [birth year] 87 y.o. ? ?Admit date: 09/21/2021 ?Referring Physician Dr. Rupert Stacks  ?Primary Physician Dr. Caryl Comes ?Primary Cardiologist Dr. Ubaldo Glassing  ?Reason for Consultation elevated BNP ? ?HPI: The patient is an 87 year old female with a past medical history notable for chronic atrial fibrillation, hypertension, hypothyroidism, bronchiectasis who presented to [facility] ED 09/21/2021 with elevated blood pressure and a headache.  Cardiology was consulted because of an elevated BNP and concern for new onset heart failure. ? ?The patient initially presented to [facility] ED 7/82 with systolic blood pressures greater than 200.  CT and MRI of her head were negative for acute findings.  She was started on a beta-blocker and blood pressures improved to the 180s.  She presented to her primary care provider for an acute visit 3/13 with continuing global headache, difficulty word finding, and systolic blood pressure was in the 190s-peaking at 216.  She was recommended to present to the emergency room.  ? ?During interview this morning she is accompanied by her niece.  The patient recalls the history above and states that she had an severe headache that was her primary reason for coming to the ED.  She admits to some lower extremity swelling, right greater than left (from a prior injury) that always goes away when she props her legs up.  She states she was prescribed amlodipine in the past but has not been taking it at all due to concerns for lowering her blood pressure too much.  She states when she checks it at home and is initially in the 190s and after resting for a few minutes comes down to the 956O systolic.  She states she used to be very active but ever since having COVID in December she thinks her recall is a little bit slower and she has had a hard time recovering from an energy standpoint.  She denies any chest pain, shortness of  breath, heart racing, syncope, presyncope.  ? ?She is a retired Marine scientist and uses alcohol sparingly (only during social occasions) and tried a few cigarettes when she was younger but has not smoked in years.  ? ?Blood pressure on presentation was severely elevated to 198/82, improved to 169/85 with amlodipine 5, and home metoprolol 25. ? ?Labs on admission are notable for potassium of 4.7, creatinine 0.56, EGFR greater than 60.  Magnesium 2.0.  BNP mildly elevated to 166.8, troponin  x 1 of 7.  H&H 12.9/39.4, platelets 263.  Respiratory panel is negative for influenza and COVID.  Chest x-ray is without pleural effusion, pneumothorax, no acute cardiopulmonary disease.  ? ?Review of systems complete and found to be negative unless listed above  ? ? ? ?Past Medical History:  ?Diagnosis Date  ? Atrial fibrillation ([place])   ? Atrial fibrillation ([place])   ? Back pain   ? Dysrhythmia   ? Hypertension   ? Hypothyroidism   ?  ?Past Surgical History:  ?Procedure Laterality Date  ? ABDOMINAL HYSTERECTOMY    ? BREAST EXCISIONAL BIOPSY Right 70's  ? CATARACT EXTRACTION W/ INTRAOCULAR LENS  IMPLANT, BILATERAL    ? KYPHOPLASTY N/A 12/21/2018  ? Procedure: KYPHOPLASTY L1;  Surgeon: Hessie Knows, MD;  Location: [facility] ORS;  Service: Orthopedics;  Laterality: N/A;  ?  ?Medications Prior to Admission  ?Medication Sig Dispense Refill Last Dose  ? Biotin 5000 MCG TABS Take 5,000 mcg by mouth daily.   09/21/2021 at 0800  ?  Calcium Carb-Cholecalciferol (CALTRATE 600+D3 PO) Take 1 tablet by mouth daily.   09/21/2021 at 0800  ? levothyroxine (SYNTHROID) 75 MCG tablet Take 1 tablet by mouth daily.   09/21/2021 at 0800  ? LORazepam (ATIVAN) 0.5 MG tablet Take 0.5 mg by mouth at bedtime as needed for sleep.   09/20/2021 at 1800  ? metoprolol succinate (TOPROL-XL) 50 MG 24 hr tablet Take 50 mg by mouth in the morning and at bedtime.   09/21/2021 at 0800  ? Multiple Vitamin (MULTI-VITAMIN DAILY PO) Take 1 tablet by mouth daily.   09/20/2021 at 1800  ?  Saccharomyces boulardii (PROBIOTIC) 250 MG CAPS Take 1 capsule by mouth daily.   09/21/2021 at 0800  ? vitamin E 400 UNIT capsule Take 400 Units by mouth daily.   09/21/2021 at 0800  ? XARELTO 20 MG TABS tablet Take 20 mg by mouth daily.   09/21/2021 at 0800  ? albuterol (VENTOLIN HFA) 108 (90 Base) MCG/ACT inhaler Inhale 2 puffs into the lungs every 6 (six) hours. 18 g 0 prn at unknown  ? amLODipine (NORVASC) 2.5 MG tablet Take 1 tablet (2.5 mg total) by mouth daily. (Patient not taking: Reported on 09/21/2021) 30 tablet 0 Not Taking  ? ascorbic acid (VITAMIN C) 500 MG tablet Take 1 tablet (500 mg total) by mouth daily. (Patient not taking: Reported on 09/21/2021) 30 tablet 0 Not Taking  ? LAGEVRIO 200 MG CAPS capsule Take 4 capsules by mouth 2 (two) times daily. (Patient not taking: Reported on 09/19/2021)     ? loratadine (CLARITIN) 10 MG tablet Take 10 mg by mouth daily.   prn at unknown  ? predniSONE (DELTASONE) 10 MG tablet 4 tabs po daily for three days (Patient not taking: Reported on 09/19/2021) 12 tablet 0 Not Taking  ? zinc sulfate 220 (50 Zn) MG capsule Take 1 capsule (220 mg total) by mouth daily. (Patient not taking: Reported on 09/21/2021) 30 capsule 0 Not Taking  ? ?Social History  ? ?Socioeconomic History  ? Marital status: Widowed  ?  Spouse name: Not on file  ? Number of children: Not on file  ? Years of education: Not on file  ? Highest education level: Not on file  ?Occupational History  ? Not on file  ?Tobacco Use  ? Smoking status: Former  ?  Types: Cigarettes  ? Smokeless tobacco: Never  ?Vaping Use  ? Vaping Use: Never used  ?Substance and Sexual Activity  ? Alcohol use: Not Currently  ? Drug use: Never  ? Sexual activity: Not on file  ?Other Topics Concern  ? Not on file  ?Social History Narrative  ? Lives at home independently.  Ambulates steadily.  ? ?Social Determinants of Health  ? ?Financial Resource Strain: Not on file  ?Food Insecurity: Not on file  ?Transportation Needs: Not on file   ?Physical Activity: Not on file  ?Stress: Not on file  ?Social Connections: Not on file  ?Intimate Partner Violence: Not on file  ?  ?Family History  ?Problem Relation Age of Onset  ? Breast cancer Sister 103  ? Breast cancer Sister   ?     33's  ? CAD Mother   ?  ? ? ?Review of systems complete and found to be negative unless listed above  ? ? ?PHYSICAL EXAM ?General: Pleasant elderly and thin Caucasian female, well nourished, in no acute distress.  Sitting upright in PCU bed, niece at bedside ?HEENT:  Normocephalic and atraumatic. ?Neck:  No JVD.  ?  Lungs: Normal respiratory effort on room air.  Scattered rhonchi. ?Heart: Irregularly irregular rhythm with controlled rate. Normal S1 and S2 without gallops or murmurs. Radial & DP pulses 2+ bilaterally. ?Abdomen: Non-distended appearing.  ?Msk: Normal strength and tone for age. ?Extremities: Warm and well perfused. No clubbing, cyanosis.  No peripheral edema.  Right lower extremity with chronic appearing ecchymosis. ?Neuro: Alert and oriented X 3. ?Psych:  Answers questions appropriately.  Mood somewhat anxious. ? ?Labs: ?  ?Lab Results  ?Component Value Date  ? WBC 8.0 09/22/2021  ? HGB 12.9 09/22/2021  ? HCT 39.4 09/22/2021  ? MCV 91.6 09/22/2021  ? PLT 263 09/22/2021  ?  ?Recent Labs  ?Lab 09/21/21 ?1629 09/22/21 ?0543  ?NA 131* 135  ?K 4.3 4.7  ?CL 92* 97*  ?CO2 31 32  ?BUN 18 13  ?CREATININE 0.62 0.56  ?CALCIUM 9.1 8.9  ?PROT 7.0  --   ?BILITOT 0.4  --   ?ALKPHOS 50  --   ?ALT 29  --   ?AST 35  --   ?GLUCOSE 99 94  ? ?Lab Results  ?Component Value Date  ? TROPONINI 0.03 (HH) 06/29/2018  ? No results found for: CHOL ?No results found for: HDL ?No results found for: [place] ?No results found for: TRIG ?No results found for: CHOLHDL ?No results found for: LDLDIRECT  ?  ?Radiology: DG Chest 2 View ? ?Result Date: 09/21/2021 ?CLINICAL DATA:  high blood pressure EXAM: CHEST - 2 VIEW COMPARISON:  Chest x-ray dated September 19, 2021 FINDINGS: Cardiac and mediastinal contours  are unchanged. Bibasilar left greater than right opacities, unchanged compared to prior exam and likely due to bronchiectasis. No new parenchymal opacity. No pleural effusion or pneumothorax. IMPRESSION: No acute cardi

## 2021-09-22 NOTE — Progress Notes (Addendum)
Initial Nutrition Assessment ? ?DOCUMENTATION CODES:  ? ?Underweight, Severe malnutrition in context of social or environmental circumstances ? ?INTERVENTION:  ? ?-Ensure Enlive po BID, each supplement provides 350 kcal and 20 grams of protein.  ?-MVI with minerals daily ?-Liberalize diet to regular ? ?NUTRITION DIAGNOSIS:  ? ?Severe Malnutrition related to social / environmental circumstances as evidenced by moderate fat depletion, severe fat depletion, moderate muscle depletion, severe muscle depletion. ? ?GOAL:  ? ?Patient will meet greater than or equal to 90% of their needs ? ?MONITOR:  ? ?PO intake, Supplement acceptance, Labs, Weight trends, Skin, I & O's ? ?REASON FOR ASSESSMENT:  ? ?Consult ?Assessment of nutrition requirement/status ? ?ASSESSMENT:  ? ?Kaylee Santiago is a 87 year old female with history of hypothyroid, hypertension, history of COVID-19 infection in December 2022, atrial fibrillation currently on Xarelto and metoprolol, presents emergency department for chief concerns of headache, and hypertensive urgency from PCPs office. ? ?Pt admitted with hypertensive urgency.  ? ?Reviewed I/O's: -320 ml x 24 hours ? ?UOP: 800 ml x 24 hours ? ?Spoke with pt at bedside, who was pleasant and in good spirits today. She shares that she experienced a general decline in health over the past 1-2 years due to her her husband's declining health and subsequent death. She admits to this RD that she was so focused on caring for him that her priorities became secondary. She became concerned about her weight loss and has tried to have a more consistent eating pattern. Pt consumes 3 meals per day (Breakfast: eggs and toast; Lunch: out with friends (ex [place]), Snack: yogurt and peanut butter or fruit; Dinner: soup and fruit or meat, starch, and vegetable). Pt recently started consuming 1 Ensure supplement daily. Noted meal completions 100%.  ? ?Wt has been stable over the past 6 months.  ? ?RD provided pt with  reflective listening and emotional support as pt discussed finding her new normal after her husband's passing and other losses. Pt is very positive and spoke about her great support network, including friends, family, and neighbors. Discussed importance of good meal and supplement intake to promote healing. Encouraged pt to continue regular meal pattern and dining out for socialization. Pt amenable to Ensure.  ? ?Medications reviewed and include vitamin C.  ? ?Labs reviewed.  ? ?NUTRITION - FOCUSED PHYSICAL EXAM: ? ?Flowsheet Row Most Recent Value  ?Orbital Region Moderate depletion  ?Upper Arm Region Severe depletion  ?Thoracic and Lumbar Region Severe depletion  ?Buccal Region Moderate depletion  ?Temple Region Severe depletion  ?Clavicle Bone Region Severe depletion  ?Clavicle and Acromion Bone Region Severe depletion  ?Scapular Bone Region Severe depletion  ?Dorsal Hand Moderate depletion  ?Patellar Region Moderate depletion  ?Anterior Thigh Region Moderate depletion  ?Posterior Calf Region Moderate depletion  ?Edema (RD Assessment) None  ?Hair Reviewed  ?Eyes Reviewed  ?Mouth Reviewed  ?Skin Reviewed  ?Nails Reviewed  ? ?  ? ? ?Diet Order:   ?Diet Order   ? ?       ?  Diet NPO time specified  Diet effective midnight       ?  ?  Diet Heart Room service appropriate? Yes; Fluid consistency: Thin  Diet effective now       ?  ? ?  ?  ? ?  ? ? ?EDUCATION NEEDS:  ? ?Education needs have been addressed ? ?Skin:  Skin Assessment: Reviewed RN Assessment ? ?Last BM:  09/20/21 ? ?Height:  ? ?Ht Readings from Last  1 Encounters:  ?09/21/21 5\' 10"  (1.778 m)  ? ? ?Weight:  ? ?Wt Readings from Last 1 Encounters:  ?09/21/21 53 kg  ? ? ?Ideal Body Weight:  75.5 kg ? ?BMI:  Body mass index is 16.76 kg/m?. ? ?Estimated Nutritional Needs:  ? ?Kcal:  1600-1800 ? ?Protein:  80-95 grams ? ?Fluid:  > 1.6 L ? ? ? ?09/23/21, RD, LDN, CDCES ?Registered Dietitian II ?Certified Diabetes Care and Education Specialist ?Please refer to [facility]  for RD and/or RD on-call/weekend/after hours pager  ?

## 2021-09-22 NOTE — Assessment & Plan Note (Signed)
Stable

## 2021-09-22 NOTE — Assessment & Plan Note (Signed)
Continue Synthroid °

## 2021-09-22 NOTE — Assessment & Plan Note (Signed)
Condition stable  

## 2021-09-22 NOTE — Assessment & Plan Note (Signed)
-   Compensated at this time not in acute exacerbation ?

## 2021-09-22 NOTE — Progress Notes (Signed)
*  PRELIMINARY RESULTS* ?Echocardiogram ?2D Echocardiogram has been performed. ? ?Kaylee Santiago, [name] ?09/22/2021, 9:37 AM ?

## 2021-09-22 NOTE — Progress Notes (Signed)
?  Progress Note ? ? ?Patient: Kaylee Santiago FVC:[id number] DOB: [birth year] DOA: 09/21/2021     0 ?DOS: the patient was seen and examined on 09/22/2021 ?  ?Brief hospital course: ?[name] is a 87 year old female with history of hypothyroid, hypertension, history of COVID-19 infection in December 2022, atrial fibrillation currently on Xarelto and metoprolol, presents emergency department for chief concerns of headache, and hypertensive urgency from PCPs office. ?Patient was taking metoprolol at home, she was prescribed amlodipine, but she was not compliant with it.  Her blood pressure has been running high for the last 2 weeks, she had 3 incidents with blood pressure running above 200s. ?Upon arriving the hospital, she was put back on metoprolol and amlodipine. ? ?Assessment and Plan: ?* Hypertensive urgency ?Patient had 3 episodes of elevated blood pressure in the last 2 weeks.  I will continue metoprolol, cardiology has added lisinopril, she probably need a much higher dose.  We probably will need a as needed blood pressure medicine at time of discharge. ?Due to recent elevation in blood pressure, I will need to rule out secondary hypertension.  Obtain renal arterial ultrasound to rule out renal artery stenosis. ?24-hour urine collection to rule out pheochromocytoma. ?Check serum renin activity/Aldactone level to rule out primary hyperaldosteronism. ?Plan to discharge patient after work-up is completed tomorrow. ? ?Bronchiectasis (HCC) ?Stable. ? ?Hyponatremia ?Condition stable. ? ?Malnutrition of moderate degree ?Start a protein supplements. ? ?Hypothyroidism ?Continue Synthroid. ? ?AF (paroxysmal atrial fibrillation) (HCC) ?Continue Xarelto. ? ? ? ? ?  ? ?Subjective:  ?Patient is doing better today, no headache or dizziness pain ?No short of breath or cough. ? ?Physical Exam: ?Vitals:  ? 09/21/21 2327 09/22/21 0324 09/22/21 0738 09/22/21 1211  ?BP: (!) 157/65 (!) 160/61 (!) 159/85 (!) 152/57  ?Pulse: 69 70 77  68  ?Resp: 17 17 18 19   ?Temp: 97.6 ?F (36.4 ?C) 97.7 ?F (36.5 ?C) 98 ?F (36.7 ?C) 97.7 ?F (36.5 ?C)  ?TempSrc: Oral Oral    ?SpO2: 95% 99% 99% 100%  ?Weight:      ?Height:      ? ?General exam: Appears calm and comfortable  ?Respiratory system: Clear to auscultation. Respiratory effort normal. ?Cardiovascular system: S1 & S2 heard, RRR. No JVD, murmurs, rubs, gallops or clicks. No pedal edema. ?Gastrointestinal system: Abdomen is nondistended, soft and nontender. No organomegaly or masses felt. Normal bowel sounds heard. ?Central nervous system: Alert and oriented. No focal neurological deficits. ?Extremities: Symmetric 5 x 5 power. ?Skin: No rashes, lesions or ulcers ?Psychiatry: Judgement and insight appear normal. Mood & affect appropriate.  ? ?Data Reviewed: ?Reviewed all lab results. ? ?Family Communication: Daughter updated at the bedside. ? ?Disposition: ?Status is: Observation ? ? Planned Discharge Destination: Home ? ? ? ?Time spent: 27 minutes ? ?Author: ? , MD ?09/22/2021 1:11 PM ? ?For on call review [email or web address].  ?

## 2021-09-22 NOTE — Hospital Course (Signed)
[name] is a 87 year old female with history of hypothyroid, hypertension, history of COVID-19 infection in December 2022, atrial fibrillation currently on Xarelto and metoprolol, presents emergency department for chief concerns of headache, and hypertensive urgency from PCPs office. ?Patient was taking metoprolol at home, she was prescribed amlodipine, but she was not compliant with it.  Her blood pressure has been running high for the last 2 weeks, she had 3 incidents with blood pressure running above 200s. ?Upon arriving the hospital, she was put back on metoprolol and amlodipine. ?

## 2021-09-22 NOTE — Assessment & Plan Note (Signed)
Continue Xarelto 

## 2021-09-22 NOTE — Assessment & Plan Note (Signed)
Start a protein supplements. ?

## 2021-09-22 NOTE — Assessment & Plan Note (Addendum)
Patient had 3 episodes of elevated blood pressure in the last 2 weeks.  I will continue metoprolol, cardiology has added lisinopril, she probably need a much higher dose.  We probably will need a as needed blood pressure medicine at time of discharge. ?Due to recent elevation in blood pressure, I will need to rule out secondary hypertension.  Obtain renal arterial ultrasound to rule out renal artery stenosis. ?24-hour urine collection to rule out pheochromocytoma. ?Check serum renin activity/Aldactone level to rule out primary hyperaldosteronism. ?Plan to discharge patient after work-up is completed tomorrow. ?

## 2021-09-22 NOTE — TOC CM/SW Note (Signed)
Gave patient brochures for life alert devices per request. ? ?[place]ton Scrape, [place] ?[phone] ? ?

## 2021-09-23 ENCOUNTER — Telehealth: Payer: Self-pay | Admitting: Physical Therapy

## 2021-09-23 ENCOUNTER — Observation Stay: Payer: Medicare Other

## 2021-09-23 DIAGNOSIS — I160 Hypertensive urgency: Secondary | ICD-10-CM | POA: Diagnosis not present

## 2021-09-23 DIAGNOSIS — E43 Unspecified severe protein-calorie malnutrition: Secondary | ICD-10-CM | POA: Insufficient documentation

## 2021-09-23 LAB — CREATININE, URINE, 24 HOUR
Collection Interval-UCRE24: 24 hours
Creatinine, 24H Ur: 456 mg/d — ABNORMAL LOW (ref 600–1800)
Creatinine, Urine: 38 mg/dL
Urine Total Volume-UCRE24: 1200 mL

## 2021-09-23 IMAGING — US US RENAL ARTERY STENOSIS
1 series · 13 of 25 positions shown · non-contrast
Comparison: None.

CLINICAL DATA: Hypertension

EXAM:
RENAL/URINARY TRACT ULTRASOUND
RENAL DUPLEX DOPPLER ULTRASOUND

[Series 1: us renal artery duplex complete · 13 of 100 slices shown]
[im 1/100]
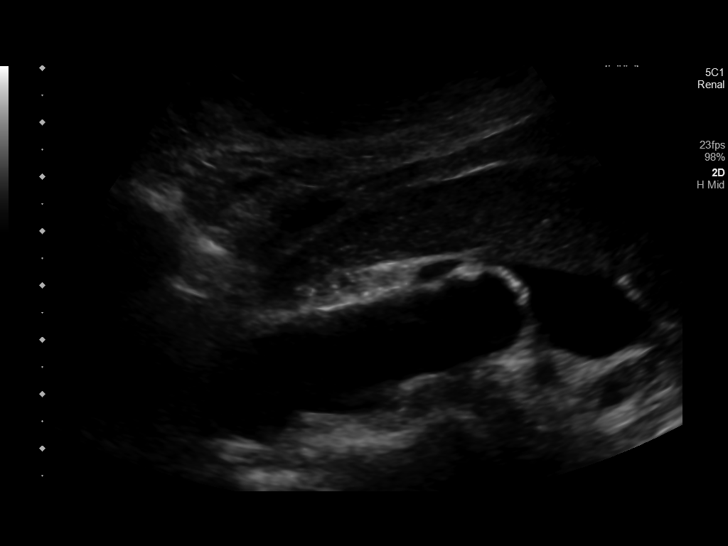
[im 9/100]
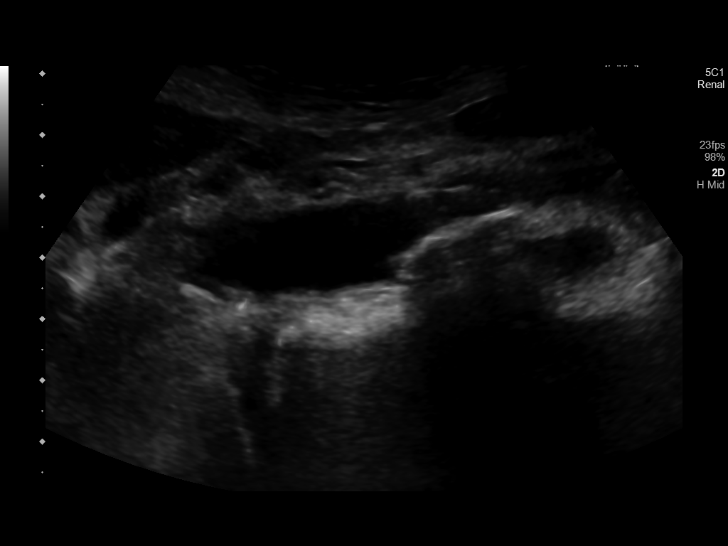
[im 17/100]
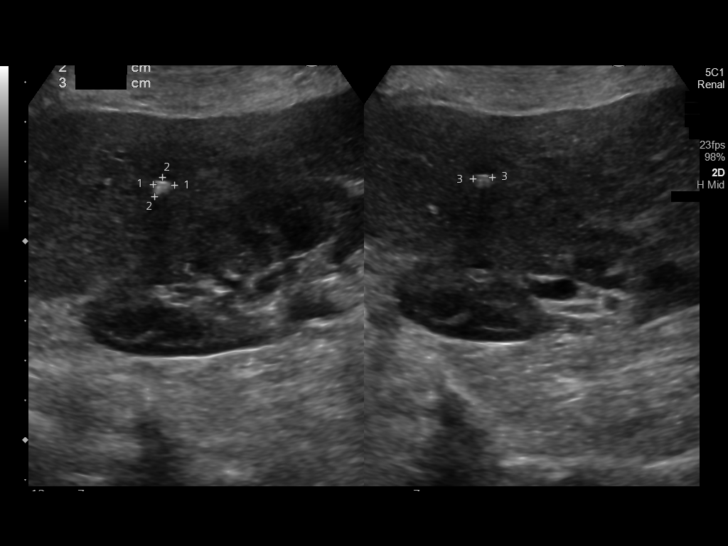
[im 25/100]
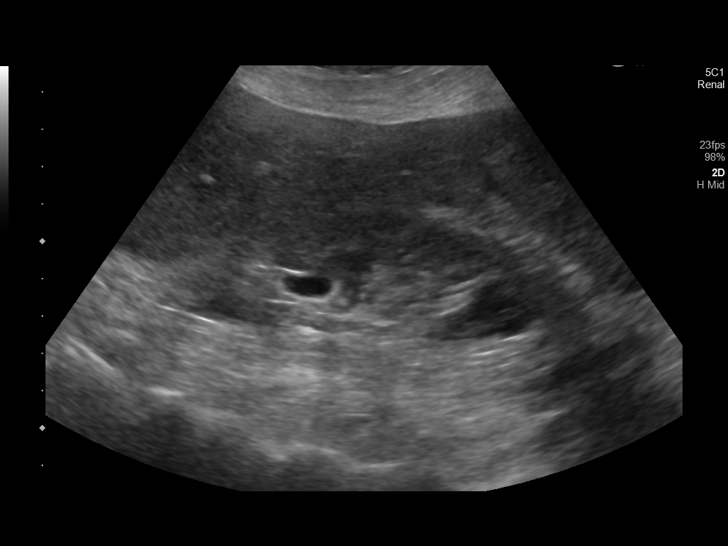
[im 34/100]
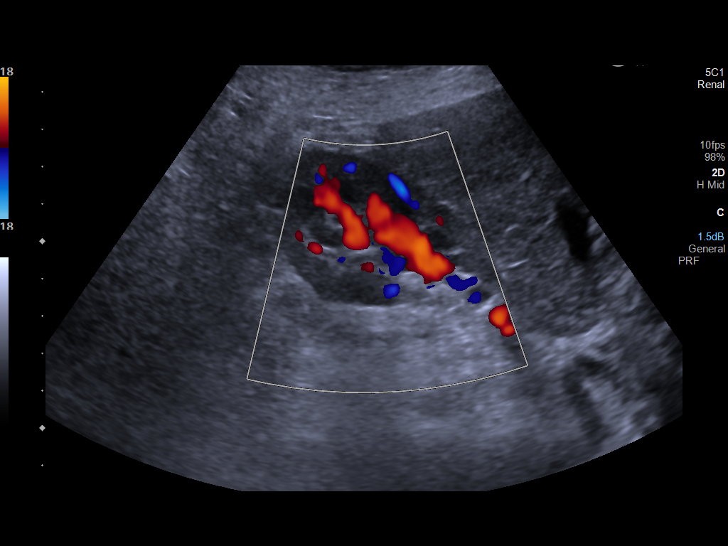
[im 42/100]
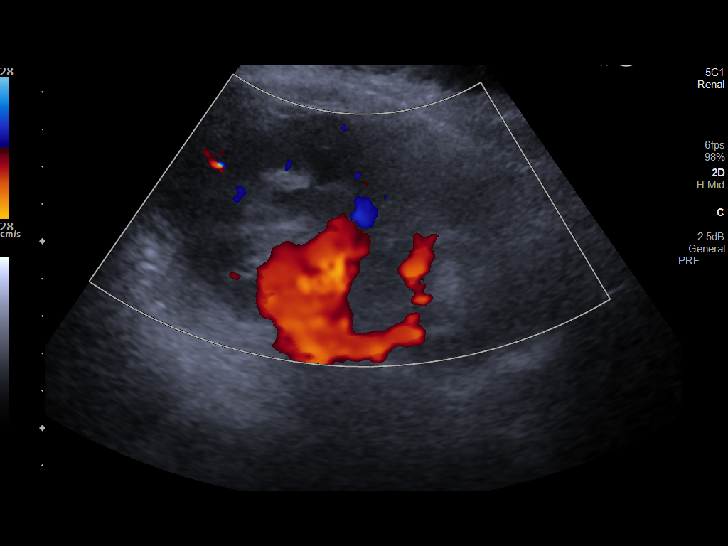
[im 50/100]
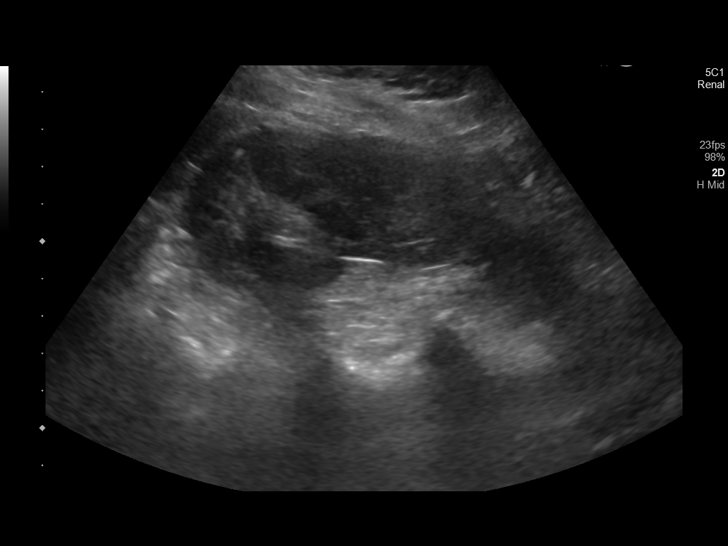
[im 58/100]
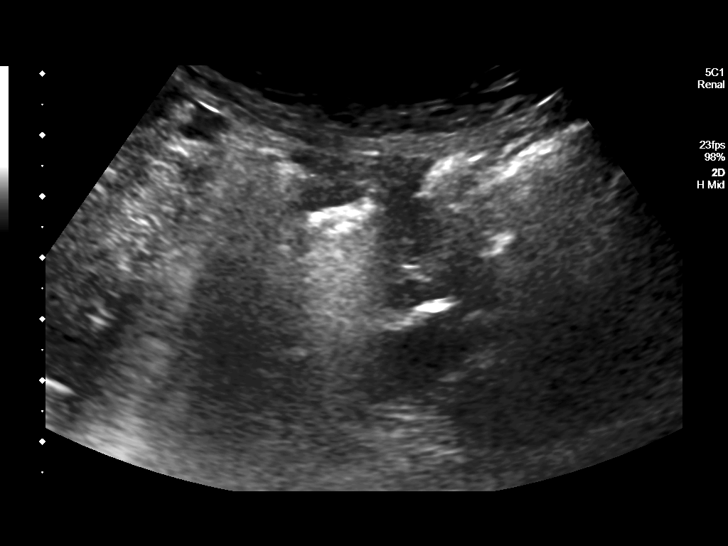
[im 67/100]
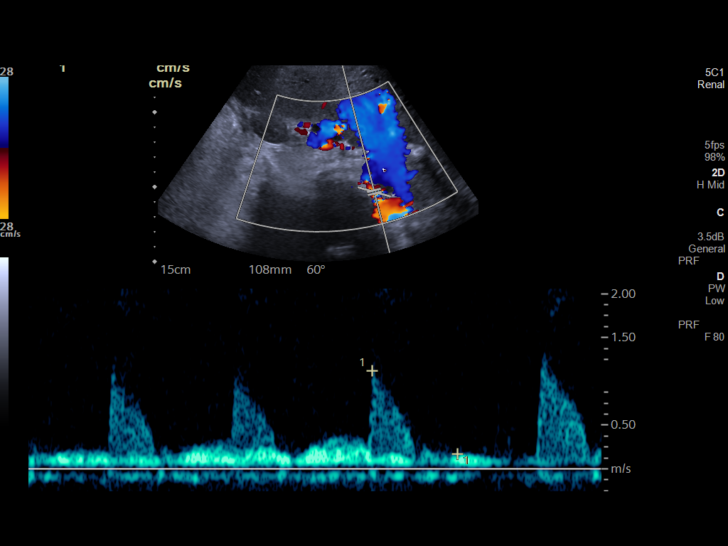
[im 75/100]
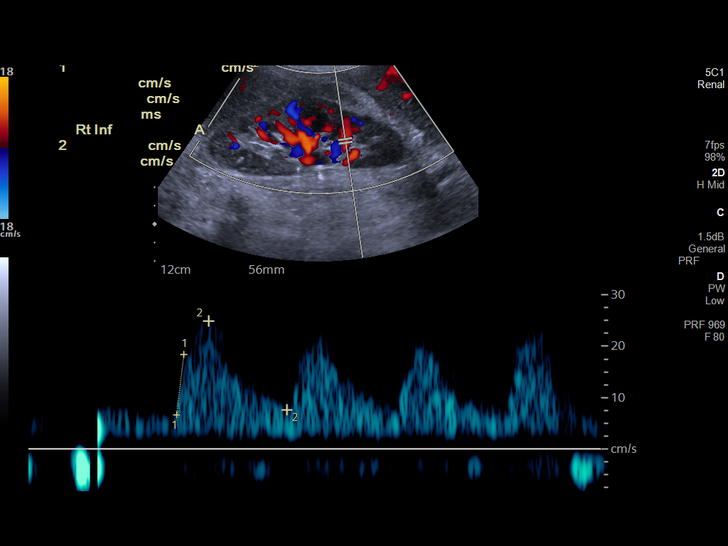
[im 83/100]
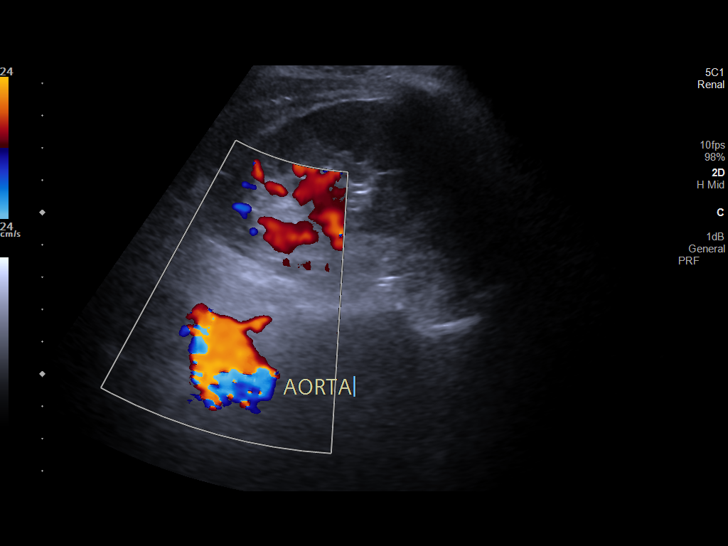
[im 91/100]
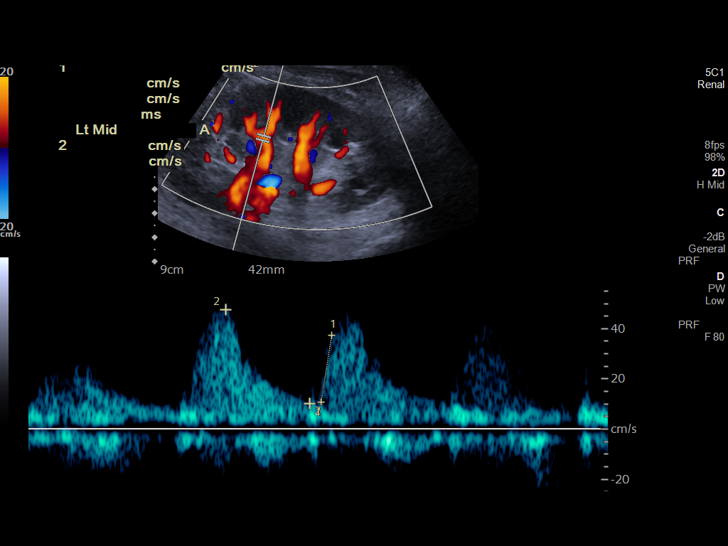
[im 100/100]
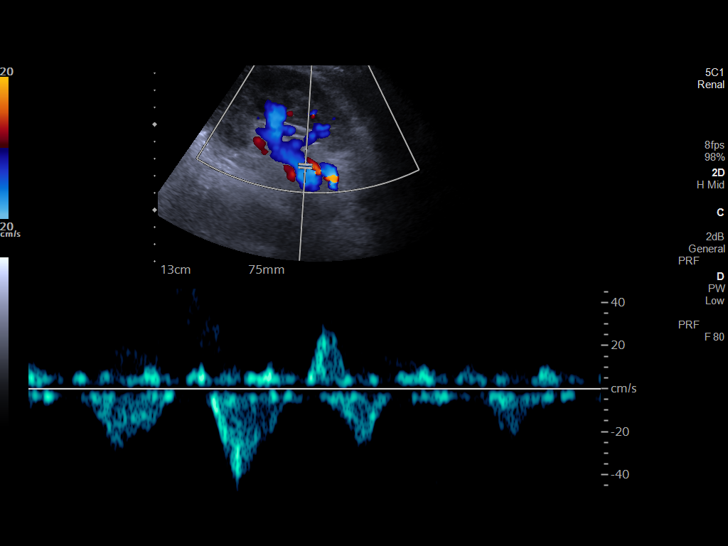

[13 of 25 positions shown; findings below may reference images not displayed]

FINDINGS: Right Kidney:

Length: 10.9 cm. Echogenicity within normal limits. No mass or
hydronephrosis visualized.

Left Kidney:

Length: 10.7 cm. Echogenicity within normal limits. No mass or
hydronephrosis visualized.

Bladder:  Not well visualized, likely decompressed.

RENAL DUPLEX ULTRASOUND

Right Renal Artery Velocities:

Origin:  112 cm/sec

Mid:  147 cm/sec

Hilum:  44 cm/sec

Interlobar:  38 cm/sec

Arcuate:  15 cm/sec, normal resistive indices.

Left Renal Artery Velocities:

Origin:  136 cm/sec

Mid:  116 cm/sec

Hilum:  138 cm/sec

Interlobar:  48 cm/sec

Arcuate:  21 cm/sec, normal resistive indices.

Aortic Velocity:  169 cm/sec

Right Renal-Aortic Ratios:

Origin:

Mid:

Hilum:

Interlobar:

Arcuate:

Left Renal-Aortic Ratios:

Origin:

Mid:

Hilum:

Interlobar:

Arcuate:

Renal veins are noted to be patent bilaterally.
IMPRESSION: Normal sonographic appearance of the kidneys without hydronephrosis.
Normal Doppler examination of the renal vasculature without findings
of hemodynamically significant renal artery stenosis.

## 2021-09-23 MED ORDER — HYDROXYZINE HCL 10 MG PO TABS
10.0000 mg | ORAL_TABLET | Freq: Three times a day (TID) | ORAL | Status: DC | PRN
  Administered 2021-09-23: 10 mg via ORAL
  Filled 2021-09-23 (×2): qty 1

## 2021-09-23 MED ORDER — LISINOPRIL 2.5 MG PO TABS: 2.5000 mg | ORAL_TABLET | Freq: Every day | ORAL | 2 refills | Status: DC

## 2021-09-23 NOTE — Progress Notes (Signed)
Assumed care of pt at 1900. A&O x4. RA. No c/o pain overnight. 24 hr urine collection in process. Pt verbalized understanding of POC. NPO at midnight for renal artery duplex. Vitals and full assessment per flowsheets. Medication administration per MAR. Ambulating steadily around the room. Making needs known. Call bell within reach. Comfort and safety maintained.  ?

## 2021-09-23 NOTE — Discharge Summary (Signed)
?[facility] ? ?Physician Discharge Summary  ? ?Patient ID: ?Kaylee Santiago ?MRN: [id number] ?DOB/AGE: [birth year] 87 y.o. ? ?Admit date: 09/21/2021 ?Discharge date: 09/23/2021   ? ?PCP: Lynnea FerrierKlein, Bert J III, MD ? ?DISCHARGE DIAGNOSES:  ?Principal Problem: ?  Hypertensive urgency ?Active Problems: ?  AF (paroxysmal atrial fibrillation) (HCC) ?  Hypothyroidism ?  Essential hypertension ?  Malnutrition of moderate degree ?  Hyponatremia ?  Bronchiectasis (HCC) ?  History of COVID-19 ?  Lower extremity edema ?  Protein-calorie malnutrition, severe ? ? ?RECOMMENDATIONS FOR OUTPATIENT FOLLOW UP: ?Cardiology to arrange outpatient follow-up ?24-hour urine collection for pheochromocytoma is pending ?Plasma renin activity and aldosterone levels are pending. ? ? ? ?Home Health: Resume home health ?Equipment/Devices: None ? ?CODE STATUS: Full code ? ?DISCHARGE CONDITION: fair ? ?Diet recommendation: As before ? ?INITIAL HISTORY: ?Ms. Kaylee BeaversHazel Santiago is a 87 year old female with history of hypothyroid, hypertension, history of COVID-19 infection in December 2022, atrial fibrillation currently on Xarelto and metoprolol, presents emergency department for chief concerns of headache, and hypertensive urgency from PCPs office. ?Patient was taking metoprolol at home, she was prescribed amlodipine, but she was not compliant with it.  Her blood pressure has been running high for the last 2 weeks, she had 3 incidents with blood pressure running above 200s. ?Upon arriving the hospital, she was put back on metoprolol and amlodipine. ? ?Consultations: ?Cardiology ? ?Procedures: ?None ? ? ?HOSPITAL COURSE:  ? ?* Hypertensive urgency ?Patient had 3 episodes of elevated blood pressure in the last 2 weeks.  Could have been precipitated by steroids.  Patient was seen by cardiology.  Lisinopril was added to metoprolol.  Blood pressure better controlled now.  Renal arterial ultrasound did not show any stenosis of the renal arteries.  24-hour urine  collection was ordered for pheochromocytoma and results are pending.  Plasma renin activity and aldosterone levels are pending.   ? ?Bronchiectasis (HCC) ?Stable. ? ?Hyponatremia ?stable. ? ?Hypothyroidism ?Continue Synthroid. ? ?AF (paroxysmal atrial fibrillation) (HCC) ?Continue Xarelto. ? ?Severe protein calorie malnutrition ?Nutrition Problem: Severe Malnutrition ?Etiology: social / environmental circumstances ?Signs/Symptoms: moderate fat depletion, severe fat depletion, moderate muscle depletion, severe muscle depletion ?Interventions: Ensure Enlive (each supplement provides 350kcal and 20 grams of protein), MVI, Liberalize Diet ? ? ?Patient is stable.  Blood pressure has improved.  Okay for discharge home today. ? ? ?PERTINENT LABS: ? ?The results of significant diagnostics from this hospitalization (including imaging, microbiology, ancillary and laboratory) are listed below for reference.   ? ?Microbiology: ?Recent Results (from the past 240 hour(s))  ?Culture, blood (routine x 2)     Status: None  ? Collection Time: 09/19/21  3:57 AM  ? Specimen: BLOOD  ?Result Value Ref Range Status  ? Specimen Description BLOOD RIGHT ANTECUBITAL  Final  ? Special Requests   Final  ?  BOTTLES DRAWN AEROBIC AND ANAEROBIC Blood Culture adequate volume  ? Culture   Final  ?  NO GROWTH 5 DAYS ?Performed at [facility], [address] [place], [place] [address] ?  ? Report Status 09/24/2021 FINAL  Final  ?Culture, blood (routine x 2)     Status: None  ? Collection Time: 09/19/21  3:57 AM  ? Specimen: BLOOD  ?Result Value Ref Range Status  ? Specimen Description BLOOD LEFT ANTECUBITAL  Final  ? Special Requests   Final  ?  BOTTLES DRAWN AEROBIC AND ANAEROBIC Blood Culture adequate volume  ? Culture   Final  ?  NO GROWTH  5 DAYS ?Performed at [facility], [address] [place], [place] [address] ?  ? Report Status 09/24/2021 FINAL  Final  ?Resp Panel by RT-PCR (Flu A&B, Covid) Nasopharyngeal Swab      Status: None  ? Collection Time: 09/19/21  3:57 AM  ? Specimen: Nasopharyngeal Swab; Nasopharyngeal(NP) swabs in vial transport medium  ?Result Value Ref Range Status  ? SARS Coronavirus 2 by RT PCR NEGATIVE NEGATIVE Final  ?  Comment: (NOTE) ?SARS-CoV-2 target nucleic acids are NOT DETECTED. ? ?The SARS-CoV-2 RNA is generally detectable in upper respiratory ?specimens during the acute phase of infection. The lowest ?concentration of SARS-CoV-2 viral copies this assay can detect is ?138 copies/mL. A negative result does not preclude SARS-Cov-2 ?infection and should not be used as the sole basis for treatment or ?other patient management decisions. A negative result may occur with  ?improper specimen collection/handling, submission of specimen other ?than nasopharyngeal swab, presence of viral mutation(s) within the ?areas targeted by this assay, and inadequate number of viral ?copies(<138 copies/mL). A negative result must be combined with ?clinical observations, patient history, and epidemiological ?information. The expected result is Negative. ? ?Fact Sheet for Patients:  ?BloggerCourse.com ? ?Fact Sheet for Healthcare Providers:  ?SeriousBroker.it ? ?This test is no t yet approved or cleared by the [place] FDA and  ?has been authorized for detection and/or diagnosis of SARS-CoV-2 by ?FDA under an Emergency Use Authorization (EUA). This EUA will remain  ?in effect (meaning this test can be used) for the duration of the ?COVID-19 declaration under Section 564(b)(1) of the Act, 21 ?U.S.C.section 360bbb-3(b)(1), unless the authorization is terminated  ?or revoked sooner.  ? ? ?  ? Influenza A by PCR NEGATIVE NEGATIVE Final  ? Influenza B by PCR NEGATIVE NEGATIVE Final  ?  Comment: (NOTE) ?The Xpert Xpress SARS-CoV-2/FLU/RSV plus assay is intended as an aid ?in the diagnosis of influenza from Nasopharyngeal swab specimens and ?should not be used as a sole basis  for treatment. Nasal washings and ?aspirates are unacceptable for Xpert Xpress SARS-CoV-2/FLU/RSV ?testing. ? ?Fact Sheet for Patients: ?BloggerCourse.com ? ?Fact Sheet for Healthcare Providers: ?SeriousBroker.it ? ?This test is not yet approved or cleared by the [place] FDA and ?has been authorized for detection and/or diagnosis of SARS-CoV-2 by ?FDA under an Emergency Use Authorization (EUA). This EUA will remain ?in effect (meaning this test can be used) for the duration of the ?COVID-19 declaration under Section 564(b)(1) of the Act, 21 U.S.C. ?section 360bbb-3(b)(1), unless the authorization is terminated or ?revoked. ? ?Performed at [facility], [address] [place], ?[place] [address] ?  ?Urine Culture     Status: Abnormal  ? Collection Time: 09/19/21  4:14 AM  ? Specimen: Urine, Random  ?Result Value Ref Range Status  ? Specimen Description   Final  ?  URINE, RANDOM ?Performed at [facility], [address], [place], [place] [address] ?  ? Special Requests   Final  ?  NONE ?Performed at [facility], [address], [place], [place] [address] ?  ? Culture (A)  Final  ?  <10,000 COLONIES/mL INSIGNIFICANT GROWTH ?Performed at [facility], [address], [place], [place] [address] ?  ? Report Status 09/20/2021 FINAL  Final  ?Resp Panel by RT-PCR (Flu A&B, Covid) Nasopharyngeal Swab     Status: None  ? Collection Time: 09/21/21  6:57 PM  ? Specimen: Nasopharyngeal Swab; Nasopharyngeal(NP) swabs in vial transport medium  ?Result Value Ref  Range Status  ? SARS Coronavirus 2 by RT PCR NEGATIVE NEGATIVE Final  ?  Comment: (NOTE) ?SARS-CoV-2 target nucleic acids are NOT DETECTED. ? ?The SARS-CoV-2 RNA is generally detectable in upper respiratory ?specimens during the acute phase of infection. The lowest ?concentration of SARS-CoV-2 viral copies this assay can detect is ?138 copies/mL. A negative result does not preclude  SARS-Cov-2 ?infection and should not be used as the sole basis for treatment or ?other patient management decisions. A negative result may occur with  ?improper specimen collection/handling, submission of specim

## 2021-09-23 NOTE — Progress Notes (Addendum)
?[facility] CONSULT NOTE  ? ?    ?Patient ID: ?Kaylee Santiago ?MRN: [id number] ?DOB/AGE: 1934/07/23 87 y.o. ? ?Admit date: 09/21/2021 ?Referring Physician Dr. Rupert Stacks  ?Primary Physician Dr. Caryl Comes ?Primary Cardiologist Dr. Ubaldo Glassing  ?Reason for Consultation elevated BNP ? ?HPI: The patient is an 87 year old female with a past medical history notable for chronic atrial fibrillation, hypertension, hypothyroidism, bronchiectasis who presented to [facility] ED 09/21/2021 with elevated blood pressure and a headache.  Cardiology was consulted because of an elevated BNP and concern for new onset heart failure. ? ?Interval history:  ?- feels rather anxious this morning, very eager to get home.  ?- no chest pain, sob, palpitations, peripheral edema ? ?Review of systems complete and found to be negative unless listed above  ? ? ? ?Past Medical History:  ?Diagnosis Date  ? Atrial fibrillation ([place])   ? Atrial fibrillation ([place])   ? Back pain   ? Dysrhythmia   ? Hypertension   ? Hypothyroidism   ?  ?Past Surgical History:  ?Procedure Laterality Date  ? ABDOMINAL HYSTERECTOMY    ? BREAST EXCISIONAL BIOPSY Right 70's  ? CATARACT EXTRACTION W/ INTRAOCULAR LENS  IMPLANT, BILATERAL    ? KYPHOPLASTY N/A 12/21/2018  ? Procedure: KYPHOPLASTY L1;  Surgeon: Hessie Knows, MD;  Location: [facility] ORS;  Service: Orthopedics;  Laterality: N/A;  ?  ?Medications Prior to Admission  ?Medication Sig Dispense Refill Last Dose  ? Biotin 5000 MCG TABS Take 5,000 mcg by mouth daily.   09/21/2021 at 0800  ? Calcium Carb-Cholecalciferol (CALTRATE 600+D3 PO) Take 1 tablet by mouth daily.   09/21/2021 at 0800  ? levothyroxine (SYNTHROID) 75 MCG tablet Take 1 tablet by mouth daily.   09/21/2021 at 0800  ? LORazepam (ATIVAN) 0.5 MG tablet Take 0.5 mg by mouth at bedtime as needed for sleep.   09/20/2021 at 1800  ? metoprolol succinate (TOPROL-XL) 50 MG 24 hr tablet Take 50 mg by mouth in the morning and at bedtime.   09/21/2021 at 0800  ? Multiple Vitamin  (MULTI-VITAMIN DAILY PO) Take 1 tablet by mouth daily.   09/20/2021 at 1800  ? Saccharomyces boulardii (PROBIOTIC) 250 MG CAPS Take 1 capsule by mouth daily.   09/21/2021 at 0800  ? vitamin E 400 UNIT capsule Take 400 Units by mouth daily.   09/21/2021 at 0800  ? XARELTO 20 MG TABS tablet Take 20 mg by mouth daily.   09/21/2021 at 0800  ? albuterol (VENTOLIN HFA) 108 (90 Base) MCG/ACT inhaler Inhale 2 puffs into the lungs every 6 (six) hours. 18 g 0 prn at unknown  ? amLODipine (NORVASC) 2.5 MG tablet Take 1 tablet (2.5 mg total) by mouth daily. (Patient not taking: Reported on 09/21/2021) 30 tablet 0 Not Taking  ? ascorbic acid (VITAMIN C) 500 MG tablet Take 1 tablet (500 mg total) by mouth daily. (Patient not taking: Reported on 09/21/2021) 30 tablet 0 Not Taking  ? LAGEVRIO 200 MG CAPS capsule Take 4 capsules by mouth 2 (two) times daily. (Patient not taking: Reported on 09/19/2021)     ? loratadine (CLARITIN) 10 MG tablet Take 10 mg by mouth daily.   prn at unknown  ? predniSONE (DELTASONE) 10 MG tablet 4 tabs po daily for three days (Patient not taking: Reported on 09/19/2021) 12 tablet 0 Not Taking  ? zinc sulfate 220 (50 Zn) MG capsule Take 1 capsule (220 mg total) by mouth daily. (Patient not taking: Reported on 09/21/2021) 30 capsule 0 Not  Taking  ? ?Social History  ? ?Socioeconomic History  ? Marital status: Widowed  ?  Spouse name: Not on file  ? Number of children: Not on file  ? Years of education: Not on file  ? Highest education level: Not on file  ?Occupational History  ? Not on file  ?Tobacco Use  ? Smoking status: Former  ?  Types: Cigarettes  ? Smokeless tobacco: Never  ?Vaping Use  ? Vaping Use: Never used  ?Substance and Sexual Activity  ? Alcohol use: Not Currently  ? Drug use: Never  ? Sexual activity: Not on file  ?Other Topics Concern  ? Not on file  ?Social History Narrative  ? Lives at home independently.  Ambulates steadily.  ? ?Social Determinants of Health  ? ?Financial Resource Strain: Not on  file  ?Food Insecurity: Not on file  ?Transportation Needs: Not on file  ?Physical Activity: Not on file  ?Stress: Not on file  ?Social Connections: Not on file  ?Intimate Partner Violence: Not on file  ?  ?Family History  ?Problem Relation Age of Onset  ? Breast cancer Sister 43  ? Breast cancer Sister   ?     55's  ? CAD Mother   ?  ? ? ?Review of systems complete and found to be negative unless listed above  ? ? ?PHYSICAL EXAM ?General: Pleasant elderly and thin Caucasian female, in no acute distress.  Sitting upright with legs off hospital bed.  ?HEENT:  Normocephalic and atraumatic. ?Neck:  No JVD.  ?Lungs: Normal respiratory effort on room air.  Clear to ascultation ?Heart: Irregularly irregular rhythm with controlled rate. Normal S1 and S2 without gallops or murmurs. Radial & DP pulses 2+ bilaterally. ?Abdomen: Non-distended appearing.  ?Msk: Normal strength and tone for age. ?Extremities: Warm and well perfused. No clubbing, cyanosis.  No peripheral edema.  Right lower extremity with chronic appearing ecchymosis. ?Neuro: Alert and oriented X 3. ?Psych:  Answers questions appropriately.  Anxious mood.  ? ?Labs: ?  ?Lab Results  ?Component Value Date  ? WBC 8.0 09/22/2021  ? HGB 12.9 09/22/2021  ? HCT 39.4 09/22/2021  ? MCV 91.6 09/22/2021  ? PLT 263 09/22/2021  ?  ?Recent Labs  ?Lab 09/21/21 ?1629 09/22/21 ?0543  ?NA 131* 135  ?K 4.3 4.7  ?CL 92* 97*  ?CO2 31 32  ?BUN 18 13  ?CREATININE 0.62 0.56  ?CALCIUM 9.1 8.9  ?PROT 7.0  --   ?BILITOT 0.4  --   ?ALKPHOS 50  --   ?ALT 29  --   ?AST 35  --   ?GLUCOSE 99 94  ? ? ?Lab Results  ?Component Value Date  ? TROPONINI 0.03 ([place]) 06/29/2018  ? ? No results found for: CHOL ?No results found for: HDL ?No results found for: [place] ?No results found for: TRIG ?No results found for: CHOLHDL ?No results found for: LDLDIRECT  ?  ?Radiology: DG Chest 2 View ? ?Result Date: 09/21/2021 ?CLINICAL DATA:  high blood pressure EXAM: CHEST - 2 VIEW COMPARISON:  Chest x-ray dated  September 19, 2021 FINDINGS: Cardiac and mediastinal contours are unchanged. Bibasilar left greater than right opacities, unchanged compared to prior exam and likely due to bronchiectasis. No new parenchymal opacity. No pleural effusion or pneumothorax. IMPRESSION: No acute cardiopulmonary disease. Electronically Signed   By: Yetta Glassman M.D.   On: 09/21/2021 17:01  ? ?CT HEAD WO CONTRAST (5MM) ? ?Result Date: 09/21/2021 ?CLINICAL DATA:  Headache and dizziness all weekend,  hypertension, tachycardia, history of atrial fibrillation EXAM: CT HEAD WITHOUT CONTRAST TECHNIQUE: Contiguous axial images were obtained from the base of the skull through the vertex without intravenous contrast. RADIATION DOSE REDUCTION: This exam was performed according to the departmental dose-optimization program which includes automated exposure control, adjustment of the mA and/or kV according to patient size and/or use of iterative reconstruction technique. COMPARISON:  09/19/2021 FINDINGS: Brain: Stable confluent hypodensities throughout the periventricular and subcortical white matter consistent with chronic small vessel ischemic change. No evidence of acute infarct or hemorrhage. Lateral ventricles and midline structures are stable. No acute extra-axial fluid collections. No mass effect. Vascular: Stable atherosclerosis.  No hyperdense vessel. Skull: Normal. Negative for fracture or focal lesion. Sinuses/Orbits: No acute finding. Other: None. IMPRESSION: 1. Stable head CT, no acute intracranial process. Electronically Signed   By: Randa Ngo M.D.   On: 09/21/2021 17:19  ? ?CT Head Wo Contrast ? ?Result Date: 09/19/2021 ?CLINICAL DATA:  Headache and altered mental status. EXAM: CT HEAD WITHOUT CONTRAST TECHNIQUE: Contiguous axial images were obtained from the base of the skull through the vertex without intravenous contrast. RADIATION DOSE REDUCTION: This exam was performed according to the departmental dose-optimization program which  includes automated exposure control, adjustment of the mA and/or kV according to patient size and/or use of iterative reconstruction technique. COMPARISON:  Head CT 07/01/2021. FINDINGS: Brain: There is mild a

## 2021-09-23 NOTE — Telephone Encounter (Signed)
Called patient to check on her after hospitalization and to inquire about her plans for PT appointment scheduled tomorrow. Patient states she is home but has not gotten good sleep while at the hospital and is not up to coming tomorrow. She confirmed her appointment next Thursday at 10/01/2021 2:30pm.  ? ?Dr. Osvaldo Shipper, MD said via instant chat message on 09/23/2021:  "She was admitted for poorly controlled HTN. Not for back issues. So she may continue with everything else as before." ? ?Luretha Murphy. Ilsa Iha, PT, DPT ?09/23/21, 4:45 PM ? ?

## 2021-09-24 ENCOUNTER — Ambulatory Visit: Payer: Medicare Other | Admitting: Physical Therapy

## 2021-09-24 LAB — CULTURE, BLOOD (ROUTINE X 2)
Culture: NO GROWTH
Culture: NO GROWTH
Special Requests: ADEQUATE
Special Requests: ADEQUATE

## 2021-09-27 ENCOUNTER — Other Ambulatory Visit: Payer: Self-pay

## 2021-09-27 DIAGNOSIS — E039 Hypothyroidism, unspecified: Secondary | ICD-10-CM | POA: Diagnosis not present

## 2021-09-27 DIAGNOSIS — Z79899 Other long term (current) drug therapy: Secondary | ICD-10-CM | POA: Insufficient documentation

## 2021-09-27 DIAGNOSIS — I10 Essential (primary) hypertension: Secondary | ICD-10-CM | POA: Insufficient documentation

## 2021-09-27 DIAGNOSIS — E871 Hypo-osmolality and hyponatremia: Secondary | ICD-10-CM | POA: Diagnosis not present

## 2021-09-27 DIAGNOSIS — Z8616 Personal history of COVID-19: Secondary | ICD-10-CM | POA: Diagnosis not present

## 2021-09-27 DIAGNOSIS — R220 Localized swelling, mass and lump, head: Secondary | ICD-10-CM | POA: Diagnosis present

## 2021-09-27 DIAGNOSIS — I4891 Unspecified atrial fibrillation: Secondary | ICD-10-CM | POA: Diagnosis not present

## 2021-09-27 DIAGNOSIS — Z7901 Long term (current) use of anticoagulants: Secondary | ICD-10-CM | POA: Diagnosis not present

## 2021-09-27 LAB — BASIC METABOLIC PANEL
Anion gap: 8 (ref 5–15)
BUN: 16 mg/dL (ref 8–23)
CO2: 31 mmol/L (ref 22–32)
Calcium: 9.4 mg/dL (ref 8.9–10.3)
Chloride: 89 mmol/L — ABNORMAL LOW (ref 98–111)
Creatinine, Ser: 0.67 mg/dL (ref 0.44–1.00)
GFR, Estimated: >60 mL/min (ref >60)
Glucose, Bld: 102 mg/dL — ABNORMAL HIGH (ref 70–99)
Potassium: 4.3 mmol/L (ref 3.5–5.1)
Sodium: 128 mmol/L — ABNORMAL LOW (ref 135–145)

## 2021-09-27 LAB — CBC
HCT: 38.5 % (ref 36.0–46.0)
Hemoglobin: 12.5 g/dL (ref 12.0–15.0)
MCH: 29.6 pg (ref 26.0–34.0)
MCHC: 32.5 g/dL (ref 30.0–36.0)
MCV: 91.2 fL (ref 80.0–100.0)
Platelets: 260 10*3/uL (ref 150–400)
RBC: 4.22 MIL/uL (ref 3.87–5.11)
RDW: 13.2 % (ref 11.5–15.5)
WBC: 4.7 10*3/uL (ref 4.0–10.5)
nRBC: 0 % (ref 0.0–0.2)

## 2021-09-27 NOTE — ED Triage Notes (Signed)
Pt arrived POV from home and states she was just dc for same on Friday. Pt states she has a headache dizziness and high blood pressure. Pt states she has taken her meds she was sent home on and does not believe they are working.  ?

## 2021-09-27 NOTE — ED Notes (Signed)
Pt up to desk, concerned about wait time, explanation of triage and wait process provided to pt.  ?

## 2021-09-27 NOTE — ED Notes (Signed)
Per ems pt has been checking blood pressure today, has htn. Per ems pt took her lorazepam x 1 and ntg x3. Per ems pt with blood pressure 160/89 currently. Per ems pt denies pain, shob.  ?

## 2021-09-28 ENCOUNTER — Emergency Department: Payer: Medicare Other

## 2021-09-28 ENCOUNTER — Emergency Department
Admission: EM | Admit: 2021-09-28 | Discharge: 2021-09-28 | Disposition: A | Discharge status: Home / Self Care | Payer: Medicare Other | Attending: Emergency Medicine | Admitting: Emergency Medicine

## 2021-09-28 DIAGNOSIS — E871 Hypo-osmolality and hyponatremia: Secondary | ICD-10-CM

## 2021-09-28 DIAGNOSIS — I10 Essential (primary) hypertension: Secondary | ICD-10-CM | POA: Diagnosis not present

## 2021-09-28 LAB — URINALYSIS, ROUTINE W REFLEX MICROSCOPIC
Bacteria, UA: NONE SEEN
Bilirubin Urine: NEGATIVE
Glucose, UA: NEGATIVE mg/dL
Hgb urine dipstick: NEGATIVE
Ketones, ur: NEGATIVE mg/dL
Nitrite: NEGATIVE
Protein, ur: NEGATIVE mg/dL
Specific Gravity, Urine: 1.009 (ref 1.005–1.030)
Squamous Epithelial / HPF: NONE SEEN (ref 0–5)
pH: 6 (ref 5.0–8.0)

## 2021-09-28 LAB — CATECHOLAMINES,UR.,FREE,24 HR
Dopamine, Rand Ur: 100 ug/L
Dopamine, Ur, 24Hr: 120 ug/24 hr (ref 0–510)
Epinephrine, Rand Ur: 6 ug/L
Epinephrine, U, 24Hr: 7 ug/24 hr (ref 0–20)
Norepinephrine, Rand Ur: 36 ug/L
Norepinephrine,U,24H: 43 ug/24 hr (ref 0–135)
Total Volume: 1200

## 2021-09-28 IMAGING — CT CT HEAD W/O CM
4 series · 17 of 47 positions shown, 19 images · non-contrast
Comparison: [DATE]

CLINICAL DATA: Headache, dizziness, hypertension



[Series 2: head wo · axial · 0.45mm/px · z∈[-104,+11]mm · 7 of 31 slices shown, 9 images]
[im 4/31  brain]
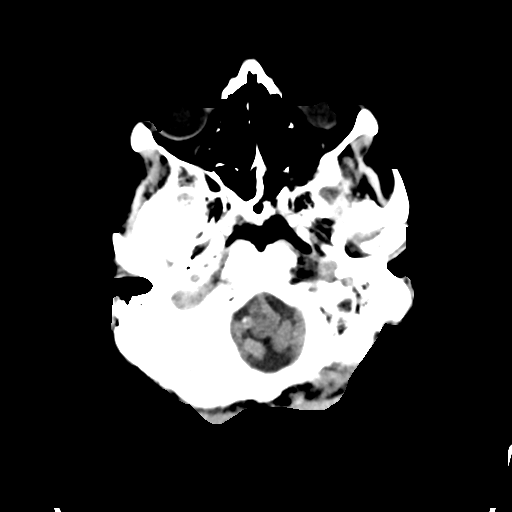
[im 4/31  bone]
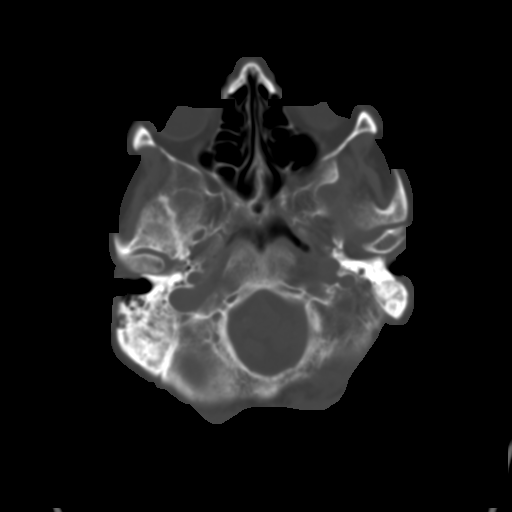
[im 8/31  brain]
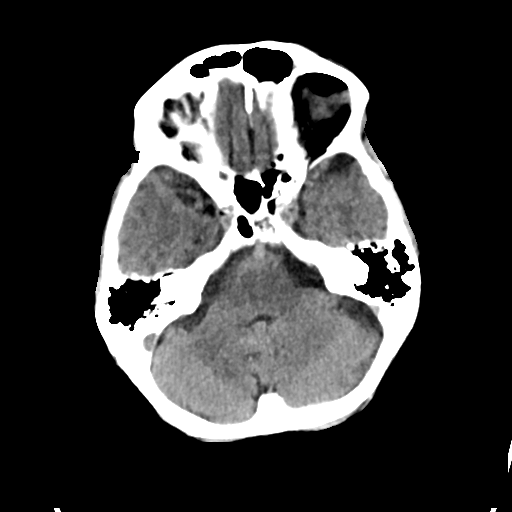
[im 12/31  brain]
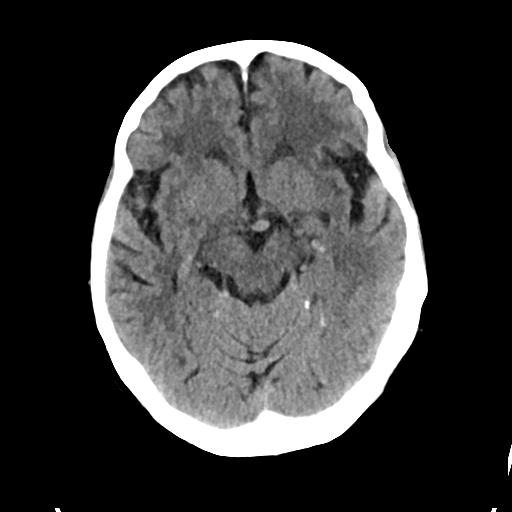
[im 16/31  brain]
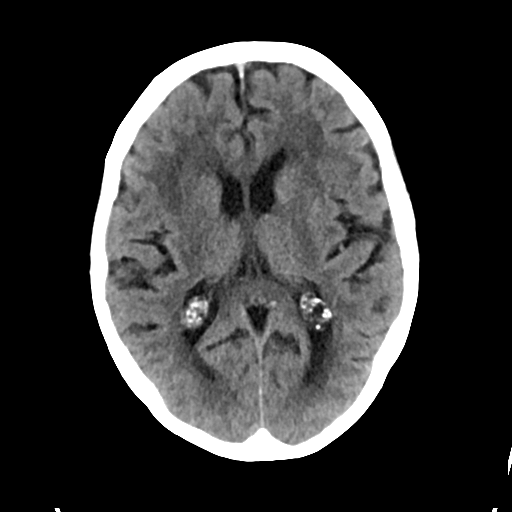
[im 19/31  brain]
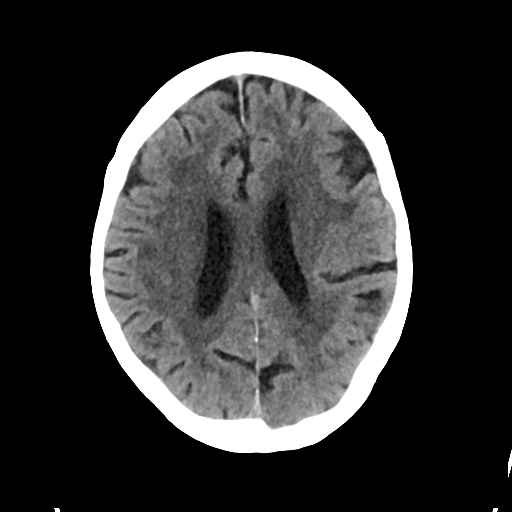
[im 19/31  bone]
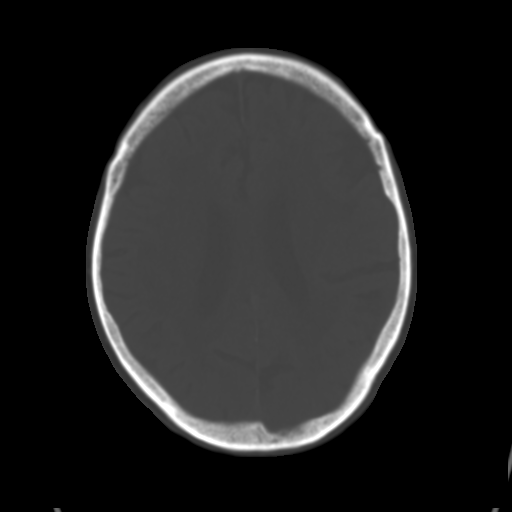
[im 23/31  brain]
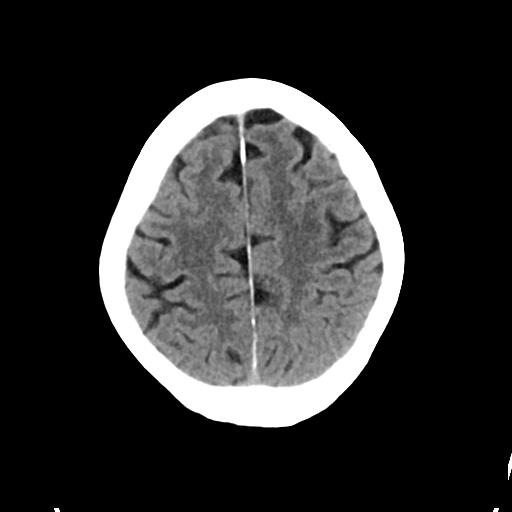
[im 27/31  brain]
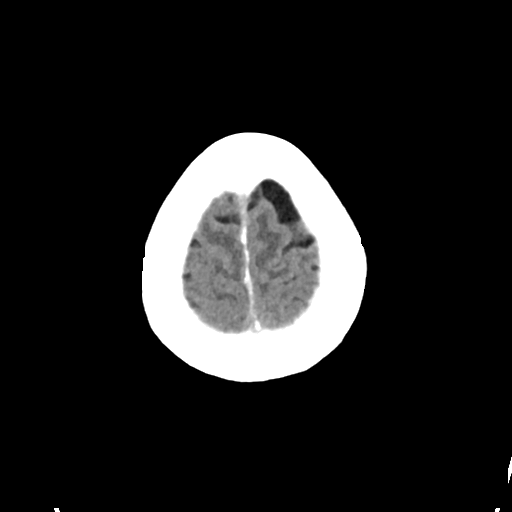

[Series 3: head bone · axial · 0.45mm/px · z∈[-105,-51]mm · 4 of 78 slices shown]
[im 8/78  bone]
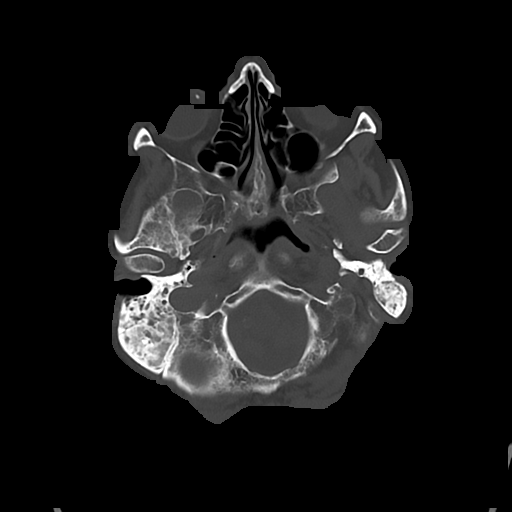
[im 16/78  bone]
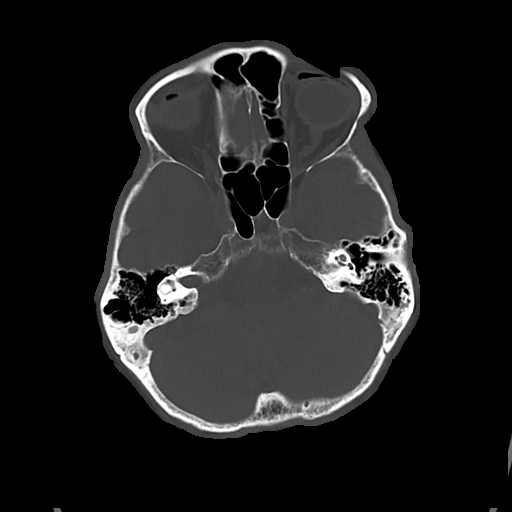
[im 24/78  bone]
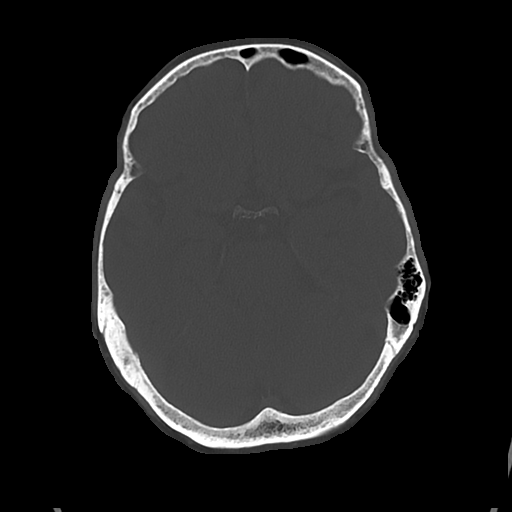
[im 35/78  bone]
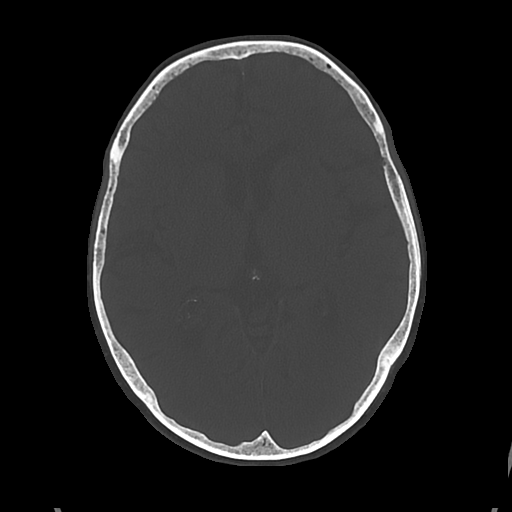

[Series 4: cor soft · coronal · 0.32mm/px · 3 of 64 slices shown]
[im 22/64  brain]
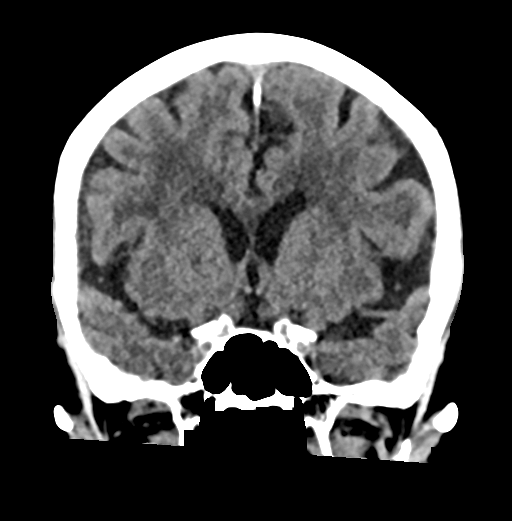
[im 29/64  brain]
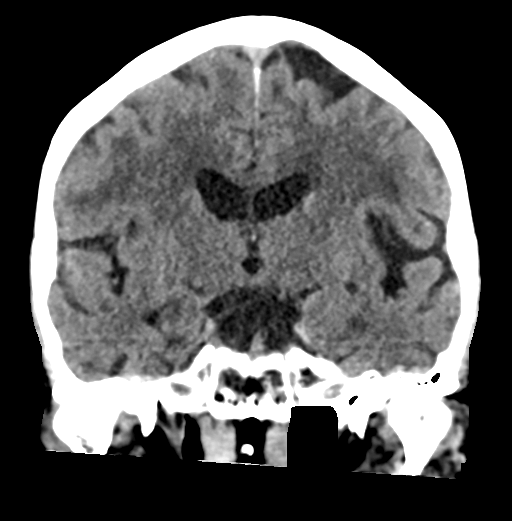
[im 36/64  brain]
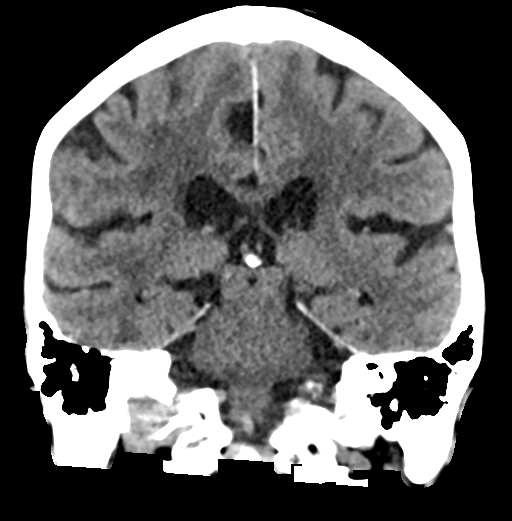

[Series 5: sag soft · sagittal · 0.33mm/px · 3 of 52 slices shown]
[im 18/52  brain]
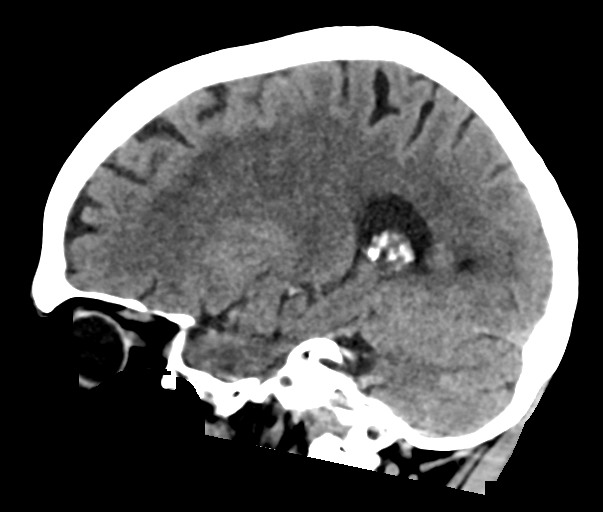
[im 26/52  brain]
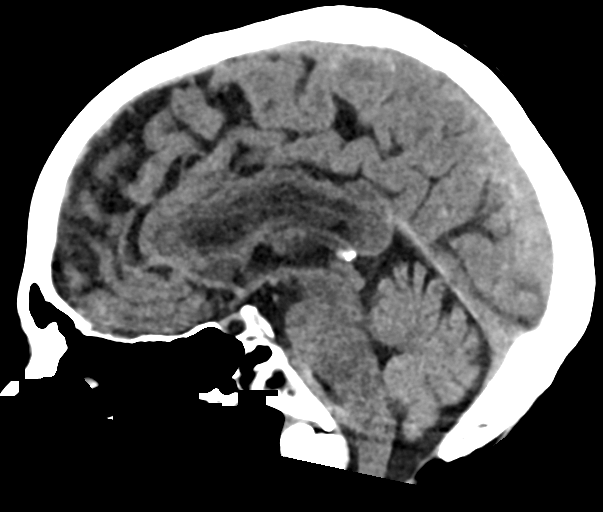
[im 35/52  brain]
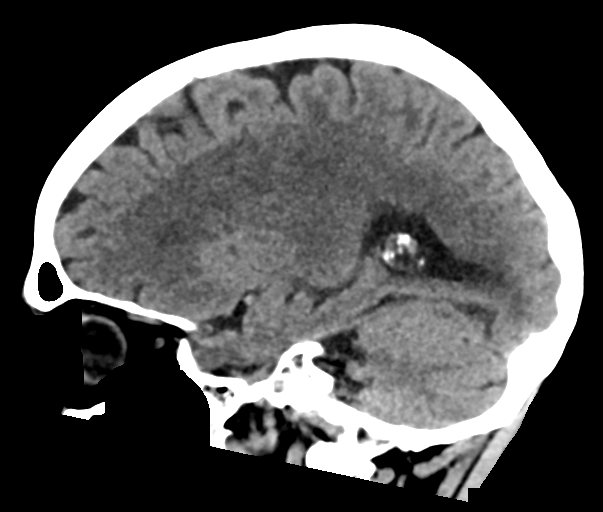

[17 of 47 positions shown; findings below may reference images not displayed]

FINDINGS: Brain: No evidence of acute infarction, hemorrhage, hydrocephalus,
extra-axial collection or mass lesion/mass effect.

Subcortical white matter and periventricular small vessel ischemic
changes.

Vascular: Intracranial atherosclerosis.

Skull: Normal. Negative for fracture or focal lesion.

Sinuses/Orbits: The visualized paranasal sinuses are essentially
clear. The mastoid air cells are unopacified.

Other: None.
IMPRESSION: No evidence of acute intracranial abnormality. Small vessel ischemic
changes.

## 2021-09-28 MED ORDER — SODIUM CHLORIDE 0.9 % IV BOLUS
1000.0000 mL | Freq: Once | INTRAVENOUS | Status: DC
Start: 2021-09-28 — End: 2021-09-28

## 2021-09-28 NOTE — Discharge Instructions (Addendum)
Please have your PCP recheck your sodium at your next visit in the next 5 to 7 days. ?

## 2021-09-28 NOTE — ED Provider Notes (Signed)
? ?[facility] ?Provider Note ? ? ? Event Date/Time  ? First MD Initiated Contact with Patient 09/28/21 0015   ?  (approximate) ? ? ?History  ? ?Hypertension ? ? ?HPI ? ?Kaylee Santiago is a 87 y.o. female  with history of hypothyroid, hypertension, history of COVID-19 infection in December 2022, atrial fibrillation currently on Xarelto and metoprolol recently admitted for hypertension 3/13 to 3/15 discharged on lisinopril and Imdur recommended by cardiology who presents for evaluation of elevated blood pressures today as well as "pressure in my head".  She states she took 3 2.5 mg of Imdur as recommended by her cardiologist and is feeling much better after going to emergency room but went to get checked out anyways.  She denies any specific headache which is felt has "pressure or sweatiness in my head".  No vision changes, vertigo, dizziness, chest pain, cough, shortness of breath, nausea, vomiting, diarrhea, rash, urinary symptoms or any focal extremity weakness numbness or tingling.  No other acute concerns at this time. ? ?  ? ? ?Physical Exam  ?Triage Vital Signs: ?ED Triage Vitals  ?Enc Vitals Group  ?   BP 09/27/21 2226 (!) 161/85  ?   Pulse Rate 09/27/21 2226 88  ?   Resp 09/27/21 2226 16  ?   Temp 09/27/21 2226 97.6 ?F (36.4 ?C)  ?   Temp Source 09/27/21 2226 Oral  ?   SpO2 09/27/21 2226 96 %  ?   Weight 09/27/21 2225 120 lb (54.4 kg)  ?   Height 09/27/21 2225 5\' 11"  (1.803 m)  ?   Head Circumference --   ?   Peak Flow --   ?   Pain Score 09/27/21 2236 0  ?   Pain Loc --   ?   Pain Edu? --   ?   Excl. in [place]? --   ? ? ?Most recent vital signs: ?Vitals:  ? 09/27/21 2226 09/27/21 2235  ?BP: (!) 161/85   ?Pulse: 88 86  ?Resp: 16   ?Temp: 97.6 ?F (36.4 ?C) 98.2 ?F (36.8 ?C)  ?SpO2: 96%   ? ? ?General: Awake, no distress.  ?CV:  Good peripheral perfusion.  2+ radial pulses. ?Resp:  Normal effort.  Clear bilaterally. ?Abd:  No distention.  Soft. ?Other:  Cranial nerves II through XII grossly  intact.  No pronator drift.  No finger dysmetria.  Symmetric 5/5 strength of all extremities.  Sensation intact to light touch in all extremities.  Unremarkable unassisted gait. ? ? ? ?ED Results / Procedures / Treatments  ?Labs ?(all labs ordered are listed, but only abnormal results are displayed) ?Labs Reviewed  ?BASIC METABOLIC PANEL - Abnormal; Notable for the following components:  ?    Result Value  ? Sodium 128 (*)   ? Chloride 89 (*)   ? Glucose, Bld 102 (*)   ? All other components within normal limits  ?URINALYSIS, ROUTINE W REFLEX MICROSCOPIC - Abnormal; Notable for the following components:  ? Color, Urine YELLOW (*)   ? APPearance CLEAR (*)   ? Leukocytes,Ua TRACE (*)   ? All other components within normal limits  ?CBC  ?CBG MONITORING, ED  ? ? ? ?EKG ? ?ECG is remarkable for A-fib with a ventricular rate of 81, incomplete right bundle branch block with otherwise unremarkable intervals and no changes of acute ischemia as some nonspecific changes noted in V2, V3 and lead III are similar to ECG from 3/13 without any new changes  today. ? ? ?RADIOLOGY ? ?CT head on my interpretation without evidence of ischemia, edema, mass effect hemorrhage or other acute process.  I also reviewed radiology interpretation and agree with the findings of same in addition to some chronic appearing small vessel ischemic changes. ? ?PROCEDURES: ? ?Critical Care performed: No ? ?Procedures ? ? ? ?MEDICATIONS ORDERED IN ED: ?Medications  ?sodium chloride 0.9 % bolus 1,000 mL (has no administration in time range)  ? ? ? ?IMPRESSION / MDM / ASSESSMENT AND PLAN / ED COURSE  ?I reviewed the triage vital signs and the nursing notes. ?             ?               ? ?Differential diagnosis includes, but is not limited to, symptomatic hypertension, metabolic derangement with low suspicion at this time based on exam for CVA.  Patient has no chest pain ECG is not suggestive of cardiac ischemia.  No history exam features to suggest toxic  ingestion, withdrawal, trauma or deep space infection in the head or neck.  CBC without leukocytosis or acute anemia.  BMP shows normal kidney function and a sodium of 128 which appears to be fairly chronic with sodiums ranging between 1 26-1 3 5  over the last several months.  Low suspicion which is contributing to presentation today. ? ?CT head on my interpretation without evidence of ischemia, edema, mass effect hemorrhage or other acute process.  I also reviewed radiology interpretation and agree with the findings of same in addition to some chronic appearing small vessel ischemic changes. ? ?I will suspicion for evidence of endorgan damage and patient without any evidence of other complication from elevated blood pressures at this time.  At this point I think she is stable for discharge with outpatient follow-up.  Advised to have her sodium monitored with her PCP.  Discharged in stable condition.  Strict return precautions advised and discussed. ? ?  ? ? ?FINAL CLINICAL IMPRESSION(S) / ED DIAGNOSES  ? ?Final diagnoses:  ?Hypertension, unspecified type  ?Hyponatremia  ? ? ? ?Rx / DC Orders  ? ?ED Discharge Orders   ? ? None  ? ?  ? ? ? ?Note:  This document was prepared using Dragon voice recognition software and may include unintentional dictation errors. ?  ?Lucrezia Starch, MD ?09/28/21 0139 ? ?

## 2021-09-29 ENCOUNTER — Encounter: Payer: Medicare Other | Admitting: Physical Therapy

## 2021-09-30 LAB — METANEPHRINES, URINE, 24 HOUR
Metaneph Total, Ur: 52 ug/L
Metanephrines, 24H Ur: 62 ug/24 hr (ref 36–209)
Normetanephrine, 24H Ur: 270 ug/24 hr (ref 131–612)
Normetanephrine, Ur: 225 ug/L
Total Volume: 1200

## 2021-10-01 ENCOUNTER — Ambulatory Visit: Payer: Medicare Other | Admitting: Physical Therapy

## 2021-10-04 LAB — ALDOSTERONE + RENIN ACTIVITY W/ RATIO
ALDO / PRA Ratio: <3.5 (ref 0.0–30.0)
Aldosterone: <1 ng/dL (ref 0.0–30.0)
PRA LC/MS/MS: 0.288 ng/mL/hr (ref 0.167–5.380)

## 2021-10-06 ENCOUNTER — Encounter: Payer: Medicare Other | Admitting: Physical Therapy

## 2021-10-08 ENCOUNTER — Other Ambulatory Visit: Payer: Self-pay

## 2021-10-08 ENCOUNTER — Emergency Department: Payer: Medicare Other

## 2021-10-08 ENCOUNTER — Inpatient Hospital Stay
Admission: EM | Admit: 2021-10-08 | Discharge: 2021-10-10 | DRG: 645 | Disposition: A | Discharge status: Home / Self Care | Payer: Medicare Other | Attending: Internal Medicine | Admitting: Internal Medicine

## 2021-10-08 ENCOUNTER — Encounter: Payer: Medicare Other | Admitting: Physical Therapy

## 2021-10-08 DIAGNOSIS — E039 Hypothyroidism, unspecified: Secondary | ICD-10-CM | POA: Diagnosis present

## 2021-10-08 DIAGNOSIS — I10 Essential (primary) hypertension: Secondary | ICD-10-CM | POA: Diagnosis present

## 2021-10-08 DIAGNOSIS — E222 Syndrome of inappropriate secretion of antidiuretic hormone: Principal | ICD-10-CM | POA: Diagnosis present

## 2021-10-08 DIAGNOSIS — E871 Hypo-osmolality and hyponatremia: Principal | ICD-10-CM | POA: Diagnosis present

## 2021-10-08 DIAGNOSIS — Z7901 Long term (current) use of anticoagulants: Secondary | ICD-10-CM

## 2021-10-08 DIAGNOSIS — E860 Dehydration: Secondary | ICD-10-CM | POA: Diagnosis present

## 2021-10-08 DIAGNOSIS — R519 Headache, unspecified: Secondary | ICD-10-CM | POA: Diagnosis not present

## 2021-10-08 DIAGNOSIS — I480 Paroxysmal atrial fibrillation: Secondary | ICD-10-CM | POA: Diagnosis present

## 2021-10-08 DIAGNOSIS — Z803 Family history of malignant neoplasm of breast: Secondary | ICD-10-CM

## 2021-10-08 DIAGNOSIS — Z20822 Contact with and (suspected) exposure to covid-19: Secondary | ICD-10-CM | POA: Diagnosis present

## 2021-10-08 DIAGNOSIS — I272 Pulmonary hypertension, unspecified: Secondary | ICD-10-CM | POA: Diagnosis present

## 2021-10-08 DIAGNOSIS — R200 Anesthesia of skin: Secondary | ICD-10-CM

## 2021-10-08 DIAGNOSIS — Z79899 Other long term (current) drug therapy: Secondary | ICD-10-CM

## 2021-10-08 DIAGNOSIS — Z8249 Family history of ischemic heart disease and other diseases of the circulatory system: Secondary | ICD-10-CM

## 2021-10-08 DIAGNOSIS — Z79890 Hormone replacement therapy (postmenopausal): Secondary | ICD-10-CM

## 2021-10-08 DIAGNOSIS — J479 Bronchiectasis, uncomplicated: Secondary | ICD-10-CM | POA: Diagnosis present

## 2021-10-08 DIAGNOSIS — Z881 Allergy status to other antibiotic agents status: Secondary | ICD-10-CM

## 2021-10-08 DIAGNOSIS — Z87891 Personal history of nicotine dependence: Secondary | ICD-10-CM

## 2021-10-08 DIAGNOSIS — I4819 Other persistent atrial fibrillation: Secondary | ICD-10-CM | POA: Diagnosis present

## 2021-10-08 DIAGNOSIS — Z885 Allergy status to narcotic agent status: Secondary | ICD-10-CM

## 2021-10-08 LAB — CBC
HCT: 41.5 % (ref 36.0–46.0)
Hemoglobin: 13.9 g/dL (ref 12.0–15.0)
MCH: 30 pg (ref 26.0–34.0)
MCHC: 33.5 g/dL (ref 30.0–36.0)
MCV: 89.6 fL (ref 80.0–100.0)
Platelets: 263 10*3/uL (ref 150–400)
RBC: 4.63 MIL/uL (ref 3.87–5.11)
RDW: 12.9 % (ref 11.5–15.5)
WBC: 6 10*3/uL (ref 4.0–10.5)
nRBC: 0 % (ref 0.0–0.2)

## 2021-10-08 LAB — COMPREHENSIVE METABOLIC PANEL
ALT: 29 U/L (ref 0–44)
AST: 37 U/L (ref 15–41)
Albumin: 4.7 g/dL (ref 3.5–5.0)
Alkaline Phosphatase: 55 U/L (ref 38–126)
Anion gap: 7 (ref 5–15)
BUN: 14 mg/dL (ref 8–23)
CO2: 30 mmol/L (ref 22–32)
Calcium: 9.1 mg/dL (ref 8.9–10.3)
Chloride: 85 mmol/L — ABNORMAL LOW (ref 98–111)
Creatinine, Ser: 0.65 mg/dL (ref 0.44–1.00)
GFR, Estimated: >60 mL/min (ref >60)
Glucose, Bld: 116 mg/dL — ABNORMAL HIGH (ref 70–99)
Potassium: 4.6 mmol/L (ref 3.5–5.1)
Sodium: 122 mmol/L — ABNORMAL LOW (ref 135–145)
Total Bilirubin: 0.8 mg/dL (ref 0.3–1.2)
Total Protein: 8.1 g/dL (ref 6.5–8.1)

## 2021-10-08 IMAGING — CT CT HEAD W/O CM
4 series · 17 of 47 positions shown, 19 images · non-contrast
Comparison: [DATE]

CLINICAL DATA: Elevated blood pressure with unsteady gait.



[Series 2: head wo · axial · 0.45mm/px · z∈[-104,+21]mm · 7 of 35 slices shown, 9 images]
[im 5/35  brain]
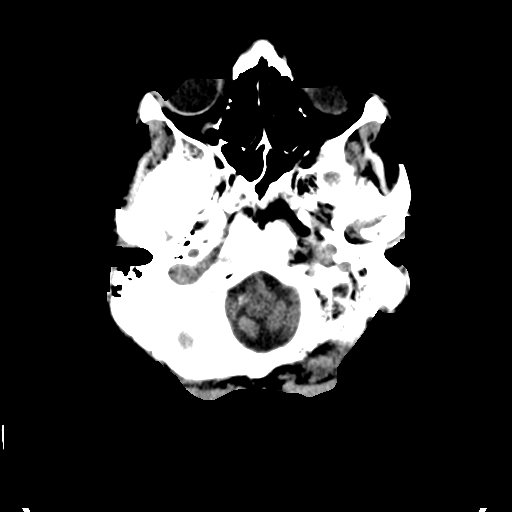
[im 5/35  bone]
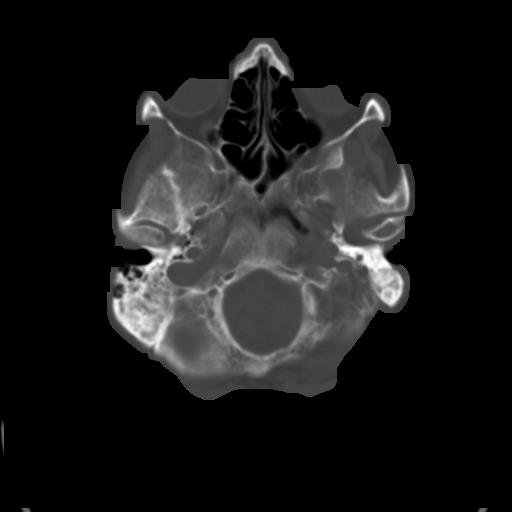
[im 9/35  brain]
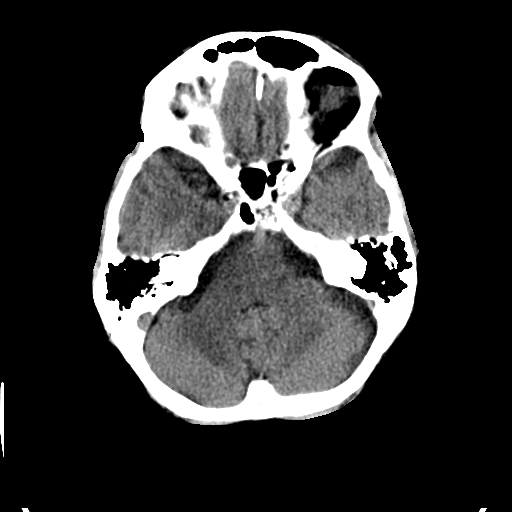
[im 13/35  brain]
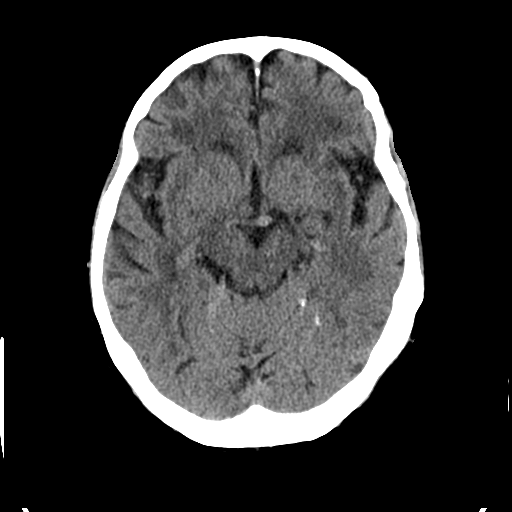
[im 18/35  brain]
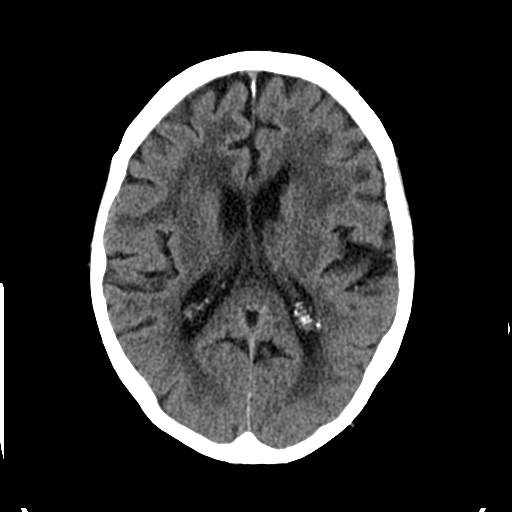
[im 22/35  brain]
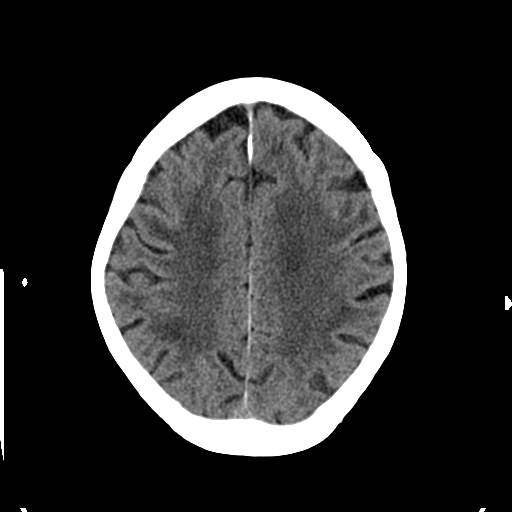
[im 22/35  bone]
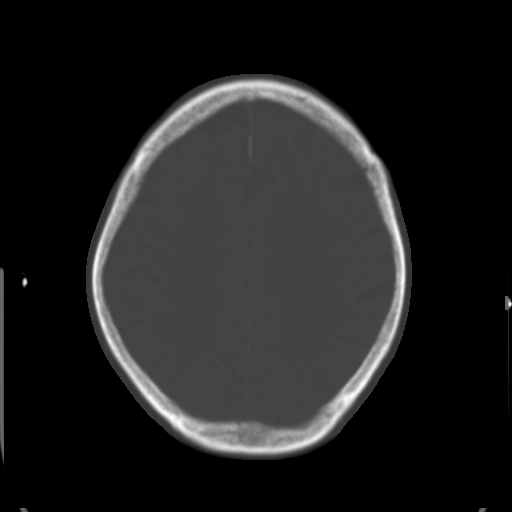
[im 26/35  brain]
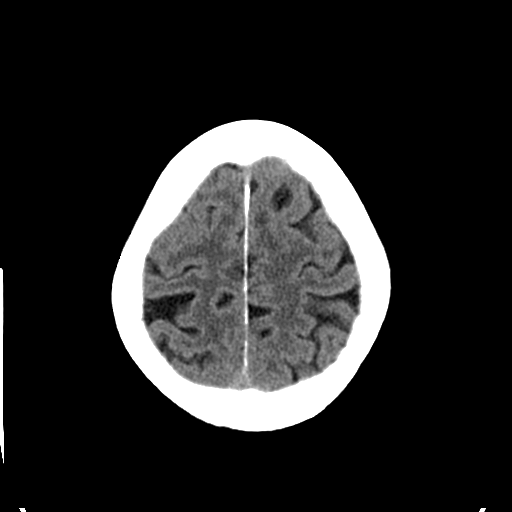
[im 30/35  brain]
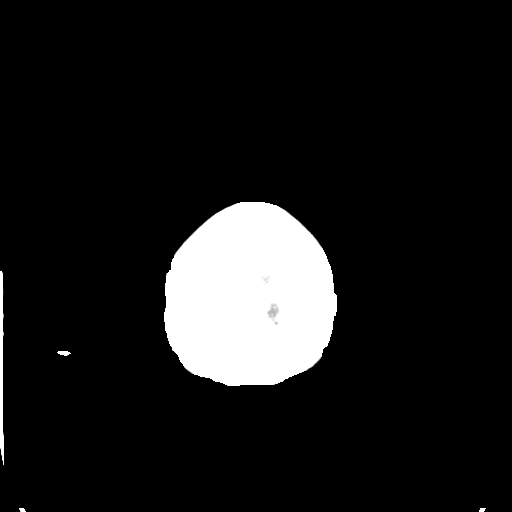

[Series 3: head bone · axial · 0.45mm/px · z∈[-108,-48]mm · 4 of 87 slices shown]
[im 9/87  bone]
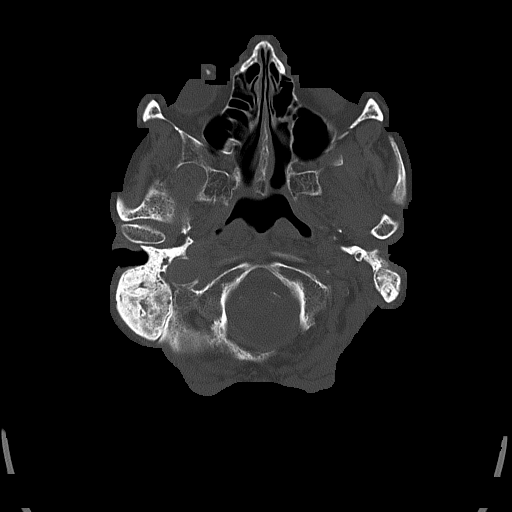
[im 18/87  bone]
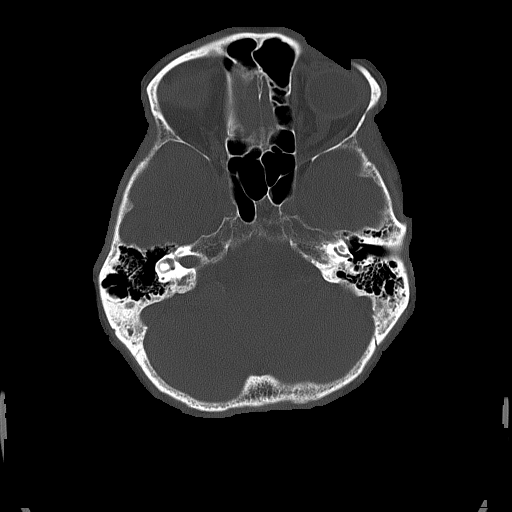
[im 26/87  bone]
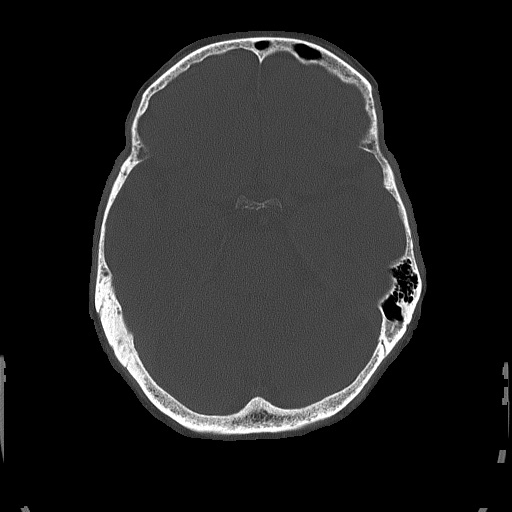
[im 39/87  bone]
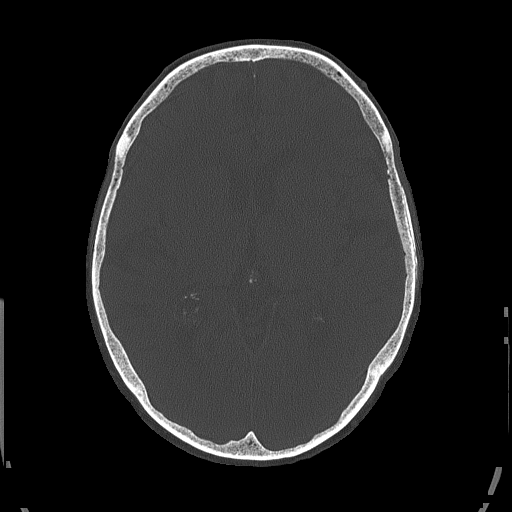

[Series 4: coronal soft tissue · coronal · 0.35mm/px · 3 of 74 slices shown]
[im 25/74  brain]
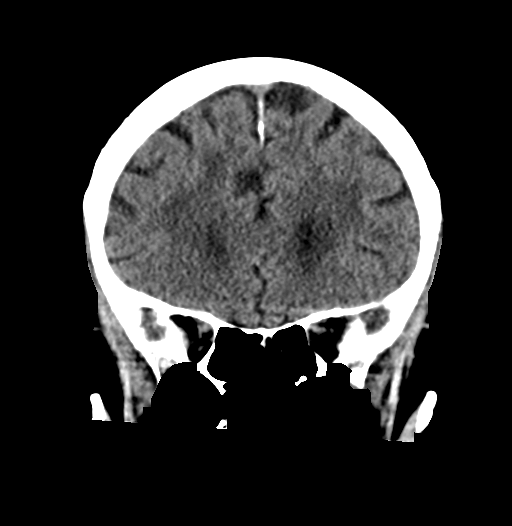
[im 33/74  brain]
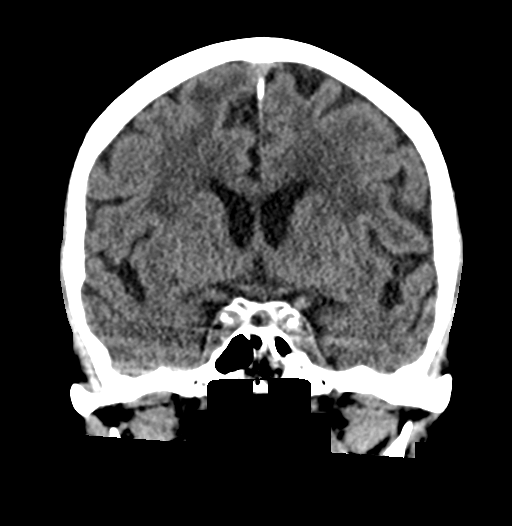
[im 41/74  brain]
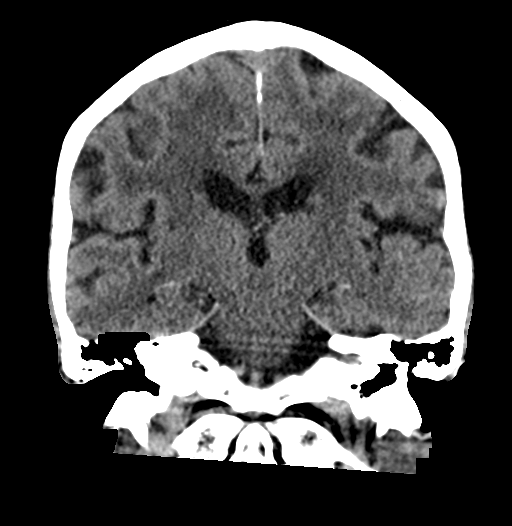

[Series 5: sagittal soft tissue · sagittal · 0.37mm/px · 3 of 57 slices shown]
[im 19/57  brain]
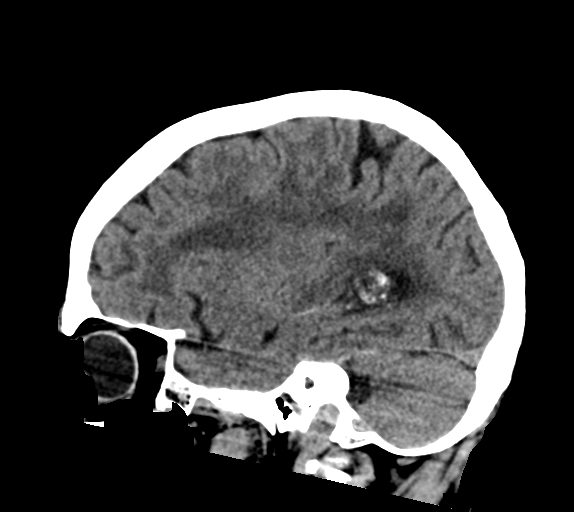
[im 29/57  brain]
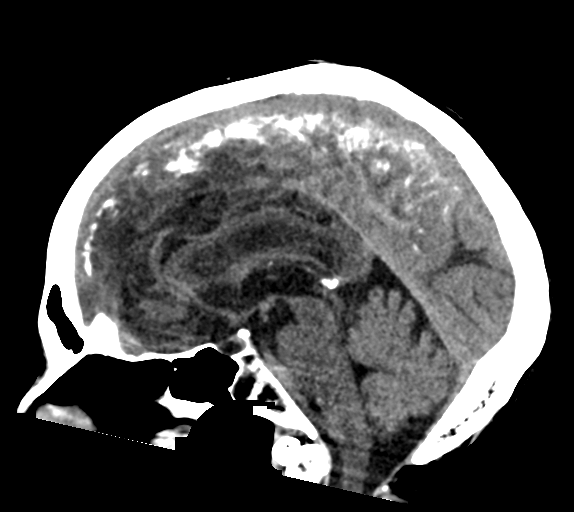
[im 38/57  brain]
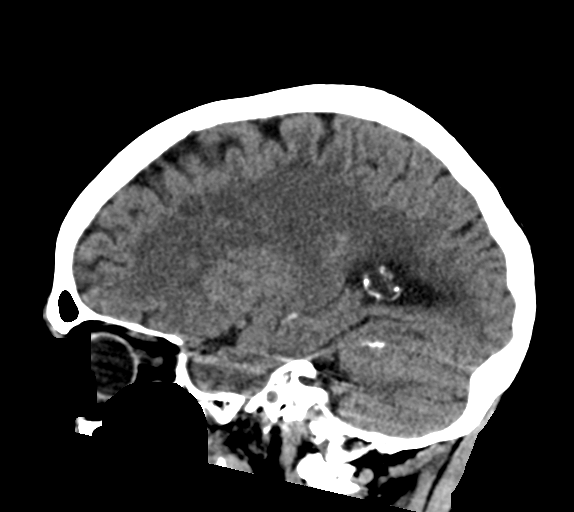

[17 of 47 positions shown; findings below may reference images not displayed]

FINDINGS: Brain: There is mild cerebral atrophy with widening of the
extra-axial spaces and ventricular dilatation.
There are areas of decreased attenuation within the white matter
tracts of the supratentorial brain, consistent with microvascular
disease changes.

Vascular: No hyperdense vessel or unexpected calcification.

Skull: Normal. Negative for fracture or focal lesion.

Sinuses/Orbits: No acute finding.

Other: None.
IMPRESSION: Generalized cerebral atrophy with chronic white matter small vessel
ischemic changes.

## 2021-10-08 NOTE — ED Provider Notes (Signed)
? ?[facility] ?Provider Note ? ? ? Event Date/Time  ? First MD Initiated Contact with Patient 10/08/21 2300   ?  (approximate) ? ? ?History  ? ?No chief complaint on file. ? ? ?HPI ? ?Kaylee Santiago is a 87 y.o. female history of atrial fibrillation on Xarelto, hypertension, hypothyroidism who presents to the emergency department with family for complaints of generalized headache that has been ongoing all day today.  She has tried taking Tylenol, Motrin and Claritin without much relief.  She states around 8 PM tonight that she noticed that her left arm felt numb and was "lazy".  She feels like this is improving but not resolved.  She denies previous history of stroke.  No head injury.  No fever, chest pain, shortness of breath.  She states she is also felt confused like she is in a "fog".  She states that she has been monitoring her blood pressure closely at home and it became significantly elevated and was as high as 240/105.  States she contacted Dr. Saralyn Pilar with cardiology who recommended she come to the emergency department.  She is on metoprolol and lisinopril and reports compliance.  She states Dr. Saralyn Pilar told her to take an extra lisinopril prior to coming to the department. ? ? ?History provided by patient and family. ? ? ? ?Past Medical History:  ?Diagnosis Date  ? Atrial fibrillation ([place])   ? Atrial fibrillation ([place])   ? Back pain   ? Dysrhythmia   ? Hypertension   ? Hypothyroidism   ? ? ?Past Surgical History:  ?Procedure Laterality Date  ? ABDOMINAL HYSTERECTOMY    ? BREAST EXCISIONAL BIOPSY Right 70's  ? CATARACT EXTRACTION W/ INTRAOCULAR LENS  IMPLANT, BILATERAL    ? KYPHOPLASTY N/A 12/21/2018  ? Procedure: KYPHOPLASTY L1;  Surgeon: Hessie Knows, MD;  Location: [facility] ORS;  Service: Orthopedics;  Laterality: N/A;  ? ? ?MEDICATIONS:  ?Prior to Admission medications   ?Medication Sig Start Date End Date Taking? Authorizing Provider  ?albuterol (VENTOLIN HFA) 108 (90 Base)  MCG/ACT inhaler Inhale 2 puffs into the lungs every 6 (six) hours. 07/04/21   Loletha Grayer, MD  ?Biotin 5000 MCG TABS Take 5,000 mcg by mouth daily.    [provider]  ?Calcium Carb-Cholecalciferol (CALTRATE 600+D3 PO) Take 1 tablet by mouth daily.    [provider]  ?levothyroxine (SYNTHROID) 75 MCG tablet Take 1 tablet by mouth daily. 03/03/21   [provider]  ?lisinopril (ZESTRIL) 2.5 MG tablet Take 1 tablet (2.5 mg total) by mouth daily. 09/23/21   Bonnielee Haff, MD  ?loratadine (CLARITIN) 10 MG tablet Take 10 mg by mouth daily. 07/09/21   [provider]  ?LORazepam (ATIVAN) 0.5 MG tablet Take 0.5 mg by mouth at bedtime as needed for sleep.    [provider]  ?metoprolol succinate (TOPROL-XL) 50 MG 24 hr tablet Take 50 mg by mouth in the morning and at bedtime. 12/26/17   [provider]  ?Multiple Vitamin (MULTI-VITAMIN DAILY PO) Take 1 tablet by mouth daily.    [provider]  ?Saccharomyces boulardii (PROBIOTIC) 250 MG CAPS Take 1 capsule by mouth daily.    [provider]  ?vitamin E 400 UNIT capsule Take 400 Units by mouth daily.    [provider]  ?XARELTO 20 MG TABS tablet Take 20 mg by mouth daily. 05/12/21   [provider]  ? ? ?Physical Exam  ? ?Triage Vital Signs: ?ED Triage Vitals  ?  Enc Vitals Group  ?   BP 10/08/21 2223 (!) 189/98  ?   Pulse Rate 10/08/21 2223 66  ?   Resp 10/08/21 2223 16  ?   Temp 10/08/21 2223 97.7 ?F (36.5 ?C)  ?   Temp Source 10/08/21 2223 Oral  ?   SpO2 10/08/21 2223 98 %  ?   Weight 10/08/21 2224 120 lb (54.4 kg)  ?   Height 10/08/21 2224 5\' 11"  (1.803 m)  ?   Head Circumference --   ?   Peak Flow --   ?   Pain Score 10/08/21 2222 9  ?   Pain Loc --   ?   Pain Edu? --   ?   Excl. in [place]? --   ? ? ?Most recent vital signs: ?Vitals:  ? 10/08/21 2300 10/09/21 0000  ?BP: (!) 161/78 (!) 159/74  ?Pulse: 84 86  ?Resp: (P) 16 16  ?Temp:  97.6 ?F (36.4 ?C)  ?SpO2: 97% 98%   ? ? ?CONSTITUTIONAL: Alert and oriented x 4 and responds appropriately to questions. Well-appearing; well-nourished ?HEAD: Normocephalic, atraumatic ?EYES: Conjunctivae clear, pupils appear equal, sclera nonicteric ?ENT: normal nose; moist mucous membranes ?NECK: Supple, normal ROM ?CARD: Irregularly irregular and rate controlled; S1 and S2 appreciated; no murmurs, no clicks, no rubs, no gallops ?RESP: Normal chest excursion without splinting or tachypnea; breath sounds clear and equal bilaterally; no wheezes, no rhonchi, no rales, no hypoxia or respiratory distress, speaking full sentences ?ABD/GI: Normal bowel sounds; non-distended; soft, non-tender, no rebound, no guarding, no peritoneal signs ?BACK: The back appears normal ?EXT: Normal ROM in all joints; no deformity noted, no edema; no cyanosis ?SKIN: Normal color for age and race; warm; no rash on exposed skin ?NEURO: Moves all extremities equally, normal speech, reports normal sensation diffusely, cranial nerves II through XII intact, normal gait, no pronator drift ?PSYCH: The patient's mood and manner are appropriate. ? ? ?ED Results / Procedures / Treatments  ? ?LABS: ?(all labs ordered are listed, but only abnormal results are displayed) ?Labs Reviewed  ?COMPREHENSIVE METABOLIC PANEL - Abnormal; Notable for the following components:  ?    Result Value  ? Sodium 122 (*)   ? Chloride 85 (*)   ? Glucose, Bld 116 (*)   ? All other components within normal limits  ?URINALYSIS, ROUTINE W REFLEX MICROSCOPIC - Abnormal; Notable for the following components:  ? Color, Urine STRAW (*)   ? APPearance CLEAR (*)   ? Ketones, ur 5 (*)   ? All other components within normal limits  ?PROTIME-INR - Abnormal; Notable for the following components:  ? Prothrombin Time 16.2 (*)   ? INR 1.3 (*)   ? All other components within normal limits  ?RESP PANEL BY RT-PCR (FLU A&B, COVID) ARPGX2  ?CBC  ?APTT  ?TSH  ?BASIC METABOLIC PANEL  ?CBC  ? ? ? ?EKG: ? EKG  Interpretation ? ?Date/Time:  Friday October 09 2021 00:12:59 EDT ?Ventricular Rate:  74 ?PR Interval:    ?QRS Duration: 96 ?QT Interval:  380 ?QTC Calculation: 422 ?R Axis:   -70 ?Text Interpretation: Atrial fibrillation Left ventricular hypertrophy Anterior infarct, old Confirmed by Takelia Urieta, Baxter Hire [phone]) on 10/09/2021 12:36:52 AM ?  ? ?  ? ? ? ?RADIOLOGY: ?My personal review and interpretation of imaging: CT head shows no acute abnormality.  No infarct or bleeding. ? ?I have personally reviewed all radiology reports.   ?CT HEAD WO CONTRAST ( ) ? ?Result Date: 10/08/2021 ?CLINICAL DATA:  Elevated blood pressure with unsteady gait. EXAM: CT HEAD WITHOUT CONTRAST TECHNIQUE: Contiguous axial images were obtained from the base of the skull through the vertex without intravenous contrast. RADIATION DOSE REDUCTION: This exam was performed according to the departmental dose-optimization program which includes automated exposure control, adjustment of the mA and/or kV according to patient size and/or use of iterative reconstruction technique. COMPARISON:  September 28, 2021 FINDINGS: Brain: There is mild cerebral atrophy with widening of the extra-axial spaces and ventricular dilatation. There are areas of decreased attenuation within the white matter tracts of the supratentorial brain, consistent with microvascular disease changes. Vascular: No hyperdense vessel or unexpected calcification. Skull: Normal. Negative for fracture or focal lesion. Sinuses/Orbits: No acute finding. Other: None. IMPRESSION: Generalized cerebral atrophy with chronic white matter small vessel ischemic changes. Electronically Signed   By: Virgina Norfolk M.D.   On: 10/08/2021 23:56   ? ? ?PROCEDURES: ? ?Critical Care performed: Yes, see critical care procedure note(s) ? ? ?CRITICAL CARE ?Performed by: Cyril Mourning Breely Panik ? ? ?Total critical care time: 45 minutes ? ?Critical care time was exclusive of separately billable procedures and treating other  patients. ? ?Critical care was necessary to treat or prevent imminent or life-threatening deterioration. ? ?Critical care was time spent personally by me on the following activities: development of treatment plan with patient and/or surrogate as well a

## 2021-10-08 NOTE — ED Triage Notes (Signed)
Via [facility] for hypertension. Contacted PCP yesterday and advised pt to increase BP meds, but pt states she feels like it is not improving. C/O unsteady gait and feeling " like head is in a fog." No neuro deficits noted. BP with EMS 202/88. Denies CP or SOB. Pt takes Lisinopril and Metoprolol, but only increased dose of Lisinopril.  ?

## 2021-10-09 ENCOUNTER — Encounter: Payer: Self-pay | Admitting: Internal Medicine

## 2021-10-09 DIAGNOSIS — E871 Hypo-osmolality and hyponatremia: Secondary | ICD-10-CM

## 2021-10-09 DIAGNOSIS — Z20822 Contact with and (suspected) exposure to covid-19: Secondary | ICD-10-CM | POA: Diagnosis present

## 2021-10-09 DIAGNOSIS — Z8249 Family history of ischemic heart disease and other diseases of the circulatory system: Secondary | ICD-10-CM | POA: Diagnosis not present

## 2021-10-09 DIAGNOSIS — E039 Hypothyroidism, unspecified: Secondary | ICD-10-CM | POA: Diagnosis present

## 2021-10-09 DIAGNOSIS — I10 Essential (primary) hypertension: Secondary | ICD-10-CM

## 2021-10-09 DIAGNOSIS — I272 Pulmonary hypertension, unspecified: Secondary | ICD-10-CM | POA: Diagnosis present

## 2021-10-09 DIAGNOSIS — J479 Bronchiectasis, uncomplicated: Secondary | ICD-10-CM

## 2021-10-09 DIAGNOSIS — E222 Syndrome of inappropriate secretion of antidiuretic hormone: Secondary | ICD-10-CM | POA: Diagnosis present

## 2021-10-09 DIAGNOSIS — Z79890 Hormone replacement therapy (postmenopausal): Secondary | ICD-10-CM | POA: Diagnosis not present

## 2021-10-09 DIAGNOSIS — I480 Paroxysmal atrial fibrillation: Secondary | ICD-10-CM | POA: Diagnosis present

## 2021-10-09 DIAGNOSIS — Z885 Allergy status to narcotic agent status: Secondary | ICD-10-CM | POA: Diagnosis not present

## 2021-10-09 DIAGNOSIS — E860 Dehydration: Secondary | ICD-10-CM | POA: Diagnosis present

## 2021-10-09 DIAGNOSIS — Z803 Family history of malignant neoplasm of breast: Secondary | ICD-10-CM | POA: Diagnosis not present

## 2021-10-09 DIAGNOSIS — Z7901 Long term (current) use of anticoagulants: Secondary | ICD-10-CM | POA: Diagnosis not present

## 2021-10-09 DIAGNOSIS — Z881 Allergy status to other antibiotic agents status: Secondary | ICD-10-CM | POA: Diagnosis not present

## 2021-10-09 DIAGNOSIS — Z79899 Other long term (current) drug therapy: Secondary | ICD-10-CM | POA: Diagnosis not present

## 2021-10-09 DIAGNOSIS — R519 Headache, unspecified: Secondary | ICD-10-CM | POA: Diagnosis present

## 2021-10-09 DIAGNOSIS — Z87891 Personal history of nicotine dependence: Secondary | ICD-10-CM | POA: Diagnosis not present

## 2021-10-09 LAB — BASIC METABOLIC PANEL
Anion gap: 8 (ref 5–15)
BUN: 11 mg/dL (ref 8–23)
CO2: 29 mmol/L (ref 22–32)
Calcium: 8.5 mg/dL — ABNORMAL LOW (ref 8.9–10.3)
Chloride: 87 mmol/L — ABNORMAL LOW (ref 98–111)
Creatinine, Ser: 0.76 mg/dL (ref 0.44–1.00)
GFR, Estimated: >60 mL/min (ref >60)
Glucose, Bld: 142 mg/dL — ABNORMAL HIGH (ref 70–99)
Potassium: 4.7 mmol/L (ref 3.5–5.1)
Sodium: 124 mmol/L — ABNORMAL LOW (ref 135–145)

## 2021-10-09 LAB — CBC
HCT: 35.9 % — ABNORMAL LOW (ref 36.0–46.0)
Hemoglobin: 11.9 g/dL — ABNORMAL LOW (ref 12.0–15.0)
MCH: 30 pg (ref 26.0–34.0)
MCHC: 33.1 g/dL (ref 30.0–36.0)
MCV: 90.4 fL (ref 80.0–100.0)
Platelets: 228 10*3/uL (ref 150–400)
RBC: 3.97 MIL/uL (ref 3.87–5.11)
RDW: 12.8 % (ref 11.5–15.5)
WBC: 6.2 10*3/uL (ref 4.0–10.5)
nRBC: 0 % (ref 0.0–0.2)

## 2021-10-09 LAB — PROTIME-INR
INR: 1.3 — ABNORMAL HIGH (ref 0.8–1.2)
Prothrombin Time: 16.2 seconds — ABNORMAL HIGH (ref 11.4–15.2)

## 2021-10-09 LAB — APTT: aPTT: 32 seconds (ref 24–36)

## 2021-10-09 LAB — URINALYSIS, ROUTINE W REFLEX MICROSCOPIC
Bilirubin Urine: NEGATIVE
Glucose, UA: NEGATIVE mg/dL
Hgb urine dipstick: NEGATIVE
Ketones, ur: 5 mg/dL — AB
Leukocytes,Ua: NEGATIVE
Nitrite: NEGATIVE
Protein, ur: NEGATIVE mg/dL
Specific Gravity, Urine: 1.006 (ref 1.005–1.030)
pH: 7 (ref 5.0–8.0)

## 2021-10-09 LAB — RESP PANEL BY RT-PCR (FLU A&B, COVID) ARPGX2
Influenza A by PCR: NEGATIVE
Influenza B by PCR: NEGATIVE
SARS Coronavirus 2 by RT PCR: NEGATIVE

## 2021-10-09 LAB — OSMOLALITY, URINE: Osmolality, Ur: 258 mOsm/kg — ABNORMAL LOW (ref 300–900)

## 2021-10-09 LAB — TSH: TSH: 1.87 u[IU]/mL (ref 0.350–4.500)

## 2021-10-09 LAB — SODIUM, URINE, RANDOM: Sodium, Ur: 61 mmol/L

## 2021-10-09 LAB — CBG MONITORING, ED: Glucose-Capillary: 137 mg/dL — ABNORMAL HIGH (ref 70–99)

## 2021-10-09 MED ORDER — ONDANSETRON HCL 4 MG/2ML IJ SOLN
4.0000 mg | Freq: Four times a day (QID) | INTRAMUSCULAR | Status: DC | PRN
Start: 2021-10-09 — End: 2021-10-10

## 2021-10-09 MED ORDER — ACETAMINOPHEN 650 MG RE SUPP: 650.0000 mg | Freq: Four times a day (QID) | RECTAL | Status: DC | PRN

## 2021-10-09 MED ORDER — LISINOPRIL 5 MG PO TABS
2.5000 mg | ORAL_TABLET | Freq: Every day | ORAL | Status: DC
  Administered 2021-10-09: 2.5 mg via ORAL
  Filled 2021-10-09: qty 1

## 2021-10-09 MED ORDER — ONDANSETRON HCL 4 MG PO TABS
4.0000 mg | ORAL_TABLET | Freq: Four times a day (QID) | ORAL | Status: DC | PRN
Start: 2021-10-09 — End: 2021-10-10

## 2021-10-09 MED ORDER — ACETAMINOPHEN 325 MG PO TABS
650.0000 mg | ORAL_TABLET | Freq: Four times a day (QID) | ORAL | Status: DC | PRN
  Administered 2021-10-09: 650 mg via ORAL
  Filled 2021-10-09 (×2): qty 2

## 2021-10-09 MED ORDER — METOPROLOL SUCCINATE ER 50 MG PO TB24
50.0000 mg | ORAL_TABLET | Freq: Every day | ORAL | Status: DC
  Administered 2021-10-09 – 2021-10-10 (×2): 50 mg via ORAL
  Filled 2021-10-09 (×2): qty 1

## 2021-10-09 MED ORDER — LORAZEPAM 0.5 MG PO TABS: 0.5000 mg | ORAL_TABLET | Freq: Every evening | ORAL | Status: DC | PRN

## 2021-10-09 MED ORDER — RIVAROXABAN 20 MG PO TABS
20.0000 mg | ORAL_TABLET | Freq: Every day | ORAL | Status: DC
  Administered 2021-10-09 – 2021-10-10 (×2): 20 mg via ORAL
  Filled 2021-10-09 (×2): qty 1

## 2021-10-09 MED ORDER — BUTALBITAL-APAP-CAFFEINE 50-325-40 MG PO TABS
1.0000 | ORAL_TABLET | Freq: Four times a day (QID) | ORAL | Status: DC | PRN
  Administered 2021-10-09: 1 via ORAL
  Filled 2021-10-09: qty 1

## 2021-10-09 MED ORDER — DOCUSATE SODIUM 100 MG PO CAPS
100.0000 mg | ORAL_CAPSULE | Freq: Two times a day (BID) | ORAL | Status: DC
  Filled 2021-10-09 (×3): qty 1

## 2021-10-09 MED ORDER — MAGNESIUM SULFATE 2 GM/50ML IV SOLN
2.0000 g | Freq: Once | INTRAVENOUS | Status: AC
  Administered 2021-10-09: 2 g via INTRAVENOUS
  Filled 2021-10-09: qty 50

## 2021-10-09 MED ORDER — SODIUM CHLORIDE 0.9 % IV SOLN: INTRAVENOUS | Status: DC

## 2021-10-09 MED ORDER — SODIUM CHLORIDE 0.9% FLUSH
3.0000 mL | Freq: Two times a day (BID) | INTRAVENOUS | Status: DC
  Administered 2021-10-09 – 2021-10-10 (×4): 3 mL via INTRAVENOUS

## 2021-10-09 MED ORDER — ACETAMINOPHEN 500 MG PO TABS
1000.0000 mg | ORAL_TABLET | Freq: Once | ORAL | Status: AC
  Administered 2021-10-09: 1000 mg via ORAL
  Filled 2021-10-09: qty 2

## 2021-10-09 MED ORDER — LISINOPRIL 10 MG PO TABS
10.0000 mg | ORAL_TABLET | Freq: Every day | ORAL | Status: DC
  Administered 2021-10-10: 10 mg via ORAL
  Filled 2021-10-09: qty 1

## 2021-10-09 MED ORDER — LEVOTHYROXINE SODIUM 50 MCG PO TABS
75.0000 ug | ORAL_TABLET | Freq: Every day | ORAL | Status: DC
  Administered 2021-10-09 – 2021-10-10 (×2): 75 ug via ORAL
  Filled 2021-10-09: qty 2
  Filled 2021-10-09: qty 1

## 2021-10-09 NOTE — Progress Notes (Signed)
Admission profile updated. ?

## 2021-10-09 NOTE — Assessment & Plan Note (Addendum)
Sodium 122 on presentation up to 129 on discharge.  Urine random sodium high which goes along with SIADH.  Continue fluid restriction and salt tablets.  Follow-up with PCP as outpatient for repeat BMP. ?

## 2021-10-09 NOTE — Assessment & Plan Note (Addendum)
Likely secondary to high blood pressure.  Headache improved.  I did give IV magnesium and as needed Fioricet here in the hospital. ?

## 2021-10-09 NOTE — Assessment & Plan Note (Addendum)
Last chest x-ray on file read as negative.  Reviewed last CT scan of the chest back in 2021 that showed bronchiectasis.  This could be the cause of the patient's hyponatremia. ? ? ?

## 2021-10-09 NOTE — Assessment & Plan Note (Signed)
Continue levothyroxine 

## 2021-10-09 NOTE — Progress Notes (Signed)
?  Progress Note ? ? ?Patient: Kaylee Santiago BPZ:[id number] DOB: [birth year] DOA: 10/08/2021     0 ?DOS: the patient was seen and examined on 10/09/2021 ?  ? ? ?Assessment and Plan: ?* Hyponatremia ?Sodium 122 on presentation up to 124 today.  Urine random sodium high which goes along with SIADH.  Discontinue IV fluids and fluid restrict. ? ? ?Headache ?Likely secondary to high blood pressure.  Headache improved after Fioricet given.  IV magnesium ordered. ? ?Essential hypertension ?Restart metoprolol.  Will increase lisinopril dose for tomorrow.  Last blood pressure 167/68. ? ? ?AF (paroxysmal atrial fibrillation) (HCC) ?Chronic in nature.  Continue Toprol and Xarelto for anticoagulation. ? ?Hypothyroidism ?Continue levothyroxine.  ? ? ?Bronchiectasis without complication (HCC) ?Last chest x-ray on file read as negative.  Reviewed last CT scan of the chest back in 2021 that showed bronchiectasis.  This could be the cause of the patient's hyponatremia. ? ? ? ? ? ? ?  ? ?Subjective: Patient coming in because she felt like she was going to pass out.  Her blood pressure has been pretty variable at home with blood pressures up above 200 systolic.  They recently increased her lisinopril to 10 mg and doubled up on the Toprol.  Patient had frontal headache today but better after Fioricet dose given.  The patient does drink some coffee at home and I advised her to drink a cup of coffee.  Sodium found to be low on this hospital admission.  Has been low in the past. ? ?Physical Exam: ?Vitals:  ? 10/09/21 0954 10/09/21 1000 10/09/21 1030 10/09/21 1100  ?BP:  (!) 168/96 (!) 152/71 (!) 167/68  ?Pulse: 77 (!) 53 67 63  ?Resp: (!) 21 16 17 17   ?Temp:      ?TempSrc:      ?SpO2: 99% 96% 94% 98%  ?Weight:      ?Height:      ? ?Physical Exam ?HENT:  ?   Head: Normocephalic.  ?   Mouth/Throat:  ?   Pharynx: No oropharyngeal exudate.  ?Eyes:  ?   General: Lids are normal.  ?   Conjunctiva/sclera: Conjunctivae normal.  ?Cardiovascular:  ?    Rate and Rhythm: Normal rate. Rhythm irregularly irregular.  ?   Heart sounds: Normal heart sounds, S1 normal and S2 normal.  ?Pulmonary:  ?   Breath sounds: No decreased breath sounds, wheezing, rhonchi or rales.  ?Abdominal:  ?   Palpations: Abdomen is soft.  ?   Tenderness: There is no abdominal tenderness.  ?Musculoskeletal:  ?   Right lower leg: No swelling.  ?   Left lower leg: No swelling.  ?Skin: ?   General: Skin is warm.  ?   Findings: No rash.  ?Neurological:  ?   Mental Status: She is alert and oriented to person, place, and time.  ?  ?Data Reviewed: ?Reviewed last chest x-ray last CT scan of the chest. ?Sodium 122 on presentation 124 today.  Hemoglobin 11.9 ? ?Family Communication: Spoke with patient's niece at the bedside ? ?Disposition: ?Status is: Observation ?With sodium being 124 today I will watch today and recheck sodium tomorrow with fluid restriction today. ? ?Planned Discharge Destination: Home ? ?Author: ? , MD ?10/09/2021 12:29 PM ? ?For on call review [email or web address].  ?

## 2021-10-09 NOTE — Assessment & Plan Note (Addendum)
Chronic in nature.  Continue Toprol and Xarelto for anticoagulation. ?

## 2021-10-09 NOTE — Assessment & Plan Note (Addendum)
Continue Toprol and lisinopril 10 mg.  Last blood pressure 140/77. ?

## 2021-10-09 NOTE — H&P (Signed)
?History and Physical  ? ? ?Patient: Kaylee Santiago RDE:[id number] DOB: [birth year] ?DOA: 10/08/2021 ?DOS: the patient was seen and examined on 10/09/2021 ?PCP: Lynnea Ferrier, MD  ?Patient coming from: Home ? ?Chief Complaint: No chief complaint on file. ? ?HPI: Kaylee Santiago is a 87 y.o. female with medical history significant of atrial fibrillation.  ?Pt is very anxious and first got upset when I was asking her detail of how she was feeling and her medical history. I explained to her that I have to ask her questions for me to figure out what is happening she was then cooperative. She cant tell me much about her symptoms and note is per chart review. Pt was admitted few weeks ago for similar symptoms.  Patient then started to calm down and talk to me more about her history.  Family member at bedside is listening at the and asks questions about her plan and if he can leave.  Patient denies chest pain but does have random shortness of breath occasionally only with some cough but nothing significant according to her history no productive sputum.  Chart review shows that patient has seen pulmonology in 2000 and 20 August for her bronchiectasis.  Patient's case is unusual she is 87 years old not overweight does not have any obvious risk factors for atrial fibrillation but dilated right atrium on echocardiogram.  I suspect that her bronchiectasis is probably causing a certain degree of pulmonary hypertension.  Discussed with patient to follow-up with her pulmonologist on outpatient basis and that she actually may need to be on medications for her pulmonary hypertension and currently may need diuretic therapy.  Explained to her that the sodium is low because she is dehydrated with a GI bug that she had over the weekend and we can certainly help her with that , also discussed with the patient that the blood pressures that she has written down are using arm digital machine and we will do a manual check.  Patient has no  history of tobacco abuse but has secondhand exposure as her father used to smoke. ?  ?  ?Review of Systems: Review of Systems  ?Neurological:  Positive for weakness.  ? ?Past Medical History:  ?Diagnosis Date  ? Atrial fibrillation (HCC)   ? Atrial fibrillation (HCC)   ? Back pain   ? Dysrhythmia   ? Hypertension   ? Hypothyroidism   ? ?Past Surgical History:  ?Procedure Laterality Date  ? ABDOMINAL HYSTERECTOMY    ? BREAST EXCISIONAL BIOPSY Right 70's  ? CATARACT EXTRACTION W/ INTRAOCULAR LENS  IMPLANT, BILATERAL    ? KYPHOPLASTY N/A 12/21/2018  ? Procedure: KYPHOPLASTY L1;  Surgeon: Kennedy Bucker, MD;  Location: [facility] ORS;  Service: Orthopedics;  Laterality: N/A;  ? ?Social History:  reports that she has quit smoking. Her smoking use included cigarettes. She has never used smokeless tobacco. She reports that she does not currently use alcohol. She reports that she does not use drugs. ? ?Allergies  ?Allergen Reactions  ? Amoxicillin-Pot Clavulanate Diarrhea  ? Levofloxacin Diarrhea  ? Trazodone Other (See Comments)  ?  nightmares  ? ? ?Family History  ?Problem Relation Age of Onset  ? Breast cancer Sister 62  ? Breast cancer Sister   ?     76's  ? CAD Mother   ? ? ?Prior to Admission medications   ?Medication Sig Start Date End Date Taking? Authorizing Provider  ?albuterol (VENTOLIN HFA) 108 (90 Base) MCG/ACT inhaler Inhale 2  puffs into the lungs every 6 (six) hours. 07/04/21   Alford Highland, MD  ?Biotin 5000 MCG TABS Take 5,000 mcg by mouth daily.    [provider]  ?Calcium Carb-Cholecalciferol (CALTRATE 600+D3 PO) Take 1 tablet by mouth daily.    [provider]  ?HYDROcodone-acetaminophen (NORCO/VICODIN) 5-325 MG tablet Take 0.5-1 tablets by mouth 2 (two) times daily as needed. 08/20/21   [provider]  ?levothyroxine (SYNTHROID) 75 MCG tablet Take 1 tablet by mouth daily. 03/03/21   [provider]  ?lisinopril (ZESTRIL) 2.5 MG tablet Take 1 tablet (2.5 mg total) by mouth  daily. 09/23/21   Osvaldo Shipper, MD  ?loratadine (CLARITIN) 10 MG tablet Take 10 mg by mouth daily. 07/09/21   [provider]  ?LORazepam (ATIVAN) 0.5 MG tablet Take 0.5 mg by mouth at bedtime as needed for sleep.    [provider]  ?metoprolol succinate (TOPROL-XL) 50 MG 24 hr tablet Take 50 mg by mouth in the morning and at bedtime. 12/26/17   [provider]  ?Multiple Vitamin (MULTI-VITAMIN DAILY PO) Take 1 tablet by mouth daily.    [provider]  ?Saccharomyces boulardii (PROBIOTIC) 250 MG CAPS Take 1 capsule by mouth daily.    [provider]  ?sulfamethoxazole-trimethoprim (BACTRIM DS) 800-160 MG tablet Take 1 tablet by mouth 2 (two) times daily. For 5 days. 10/06/21 10/11/21  [provider]  ?vitamin E 400 UNIT capsule Take 400 Units by mouth daily.    [provider]  ?XARELTO 20 MG TABS tablet Take 20 mg by mouth daily. 05/12/21   [provider]  ? ? ?Physical Exam: ?Vitals:  ? 10/08/21 2223 10/08/21 2224 10/08/21 2300 10/09/21 0000  ?BP: (!) 189/98  (!) 161/78 (!) 159/74  ?Pulse: 66  84 86  ?Resp: 16  16 16   ?Temp: 97.7 ?F (36.5 ?C)   97.6 ?F (36.4 ?C)  ?TempSrc: Oral   Oral  ?SpO2: 98%  97% 98%  ?Weight:  54.4 kg    ?Height:  5\' 11"  (1.803 m)    ? ?Physical Exam ?Vitals and nursing note reviewed.  ?Constitutional:   ?   General: She is not in acute distress. ?   Appearance: Normal appearance. She is not ill-appearing, toxic-appearing or diaphoretic.  ?HENT:  ?   Head: Normocephalic and atraumatic.  ?   Right Ear: Hearing and external ear normal.  ?   Left Ear: Hearing and external ear normal.  ?   Nose: Nose normal. No nasal deformity.  ?   Mouth/Throat:  ?   Lips: Pink.  ?   Mouth: Mucous membranes are moist.  ?   Tongue: No lesions.  ?   Pharynx: Oropharynx is clear.  ?Eyes:  ?   Extraocular Movements: Extraocular movements intact.  ?   Pupils: Pupils are equal, round, and reactive to light.  ?Neck:  ?   Vascular: No carotid  bruit.  ?Cardiovascular:  ?   Rate and Rhythm: Normal rate and regular rhythm.  ?   Pulses: Normal pulses.  ?   Heart sounds: Normal heart sounds.  ?Pulmonary:  ?   Effort: Pulmonary effort is normal.  ?   Breath sounds: Rhonchi present.  ?   Comments: Diffuse BL rhonchi and crackles.  ? ?Abdominal:  ?   General: Bowel sounds are normal. There is no distension.  ?   Palpations: Abdomen is soft. There is no mass.  ?   Tenderness: There is no abdominal tenderness. There  is no guarding.  ?   Hernia: No hernia is present.  ?Musculoskeletal:  ?   Right lower leg: No edema.  ?   Left lower leg: No edema.  ?Skin: ?   General: Skin is warm.  ?Neurological:  ?   General: No focal deficit present.  ?   Mental Status: She is alert and oriented to person, place, and time.  ?   Cranial Nerves: Cranial nerves 2-12 are intact.  ?   Motor: Motor function is intact.  ?Psychiatric:     ?   Attention and Perception: Attention normal.     ?   Mood and Affect: Mood normal.     ?   Speech: Speech normal.     ?   Behavior: Behavior normal. Behavior is cooperative.     ?   Cognition and Memory: Cognition normal.  ? ?Data Reviewed: ?Results for orders placed or performed during the hospital encounter of 10/08/21 (from the past 24 hour(s))  ?TSH     Status: None  ? Collection Time: 10/08/21 10:27 PM  ?Result Value Ref Range  ? TSH 1.870 0.350 - 4.500 uIU/mL  ?CBC     Status: None  ? Collection Time: 10/08/21 10:28 PM  ?Result Value Ref Range  ? WBC 6.0 4.0 - 10.5 K/uL  ? RBC 4.63 3.87 - 5.11 MIL/uL  ? Hemoglobin 13.9 12.0 - 15.0 g/dL  ? HCT 41.5 36.0 - 46.0 %  ? MCV 89.6 80.0 - 100.0 fL  ? MCH 30.0 26.0 - 34.0 pg  ? MCHC 33.5 30.0 - 36.0 g/dL  ? RDW 12.9 11.5 - 15.5 %  ? Platelets 263 150 - 400 K/uL  ? nRBC 0.0 0.0 - 0.2 %  ?Comprehensive metabolic panel     Status: Abnormal  ? Collection Time: 10/08/21 10:28 PM  ?Result Value Ref Range  ? Sodium 122 (L) 135 - 145 mmol/L  ? Potassium 4.6 3.5 - 5.1 mmol/L  ? Chloride 85 (L) 98 - 111 mmol/L  ?  CO2 30 22 - 32 mmol/L  ? Glucose, Bld 116 (H) 70 - 99 mg/dL  ? BUN 14 8 - 23 mg/dL  ? Creatinine, Ser 0.65 0.44 - 1.00 mg/dL  ? Calcium 9.1 8.9 - 10.3 mg/dL  ? Total Protein 8.1 6.5 - 8.1 g/dL  ? Albumin 4.7 3.

## 2021-10-10 DIAGNOSIS — I10 Essential (primary) hypertension: Secondary | ICD-10-CM | POA: Diagnosis not present

## 2021-10-10 DIAGNOSIS — I480 Paroxysmal atrial fibrillation: Secondary | ICD-10-CM | POA: Diagnosis not present

## 2021-10-10 DIAGNOSIS — R519 Headache, unspecified: Secondary | ICD-10-CM | POA: Diagnosis not present

## 2021-10-10 DIAGNOSIS — E871 Hypo-osmolality and hyponatremia: Secondary | ICD-10-CM | POA: Diagnosis not present

## 2021-10-10 LAB — SODIUM: Sodium: 129 mmol/L — ABNORMAL LOW (ref 135–145)

## 2021-10-10 LAB — BASIC METABOLIC PANEL
Anion gap: 7 (ref 5–15)
BUN: 12 mg/dL (ref 8–23)
CO2: 26 mmol/L (ref 22–32)
Calcium: 8.7 mg/dL — ABNORMAL LOW (ref 8.9–10.3)
Chloride: 93 mmol/L — ABNORMAL LOW (ref 98–111)
Creatinine, Ser: 0.68 mg/dL (ref 0.44–1.00)
GFR, Estimated: >60 mL/min (ref >60)
Glucose, Bld: 84 mg/dL (ref 70–99)
Potassium: 4.7 mmol/L (ref 3.5–5.1)
Sodium: 126 mmol/L — ABNORMAL LOW (ref 135–145)

## 2021-10-10 LAB — CBC
HCT: 40.8 % (ref 36.0–46.0)
Hemoglobin: 13.6 g/dL (ref 12.0–15.0)
MCH: 30.2 pg (ref 26.0–34.0)
MCHC: 33.3 g/dL (ref 30.0–36.0)
MCV: 90.5 fL (ref 80.0–100.0)
Platelets: 214 10*3/uL (ref 150–400)
RBC: 4.51 MIL/uL (ref 3.87–5.11)
RDW: 13.2 % (ref 11.5–15.5)
WBC: 6.9 10*3/uL (ref 4.0–10.5)
nRBC: 0 % (ref 0.0–0.2)

## 2021-10-10 MED ORDER — SODIUM CHLORIDE 1 G PO TABS
1.0000 g | ORAL_TABLET | Freq: Two times a day (BID) | ORAL | Status: DC
  Administered 2021-10-10 (×2): 1 g via ORAL
  Filled 2021-10-10 (×2): qty 1

## 2021-10-10 MED ORDER — SODIUM CHLORIDE 1 G PO TABS: 1.0000 g | ORAL_TABLET | Freq: Two times a day (BID) | ORAL | 0 refills | Status: AC

## 2021-10-10 MED ORDER — LISINOPRIL 10 MG PO TABS: 10.0000 mg | ORAL_TABLET | Freq: Every day | ORAL | 0 refills | Status: DC

## 2021-10-10 NOTE — Discharge Instructions (Signed)
Fluid restriction of daily ?Salt tablets prescribed ? ?Recommend follow up BMP as outpatient ?

## 2021-10-10 NOTE — Discharge Summary (Signed)
?Physician Discharge Summary ?  ?Patient: Kaylee Santiago MRN: [id number] DOB: 02/03/1935  ?Admit date:     10/08/2021  ?Discharge date: 10/10/21  ?Discharge Physician: Alford Highland  ? ?PCP: Lynnea Ferrier, MD  ? ?Recommendations at discharge:  ? ?Follow-up PCP 5 days ? ?Discharge Diagnoses: ?Principal Problem: ?  Hyponatremia ?Active Problems: ?  Headache ?  AF (paroxysmal atrial fibrillation) (HCC) ?  Essential hypertension ?  Hypothyroidism ?  Bronchiectasis without complication (HCC) ? ? ?Hospital Course: ?Patient admitted to the hospital on 10/08/2021 and discharged on 10/10/2021.  Patient came in with elevated blood pressure and felt like she was going to pass out.  She was found to have a low sodium of 122.  She has had this issue in the past also with low sodium.  We did fluid restriction and they started salt tablets.  Sodium was 122 on presentation and upon discharge 129.  Urine sodium high at 61 urine osmolarity low.  Likely SIADH.  Recommend following up BMP as outpatient.  Patient also had a headache and elevated blood pressure on presentation.  Patient was placed back on her lisinopril and Toprol and blood pressure remained stable. ? ?Assessment and Plan: ?* Hyponatremia ?Sodium 122 on presentation up to 129 on discharge.  Urine random sodium high which goes along with SIADH.  Continue fluid restriction and salt tablets.  Follow-up with PCP as outpatient for repeat BMP. ? ?Headache ?Likely secondary to high blood pressure.  Headache improved.  I did give IV magnesium and as needed Fioricet here in the hospital. ? ?Essential hypertension ?Continue Toprol and lisinopril 10 mg.  Last blood pressure 140/77. ? ?AF (paroxysmal atrial fibrillation) (HCC) ?Chronic in nature.  Continue Toprol and Xarelto for anticoagulation. ? ?Hypothyroidism ?Continue levothyroxine.  ? ? ?Bronchiectasis without complication (HCC) ?Last chest x-ray on file read as negative.  Reviewed last CT scan of the chest back in 2021 that  showed bronchiectasis.  This could be the cause of the patient's hyponatremia. ? ? ? ? ? ? ?  ? ? ?Consultants: None ?Procedures performed: None ?Disposition: Home ?Diet recommendation:  ?Regular diet ?DISCHARGE MEDICATION: ?Allergies as of 10/10/2021   ? ?   Reactions  ? Amoxicillin-pot Clavulanate Diarrhea  ? Levofloxacin Diarrhea  ? Trazodone Other (See Comments)  ? nightmares  ? ?  ? ?  ?Medication List  ?  ? ?STOP taking these medications   ? ?albuterol 108 (90 Base) MCG/ACT inhaler ?Commonly known as: VENTOLIN HFA ?  ?HYDROcodone-acetaminophen 5-325 MG tablet ?Commonly known as: NORCO/VICODIN ?  ?sulfamethoxazole-trimethoprim 800-160 MG tablet ?Commonly known as: BACTRIM DS ?  ? ?  ? ?TAKE these medications   ? ?Biotin 5000 MCG Tabs ?Take 5,000 mcg by mouth daily. ?  ?CALTRATE 600+D3 PO ?Take 1 tablet by mouth daily. ?  ?levothyroxine 75 MCG tablet ?Commonly known as: SYNTHROID ?Take 1 tablet by mouth daily. ?  ?lisinopril 10 MG tablet ?Commonly known as: ZESTRIL ?Take 1 tablet (10 mg total) by mouth daily. ?Start taking on: October 11, 2021 ?What changed:  ?medication strength ?how much to take ?  ?loratadine 10 MG tablet ?Commonly known as: CLARITIN ?Take 10 mg by mouth daily. ?  ?LORazepam 0.5 MG tablet ?Commonly known as: ATIVAN ?Take 0.5 mg by mouth at bedtime as needed for sleep. ?  ?metoprolol succinate 50 MG 24 hr tablet ?Commonly known as: TOPROL-XL ?Take 50 mg by mouth in the morning and at bedtime. ?  ?MULTI-VITAMIN DAILY PO ?Take 1  tablet by mouth daily. ?  ?Probiotic 250 MG Caps ?Take 1 capsule by mouth daily. ?  ?sodium chloride 1 g tablet ?Take 1 tablet (1 g total) by mouth 2 (two) times daily with a meal. ?  ?vitamin E 180 MG (400 UNITS) capsule ?Take 400 Units by mouth daily. ?  ?Xarelto 20 MG Tabs tablet ?Generic drug: rivaroxaban ?Take 20 mg by mouth daily. ?  ? ?  ? ? Follow-up Information   ? ? Tama High III, MD Follow up in 5 day(s).   ?Specialty: Internal Medicine ?Contact  information: ?[place] [address] ?[phone] ? ? ?  ?  ? ?  ?  ? ?  ? ?Discharge Exam: ?Filed Weights  ? 10/08/21 2224  ?Weight: 54.4 kg  ? ?Physical Exam ?HENT:  ?   Head: Normocephalic.  ?   Mouth/Throat:  ?   Pharynx: No oropharyngeal exudate.  ?Eyes:  ?   General: Lids are normal.  ?   Conjunctiva/sclera: Conjunctivae normal.  ?Cardiovascular:  ?   Rate and Rhythm: Normal rate. Rhythm irregularly irregular.  ?   Heart sounds: Normal heart sounds, S1 normal and S2 normal.  ?Pulmonary:  ?   Breath sounds: No decreased breath sounds, wheezing, rhonchi or rales.  ?Abdominal:  ?   Palpations: Abdomen is soft.  ?   Tenderness: There is no abdominal tenderness.  ?Musculoskeletal:  ?   Right lower leg: No swelling.  ?   Left lower leg: No swelling.  ?Skin: ?   General: Skin is warm.  ?   Findings: No rash.  ?Neurological:  ?   Mental Status: She is alert and oriented to person, place, and time.  ?  ? ?Condition at discharge: stable ? ?The results of significant diagnostics from this hospitalization (including imaging, microbiology, ancillary and laboratory) are listed below for reference.  ? ?Imaging Studies: ?DG Chest 2 View ? ?Result Date: 09/21/2021 ?CLINICAL DATA:  high blood pressure EXAM: CHEST - 2 VIEW COMPARISON:  Chest x-ray dated September 19, 2021 FINDINGS: Cardiac and mediastinal contours are unchanged. Bibasilar left greater than right opacities, unchanged compared to prior exam and likely due to bronchiectasis. No new parenchymal opacity. No pleural effusion or pneumothorax. IMPRESSION: No acute cardiopulmonary disease. Electronically Signed   By: Yetta Glassman M.D.   On: 09/21/2021 17:01  ? ?CT HEAD WO CONTRAST (5MM) ? ?Result Date: 10/08/2021 ?CLINICAL DATA:  Elevated blood pressure with unsteady gait. EXAM: CT HEAD WITHOUT CONTRAST TECHNIQUE: Contiguous axial images were obtained from the base of the skull through the vertex without intravenous contrast. RADIATION DOSE REDUCTION:  This exam was performed according to the departmental dose-optimization program which includes automated exposure control, adjustment of the mA and/or kV according to patient size and/or use of iterative reconstruction technique. COMPARISON:  September 28, 2021 FINDINGS: Brain: There is mild cerebral atrophy with widening of the extra-axial spaces and ventricular dilatation. There are areas of decreased attenuation within the white matter tracts of the supratentorial brain, consistent with microvascular disease changes. Vascular: No hyperdense vessel or unexpected calcification. Skull: Normal. Negative for fracture or focal lesion. Sinuses/Orbits: No acute finding. Other: None. IMPRESSION: Generalized cerebral atrophy with chronic white matter small vessel ischemic changes. Electronically Signed   By: Virgina Norfolk M.D.   On: 10/08/2021 23:56  ? ?CT HEAD WO CONTRAST (5MM) ? ?Result Date: 09/28/2021 ?CLINICAL DATA:  Headache, dizziness, hypertension EXAM: CT HEAD WITHOUT CONTRAST TECHNIQUE: Contiguous axial images were obtained from the  base of the skull through the vertex without intravenous contrast. RADIATION DOSE REDUCTION: This exam was performed according to the departmental dose-optimization program which includes automated exposure control, adjustment of the mA and/or kV according to patient size and/or use of iterative reconstruction technique. COMPARISON:  09/21/2021 FINDINGS: Brain: No evidence of acute infarction, hemorrhage, hydrocephalus, extra-axial collection or mass lesion/mass effect. Subcortical white matter and periventricular small vessel ischemic changes. Vascular: Intracranial atherosclerosis. Skull: Normal. Negative for fracture or focal lesion. Sinuses/Orbits: The visualized paranasal sinuses are essentially clear. The mastoid air cells are unopacified. Other: None. IMPRESSION: No evidence of acute intracranial abnormality. Small vessel ischemic changes. Electronically Signed   By: Julian Hy M.D.   On: 09/28/2021 00:50  ? ?CT HEAD WO CONTRAST (5MM) ? ?Result Date: 09/21/2021 ?CLINICAL DATA:  Headache and dizziness all weekend, hypertension, tachycardia, history of atrial fibrillation EXAM: CT HEAD W

## 2021-10-10 NOTE — TOC Initial Note (Signed)
Transition of Care (TOC) - Initial/Assessment Note  ? ? ?Patient Details  ?Name: Kaylee Santiago ?MRN: [id number] ?Date of Birth: 1935/06/12 ? ?Transition of Care (TOC) CM/SW Contact:    ?Adessa Primiano E Aneesha Holloran, LCSW ?Phone Number: ?10/10/2021, 10:45 AM ? ?Clinical Narrative:            CSW completed high readmission risk assessment. ?Patient lives alone and drives herself to appointments. Patient states she has neighbors who are supportive and can help her as needed. PCP is Dr. Caryl Comes. Pharmacy is [facility]. No HH or DME history. Patient did go to [facility] in the past. Patient states she is independent at home and does not anticipate any TOC needs. Patient agreed to reach out if needs arise. Patient stated she will have a ride home when DC.  ? ? ?Expected Discharge Plan: Home/[place] Care ?Barriers to Discharge: Continued Medical Work up ? ? ?Patient Goals and CMS Choice ?Patient states their goals for this hospitalization and ongoing recovery are:: to return home ?CMS Medicare.gov Compare Post Acute Care list provided to:: Patient ?Choice offered to / list presented to : Patient ? ?Expected Discharge Plan and Services ?Expected Discharge Plan: Home/[place] Care ?  ?  ?  ?Living arrangements for the past 2 months: [place] ?                ?  ?  ?  ?  ?  ?  ?  ?  ?  ?  ? ?Prior Living Arrangements/Services ?Living arrangements for the past 2 months: [place] ?Lives with:: [place] ?Patient language and need for interpreter reviewed:: Yes ?Do you feel safe going back to the place where you live?: Yes      ?Need for Family Participation in Patient Care: Yes (Comment) ?Care giver support system in place?: Yes (comment) ?  ?Criminal Activity/Legal Involvement Pertinent to Current Situation/Hospitalization: No - Comment as needed ? ?Activities of Daily Living ?Home Assistive Devices/Equipment: None ?ADL Screening (condition at time of admission) ?Patient's cognitive ability adequate to safely complete daily  activities?: Yes ?Is the patient deaf or have difficulty hearing?: No ?Does the patient have difficulty seeing, even when wearing glasses/contacts?: No ?Does the patient have difficulty concentrating, remembering, or making decisions?: No ?Patient able to express need for assistance with ADLs?: Yes ?Does the patient have difficulty dressing or bathing?: No ?Independently performs ADLs?: Yes (appropriate for developmental age) ?Does the patient have difficulty walking or climbing stairs?: No ?Weakness of Legs: None ?Weakness of Arms/Hands: None ? ?Permission Sought/Granted ?  ?  ?   ?   ?   ?   ? ?Emotional Assessment ?  ?  ?  ?Orientation: : Oriented to [place], Oriented to Place, Oriented to  Time, Oriented to Situation ?Alcohol / Substance Use: Not Applicable ?Psych Involvement: No (comment) ? ?Admission diagnosis:  Hyponatremia [E87.1] ?Left arm numbness [R20.0] ?Generalized headache [R51.9] ?Headache [R51.9] ?Hypertension, unspecified type [I10] ?Patient Active Problem List  ? Diagnosis Date Noted  ? Headache 10/09/2021  ? Protein-calorie malnutrition, severe 09/23/2021  ? Hypertensive urgency 09/21/2021  ? History of COVID-19 09/21/2021  ? Lower extremity edema 09/21/2021  ? Hypophosphatemia   ? COPD with acute exacerbation ([place])   ? Hyponatremia 07/01/2021  ? Bronchopneumonia 07/01/2021  ? Pneumonia due to COVID-19 virus 07/01/2021  ? Chronic respiratory failure with hypoxia ([place]) 07/01/2021  ? History of epistaxis 07/01/2021  ? Acute metabolic encephalopathy [id number]  ? Malnutrition of moderate degree 04/10/2021  ?  Acute blood loss anemia 04/10/2021  ? Abnormal CT of the abdomen 04/10/2021  ? Acute on chronic respiratory failure with hypoxia ([place]) 04/09/2021  ? Epistaxis 04/09/2021  ? AF (paroxysmal atrial fibrillation) ([place])   ? Hypothyroidism   ? Essential hypertension   ? Bronchiectasis without complication ([place]) [id number]  ? ?PCP:  Adin Hector, MD ?Pharmacy:  No Pharmacies Listed ? ? ? ?Social  Determinants of Health (SDOH) Interventions ?  ? ?Readmission Risk Interventions ? ?  10/10/2021  ? 10:44 AM  ?Readmission Risk Prevention Plan  ?Transportation Screening Complete  ?PCP or Specialist Appt within 3-5 Days Complete  ?[place] or Home Care Consult Complete  ?Social Work Consult for [place] Planning/Counseling Complete  ?Palliative Care Screening Not Applicable  ?Medication Review Press photographer) Complete  ? ? ? ?

## 2021-10-15 ENCOUNTER — Emergency Department: Payer: Medicare Other

## 2021-10-15 ENCOUNTER — Emergency Department
Admission: EM | Admit: 2021-10-15 | Discharge: 2021-10-15 | Disposition: A | Discharge status: Home / Self Care | Payer: Medicare Other | Attending: Emergency Medicine | Admitting: Emergency Medicine

## 2021-10-15 ENCOUNTER — Other Ambulatory Visit: Payer: Self-pay

## 2021-10-15 DIAGNOSIS — E871 Hypo-osmolality and hyponatremia: Secondary | ICD-10-CM | POA: Diagnosis not present

## 2021-10-15 DIAGNOSIS — Z79899 Other long term (current) drug therapy: Secondary | ICD-10-CM | POA: Insufficient documentation

## 2021-10-15 DIAGNOSIS — R519 Headache, unspecified: Secondary | ICD-10-CM | POA: Diagnosis present

## 2021-10-15 DIAGNOSIS — I4891 Unspecified atrial fibrillation: Secondary | ICD-10-CM | POA: Diagnosis not present

## 2021-10-15 DIAGNOSIS — R202 Paresthesia of skin: Secondary | ICD-10-CM | POA: Diagnosis not present

## 2021-10-15 DIAGNOSIS — E039 Hypothyroidism, unspecified: Secondary | ICD-10-CM | POA: Diagnosis not present

## 2021-10-15 DIAGNOSIS — R42 Dizziness and giddiness: Secondary | ICD-10-CM

## 2021-10-15 DIAGNOSIS — I10 Essential (primary) hypertension: Secondary | ICD-10-CM | POA: Insufficient documentation

## 2021-10-15 LAB — COMPREHENSIVE METABOLIC PANEL
ALT: 22 U/L (ref 0–44)
AST: 32 U/L (ref 15–41)
Albumin: 3.6 g/dL (ref 3.5–5.0)
Alkaline Phosphatase: 40 U/L (ref 38–126)
Anion gap: 9 (ref 5–15)
BUN: 16 mg/dL (ref 8–23)
CO2: 28 mmol/L (ref 22–32)
Calcium: 9.2 mg/dL (ref 8.9–10.3)
Chloride: 93 mmol/L — ABNORMAL LOW (ref 98–111)
Creatinine, Ser: 0.56 mg/dL (ref 0.44–1.00)
GFR, Estimated: >60 mL/min (ref >60)
Glucose, Bld: 119 mg/dL — ABNORMAL HIGH (ref 70–99)
Potassium: 4.2 mmol/L (ref 3.5–5.1)
Sodium: 130 mmol/L — ABNORMAL LOW (ref 135–145)
Total Bilirubin: 0.5 mg/dL (ref 0.3–1.2)
Total Protein: 6.8 g/dL (ref 6.5–8.1)

## 2021-10-15 LAB — PROTIME-INR
INR: 2.2 — ABNORMAL HIGH (ref 0.8–1.2)
Prothrombin Time: 24.3 seconds — ABNORMAL HIGH (ref 11.4–15.2)

## 2021-10-15 LAB — DIFFERENTIAL
Abs Immature Granulocytes: 0.01 10*3/uL (ref 0.00–0.07)
Basophils Absolute: 0 10*3/uL (ref 0.0–0.1)
Basophils Relative: 0 %
Eosinophils Absolute: 0 10*3/uL (ref 0.0–0.5)
Eosinophils Relative: 1 %
Immature Granulocytes: 0 %
Lymphocytes Relative: 11 %
Lymphs Abs: 0.6 10*3/uL — ABNORMAL LOW (ref 0.7–4.0)
Monocytes Absolute: 0.4 10*3/uL (ref 0.1–1.0)
Monocytes Relative: 7 %
Neutro Abs: 4.4 10*3/uL (ref 1.7–7.7)
Neutrophils Relative %: 81 %

## 2021-10-15 LAB — URINALYSIS, COMPLETE (UACMP) WITH MICROSCOPIC
Bacteria, UA: NONE SEEN
Bilirubin Urine: NEGATIVE
Glucose, UA: NEGATIVE mg/dL
Hgb urine dipstick: NEGATIVE
Ketones, ur: NEGATIVE mg/dL
Leukocytes,Ua: NEGATIVE
Nitrite: NEGATIVE
Protein, ur: NEGATIVE mg/dL
Specific Gravity, Urine: 1.005 (ref 1.005–1.030)
Squamous Epithelial / HPF: NONE SEEN (ref 0–5)
pH: 9 — ABNORMAL HIGH (ref 5.0–8.0)

## 2021-10-15 LAB — CBC
HCT: 36.1 % (ref 36.0–46.0)
Hemoglobin: 12 g/dL (ref 12.0–15.0)
MCH: 30.1 pg (ref 26.0–34.0)
MCHC: 33.2 g/dL (ref 30.0–36.0)
MCV: 90.5 fL (ref 80.0–100.0)
Platelets: 224 10*3/uL (ref 150–400)
RBC: 3.99 MIL/uL (ref 3.87–5.11)
RDW: 13.1 % (ref 11.5–15.5)
WBC: 5.4 10*3/uL (ref 4.0–10.5)
nRBC: 0 % (ref 0.0–0.2)

## 2021-10-15 LAB — TROPONIN I (HIGH SENSITIVITY): Troponin I (High Sensitivity): 6 ng/L (ref <18)

## 2021-10-15 LAB — APTT: aPTT: 35 seconds (ref 24–36)

## 2021-10-15 IMAGING — MR MR HEAD W/O CM
12 series · 48 of 48 positions shown · non-contrast
Comparison: Same-day head CT, brain MRI [DATE]

CLINICAL DATA: Headache, dizziness.

EXAM:
MRI HEAD WITHOUT CONTRAST
TECHNIQUE: Multiplanar, multiecho pulse sequences of the brain and surrounding
structures were obtained without intravenous contrast.

[Series 5: ax dwi_tracew · axial · 3.0mm · 0.65mm/px · z∈[-107,+44]mm · 3 of 48 slices shown]
[im 1/48]
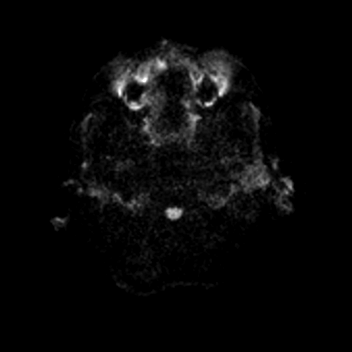
[im 24/48]
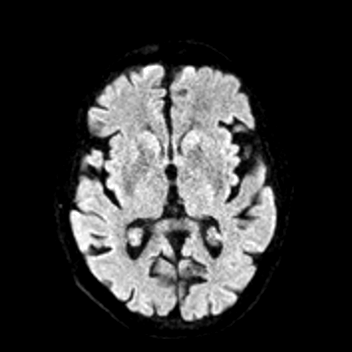
[im 48/48]
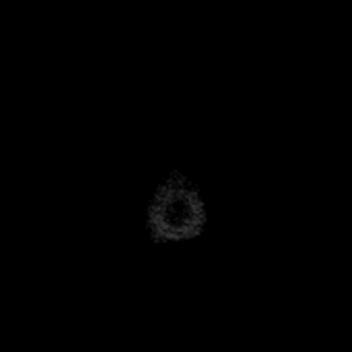

[Series 6: ax dwi_adc · axial · 3.0mm · 0.65mm/px · z∈[-107,+44]mm · 3 of 48 slices shown]
[im 1/48]
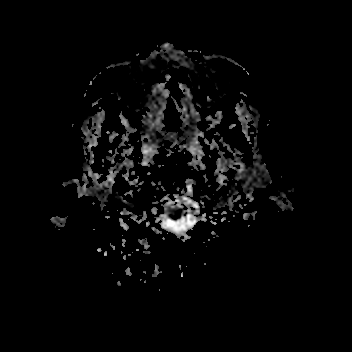
[im 24/48]
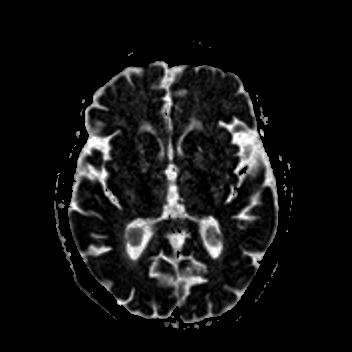
[im 48/48]
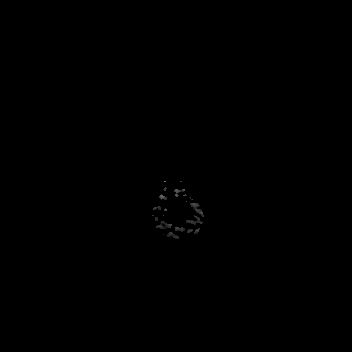

[Series 7: cor dwi_tracew · coronal · 5.0mm · 0.65mm/px · 3 of 36 slices shown]
[im 1/36]
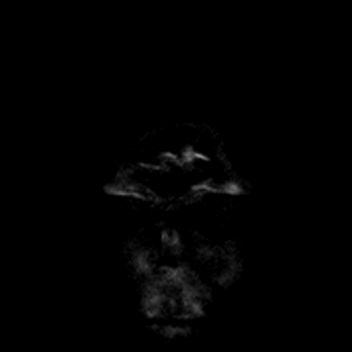
[im 18/36]
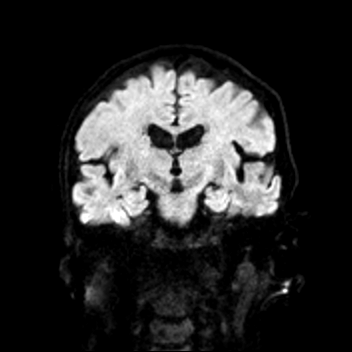
[im 36/36]
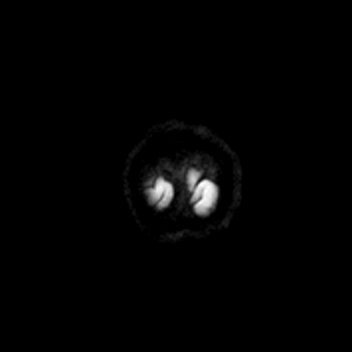

[Series 8: cor dwi_adc · coronal · 5.0mm · 0.65mm/px · 3 of 36 slices shown]
[im 1/36]
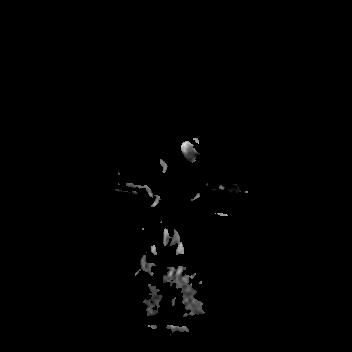
[im 18/36]
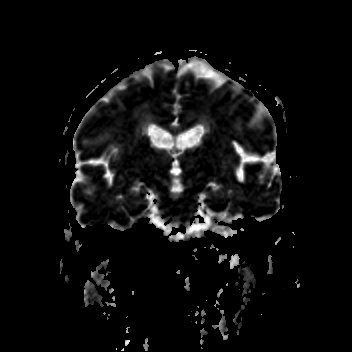
[im 36/36]
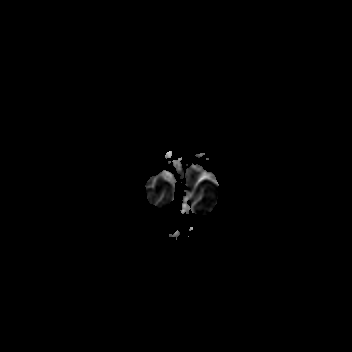

[Series 9: T1 · sagittal · 5.0mm · 0.62mm/px · 2 of 22 slices shown (1 of 2)]
[im 1/22]
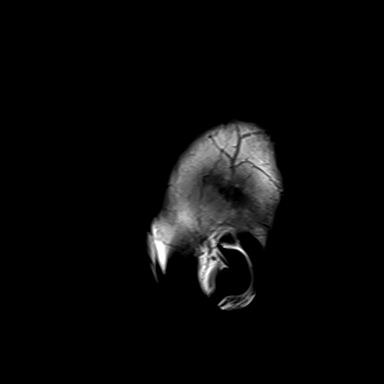
[im 22/22]
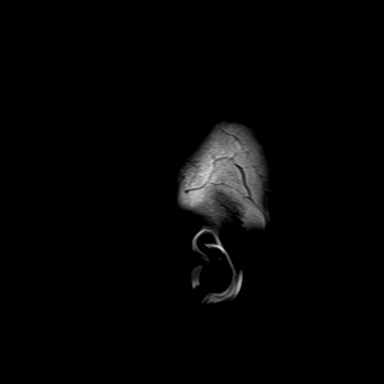

[Series 10: T2 · axial · 5.0mm · 0.53mm/px · z∈[-101,+40]mm · 2 of 25 slices shown (1 of 2)]
[im 1/25]
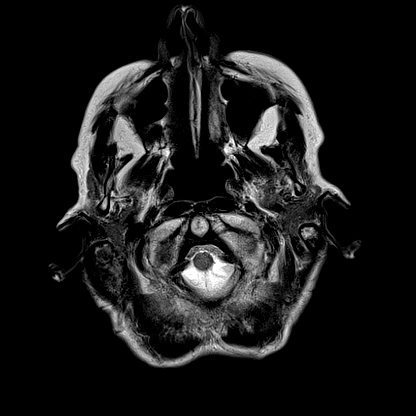
[im 25/25]
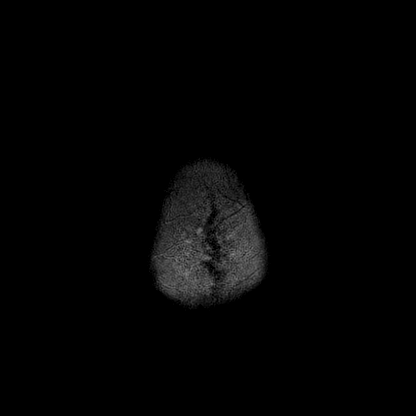

[Series 11: mag_images · axial · 3.0mm · 0.90mm/px · z∈[-118,+55]mm · 5 of 60 slices shown]
[im 1/60]
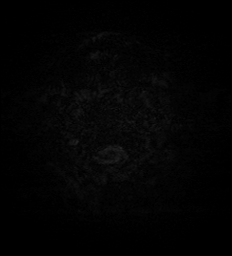
[im 15/60]
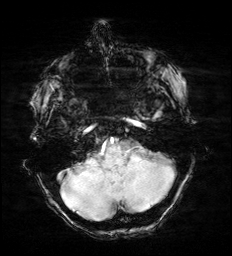
[im 30/60]
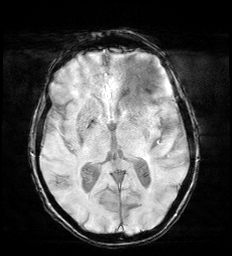
[im 45/60]
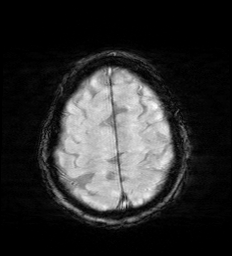
[im 60/60]
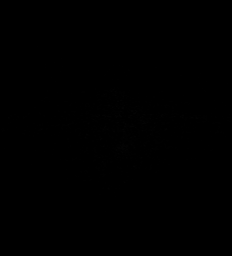

[Series 12: pha_images · axial · 3.0mm · 0.90mm/px · z∈[-118,+43]mm · 4 of 56 slices shown]
[im 1/56]
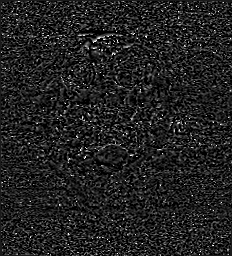
[im 19/56]
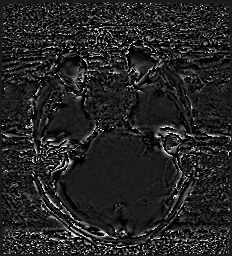
[im 37/56]
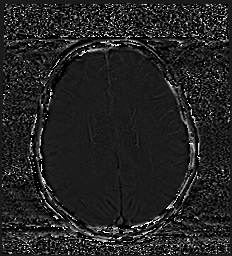
[im 56/56]
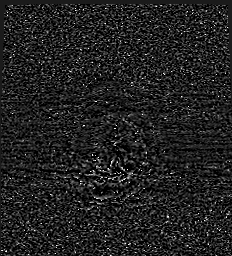

[Series 13: swi_images · axial · 3.0mm · 0.90mm/px · z∈[-118,+55]mm · 5 of 60 slices shown]
[im 1/60]
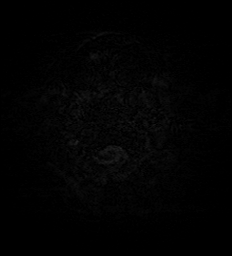
[im 15/60]
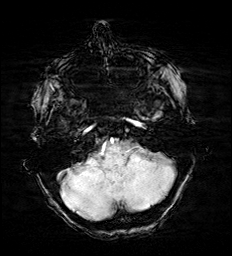
[im 30/60]
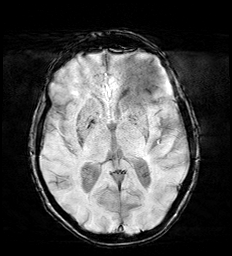
[im 45/60]
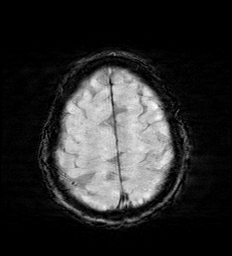
[im 60/60]
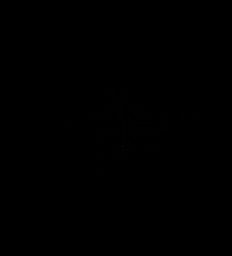

[Series 15: FLAIR · axial · 3.0mm · 0.53mm/px · z∈[-110,+49]mm · 4 of 55 slices shown]
[im 1/55]
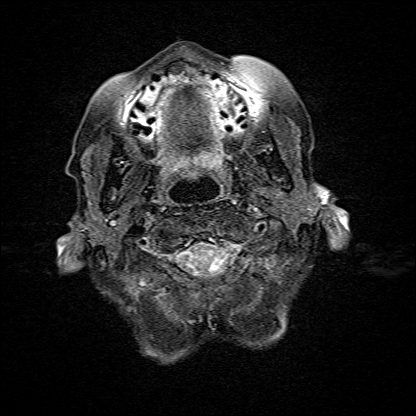
[im 19/55]
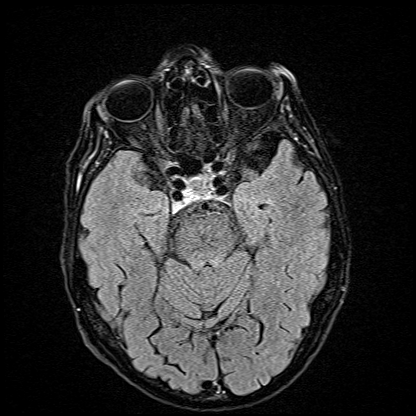
[im 37/55]
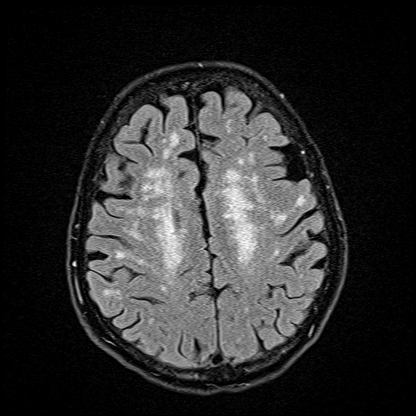
[im 55/55]
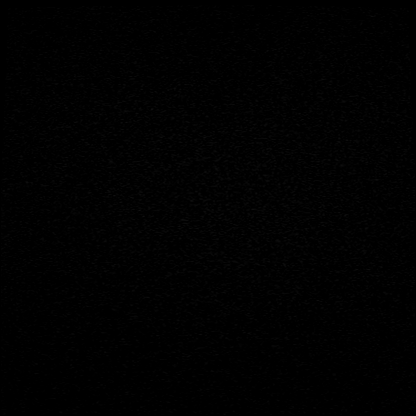

[Series 16: T1 · axial · 1.0mm · 0.98mm/px · z∈[-111,+44]mm · 12 of 160 slices shown (2 of 2)]
[im 1/160]
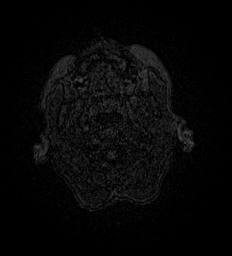
[im 15/160]
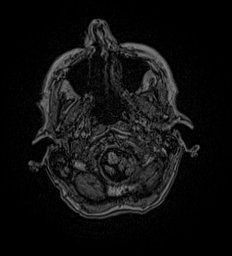
[im 29/160]
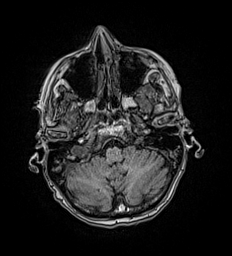
[im 44/160]
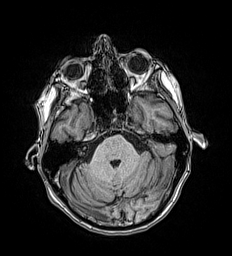
[im 58/160]
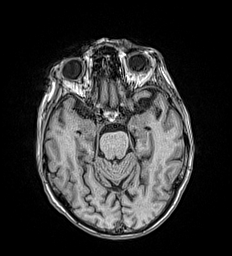
[im 73/160]
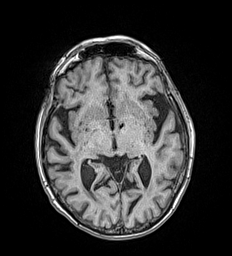
[im 87/160]
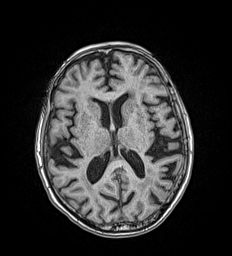
[im 102/160]
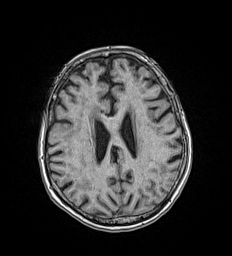
[im 116/160]
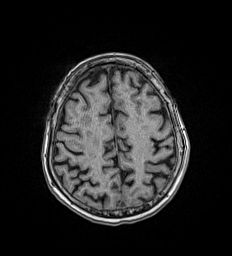
[im 131/160]
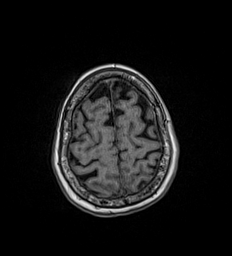
[im 145/160]
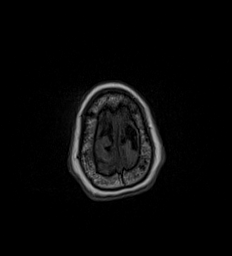
[im 160/160]
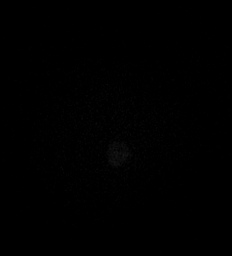

[Series 17: T2 · coronal · 5.0mm · 0.57mm/px · 2 of 28 slices shown (2 of 2)]
[im 1/28]
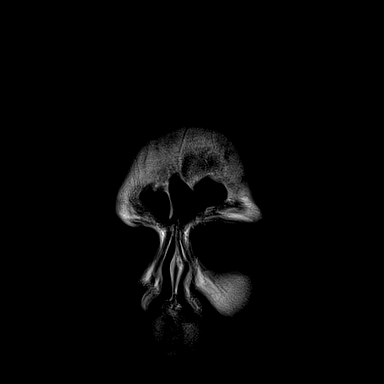
[im 28/28]
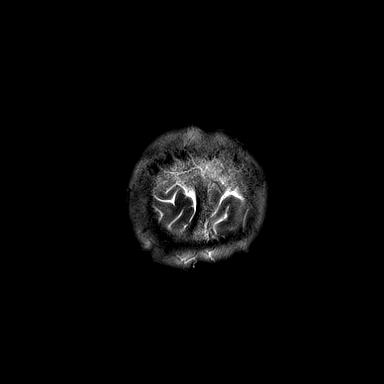

[48 of 48 positions shown; findings below may reference images not displayed]

FINDINGS: Brain: There is no evidence of acute intracranial hemorrhage,
extra-axial fluid collection, or acute infarct.

Parenchymal volume is within normal limits for age. The ventricles
are stable in size. There is patchy and confluent FLAIR signal
abnormality throughout the subcortical and periventricular white
matter again likely reflecting sequela of advanced chronic white
matter microangiopathy.

There is no suspicious parenchymal signal abnormality. A small
simple appearing pituitary cyst is again seen measuring 7 mm,
unchanged. There is no solid mass lesion. There is no mass effect or
midline shift.

Vascular: Normal flow voids.

Skull and upper cervical spine: Normal marrow signal.

Sinuses/Orbits: The paranasal sinuses are clear. Bilateral lens
implants are in place. The globes and orbits are otherwise
unremarkable.

Other: None.
IMPRESSION: No acute intracranial pathology. No significant interval change
since the MRI from [DATE].

## 2021-10-15 IMAGING — CT CT HEAD W/O CM
4 series · 16 of 47 positions shown, 18 images · non-contrast
Comparison: Head CT [DATE]

CLINICAL DATA: Headache, dizziness



[Series 2: head bone · axial · 0.42mm/px · z∈[-89,-57]mm · 3 of 78 slices shown]
[im 8/78  bone]
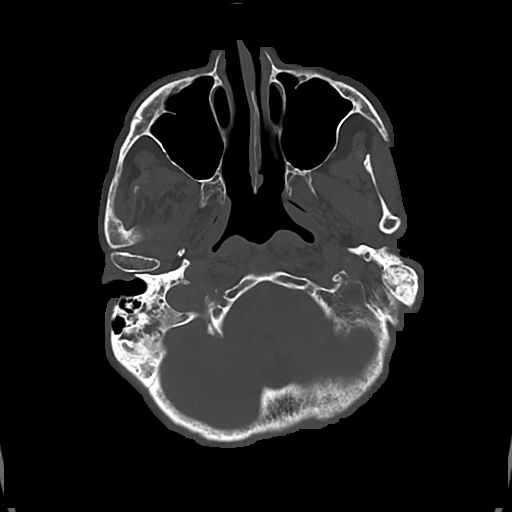
[im 16/78  bone]
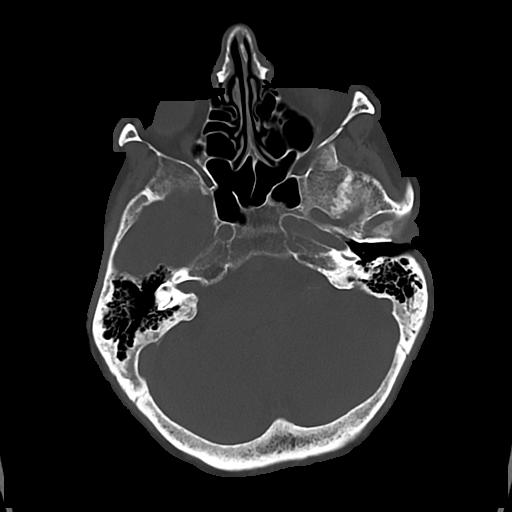
[im 24/78  bone]
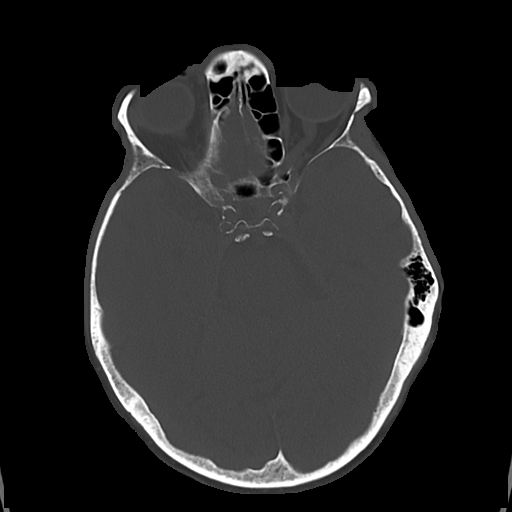

[Series 3: head wo · axial · 0.42mm/px · z∈[-88,+32]mm · 7 of 32 slices shown, 9 images]
[im 4/32  brain]
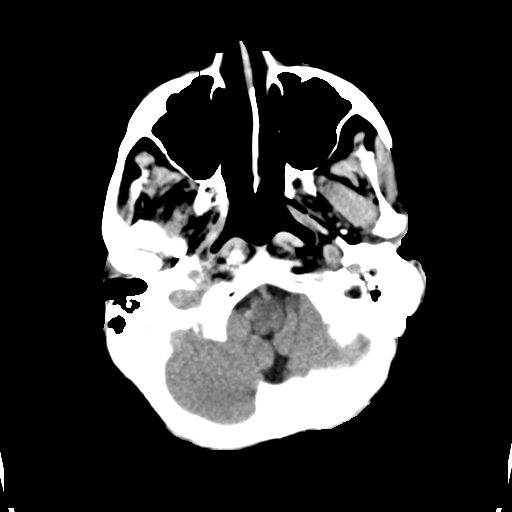
[im 4/32  bone]
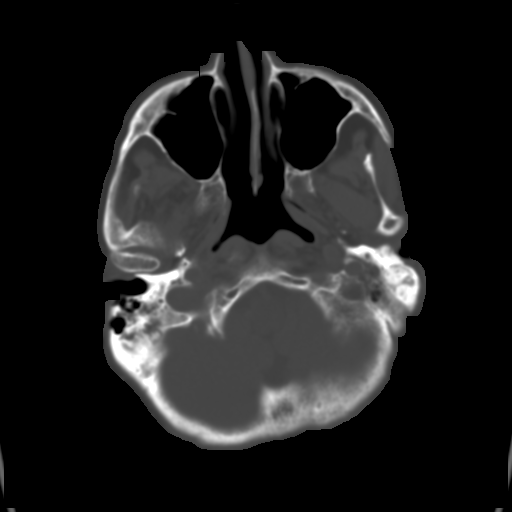
[im 8/32  brain]
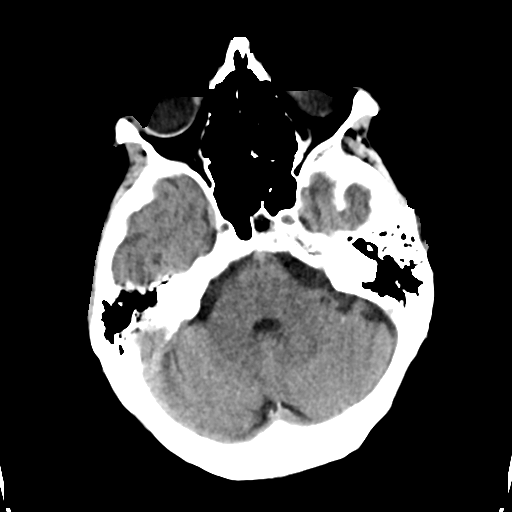
[im 12/32  brain]
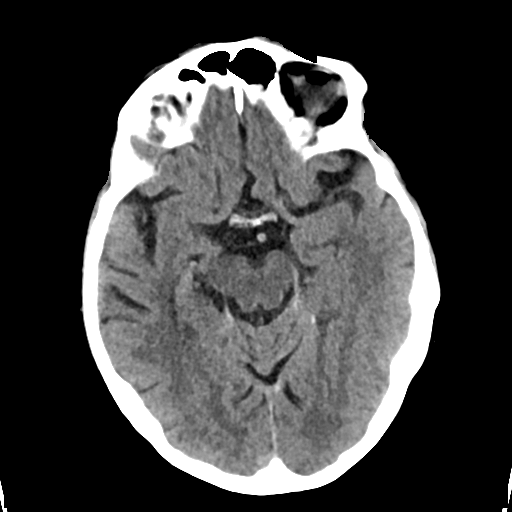
[im 16/32  brain]
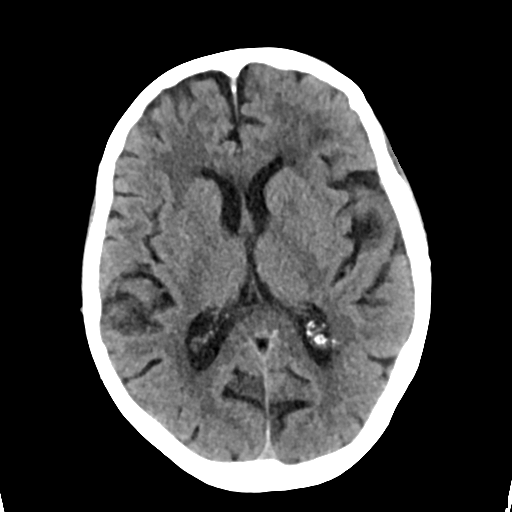
[im 20/32  brain]
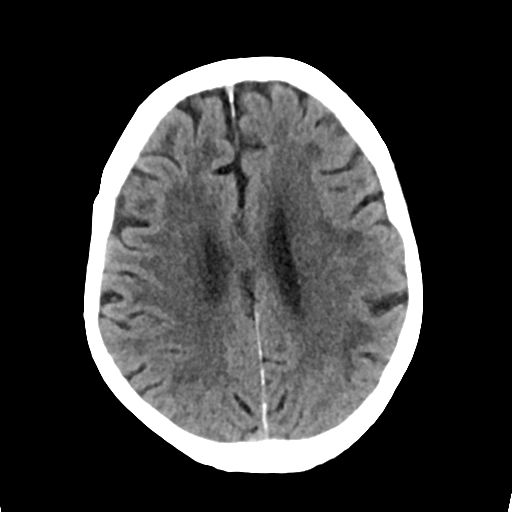
[im 20/32  bone]
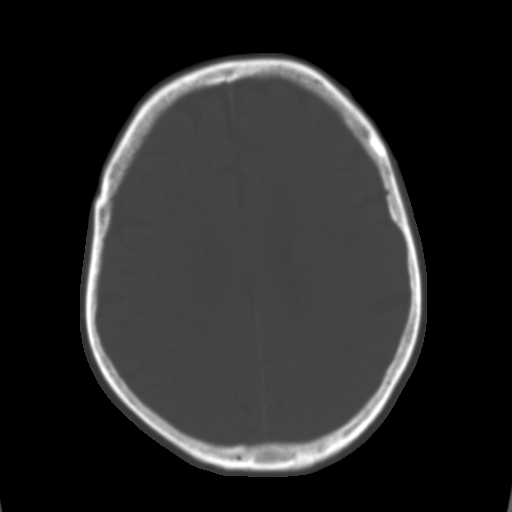
[im 24/32  brain]
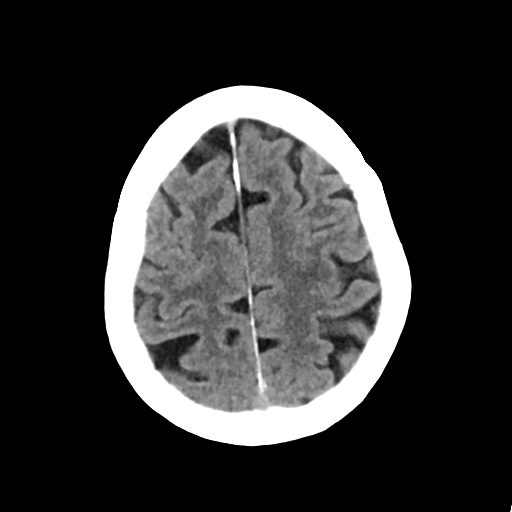
[im 28/32  brain]
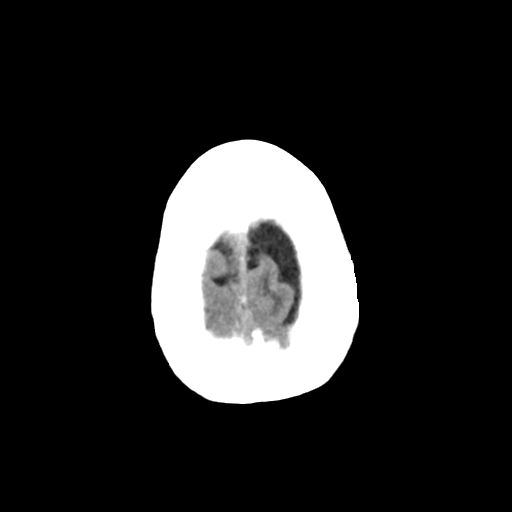

[Series 4: cor soft · coronal · 0.31mm/px · 3 of 67 slices shown]
[im 23/67  brain]
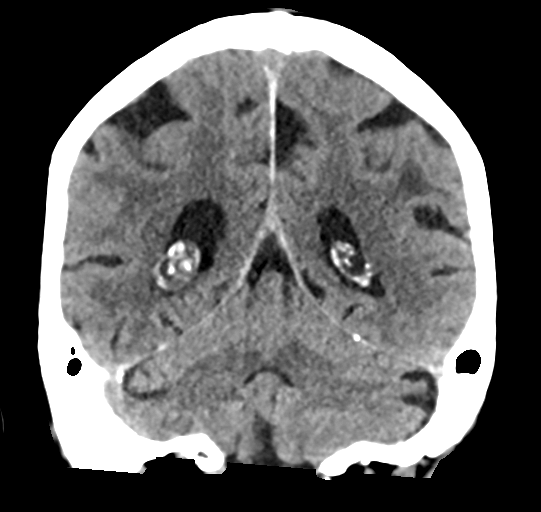
[im 30/67  brain]
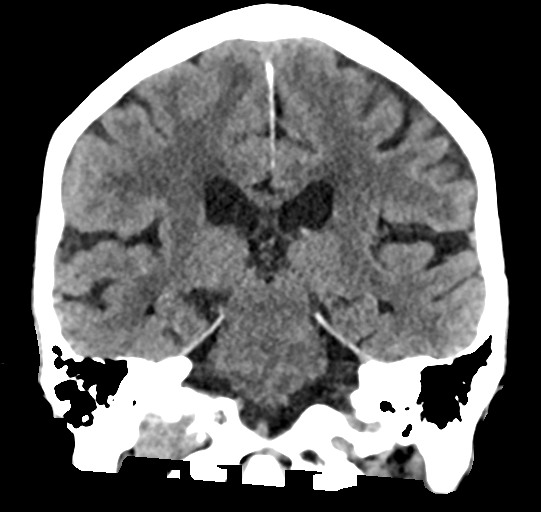
[im 37/67  brain]
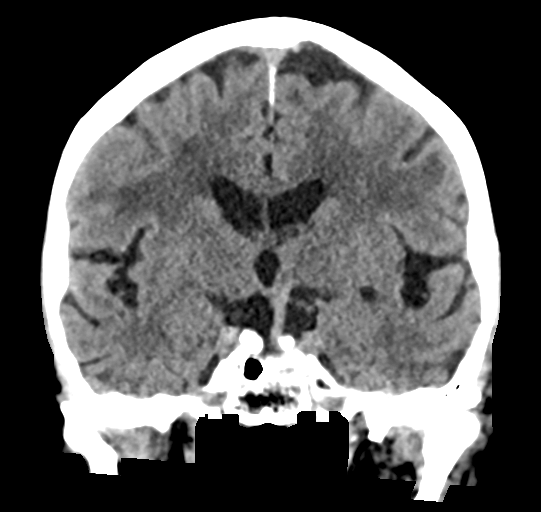

[Series 5: sag soft · sagittal · 0.31mm/px · 3 of 57 slices shown]
[im 19/57  brain]
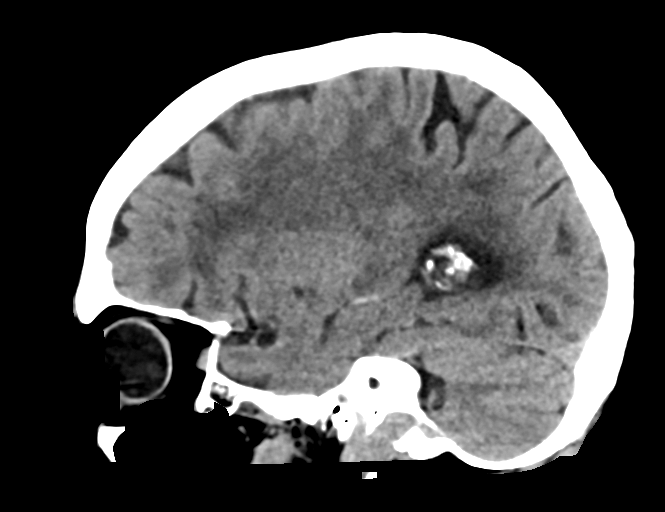
[im 29/57  brain]
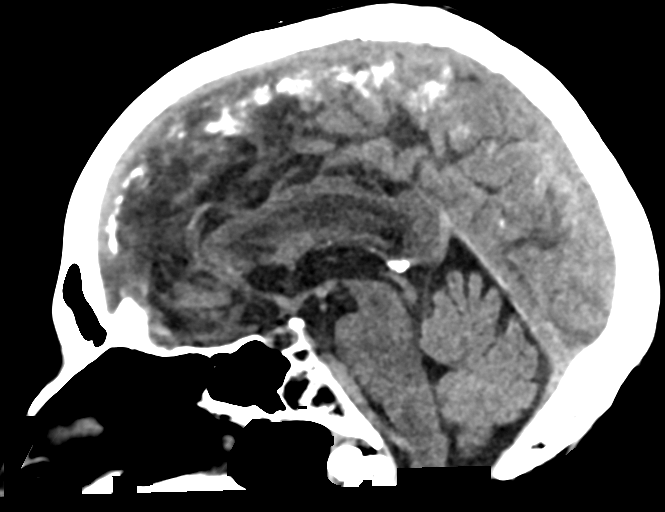
[im 38/57  brain]
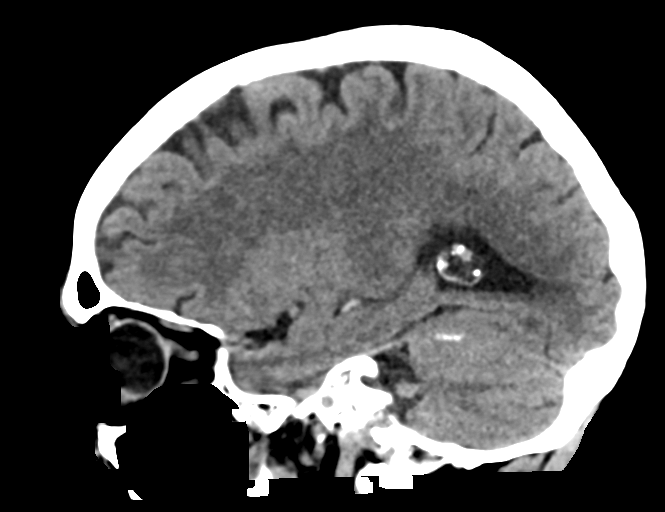

[16 of 47 positions shown; findings below may reference images not displayed]

FINDINGS: Brain: There is no evidence of acute intracranial hemorrhage,
extra-axial fluid collection, or acute infarct.

Parenchymal volume is normal for age. The ventricles are normal in
size. Gray-white differentiation is preserved. Patchy hypodensity
throughout the subcortical and periventricular white matter is
unchanged, likely reflecting sequela of chronic white matter
microangiopathy.

There is no solid mass lesion. There is no mass effect or midline
shift.

Vascular: There is calcification of the bilateral cavernous ICAs.

Skull: Normal. Negative for fracture or focal lesion.

Sinuses/Orbits: The imaged paranasal sinuses are clear. Bilateral
lens implants are in place. The globes and orbits are otherwise
unremarkable.

Other: None.
IMPRESSION: Stable head CT with no acute intracranial pathology.

## 2021-10-15 NOTE — ED Provider Notes (Signed)
? ?[facility] ?Provider Note ? ? ? Event Date/Time  ? First MD Initiated Contact with Patient 10/15/21 1050   ?  (approximate) ? ? ?History  ? ?Headache and Dizziness ? ? ?HPI ? ?Kaylee Santiago is a 87 y.o. female with past medical history of A-fib on Toprol and Xarelto, hypothyroidism on levothyroxine, HTN on metoprolol and lisinopril, hyponatremia and recent admission for management of this 3/30 4/1 as well as associated headache at that time who presents for evaluation of some dizziness, fatigue and "just not feeling quite right" she states started earlier this morning around 8 AM.  Patient states he felt normal when she woke up and slept well after taking some Ativan last night which he normally takes when she is having difficulty sleeping.  She states that she felt that she had difficulty getting her words out over this morning and thinks it lasted less than 30 minutes.  She states she felt particularly heavy in her arms felt "numb" in her hands but that this is gotten better since then.  She denies any focal weakness, vision changes, vertigo, earache, sore throat true headache.  She denies any pain in her head and states "it is difficult to describe but just did not feel right".  No recent chest pain, cough, fevers, nausea, vomiting, diarrhea, rash or recent falls or injuries.  She has a chronic bruising from her Xarelto and has been taking all her medications.  No other acute concerns at this time.  Denies EtOH use illicit drug use.  She does not take Ativan every night only sometimes as needed for insomnia ? ?  ? ? ?Physical Exam  ?Triage Vital Signs: ?ED Triage Vitals  ?Enc Vitals Group  ?   BP 10/15/21 1048 (!) 169/73  ?   Pulse Rate 10/15/21 1048 73  ?   Resp 10/15/21 1048 18  ?   Temp 10/15/21 1048 98 ?F (36.7 ?C)  ?   Temp Source 10/15/21 1048 Oral  ?   SpO2 10/15/21 1048 95 %  ?   Weight 10/15/21 1044 119 lb 0.8 oz (54 kg)  ?   Height --   ?   Head Circumference --   ?   Peak Flow  --   ?   Pain Score 10/15/21 1042 0  ?   Pain Loc --   ?   Pain Edu? --   ?   Excl. in [place]? --   ? ? ?Most recent vital signs: ?Vitals:  ? 10/15/21 1345 10/15/21 1400  ?BP:  (!) 150/74  ?Pulse: 75 79  ?Resp: (!) 23 20  ?Temp:    ?SpO2: 96% 96%  ? ? ?General: Awake, no distress.  ?CV:  Good peripheral perfusion.  2+ radial pulses. ?Resp:  Normal effort.  Clear bilaterally. ?Abd:  No distention.  Soft. ?Other:  Cranial nerves II through XII grossly intact.  No pronator drift.  No finger dysmetria.  Symmetric 5/5 strength of all extremities.  Sensation intact to light touch in all extremities.  ? ?Some scattered ecchymosis over the arms. ? ? ? ?ED Results / Procedures / Treatments  ?Labs ?(all labs ordered are listed, but only abnormal results are displayed) ?Labs Reviewed  ?PROTIME-INR - Abnormal; Notable for the following components:  ?    Result Value  ? Prothrombin Time 24.3 (*)   ? INR 2.2 (*)   ? All other components within normal limits  ?DIFFERENTIAL - Abnormal; Notable for the following components:  ?  Lymphs Abs 0.6 (*)   ? All other components within normal limits  ?COMPREHENSIVE METABOLIC PANEL - Abnormal; Notable for the following components:  ? Sodium 130 (*)   ? Chloride 93 (*)   ? Glucose, Bld 119 (*)   ? All other components within normal limits  ?URINALYSIS, COMPLETE (UACMP) WITH MICROSCOPIC - Abnormal; Notable for the following components:  ? Color, Urine STRAW (*)   ? APPearance CLEAR (*)   ? pH 9.0 (*)   ? All other components within normal limits  ?APTT  ?CBC  ?CBG MONITORING, ED  ?TROPONIN I (HIGH SENSITIVITY)  ? ? ? ?EKG ? ?ECG is remarkable for A-fib with a rate of 77, incomplete right bundle branch block with Q waves in V3 and nonspecific ST change in V2 and V3 without other clear evidence of acute ischemia or significant arrhythmia. ? ? ?RADIOLOGY ? ?CT head on my interpretation without clear evidence of hemorrhage, ischemia, edema, mass effect or other acute process.  I also reviewed  radiology's findings. ? ?MRI brain reviewed by myself without evidence of hemorrhage, acute ischemia, edema, mass effect or any other acute process.  I also reviewed radiologist interpretation and agree with the findings of same. ? ?PROCEDURES: ? ?Critical Care performed: No ? ?.1-3 Lead EKG Interpretation ?Performed by: Lucrezia Starch, MD ?Authorized by: Lucrezia Starch, MD  ? ?  Interpretation: non-specific   ?  ECG rate assessment: normal   ?  Rhythm: atrial fibrillation   ?  Ectopy: none   ?  Conduction: normal   ? ?The patient is on the cardiac monitor to evaluate for evidence of arrhythmia and/or significant heart rate changes. ? ? ?MEDICATIONS ORDERED IN ED: ?Medications - No data to display ? ? ?IMPRESSION / MDM / ASSESSMENT AND PLAN / ED COURSE  ?I reviewed the triage vital signs and the nursing notes. ?             ?               ? ?Differential diagnosis includes, but is not limited to arrhythmia, TIA/CVA, anemia, metabolic derangements, side effects of benzo use last night, possible symptomatic hypertension. ? ?ECG is remarkable for A-fib with a rate of 77, incomplete right bundle branch block with Q waves in V3 and nonspecific ST change in V2 and V3 without other clear evidence of acute ischemia or significant arrhythmia.  Nonelevated plan in absence of any chest pain is not suggestive of an anginal equivalent. ? ?CT head on my interpretation without clear evidence of hemorrhage, ischemia, edema, mass effect or other acute process.  I also reviewed radiology's findings. ? ?MRI brain reviewed by myself without evidence of hemorrhage, acute ischemia, edema, mass effect or any other acute process.  I also reviewed radiologist interpretation and agree with the findings of same. ? ?CMP shows sodium of 130 compared to 129 5 days ago without any other significant electrolyte or metabolic derangements.  CBC without leukocytosis or acute anemia.  INR is 2.2. ? ?On reassessment patient states she is feeling  better.  She is able to ambulate with steady gait unassisted.  Overall unclear etiology for symptoms at this time.  I discussed patient's presentation work-up with on-call neurologist Dr. Lorrin Goodell who felt that stroke was very unlikely given reassuring MRI and that given nonfocal exam with stable vitals and patient able to ambulate with reassuring labs that she will be appropriate for outpatient neurology follow-up.  She has an appointment in  about 1 week to see neurology.  I discussed with patient I am not exactly sure what caused her symptoms today if it could be related to high blood pressure or side effects from her Ativan last night or possibly still related to some chronic but improving hyponatremia that I do not think requires any emergent treatment at this time.  I advised her that I recommend she continue her prescribed salt tablets and she can talk with her PCP to see how long wants her to take these.  She feels safe going home and while considered further observation I think at this point I have low suspicion for immediate life-threatening process I think outpatient follow-up is reasonable.  Discharged in stable condition.  Strict return precautions advised and discussed. ? ? ? ?  ? ? ?FINAL CLINICAL IMPRESSION(S) / ED DIAGNOSES  ? ?Final diagnoses:  ?Paresthesia  ?Hypertension, unspecified type  ?Chronic hyponatremia  ?Dizziness  ? ? ? ?Rx / DC Orders  ? ?ED Discharge Orders   ? ? None  ? ?  ? ? ? ?Note:  This document was prepared using Dragon voice recognition software and may include unintentional dictation errors. ?  ?Lucrezia Starch, MD ?10/15/21 1420 ? ?

## 2021-10-15 NOTE — ED Notes (Signed)
Pt. Was ambulated by this tech and had a pulse ox reading of 95%.  ?

## 2021-10-15 NOTE — ED Notes (Addendum)
First nurse note: pt comes ems for headache, dizziness. Was seen here 5 days ago for the same. No cp, shob, n/v/d. 143/69, HR irregular afib with hx of same. CBG 173. 98%. 20G L FA. 3 days ago started wellbutrin and 2 weeks ago started on lisinopril.  ? ?Pt states that she had trouble finding her words this morning but is able to speak without trouble now. Pt states that she had some generalized weakness and tingling all over. Noticed it when she got up this morning. Last normal was when she went to bed last night.  ?

## 2021-10-20 ENCOUNTER — Emergency Department: Payer: Medicare Other

## 2021-10-20 ENCOUNTER — Other Ambulatory Visit: Payer: Self-pay

## 2021-10-20 ENCOUNTER — Emergency Department
Admission: EM | Admit: 2021-10-20 | Discharge: 2021-10-20 | Disposition: A | Discharge status: Home / Self Care | Payer: Medicare Other | Attending: Emergency Medicine | Admitting: Emergency Medicine

## 2021-10-20 DIAGNOSIS — R519 Headache, unspecified: Secondary | ICD-10-CM | POA: Diagnosis present

## 2021-10-20 DIAGNOSIS — I480 Paroxysmal atrial fibrillation: Secondary | ICD-10-CM | POA: Diagnosis not present

## 2021-10-20 DIAGNOSIS — I10 Essential (primary) hypertension: Secondary | ICD-10-CM | POA: Diagnosis not present

## 2021-10-20 LAB — COMPREHENSIVE METABOLIC PANEL
ALT: 27 U/L (ref 0–44)
AST: 31 U/L (ref 15–41)
Albumin: 4 g/dL (ref 3.5–5.0)
Alkaline Phosphatase: 50 U/L (ref 38–126)
Anion gap: 9 (ref 5–15)
BUN: 15 mg/dL (ref 8–23)
CO2: 30 mmol/L (ref 22–32)
Calcium: 9.4 mg/dL (ref 8.9–10.3)
Chloride: 91 mmol/L — ABNORMAL LOW (ref 98–111)
Creatinine, Ser: 0.57 mg/dL (ref 0.44–1.00)
GFR, Estimated: >60 mL/min (ref >60)
Glucose, Bld: 109 mg/dL — ABNORMAL HIGH (ref 70–99)
Potassium: 4.3 mmol/L (ref 3.5–5.1)
Sodium: 130 mmol/L — ABNORMAL LOW (ref 135–145)
Total Bilirubin: 0.7 mg/dL (ref 0.3–1.2)
Total Protein: 7.4 g/dL (ref 6.5–8.1)

## 2021-10-20 LAB — CBC
HCT: 39.5 % (ref 36.0–46.0)
Hemoglobin: 12.8 g/dL (ref 12.0–15.0)
MCH: 30 pg (ref 26.0–34.0)
MCHC: 32.4 g/dL (ref 30.0–36.0)
MCV: 92.5 fL (ref 80.0–100.0)
Platelets: 254 10*3/uL (ref 150–400)
RBC: 4.27 MIL/uL (ref 3.87–5.11)
RDW: 12.9 % (ref 11.5–15.5)
WBC: 6.1 10*3/uL (ref 4.0–10.5)
nRBC: 0 % (ref 0.0–0.2)

## 2021-10-20 IMAGING — CT CT HEAD W/O CM
4 series · 17 of 47 positions shown, 19 images · non-contrast
Comparison: CT head MRI head [DATE]

CLINICAL DATA: Mental status change



[Series 2: head wo · axial · 0.44mm/px · z∈[-40,+80]mm · 7 of 33 slices shown, 9 images]
[im 5/33  brain]
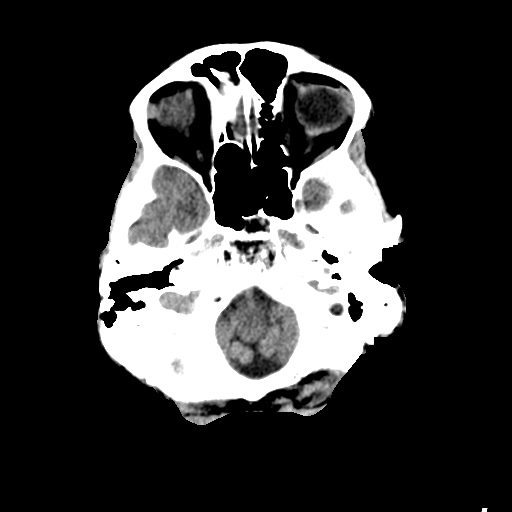
[im 5/33  bone]
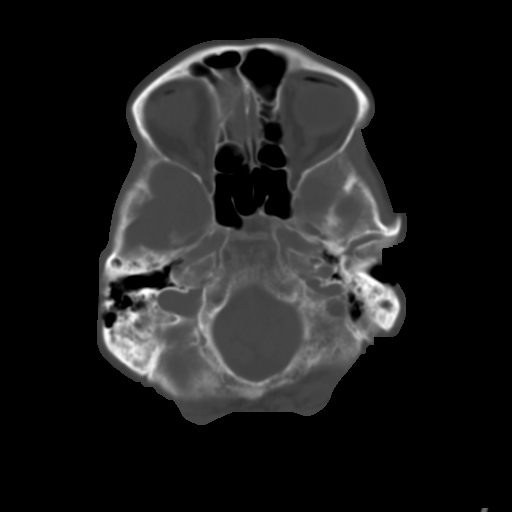
[im 9/33  brain]
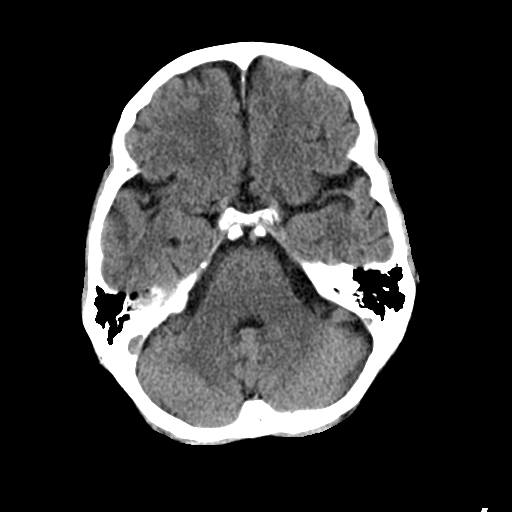
[im 13/33  brain]
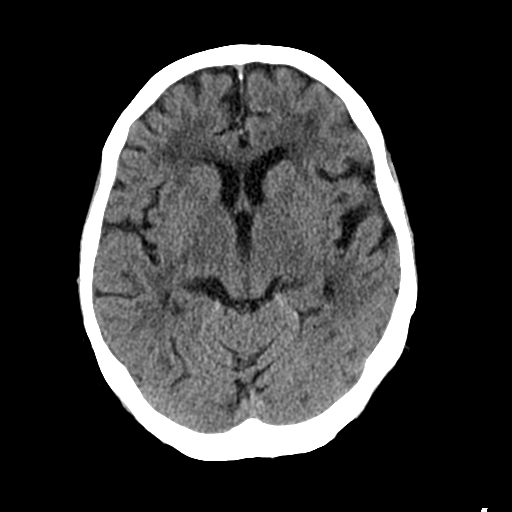
[im 17/33  brain]
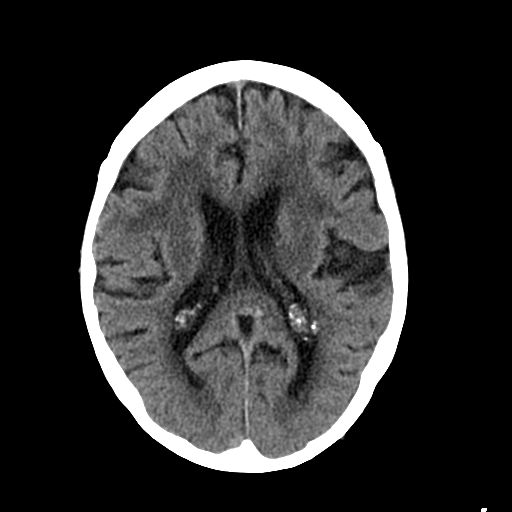
[im 21/33  brain]
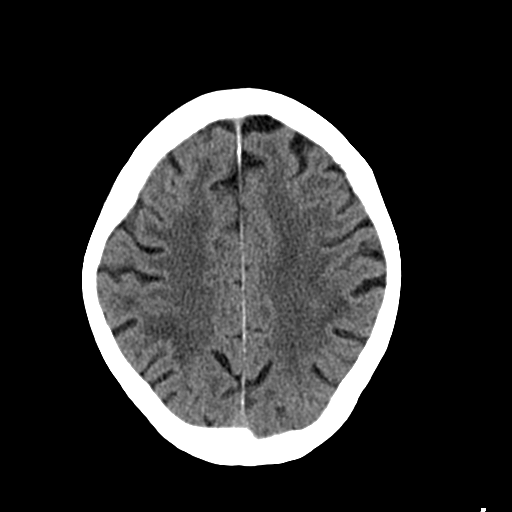
[im 21/33  bone]
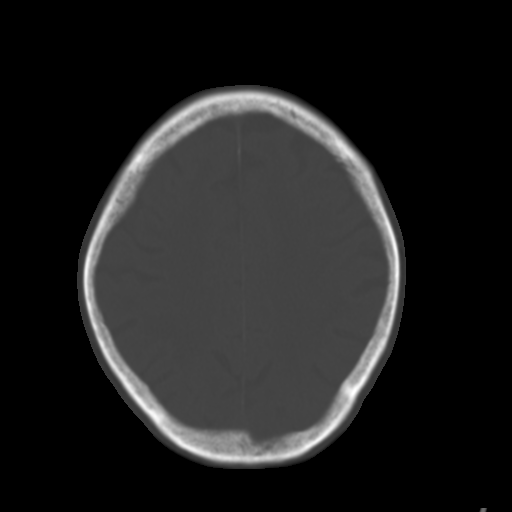
[im 25/33  brain]
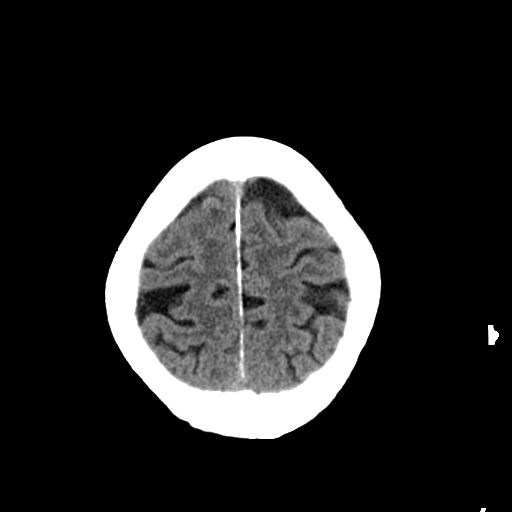
[im 29/33  brain]
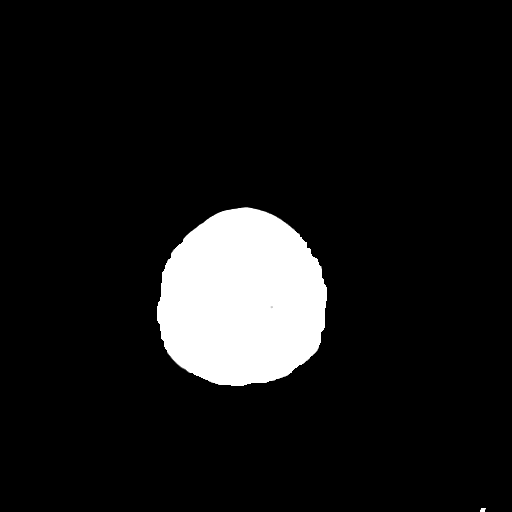

[Series 3: head bone · axial · 0.44mm/px · z∈[-44,+12]mm · 4 of 81 slices shown]
[im 9/81  bone]
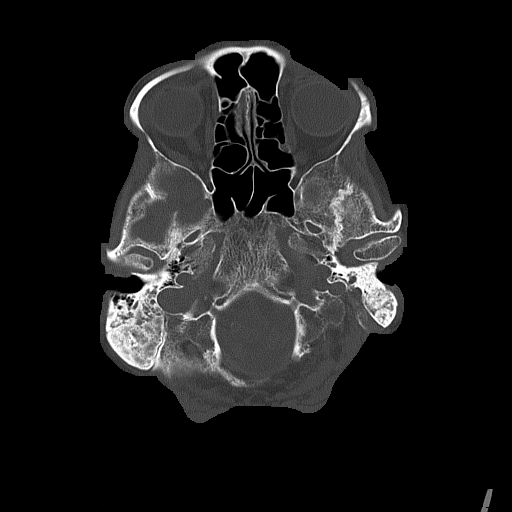
[im 17/81  bone]
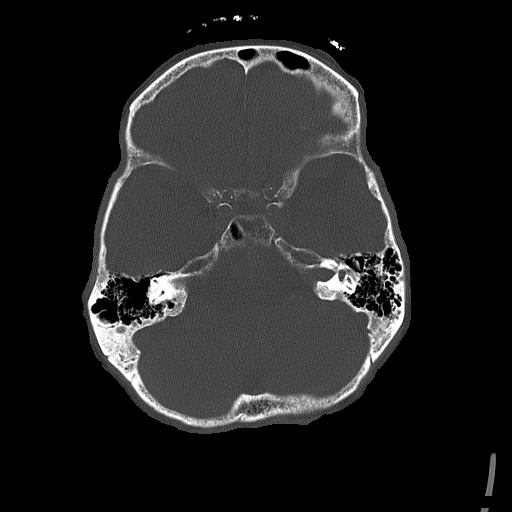
[im 25/81  bone]
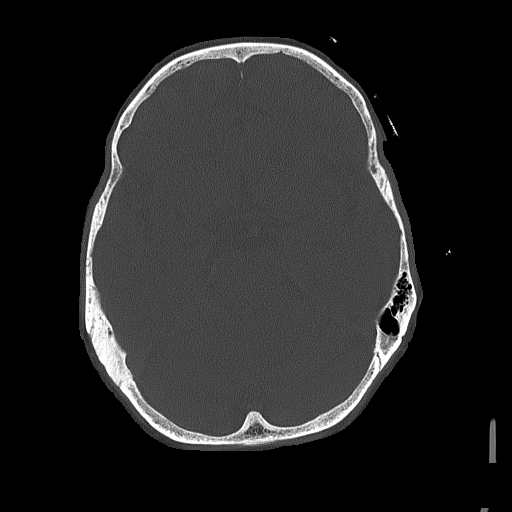
[im 37/81  bone]
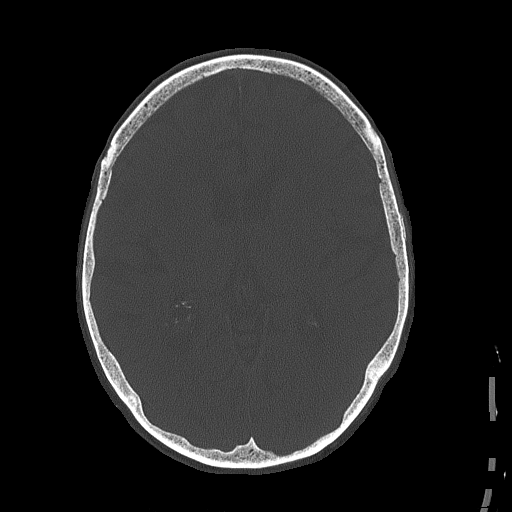

[Series 4: coronal soft tissue · coronal · 0.31mm/px · 3 of 67 slices shown]
[im 23/67  brain]
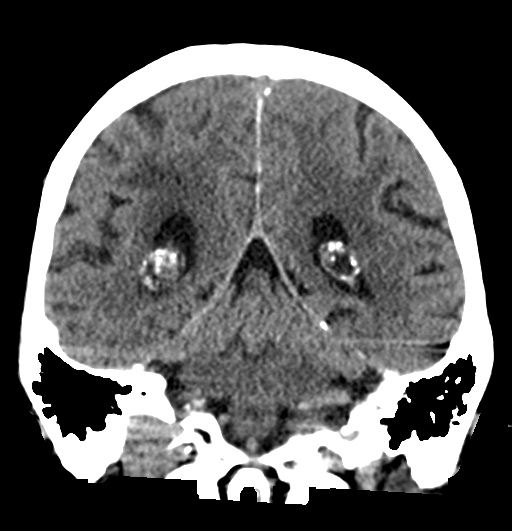
[im 30/67  brain]
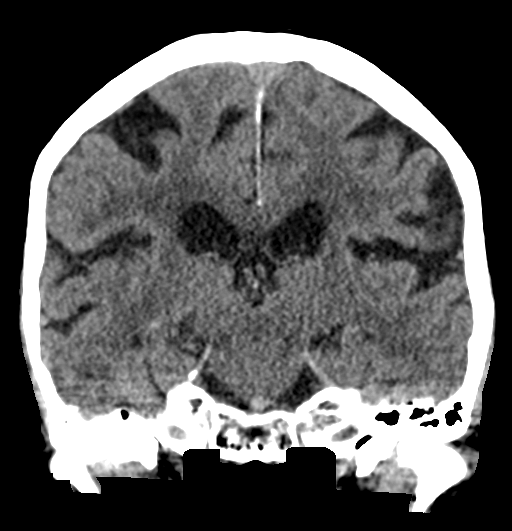
[im 37/67  brain]
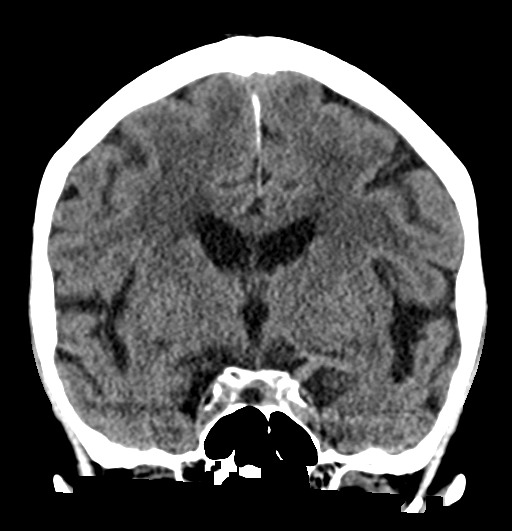

[Series 5: sagittal soft tissue · sagittal · 0.34mm/px · 3 of 53 slices shown]
[im 18/53  brain]
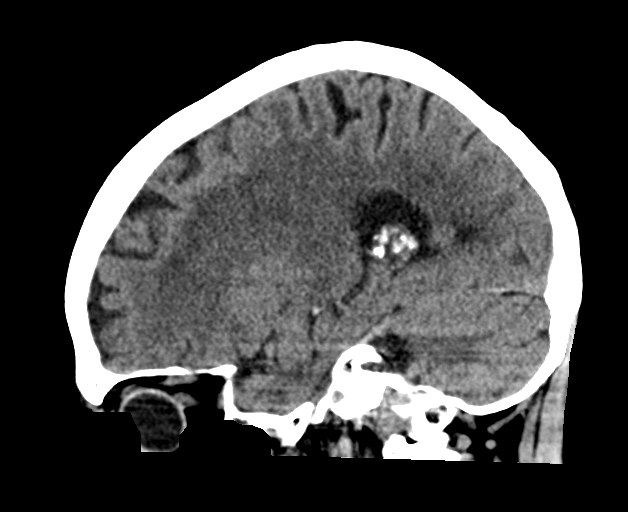
[im 27/53  brain]
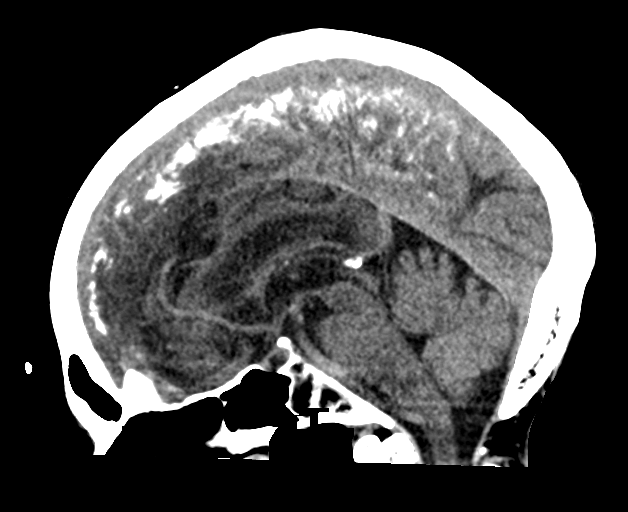
[im 35/53  brain]
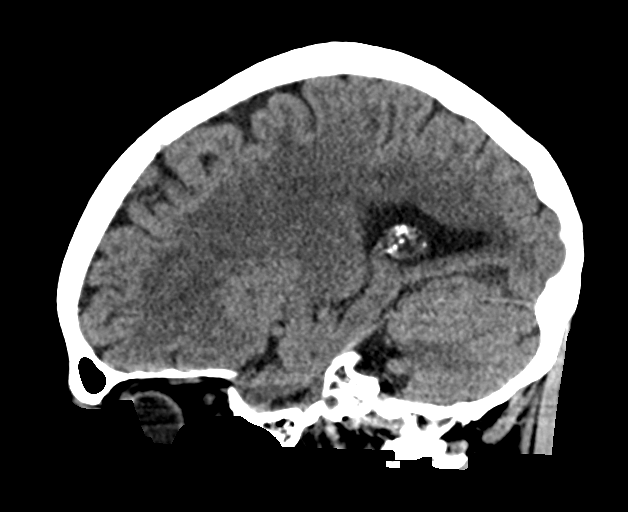

[17 of 47 positions shown; findings below may reference images not displayed]

FINDINGS: Brain: Mild generalized atrophy without hydrocephalus. Moderate
white matter hypodensity diffusely, unchanged.

Negative for acute infarct, hemorrhage, mass

Vascular: Negative for hyperdense vessel

Skull: Negative

Sinuses/Orbits: Paranasal sinuses clear. Bilateral cataract
extraction

Other: None
IMPRESSION: Generalized atrophy with moderate chronic microvascular ischemia

No acute abnormality no change from recent CT and MRI.

## 2021-10-20 NOTE — ED Triage Notes (Addendum)
Pt comes pov for headache and HTN. Home readings of 216/105. Has been taking her BP meds like normal. Increased doses yesterday. Pt is on thinners. Pt thinks she is unable to think straight but is answering questions normally at this time.  ?

## 2021-10-20 NOTE — ED Provider Notes (Signed)
? ?[facility] ?Provider Note ? ? ? Event Date/Time  ? First MD Initiated Contact with Patient 10/20/21 1748   ?  (approximate) ? ? ?History  ? ?Headache and Hypertension ? ? ?HPI ? ?Kaylee Santiago is a 87 y.o. female with history of paroxysmal atrial fibrillation, hypertension who presents with complaints of headache and high blood pressure.  Patient reports she had a headache today, which checked her blood pressure and found it to be over 200 systolic.  Spoke to her doctor who recommended taking clonidine and coming to the ED if no improvement. ? ?She reports to clonidine around 3:30 PM.  She states at this time she is feeling much better, no further headache, no neurodeficits, no chest pain, no nausea or vomiting ?  ? ? ?Physical Exam  ? ?Triage Vital Signs: ?ED Triage Vitals  ?Enc Vitals Group  ?   BP 10/20/21 1648 (!) 169/74  ?   Pulse Rate 10/20/21 1648 91  ?   Resp 10/20/21 1648 16  ?   Temp 10/20/21 1648 98 ?F (36.7 ?C)  ?   Temp Source 10/20/21 1648 Oral  ?   SpO2 10/20/21 1648 93 %  ?   Weight 10/20/21 1647 54 kg (119 lb 0.8 oz)  ?   Height 10/20/21 1804 1.803 m (5\' 11" )  ?   Head Circumference --   ?   Peak Flow --   ?   Pain Score 10/20/21 1803 3  ?   Pain Loc --   ?   Pain Edu? --   ?   Excl. in [place]? --   ? ? ?Most recent vital signs: ?Vitals:  ? 10/20/21 1648 10/20/21 1803  ?BP: (!) 169/74 (!) 160/70  ?Pulse: 91 88  ?Resp: 16 16  ?Temp: 98 ?F (36.7 ?C)   ?SpO2: 93% 94%  ? ? ? ?General: Awake, no distress.  ?CV:  Good peripheral perfusion.  ?Resp:  Normal effort.  ?Abd:  No distention.  ?Other:  Normal neurologic exam ? ? ?ED Results / Procedures / Treatments  ? ?Labs ?(all labs ordered are listed, but only abnormal results are displayed) ?Labs Reviewed  ?COMPREHENSIVE METABOLIC PANEL - Abnormal; Notable for the following components:  ?    Result Value  ? Sodium 130 (*)   ? Chloride 91 (*)   ? Glucose, Bld 109 (*)   ? All other components within normal limits  ?CBC   ? ? ? ?EKG ? ? ? ? ?RADIOLOGY ?CT head reviewed by me, no acute abnormality ? ? ? ?PROCEDURES: ? ?Critical Care performed:  ? ?Procedures ? ? ?MEDICATIONS ORDERED IN ED: ?Medications - No data to display ? ? ?IMPRESSION / MDM / ASSESSMENT AND PLAN / ED COURSE  ?I reviewed the triage vital signs and the nursing notes. ? ? ?Patient well-appearing and in no acute distress, she reports her symptoms have improved significantly, no further headache.  Neuro intact, no chest pain or palpitations. ? ?She reports compliance with her medications.  Blood pressure here is in the 160 systolic.  No indication for aggressive IV management at this time, considered admission however given improving blood pressures appropriate for discharge with outpatient follow-up, she has neurology follow-up scheduled tomorrow and is quite comfortable with this plan. ? ? ? ? ?  ? ? ?FINAL CLINICAL IMPRESSION(S) / ED DIAGNOSES  ? ?Final diagnoses:  ?Primary hypertension  ? ? ? ?Rx / DC Orders  ? ?ED Discharge Orders   ? ?  None  ? ?  ? ? ? ?Note:  This document was prepared using Dragon voice recognition software and may include unintentional dictation errors. ?  ?Jene Every, MD ?10/20/21 1853 ? ?

## 2021-11-07 ENCOUNTER — Inpatient Hospital Stay
Admission: EM | Admit: 2021-11-07 | Discharge: 2021-11-09 | DRG: 057 | Disposition: A | Discharge status: Skilled Nursing Facility | Payer: Medicare Other | Source: Skilled Nursing Facility | Attending: Hospitalist | Admitting: Hospitalist

## 2021-11-07 ENCOUNTER — Other Ambulatory Visit: Payer: Self-pay

## 2021-11-07 ENCOUNTER — Emergency Department: Payer: Medicare Other

## 2021-11-07 DIAGNOSIS — I69354 Hemiplegia and hemiparesis following cerebral infarction affecting left non-dominant side: Principal | ICD-10-CM

## 2021-11-07 DIAGNOSIS — Z79899 Other long term (current) drug therapy: Secondary | ICD-10-CM

## 2021-11-07 DIAGNOSIS — R4701 Aphasia: Secondary | ICD-10-CM | POA: Diagnosis present

## 2021-11-07 DIAGNOSIS — Z90710 Acquired absence of both cervix and uterus: Secondary | ICD-10-CM | POA: Diagnosis not present

## 2021-11-07 DIAGNOSIS — Z885 Allergy status to narcotic agent status: Secondary | ICD-10-CM

## 2021-11-07 DIAGNOSIS — F419 Anxiety disorder, unspecified: Secondary | ICD-10-CM | POA: Diagnosis present

## 2021-11-07 DIAGNOSIS — I69391 Dysphagia following cerebral infarction: Secondary | ICD-10-CM

## 2021-11-07 DIAGNOSIS — Z8249 Family history of ischemic heart disease and other diseases of the circulatory system: Secondary | ICD-10-CM | POA: Diagnosis not present

## 2021-11-07 DIAGNOSIS — E871 Hypo-osmolality and hyponatremia: Secondary | ICD-10-CM | POA: Diagnosis present

## 2021-11-07 DIAGNOSIS — I69392 Facial weakness following cerebral infarction: Secondary | ICD-10-CM

## 2021-11-07 DIAGNOSIS — Z87891 Personal history of nicotine dependence: Secondary | ICD-10-CM

## 2021-11-07 DIAGNOSIS — Z931 Gastrostomy status: Secondary | ICD-10-CM | POA: Diagnosis not present

## 2021-11-07 DIAGNOSIS — I69322 Dysarthria following cerebral infarction: Secondary | ICD-10-CM | POA: Diagnosis not present

## 2021-11-07 DIAGNOSIS — R531 Weakness: Principal | ICD-10-CM

## 2021-11-07 DIAGNOSIS — I69320 Aphasia following cerebral infarction: Secondary | ICD-10-CM | POA: Diagnosis not present

## 2021-11-07 DIAGNOSIS — I69922 Dysarthria following unspecified cerebrovascular disease: Secondary | ICD-10-CM

## 2021-11-07 DIAGNOSIS — I480 Paroxysmal atrial fibrillation: Secondary | ICD-10-CM | POA: Diagnosis present

## 2021-11-07 DIAGNOSIS — R29708 NIHSS score 8: Secondary | ICD-10-CM | POA: Diagnosis present

## 2021-11-07 DIAGNOSIS — I4819 Other persistent atrial fibrillation: Secondary | ICD-10-CM | POA: Diagnosis present

## 2021-11-07 DIAGNOSIS — Z7901 Long term (current) use of anticoagulants: Secondary | ICD-10-CM

## 2021-11-07 DIAGNOSIS — I10 Essential (primary) hypertension: Secondary | ICD-10-CM | POA: Diagnosis present

## 2021-11-07 DIAGNOSIS — I63511 Cerebral infarction due to unspecified occlusion or stenosis of right middle cerebral artery: Principal | ICD-10-CM | POA: Diagnosis present

## 2021-11-07 DIAGNOSIS — Z79890 Hormone replacement therapy (postmenopausal): Secondary | ICD-10-CM

## 2021-11-07 DIAGNOSIS — Z881 Allergy status to other antibiotic agents status: Secondary | ICD-10-CM

## 2021-11-07 DIAGNOSIS — I639 Cerebral infarction, unspecified: Secondary | ICD-10-CM | POA: Diagnosis not present

## 2021-11-07 DIAGNOSIS — Z20822 Contact with and (suspected) exposure to covid-19: Secondary | ICD-10-CM | POA: Diagnosis present

## 2021-11-07 DIAGNOSIS — E039 Hypothyroidism, unspecified: Secondary | ICD-10-CM | POA: Diagnosis present

## 2021-11-07 DIAGNOSIS — G8191 Hemiplegia, unspecified affecting right dominant side: Secondary | ICD-10-CM | POA: Diagnosis present

## 2021-11-07 DIAGNOSIS — Z961 Presence of intraocular lens: Secondary | ICD-10-CM | POA: Diagnosis present

## 2021-11-07 HISTORY — DX: Cerebral infarction, unspecified: I63.9

## 2021-11-07 LAB — CBC
HCT: 39 % (ref 36.0–46.0)
Hemoglobin: 12.9 g/dL (ref 12.0–15.0)
MCH: 30.2 pg (ref 26.0–34.0)
MCHC: 33.1 g/dL (ref 30.0–36.0)
MCV: 91.3 fL (ref 80.0–100.0)
Platelets: 333 10*3/uL (ref 150–400)
RBC: 4.27 MIL/uL (ref 3.87–5.11)
RDW: 12.5 % (ref 11.5–15.5)
WBC: 10.9 10*3/uL — ABNORMAL HIGH (ref 4.0–10.5)
nRBC: 0 % (ref 0.0–0.2)

## 2021-11-07 LAB — RESP PANEL BY RT-PCR (FLU A&B, COVID) ARPGX2
Influenza A by PCR: NEGATIVE
Influenza B by PCR: NEGATIVE
SARS Coronavirus 2 by RT PCR: NEGATIVE

## 2021-11-07 LAB — APTT: aPTT: 26 seconds (ref 24–36)

## 2021-11-07 LAB — DIFFERENTIAL
Abs Immature Granulocytes: 0.04 10*3/uL (ref 0.00–0.07)
Basophils Absolute: 0 10*3/uL (ref 0.0–0.1)
Basophils Relative: 0 %
Eosinophils Absolute: 0 10*3/uL (ref 0.0–0.5)
Eosinophils Relative: 0 %
Immature Granulocytes: 0 %
Lymphocytes Relative: 6 %
Lymphs Abs: 0.7 10*3/uL (ref 0.7–4.0)
Monocytes Absolute: 0.8 10*3/uL (ref 0.1–1.0)
Monocytes Relative: 7 %
Neutro Abs: 9.4 10*3/uL — ABNORMAL HIGH (ref 1.7–7.7)
Neutrophils Relative %: 87 %

## 2021-11-07 LAB — COMPREHENSIVE METABOLIC PANEL
ALT: 25 U/L (ref 0–44)
AST: 35 U/L (ref 15–41)
Albumin: 3.4 g/dL — ABNORMAL LOW (ref 3.5–5.0)
Alkaline Phosphatase: 53 U/L (ref 38–126)
Anion gap: 6 (ref 5–15)
BUN: 19 mg/dL (ref 8–23)
CO2: 28 mmol/L (ref 22–32)
Calcium: 8.8 mg/dL — ABNORMAL LOW (ref 8.9–10.3)
Chloride: 95 mmol/L — ABNORMAL LOW (ref 98–111)
Creatinine, Ser: 0.56 mg/dL (ref 0.44–1.00)
GFR, Estimated: >60 mL/min (ref >60)
Glucose, Bld: 103 mg/dL — ABNORMAL HIGH (ref 70–99)
Potassium: 4.4 mmol/L (ref 3.5–5.1)
Sodium: 129 mmol/L — ABNORMAL LOW (ref 135–145)
Total Bilirubin: 0.5 mg/dL (ref 0.3–1.2)
Total Protein: 6.5 g/dL (ref 6.5–8.1)

## 2021-11-07 LAB — ETHANOL: Alcohol, Ethyl (B): <10 mg/dL (ref <10)

## 2021-11-07 LAB — CBG MONITORING, ED: Glucose-Capillary: 102 mg/dL — ABNORMAL HIGH (ref 70–99)

## 2021-11-07 LAB — PROTIME-INR
INR: 1 (ref 0.8–1.2)
Prothrombin Time: 13.3 seconds (ref 11.4–15.2)

## 2021-11-07 IMAGING — CT CT HEAD CODE STROKE
4 series · 16 of 47 positions shown, 18 images · non-contrast
Comparison: [DATE]

CLINICAL DATA: Code stroke.  Right-sided weakness



[Series 3: head wo · axial · 0.42mm/px · z∈[-91,+24]mm · 7 of 31 slices shown, 9 images]
[im 4/31  brain]
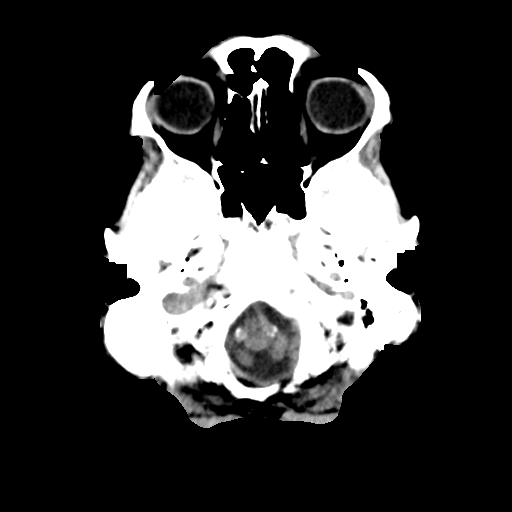
[im 4/31  bone]
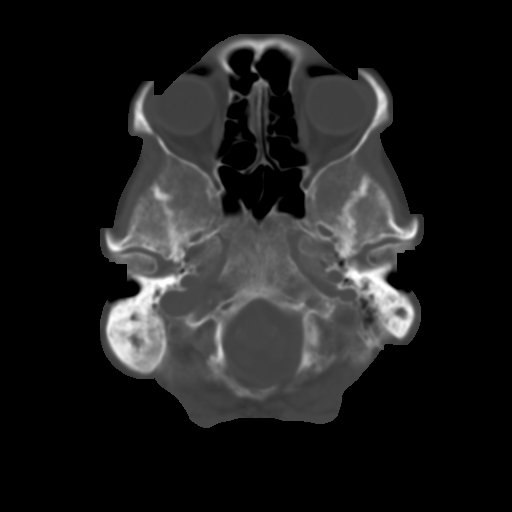
[im 8/31  brain]
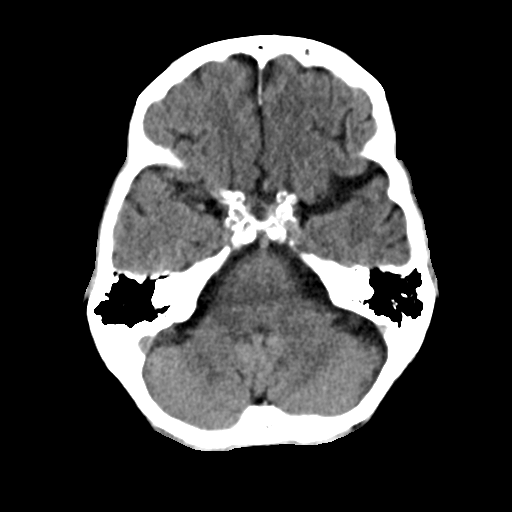
[im 12/31  brain]
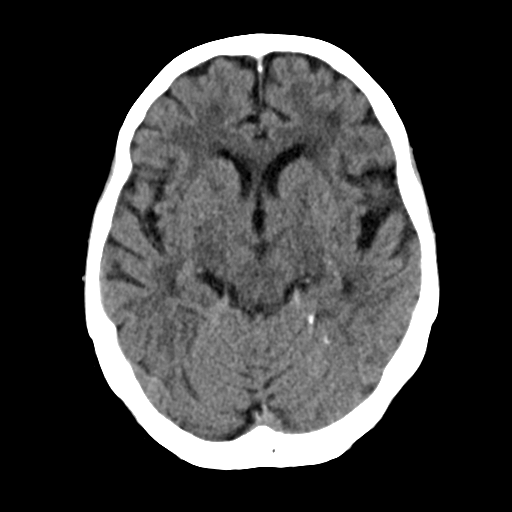
[im 16/31  brain]
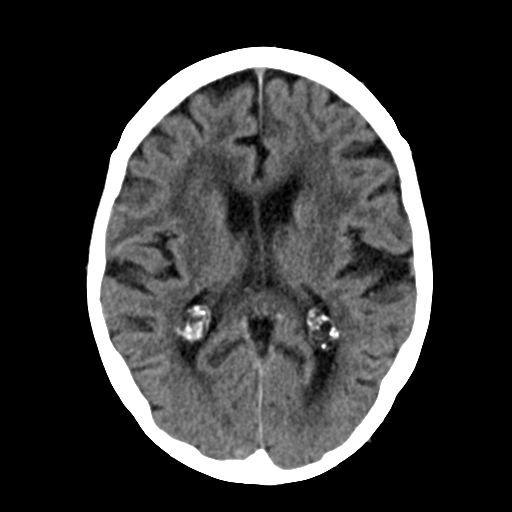
[im 19/31  brain]
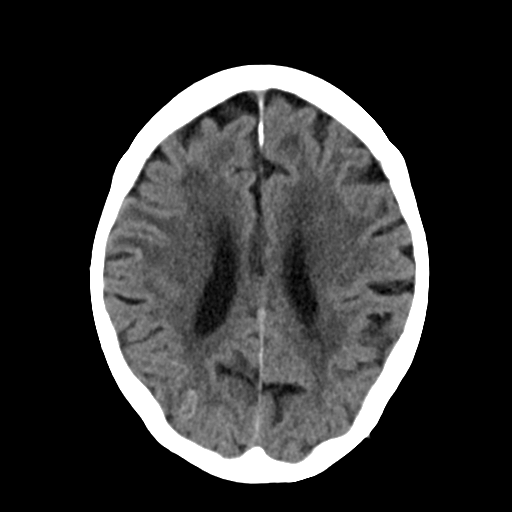
[im 19/31  bone]
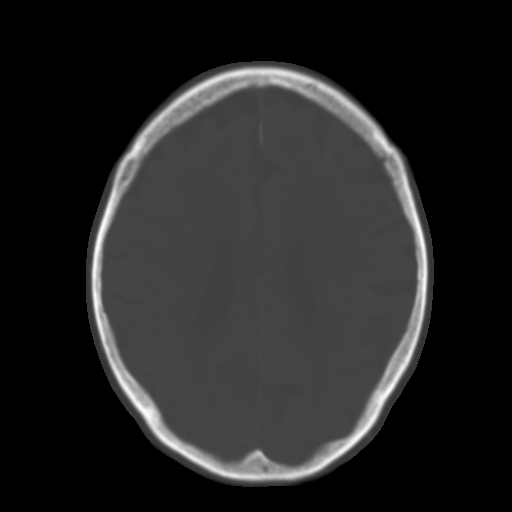
[im 23/31  brain]
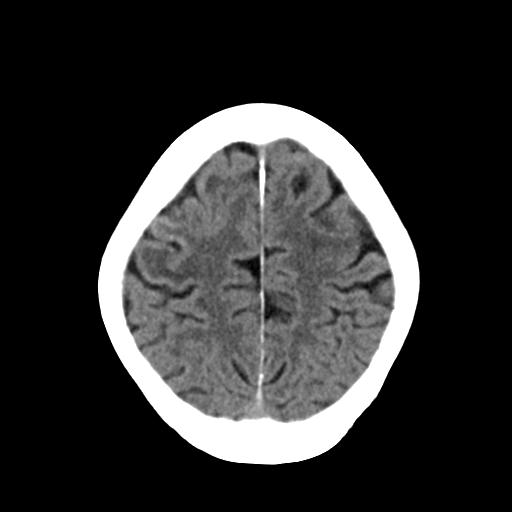
[im 27/31  brain]
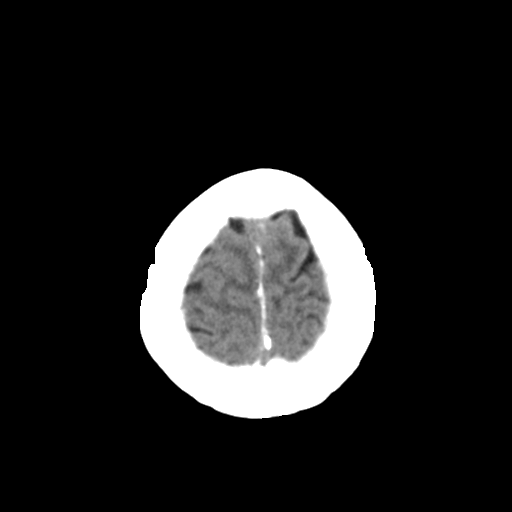

[Series 4: head bone · axial · 0.42mm/px · z∈[-92,-62]mm · 3 of 77 slices shown]
[im 8/77  bone]
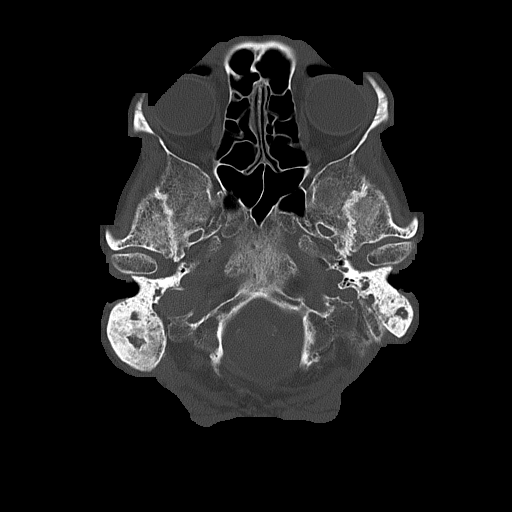
[im 16/77  bone]
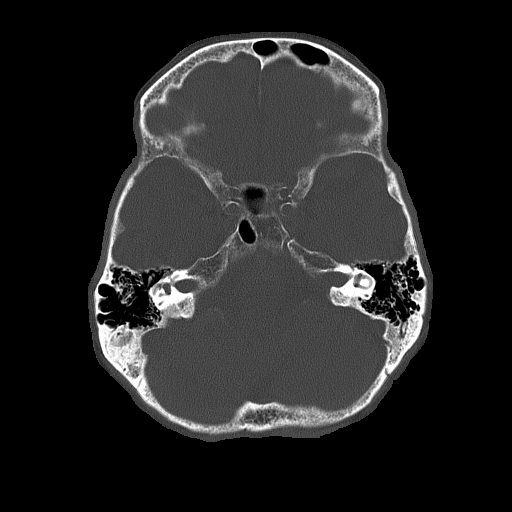
[im 23/77  bone]
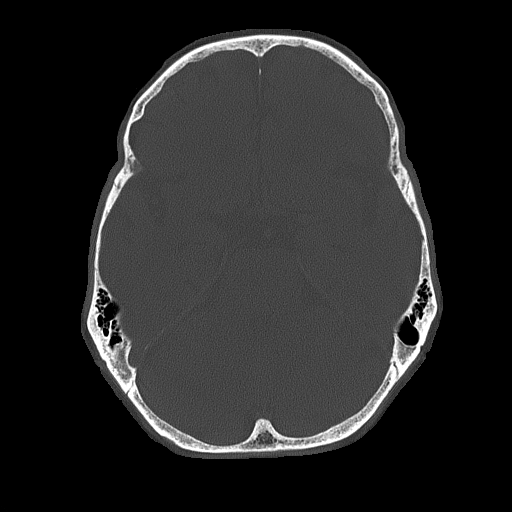

[Series 5: coronal soft tissue · coronal · 0.33mm/px · 3 of 71 slices shown]
[im 24/71  brain]
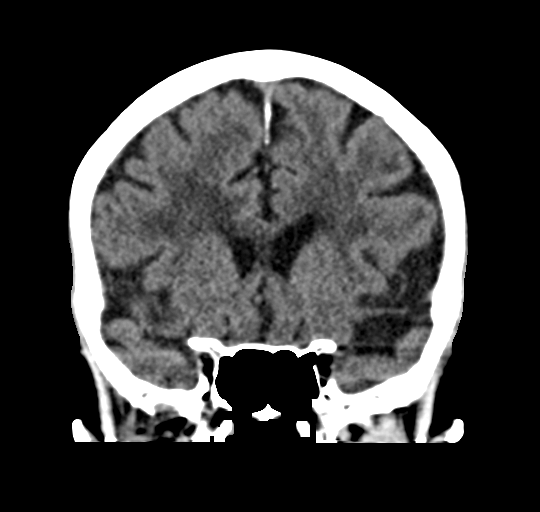
[im 32/71  brain]
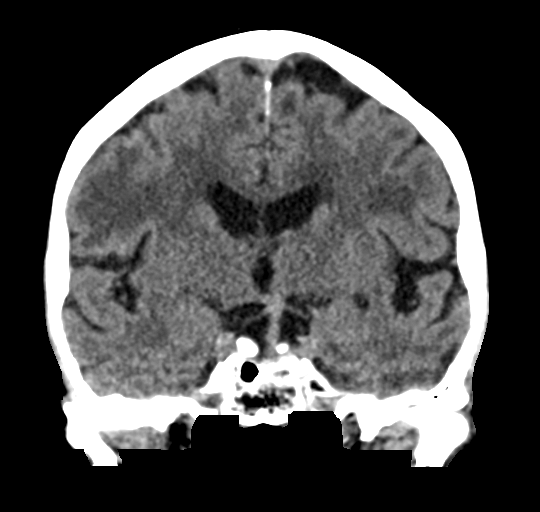
[im 39/71  brain]
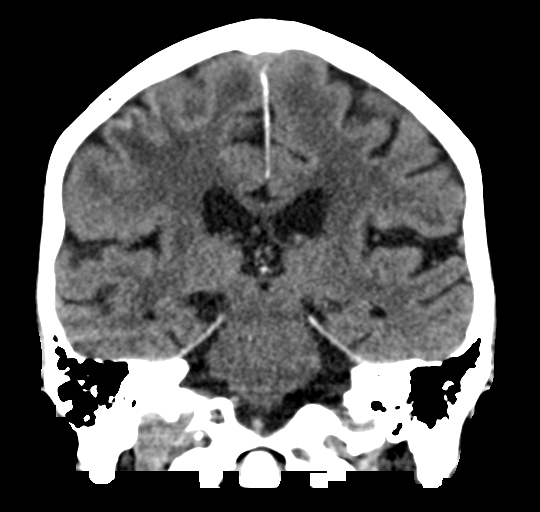

[Series 6: sagittal soft tissue · sagittal · 0.36mm/px · 3 of 59 slices shown]
[im 20/59  brain]
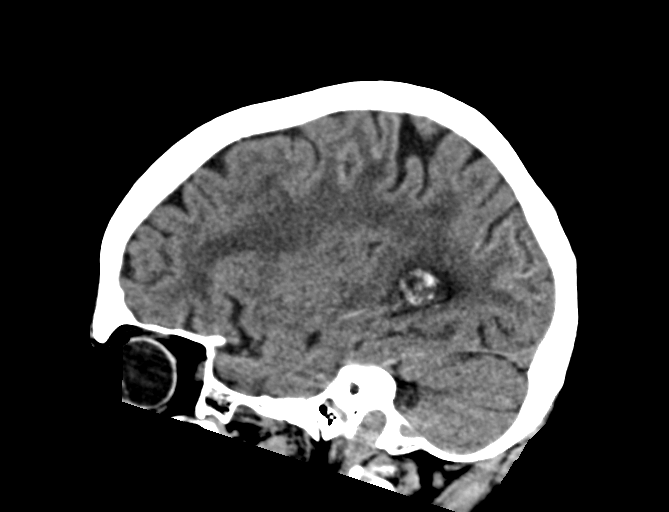
[im 30/59  brain]
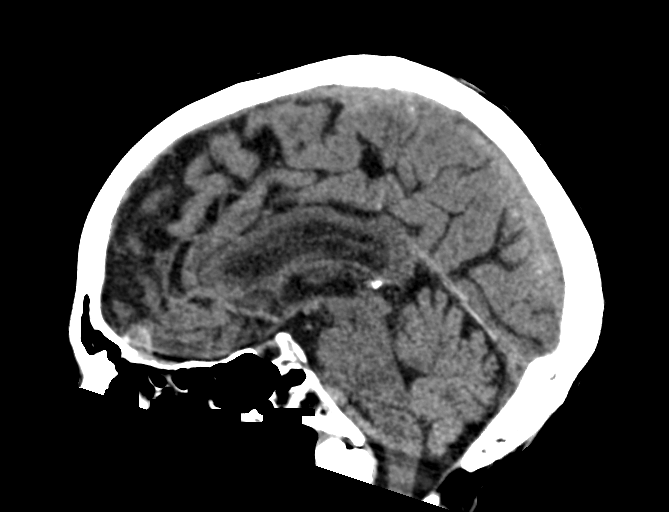
[im 39/59  brain]
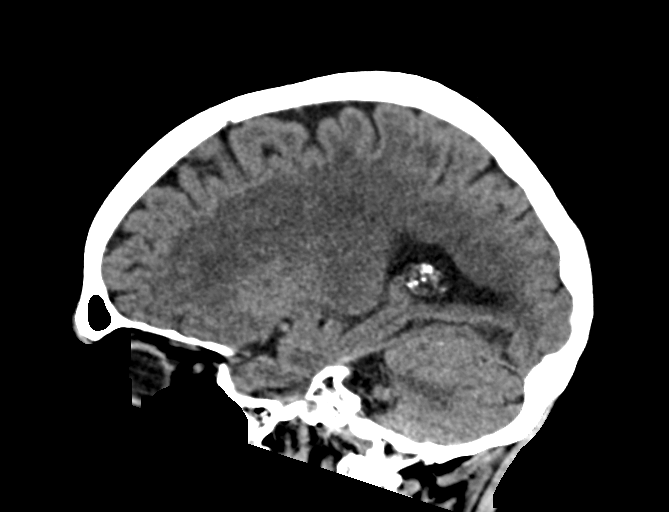

[16 of 47 positions shown; findings below may reference images not displayed]

FINDINGS: Brain: No new hypodensity to suggest acute infarct. Apparent
increased density in the gyri in the posterior right temporal lobe
(series 3, image 17) and right parietal lobe (series 3, image 20),
which is favored to be artifactual. No parenchymal hemorrhage, mass,
mass effect, or midline shift. Periventricular white matter changes,
likely the sequela of chronic small vessel ischemic disease.

Vascular: No hyperdense vessel. Atherosclerotic calcifications in
the intracranial carotid and vertebral arteries.

Skull: Normal. Negative for fracture or focal lesion.

Sinuses/Orbits: No acute finding. Status post bilateral lens
replacements.

Other: Fluid in the bilateral mastoid air cells.

ASPECTS (Alberta Stroke Program Early CT Score)

- Ganglionic level infarction (caudate, lentiform nuclei, internal
capsule, insula, M1-M3 cortex): 7

- Supraganglionic infarction (M4-M6 cortex): 3

Total score (0-10 with 10 being normal): 10
IMPRESSION: 1. No acute intracranial process.
2. ASPECTS is 10

Code stroke imaging results were communicated on [DATE] at [DATE] to provider TOMOKO via telephone, who verbally acknowledged these
results.

## 2021-11-07 MED ORDER — ACETAMINOPHEN 160 MG/5ML PO SOLN
650.0000 mg | ORAL | Status: DC | PRN
  Filled 2021-11-07: qty 20.3

## 2021-11-07 MED ORDER — STROKE: EARLY STAGES OF RECOVERY BOOK: Freq: Once | Status: AC

## 2021-11-07 MED ORDER — ACETAMINOPHEN 650 MG RE SUPP: 650.0000 mg | RECTAL | Status: DC | PRN

## 2021-11-07 MED ORDER — ACETAMINOPHEN 325 MG PO TABS
650.0000 mg | ORAL_TABLET | ORAL | Status: DC | PRN
  Administered 2021-11-08 (×2): 650 mg via ORAL
  Filled 2021-11-07 (×2): qty 2

## 2021-11-07 MED ORDER — SODIUM CHLORIDE 0.9 % IV SOLN: INTRAVENOUS | Status: DC

## 2021-11-07 NOTE — Assessment & Plan Note (Addendum)
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

## 2021-11-07 NOTE — Assessment & Plan Note (Signed)
Continue levothyroxine 

## 2021-11-07 NOTE — Assessment & Plan Note (Addendum)
Holding Xarelto per neurology  ?Holding metoprolol to allow for permissive hypertension ?

## 2021-11-07 NOTE — ED Notes (Signed)
Activated Code Stroke per Dr. Fuller Plan ?

## 2021-11-07 NOTE — Progress Notes (Signed)
Chaplain responded to Code stroke. Pt. Unavailable at this time. No further needs. Please contact Chaplain if needed.  ?

## 2021-11-07 NOTE — Assessment & Plan Note (Addendum)
Chronic and at baseline. ?

## 2021-11-07 NOTE — H&P (Signed)
?History and Physical  ? ? ?Patient: Kaylee Santiago DOB: 05-05-1935 ?DOA: 11/07/2021 ?DOS: the patient was seen and examined on 11/07/2021 ?PCP: Kaylee Ferrier, MD  ?Patient coming from: SNF ? ?Chief Complaint:  ?Chief Complaint  ?Patient presents with  ? Weakness  ? ? ?HPI: Kaylee Santiago is a 87 y.o. 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  A code stroke was called from the ED and initial CT head showed no acute intracranial process.  A teleneurology consult was done from the ED.  She was not a thrombolytic candidate due to being on Xarelto and a recent stroke and was not thought to be a thrombectomy candidate as she did not have an LVO syndrome.  CTA head and neck was not thought necessary by teleneurology and MRI/MRA head and neck were ordered with recommendation to hold anticoagulation pending results of MRI/MRA.  Hospitalist is consulted for admission ?Additional data review: BP 153/80 in the ED with otherwise normal vitals.  Blood work significant for hyponatremia of 129 but otherwise unremarkable EKG, personally viewed and interpreted showing A-fib at 79 with nonspecific ST-T wave changes. ?Discharge summary from recent hospitalization as well as neurology notes reviewed.  ? ?Review of Systems: As mentioned in the history of present illness. All other systems reviewed and are negative. ?Past Medical History:  ?Diagnosis Date  ? Atrial fibrillation (HCC)   ? Atrial fibrillation (HCC)   ? Back pain   ? Dysrhythmia   ? Hypertension   ? Hypothyroidism   ? Stroke [facility])   ? ?Past Surgical History:  ?Procedure Laterality Date  ? ABDOMINAL  HYSTERECTOMY    ? BREAST EXCISIONAL BIOPSY Right 70's  ? CATARACT EXTRACTION W/ INTRAOCULAR LENS  IMPLANT, BILATERAL    ? KYPHOPLASTY N/A 12/21/2018  ? Procedure: KYPHOPLASTY L1;  Surgeon: Kennedy Bucker, MD;  Location: [facility] ORS;  Service: Orthopedics;  Laterality: N/A;  ? ?Social History:  reports that she has quit smoking. Her smoking use included cigarettes. She has never used smokeless tobacco. She reports that she does not currently use alcohol. She reports that she does not use drugs. ? ?Allergies  ?Allergen Reactions  ? Amoxicillin-Pot Clavulanate Diarrhea  ? Levofloxacin Diarrhea  ? Trazodone Other (See Comments)  ?  nightmares  ? ? ?Family History  ?Problem Relation Age of Onset  ? Breast cancer Sister 75  ? Breast cancer Sister   ?     54's  ? CAD Mother   ? ? ?Prior to Admission medications   ?Medication Sig Start Date End Date Taking? Authorizing Provider  ?Biotin 5000 MCG TABS Take 5,000 mcg by mouth daily.    [provider]  ?Calcium Carb-Cholecalciferol (CALTRATE 600+D3 PO) Take 1 tablet by mouth daily.    [provider]  ?levothyroxine (SYNTHROID) 75 MCG tablet Take 1 tablet by mouth daily. 03/03/21   [provider]  ?lisinopril (ZESTRIL) 10 MG tablet Take 1 tablet (10 mg total) by mouth daily. 10/11/21   Alford Highland, MD  ?loratadine (CLARITIN) 10 MG tablet Take 10 mg by mouth daily. 07/09/21   [provider]  ?LORazepam (ATIVAN) 0.5 MG  tablet Take 0.5 mg by mouth at bedtime as needed for sleep.    [provider]  ?metoprolol succinate (TOPROL-XL) 50 MG 24 hr tablet Take 50 mg by mouth in the morning and at bedtime. 12/26/17   [provider]  ?Multiple Vitamin (MULTI-VITAMIN DAILY PO) Take 1 tablet by mouth daily.    [provider]  ?Saccharomyces boulardii (PROBIOTIC) 250 MG CAPS Take 1 capsule by mouth daily.    [provider]  ?sodium chloride 1 g tablet Take 1 tablet (1 g total) by mouth 2 (two) times daily with a  meal. 10/10/21   Alford Highland, MD  ?vitamin E 400 UNIT capsule Take 400 Units by mouth daily.    [provider]  ?XARELTO 20 MG TABS tablet Take 20 mg by mouth daily. 05/12/21   [provider]  ? ? ?Physical Exam: ?Vitals:  ? 11/07/21 2118 11/07/21 2130 11/07/21 2200 11/07/21 2230  ?BP: (!) 162/95 (!) 152/95 140/64 132/75  ?Pulse: 78 76 73 76  ?Resp: 20 20 17 13   ?Temp:      ?TempSrc:      ?SpO2: 98% 98% 97% 95%  ?Weight:      ?Height:      ? ?Physical Exam ?Vitals and nursing note reviewed.  ?Constitutional:   ?   General: She is not in acute distress. ?HENT:  ?   Head: Normocephalic and atraumatic.  ?Cardiovascular:  ?   Rate and Rhythm: Normal rate and regular rhythm.  ?   Pulses: Normal pulses.  ?   Heart sounds: Normal heart sounds.  ?Pulmonary:  ?   Effort: Pulmonary effort is normal.  ?   Breath sounds: Normal breath sounds.  ?Abdominal:  ?   Palpations: Abdomen is soft.  ?   Tenderness: There is no abdominal tenderness.  ?   Comments: PEG tube  ?Neurological:  ?   Comments: Dysarthria, right upper extremity weakness 4-5 and 3 out of 5 weakness on the left  ? ? ? ?Data Reviewed: ?Relevant notes from primary care and specialist visits, past discharge summaries as available in EHR, including Care Everywhere. ?Prior diagnostic testing as pertinent to current admission diagnoses ?Updated medications and problem lists for reconciliation ?ED course, including vitals, labs, imaging, treatment and response to treatment ?Triage notes, nursing and pharmacy notes and ED provider's notes ?Notable results as noted in HPI ? ? ?Assessment and Plan: ?* Acute CVA (cerebrovascular accident) (HCC) ?Recommendations: ?  ?      Stroke/Telemetry Floor ?      Neuro Checks ?      Bedside Swallow Eval ?      DVT Prophylaxis ?      IV Fluids, Normal Saline ?      Head of Bed 30 Degrees ?      Euglycemia and Avoid Hyperthermia (PRN Acetaminophen) ?      Antihypertensives PRN if Blood pressure is greater than 220/120  or there is a concern for End organ damage/contraindications for permissive HTN. If blood pressure is greater than 220/120 give labetalol PO or IV or Vasotec IV with a goal of 15% reduction in BP during the first 24 hours. ?      Ideally hold AC until MRI clarifies the picture ? ?Follow-up MRI/MRA ?Neurology consult to follow ? ?Chronic anticoagulation ?Xarelto on hold as above ? ?AF (paroxysmal atrial fibrillation) (HCC) ?Holding Xarelto per neurology recommendations pending MRI/MRA ?Holding metoprolol to allow for permissive hypertension ? ?Hyponatremia ?Sodium 129.  Hydration  with normal saline and monitor ? ?Hypertension ?BP 132/75 so holding all antihypertensives to allow for permissive hypertension ? ?Hypothyroidism ?Continue levothyroxine ? ?Dysarthria as late effect of cerebrovascular disease ?Continue speech therapy ? ?Dysphagia due to recent cerebrovascular accident ?Speech therapy eval ? ?Hemiparesis affecting left side as late effect of cerebrovascular accident (CVA) (HCC) ?Increase nursing assistance for transfers ?Fall precautions ? ?S/P percutaneous endoscopic gastrostomy (PEG) tube placement (HCC) ?Secondary to dysarthria from recent CVA ?Speech therapy eval and we will keep n.p.o. ?Osmolite tube feeds ? ? ? ? ? ? ?Advance Care Planning:   Code Status: Prior  ? ?Consults: Neurology, Dr. Selina CooleyStack ? ?Family Communication: Niece at bedside.  Explained plan of care to patient and niece and they are in agreement ? ?Severity of Illness: ?The appropriate patient status for this patient is INPATIENT. Inpatient status is judged to be reasonable and necessary in order to provide the required intensity of service to ensure the patient's safety. The patient's presenting symptoms, physical exam findings, and initial radiographic and laboratory data in the context of their chronic comorbidities is felt to place them at high risk for further clinical deterioration. Furthermore, it is not anticipated that the patient  will be medically stable for discharge from the hospital within 2 midnights of admission.  ? ?* I certify that at the point of admission it is my clinical judgment that the patient will require inpatient hospital c

## 2021-11-07 NOTE — Assessment & Plan Note (Signed)
Speech therapy eval ?

## 2021-11-07 NOTE — Progress Notes (Signed)
Code Stroke Activated @2056 . LKWT 1730. Patient with worsening left arm weakness, as well as right arm weakness, and trouble with word finding. mRS 3. Patient left for CT @2105  and returned @2116 . Dr. Rockney Ghee on stroke cart @2116 . CT head results given to Dr. Rockney Ghee on camera @2125 .  ?

## 2021-11-07 NOTE — Assessment & Plan Note (Signed)
Increase nursing assistance for transfers ?Fall precautions ?

## 2021-11-07 NOTE — Assessment & Plan Note (Signed)
Xarelto on hold as above ?

## 2021-11-07 NOTE — Assessment & Plan Note (Signed)
BP 132/75 so holding all antihypertensives to allow for permissive hypertension ?

## 2021-11-07 NOTE — ED Provider Notes (Signed)
? ?[facility] ?Provider Note ? ? ? Event Date/Time  ? First MD Initiated Contact with Patient 11/07/21 2045   ?  (approximate) ? ? ?History  ? ?Weakness ? ? ?HPI ? ?Kaylee Santiago is a 87 y.o. female with recent prior stroke who comes in with concerns for new right-sided weakness and difficulty speaking.  Last known normal was around 6 to 7 PM.  She had sudden onset of difficulties moving the right side.  Her prior stroke affected the left side.  She denies any chest pain, shortness of breath or any other concerns. ? ?With [facility] who comes in with increasing weakness at 736.  ? ?Patient was admitted on 10/23/2021 with right-sided deficits from stroke.  I reviewed the records from [facility] where patient had right MCA stroke.  Patient is on Xarelto 20 mg daily ? ?Physical Exam  ? ?Triage Vital Signs: ?ED Triage Vitals  ?Enc Vitals Group  ?   BP 11/07/21 2030 (!) 153/80  ?   Pulse Rate 11/07/21 2029 82  ?   Resp 11/07/21 2029 20  ?   Temp 11/07/21 2044 98.4 ?F (36.9 ?C)  ?   Temp Source 11/07/21 2044 Oral  ?   SpO2 11/07/21 2029 97 %  ?   Weight 11/07/21 2032 106 lb (48.1 kg)  ?   Height 11/07/21 2032 5\' 10"  (1.778 m)  ?   Head Circumference --   ?   [facility] --   ?   Pain Score 11/07/21 2031 0  ?   Pain Loc --   ?   Pain Edu? --   ?   Excl. in GC? --   ? ? ?Most recent vital signs: ?Vitals:  ? 11/07/21 2030 11/07/21 2044  ?BP: (!) 153/80   ?Pulse: 83   ?Resp: 18   ?Temp:  98.4 ?F (36.9 ?C)  ?SpO2: 97%   ? ? ? ?General: Awake, no distress.  ?CV:  Good peripheral perfusion.  ?Resp:  Normal effort.  ?Abd:  No distention.  ?Other:  Patient is a little bit of weakness on both sides.  Seems a little bit weaker on the right per her baseline.  She got facial droop on the left which is at baseline.  Her speech sounds pretty good but may be slightly slurred which seems similar to her prior stroke ? ? ?ED Results / Procedures / Treatments  ? ?Labs ?(all labs ordered are listed, but only abnormal results  are displayed) ?Labs Reviewed  ?CBC - Abnormal; Notable for the following components:  ?    Result Value  ? WBC 10.9 (*)   ? All other components within normal limits  ?DIFFERENTIAL - Abnormal; Notable for the following components:  ? Neutro Abs 9.4 (*)   ? All other components within normal limits  ?COMPREHENSIVE METABOLIC PANEL - Abnormal; Notable for the following components:  ? Sodium 129 (*)   ? Chloride 95 (*)   ? Glucose, Bld 103 (*)   ? Calcium 8.8 (*)   ? Albumin 3.4 (*)   ? All other components within normal limits  ?CBG MONITORING, ED - Abnormal; Notable for the following components:  ? Glucose-Capillary 102 (*)   ? All other components within normal limits  ?RESP PANEL BY RT-PCR (FLU A&B, COVID) [id number]  ?ETHANOL  ?PROTIME-INR  ?APTT  ?URINE DRUG SCREEN, QUALITATIVE ([facility] ONLY)  ?URINALYSIS, ROUTINE W REFLEX MICROSCOPIC  ? ? ? ?EKG ? ?My interpretation of EKG: ? ?  Atrial fibrillation with a 79 without any ST elevation or T wave inversions, normal intervals ? ?RADIOLOGY ?I have reviewed the CT head personally without evidence of intracranial hemorrhage ? ?PROCEDURES: ? ?Critical Care performed: Yes, see critical care procedure note(s) ? ?.1-3 Lead EKG Interpretation ?Performed by: Concha Se, MD ?Authorized by: Concha Se, MD  ? ?  Interpretation: abnormal   ?  ECG rate:  70 ?  ECG rate assessment: tachycardic   ?  Rhythm: atrial fibrillation   ?  Ectopy: none   ?  Conduction: normal   ?.Critical Care ?Performed by: Concha Se, MD ?Authorized by: Concha Se, MD  ? ?Critical care provider statement:  ?  Critical care time (minutes):  30 ?  Critical care was necessary to treat or prevent imminent or life-threatening deterioration of the following conditions:  CNS failure or compromise ?  Critical care was time spent personally by me on the following activities:  Development of treatment plan with patient or surrogate, discussions with consultants, evaluation of patient's response to treatment,  examination of patient, ordering and review of laboratory studies, ordering and review of radiographic studies, ordering and performing treatments and interventions, pulse oximetry, re-evaluation of patient's condition and review of old charts ? ? ?MEDICATIONS ORDERED IN ED: ?Medications - No data to display ? ? ?IMPRESSION / MDM / ASSESSMENT AND PLAN / ED COURSE  ?I reviewed the triage vital signs and the nursing notes. ? ?Patient comes in with concern for new right-sided weakness in the setting of recent left-sided stroke.  Stroke code called given symptom onset.  Patient not TNK candidate due to patient being on a blood thinner and recent stroke.  Please see their note for more information.  They do not feel like it needed the CTA given not consistent with LVO. ? ?EtOH slightly elevated at 102.  INR negative.  CBC slightly elevated.  Sodium slightly low but similar to prior ? ?Discussed with neurology who recommended MRI brain MRA neck with and without MRI head without holding the Xarelto till after the MRI.  They recommend admission for stroke work ? ? ?The patient is on the cardiac monitor to evaluate for evidence of arrhythmia and/or significant heart rate changes. ? ?  ? ? ?FINAL CLINICAL IMPRESSION(S) / ED DIAGNOSES  ? ?Final diagnoses:  ?Weakness  ?Cerebrovascular accident (CVA), unspecified mechanism (HCC)  ? ? ? ?Rx / DC Orders  ? ?ED Discharge Orders   ? ? None  ? ?  ? ? ? ?Note:  This document was prepared using Dragon voice recognition software and may include unintentional dictation errors. ?  ?Concha Se, MD ?11/07/21 2256 ? ?

## 2021-11-07 NOTE — ED Notes (Signed)
Neuro cart at bedside. Stroke nurse assessing pt. ?

## 2021-11-07 NOTE — ED Notes (Signed)
Patient currently in CT with RN ?

## 2021-11-07 NOTE — Assessment & Plan Note (Signed)
Continue speech therapy

## 2021-11-07 NOTE — Consult Note (Signed)
[facility] ?[facility] Tele[facility] Consult [facility] ? ? ?Patient Name:   Kaylee Santiago, Kaylee Santiago ?Date of Birth:   1934/07/18 ?Identification Number:   MRN - [id number] ?Date of Service:   11/07/2021 21:10:44 ? ?Diagnosis: ?      R47.01 - Aphasia ?      R53.1 - Weakness ? ?Impression: ?     87 yo LH F with h/o recent strokes 10/23/21 with residual aphasia and L sided weakness, afib on Xarelto, HTN, hypothyroidism, presenting with new R sided weakness and worsened difficulty speaking with difficulty getting words out and slurred speech. Not a thrombolytic candidate due to being on Xarelto and recent strokes. Not a thrombectomy candidate with no LVO syndrome. Recommend stroke workup. Discussed with ED Dr. Jari Pigg by phone and family on video at bedside. ? ?Our recommendations are outlined below. ? ?Recommendations: ? ?      Stroke/Telemetry Floor ?      Neuro Checks ?      Bedside Swallow Eval ?      DVT Prophylaxis ?      IV Fluids, Normal Saline ?      Head of Bed 30 Degrees ?      Euglycemia and Avoid Hyperthermia (PRN Acetaminophen) ?      Antihypertensives PRN if Blood pressure is greater than 220/120 or there is a concern for End organ damage/contraindications for permissive HTN. If blood pressure is greater than 220/120 give labetalol PO or IV or Vasotec IV with a goal of 15% reduction in BP during the first 24 hours. ?      Ideally hold AC until MRI clarifies the picture ? ?Per facility request will defer further work up, management, and referrals to inpatient service, inclusive of inpatient neurology consult. ? ?Sign Out: ?      Discussed with Emergency Department Provider ? ? ? ?------------------------------------------------------------------------------ ? ?Advanced Imaging: ?Advanced Imaging Deferred because: ? ?Non-disabling symptoms as verified by the patient; no cortical signs so not consistent with LVO ? ? ?Metrics: ?Last Known Well: 11/07/2021 17:30:00 ?[facility] Notification Time: 11/07/2021  21:10:44 ?Arrival Time: 11/07/2021 20:14:00 ?Stamp Time: 11/07/2021 21:10:44 ?Initial Response Time: 11/07/2021 21:16:00 ?Symptoms: R sided weakness, difficulty speaking. ?NIHSS Start Assessment Time: 11/07/2021 21:18:48 ?Patient is not a candidate for Thrombolytic. ?Thrombolytic Medical Decision: 11/07/2021 21:26:07 ?Patient was not deemed candidate for Thrombolytic because of following reasons: ?Use of NOAs within 48 hours. ?Stroke in last 3 months. ? ?CT head showed no acute hemorrhage or acute core infarct. ?I personally Reviewed the CT Head and it Showed No acute intracranial process ? ?Primary Provider Notified of Diagnostic Impression and Management Plan on: 11/07/2021 21:45:02 ? ? ? ?------------------------------------------------------------------------------ ? ?History of Present Illness: ?Patient is a 87 year old Female. ? ?Patient was brought by EMS for symptoms of R sided weakness, difficulty speaking. ?87 yo LH F with h/o recent strokes 10/23/21, afib on Xarelto, HTN, hypothyroidism, presenting with R sided weakness and worsened difficulty speaking. ? ?She had recent multifocal mostly R MCA area strokes on 10/23/21. At that time she had aphasia and L arm weakness. She was able to walk. Since then she's improved a little but is not back to where she was. ? ?Today at 12:30 family was visiting and helped her to the bath. She was able to walk well in the bathroom. She held both arms up over her head. ? ?Then at 1730 she felt a change. Her heart was racing. She felt exhausted. Her R arm couldn't move as well and was numb and  tingly. It was harder for her to form words and the words were also more slurred. Family agrees that now she's not forming her words as well and having trouble getting them out. ? ?No missed doses of Xarelto. ?  ? ?Past Medical History: ?     Hypertension ?     Atrial Fibrillation ?     Stroke ? ?Medications: ? ?Anticoagulant use:  Yes Xarelto ?No Antiplatelet use ?Reviewed EMR for  current medications ? ?Allergies:  ?Reviewed ? ?Social History: ?Smoking: No ? ?Family History: ? ?There is no family history of premature cerebrovascular disease pertinent to this consultation ? ?ROS : ?14 Points Review of Systems was performed and was negative except mentioned in HPI. ? ?Past Surgical History: ?There Is No Surgical History Contributory To Today?s Visit ? ?  ? ?Examination: ?BP(148/85), Pulse(80), Blood Glucose(102) ?1A: Level of Consciousness - Alert; keenly responsive + 0 ?1B: Ask Month and Age - Both Questions Right + 0 ?1C: Blink Eyes & Squeeze Hands - Performs Both Tasks + 0 ?2: Test Horizontal Extraocular Movements - Normal + 0 ?3: Test Visual Fields - No Visual Loss + 0 ?4: Test Facial Palsy (Use Grimace if Obtunded) - Minor paralysis (flat nasolabial fold, smile asymmetry) + 1 ?5A: Test Left Arm Motor Drift - Drift, but doesn't hit bed + 1 ?5B: Test Right Arm Motor Drift - No Drift for 10 Seconds + 0 ?6A: Test Left Leg Motor Drift - Drift, hits bed + 2 ?6B: Test Right Leg Motor Drift - No Drift for 5 Seconds + 0 ?7: Test Limb Ataxia (FNF/Heel-Shin) - No Ataxia + 0 ?8: Test Sensation - Mild-Moderate Loss: Less Sharp/More Dull + 1 ?9: Test Language/Aphasia - Mild-Moderate Aphasia: Some Obvious Changes, Without Significant Limitation + 1 ?10: Test Dysarthria - Severe Dysarthria: Unintelligble Slurring or Out of Proportion to Aphasia + 2 ?11: Test Extinction/Inattention - No abnormality + 0 ? ?NIHSS Score: 8 ? ?NIHSS Free Text : ? L hand can't extend fingers, weak grip ? Sensation different L hand and possibly barely L face ? ?Pre-Morbid Modified Rankin Scale: ?2 Points = Slight disability; unable to carry out all previous activities, but able to look after own affairs without assistance ? ? ?Patient/Family was informed the Neurology Consult would occur via TeleHealth consult by way of interactive audio and video telecommunications and consented to receiving care in this manner. ? ? ?Patient is  being evaluated for possible acute neurologic impairment and high probability of imminent or life-threatening deterioration. I spent total of 30 minutes providing care to this patient, including time for face to face visit via telemedicine, review of medical records, imaging studies and discussion of findings with providers, the patient and/or family. ? ? ?Dr Raymond Gurney ? ? ?[facility] ?[phone] ? ? ?Case [id number] ?  ?

## 2021-11-07 NOTE — Assessment & Plan Note (Addendum)
--  MRI showed Ill-defined restricted diffusion in the right frontoparietal region and occipital cortex are favored to represent acute and/or subacute infarcts. ?Plan: ?--neuro consult today ?--Hold BP for permissive HTN ?--hold Xarelto for now ?--cont on tele ?--PT/OT/SLP ? ?

## 2021-11-07 NOTE — ED Triage Notes (Addendum)
Pt arrives via [facility] from [facility] with CC of increased weakness that began today at 1730. Pt has L sided deficits from stroke on 10/23/21 (just discharged from [facility] on 11/03/21) but reports feeling increased weakness in L and R arms today. ?Pt reports dysphasia ?

## 2021-11-08 ENCOUNTER — Inpatient Hospital Stay: Payer: Medicare Other

## 2021-11-08 DIAGNOSIS — I480 Paroxysmal atrial fibrillation: Secondary | ICD-10-CM | POA: Diagnosis not present

## 2021-11-08 DIAGNOSIS — I639 Cerebral infarction, unspecified: Secondary | ICD-10-CM

## 2021-11-08 LAB — URINALYSIS, ROUTINE W REFLEX MICROSCOPIC
Bilirubin Urine: NEGATIVE
Glucose, UA: NEGATIVE mg/dL
Hgb urine dipstick: NEGATIVE
Ketones, ur: NEGATIVE mg/dL
Nitrite: NEGATIVE
Protein, ur: NEGATIVE mg/dL
Specific Gravity, Urine: 1.009 (ref 1.005–1.030)
pH: 8 (ref 5.0–8.0)

## 2021-11-08 LAB — LIPID PANEL
Cholesterol: 139 mg/dL (ref 0–200)
HDL: 74 mg/dL
LDL Cholesterol: 52 mg/dL (ref 0–99)
Total CHOL/HDL Ratio: 1.9 ratio
Triglycerides: 65 mg/dL
VLDL: 13 mg/dL (ref 0–40)

## 2021-11-08 LAB — URINE DRUG SCREEN, QUALITATIVE (ARMC ONLY)
Amphetamines, Ur Screen: NOT DETECTED
Barbiturates, Ur Screen: NOT DETECTED
Benzodiazepine, Ur Scrn: NOT DETECTED
Cannabinoid 50 Ng, Ur ~~LOC~~: NOT DETECTED
Cocaine Metabolite,Ur ~~LOC~~: NOT DETECTED
MDMA (Ecstasy)Ur Screen: NOT DETECTED
Methadone Scn, Ur: NOT DETECTED
Opiate, Ur Screen: NOT DETECTED
Phencyclidine (PCP) Ur S: NOT DETECTED
Tricyclic, Ur Screen: NOT DETECTED

## 2021-11-08 LAB — HEMOGLOBIN A1C
Hgb A1c MFr Bld: 6.1 % — ABNORMAL HIGH (ref 4.8–5.6)
Mean Plasma Glucose: 128.37 mg/dL

## 2021-11-08 LAB — GLUCOSE, CAPILLARY: Glucose-Capillary: 103 mg/dL — ABNORMAL HIGH (ref 70–99)

## 2021-11-08 IMAGING — MR MR MRA HEAD W/O CM
1 series · 24 of 48 positions shown · IV contrast (gadavist)
Comparison: [DATE] MRI head

CLINICAL DATA: Increased weakness, prior left-sided deficits

EXAM:
MRI HEAD WITHOUT CONTRAST
MRA HEAD WITHOUT CONTRAST
MRA NECK WITHOUT AND WITH CONTRAST
TECHNIQUE: Multiplanar, multi-echo pulse sequences of the brain and surrounding
structures were acquired without intravenous contrast. Angiographic
images of the Circle of Willis were acquired using MRA technique
without intravenous contrast. Angiographic images of the neck were
acquired using MRA technique without and with intravenous contrast.
Carotid stenosis measurements (when applicable) are obtained
utilizing NASCET criteria, using the distal internal carotid
diameter as the denominator.
CONTRAST:  5mL GADAVIST GADOBUTROL 1 MMOL/ML IV SOLN

[Series 1: TOF · axial · 0.5mm · 0.48mm/px · z∈[-110,-16]mm · 24 of 217 slices shown]
[im 1/217]
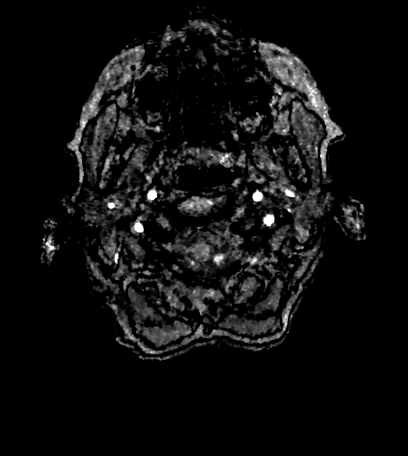
[im 5/217]
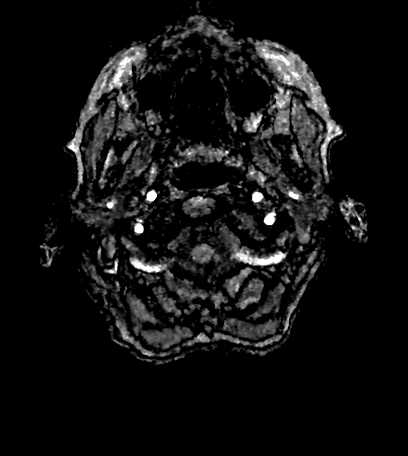
[im 10/217]
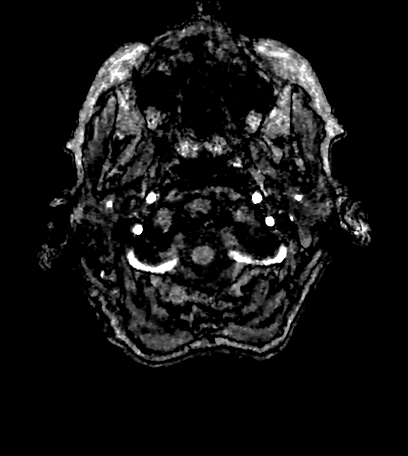
[im 14/217]
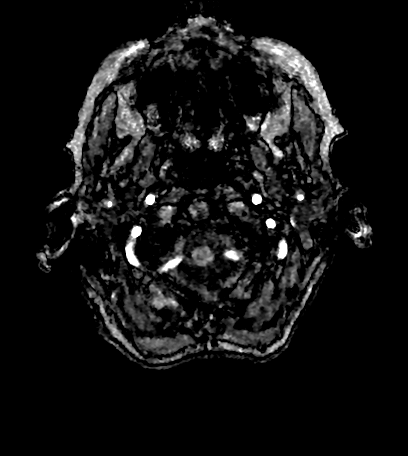
[im 19/217]
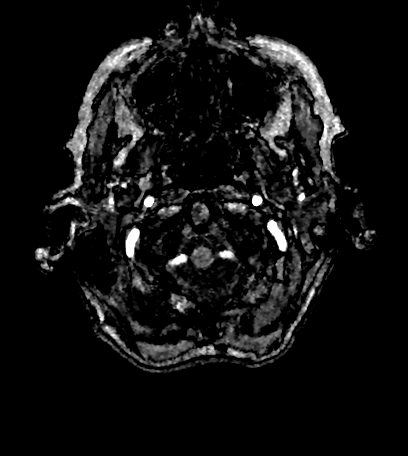
[im 23/217]
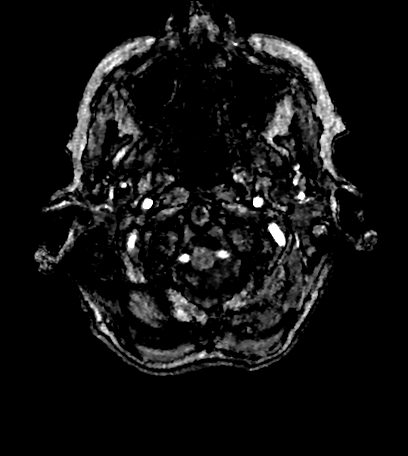
[im 28/217]
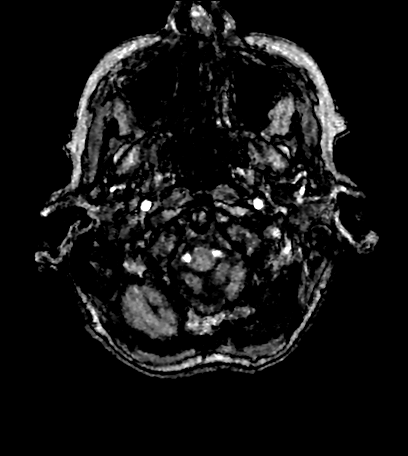
[im 33/217]
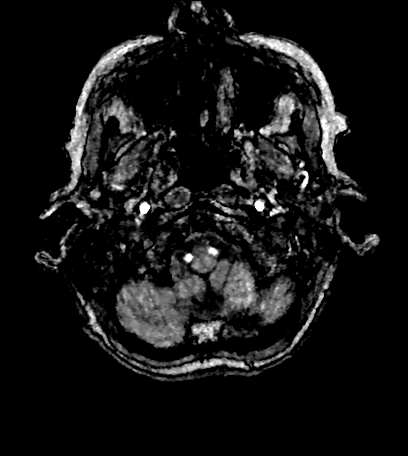
[im 37/217]
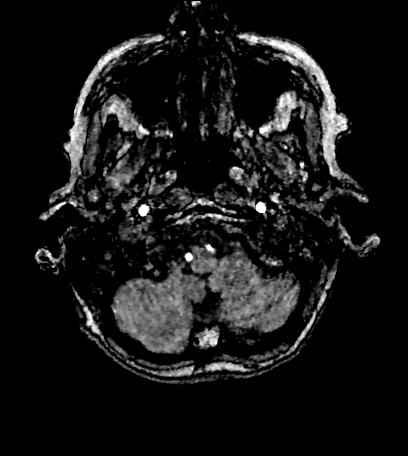
[im 42/217]
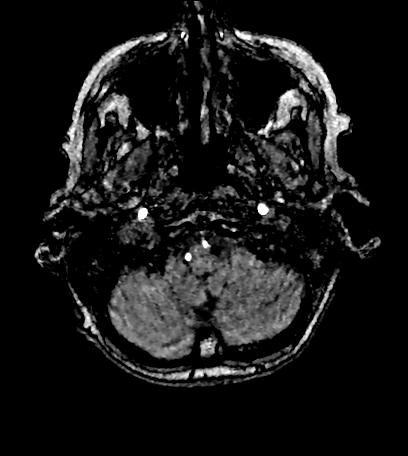
[im 46/217]
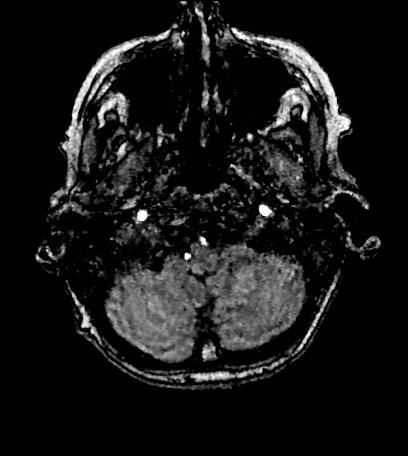
[im 51/217]
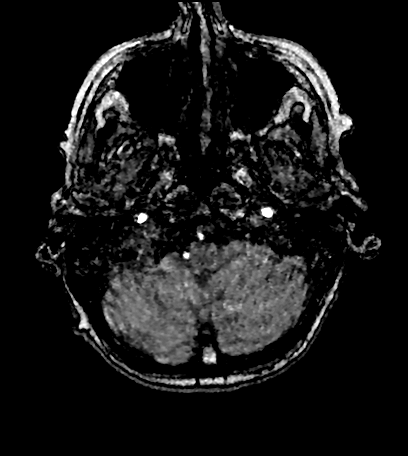
[im 56/217]
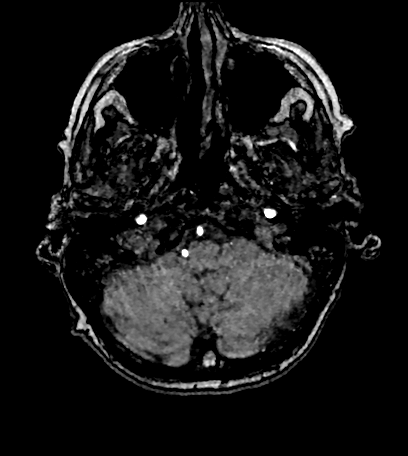
[im 60/217]
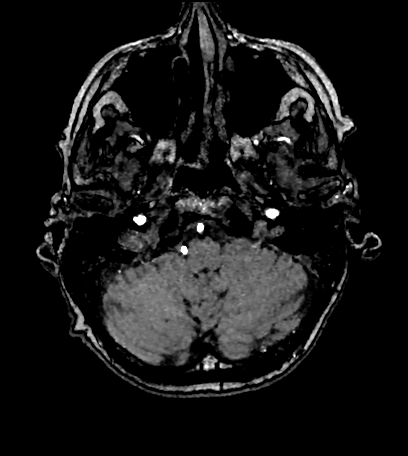
[im 65/217]
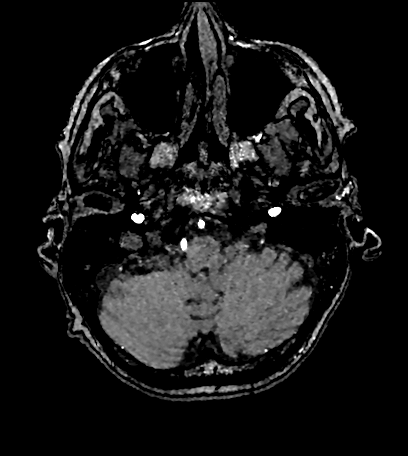
[im 69/217]
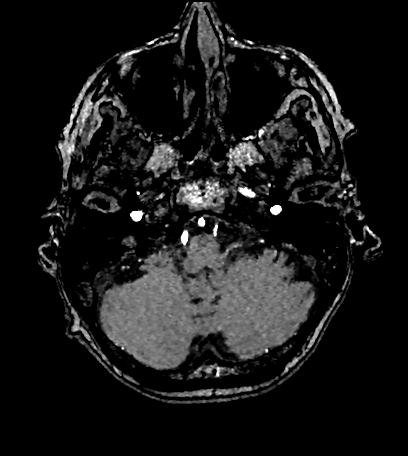
[im 74/217]
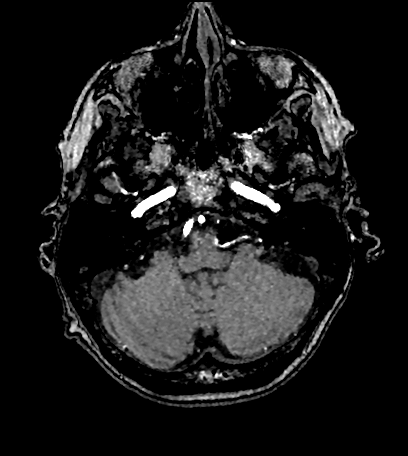
[im 97/217]
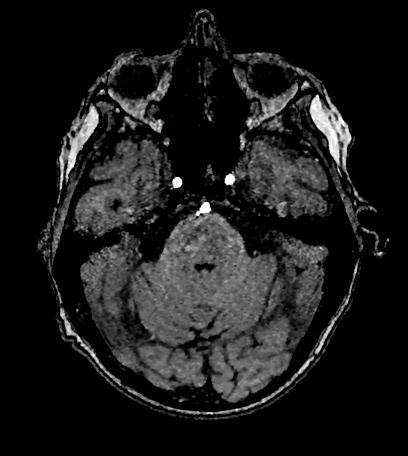
[im 111/217]
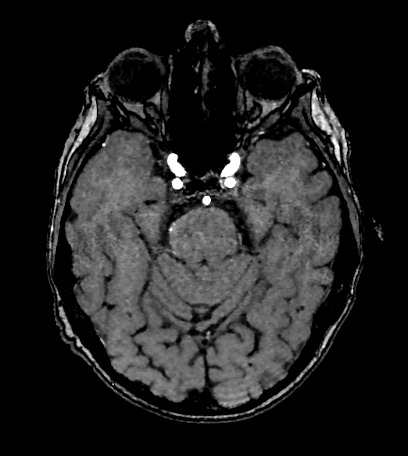
[im 125/217]
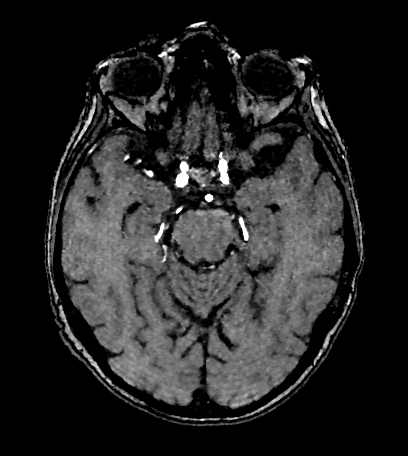
[im 152/217]
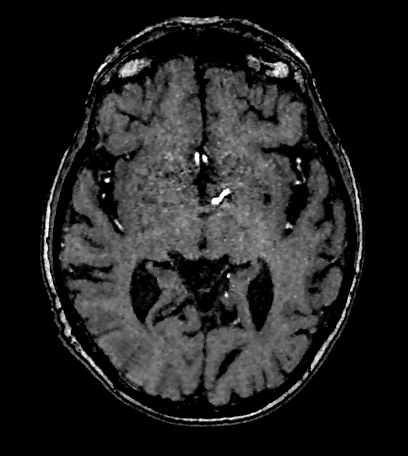
[im 180/217]
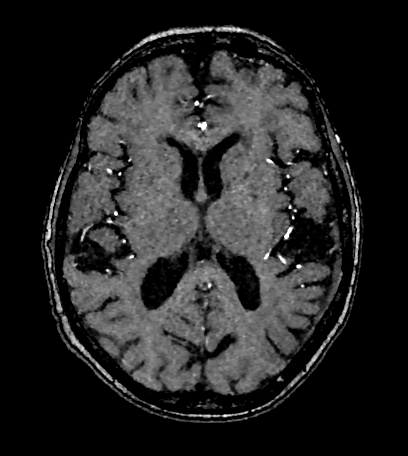
[im 184/217]
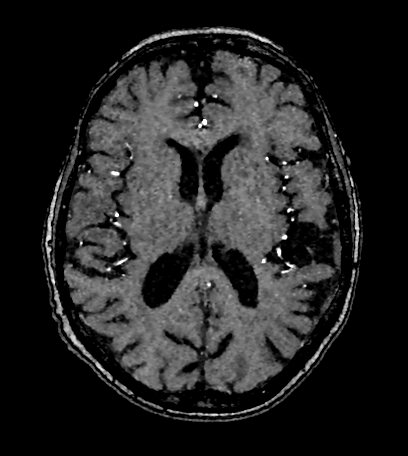
[im 207/217]
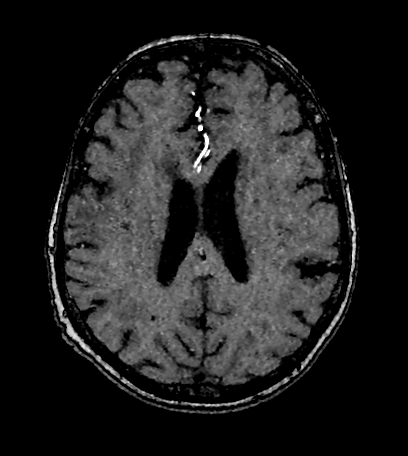

[24 of 48 positions shown; findings below may reference images not displayed]

FINDINGS: MRI HEAD FINDINGS

Brain: Faintly increased signal on diffusion-weighted imaging in the
right frontoparietal lobe (series 5, images 31-35) and occipital
cortex (series 5, images 27-28), with ADC correlates. These areas
are associated with increased T2 signal, with additional T2 signal
and gyral swelling in the right frontoparietal cortex (series 15,
image 36) and left temporal occipital area (series 15, image 21),
also likely related to the patient's recent acute infarcts.

No acute hemorrhage, mass, mass effect, or midline shift. No
hydrocephalus or extra-axial collection. No hemosiderin deposition
to suggest remote hemorrhage.

Vascular: Please see MRA findings below.

Skull and upper cervical spine: Normal marrow signal.

Sinuses/Orbits: Clear paranasal sinuses. Status post bilateral lens
replacements.

Other: Fluid throughout the mastoid air cells.

MRA HEAD FINDINGS

Anterior circulation: Both internal carotid arteries are patent to
the termini, without significant stenosis.

A1 segments patent. Normal anterior communicating artery. Anterior
cerebral arteries are patent to their distal aspects.

No M1 stenosis or occlusion. Normal MCA bifurcations. Distal MCA
branches perfused and symmetric.

Posterior circulation: Vertebral arteries patent to the
vertebrobasilar junction without stenosis. Posterior inferior
cerebral arteries patent bilaterally.

Basilar patent to its distal aspect. Superior cerebellar arteries
patent bilaterally.

Patent P1 segments. PCAs perfused to their distal aspects without
stenosis. The bilateral posterior communicating arteries are not
visualized.

Anatomic variants: None significant

MRA NECK FINDINGS

Aortic arch: Two-vessel arch with a common origin of the
brachiocephalic and left common carotid arteries. Imaged portion
shows no evidence of aneurysm or dissection. No significant stenosis
of the major arch vessel origins.

Right carotid system: No evidence of dissection, occlusion, or
hemodynamically significant stenosis (greater than 50%).

Left carotid system: No evidence of dissection, occlusion, or
hemodynamically significant stenosis (greater than 50%).

Vertebral arteries: No evidence of dissection, occlusion, or
hemodynamically significant stenosis (greater than 50%).

Other: None
IMPRESSION: 1. Ill-defined restricted diffusion in the right frontoparietal
region and occipital cortex are favored to represent acute and/or
subacute infarcts.
2. Sequela of recent acute infarcts in the right frontoparietal and
temporal occipital regions.
3. No intracranial large vessel occlusion or significant stenosis.
4. No hemodynamically significant stenosis in the neck.

These results were called by telephone at the time of interpretation
on [DATE] at [DATE] to provider BONET NP, who verbally
acknowledged these results.

## 2021-11-08 IMAGING — MR MR MRA NECK WO/W CM
2 of 3 series · 32 of 48 positions shown · IV contrast (5ml Gadavist)
Comparison: [DATE] MRI head

CLINICAL DATA: Increased weakness, prior left-sided deficits

EXAM:
MRI HEAD WITHOUT CONTRAST
MRA HEAD WITHOUT CONTRAST
MRA NECK WITHOUT AND WITH CONTRAST
TECHNIQUE: Multiplanar, multi-echo pulse sequences of the brain and surrounding
structures were acquired without intravenous contrast. Angiographic
images of the Circle of Willis were acquired using MRA technique
without intravenous contrast. Angiographic images of the neck were
acquired using MRA technique without and with intravenous contrast.
Carotid stenosis measurements (when applicable) are obtained
utilizing NASCET criteria, using the distal internal carotid
diameter as the denominator.
CONTRAST:  5mL GADAVIST GADOBUTROL 1 MMOL/ML IV SOLN

[Series 9: TOF post-contrast · axial · non-contrast · 0.8mm · 0.57mm/px · z∈[-296,-48]mm · 24 of 311 slices shown]
[im 1/311]
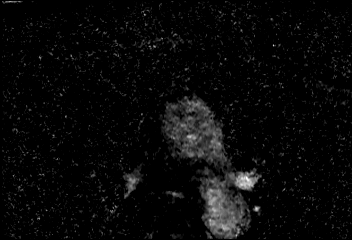
[im 14/311]
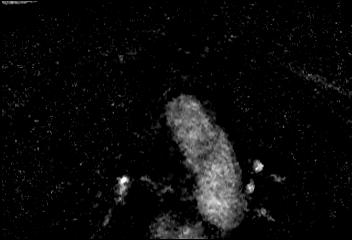
[im 27/311]
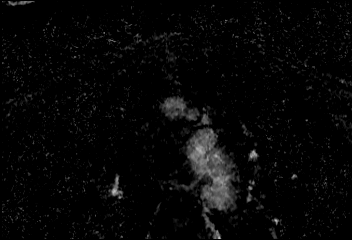
[im 41/311]
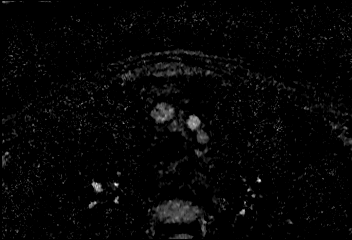
[im 54/311]
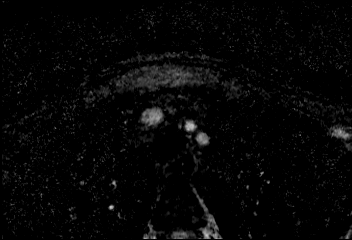
[im 68/311]
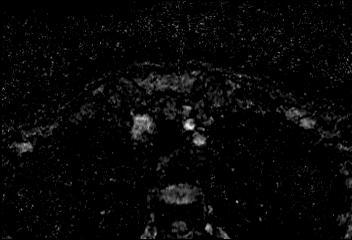
[im 81/311]
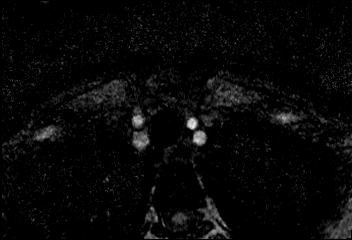
[im 95/311]
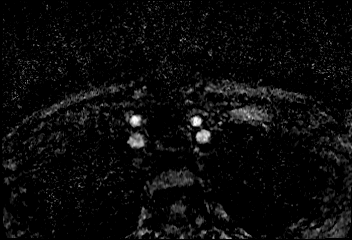
[im 108/311]
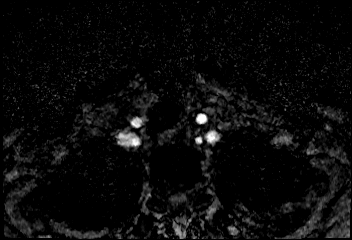
[im 122/311]
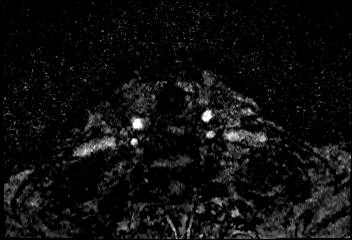
[im 135/311]
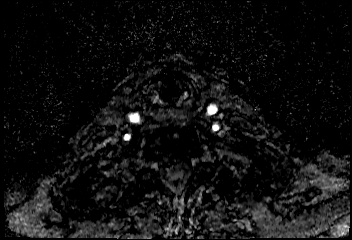
[im 149/311]
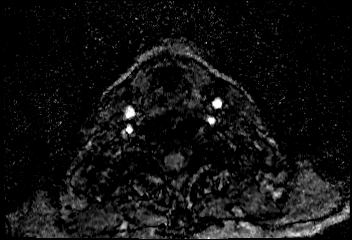
[im 162/311]
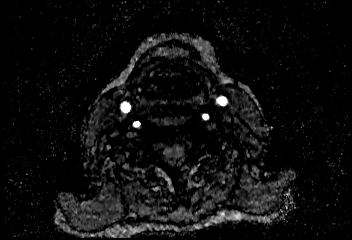
[im 176/311]
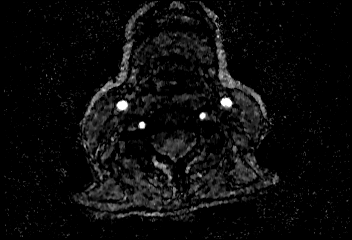
[im 189/311]
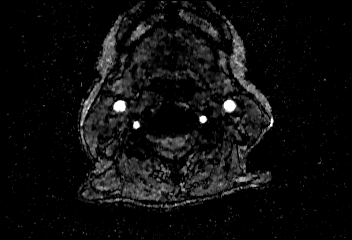
[im 203/311]
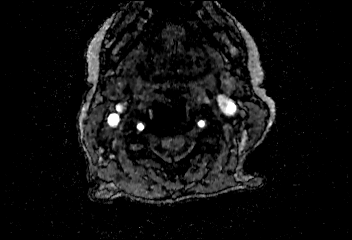
[im 216/311]
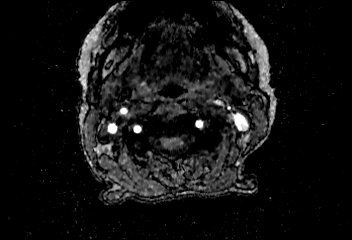
[im 230/311]
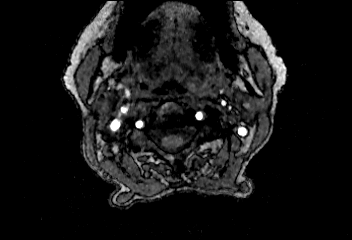
[im 243/311]
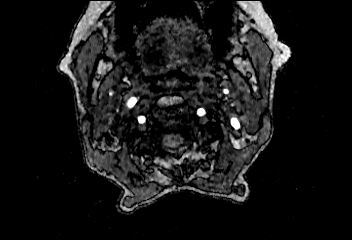
[im 257/311]
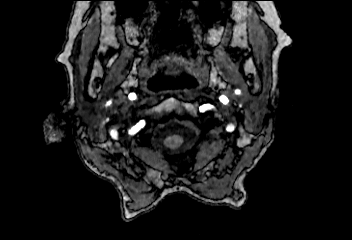
[im 270/311]
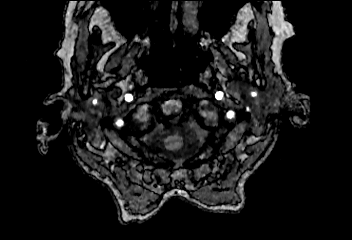
[im 284/311]
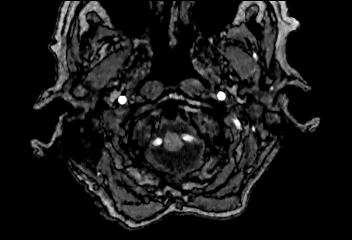
[im 297/311]
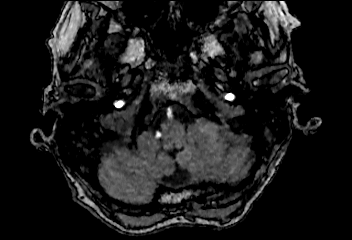
[im 311/311]
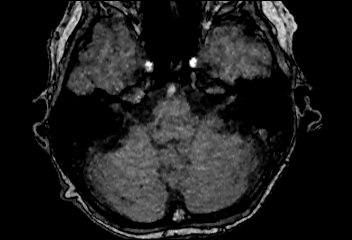

[Series 17: angio_fl3d_cor_post_ttc=2.0s · coronal · 0.9mm · 0.85mm/px · 8 of 96 slices shown]
[im 1/96]
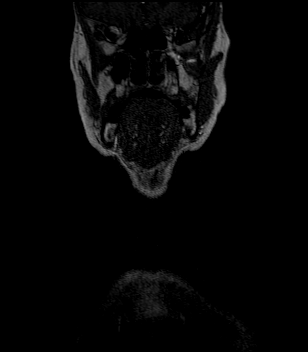
[im 14/96]
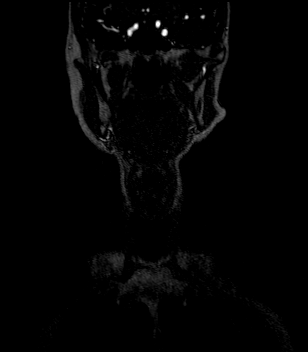
[im 28/96]
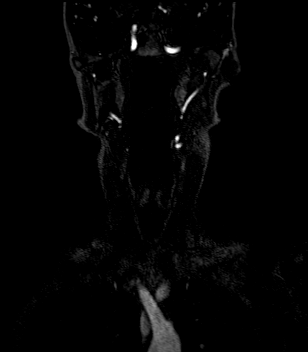
[im 41/96]
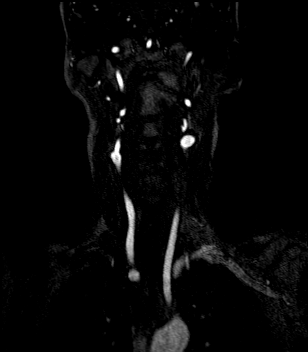
[im 55/96]
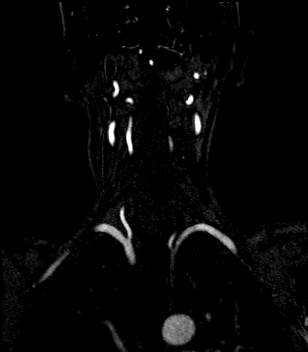
[im 68/96]
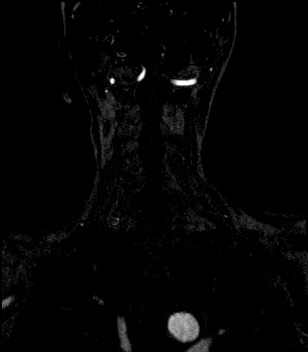
[im 82/96]
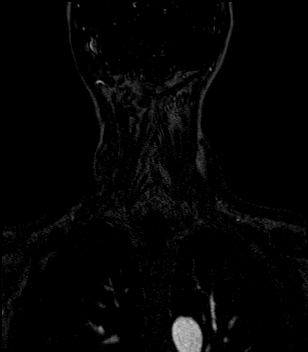
[im 96/96]
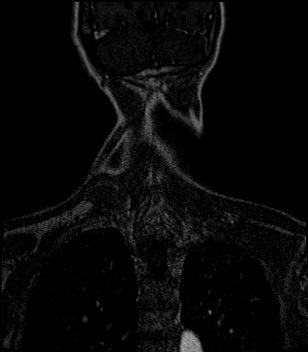

[32 of 48 positions shown; findings below may reference images not displayed]

FINDINGS: MRI HEAD FINDINGS

Brain: Faintly increased signal on diffusion-weighted imaging in the
right frontoparietal lobe (series 5, images 31-35) and occipital
cortex (series 5, images 27-28), with ADC correlates. These areas
are associated with increased T2 signal, with additional T2 signal
and gyral swelling in the right frontoparietal cortex (series 15,
image 36) and left temporal occipital area (series 15, image 21),
also likely related to the patient's recent acute infarcts.

No acute hemorrhage, mass, mass effect, or midline shift. No
hydrocephalus or extra-axial collection. No hemosiderin deposition
to suggest remote hemorrhage.

Vascular: Please see MRA findings below.

Skull and upper cervical spine: Normal marrow signal.

Sinuses/Orbits: Clear paranasal sinuses. Status post bilateral lens
replacements.

Other: Fluid throughout the mastoid air cells.

MRA HEAD FINDINGS

Anterior circulation: Both internal carotid arteries are patent to
the termini, without significant stenosis.

A1 segments patent. Normal anterior communicating artery. Anterior
cerebral arteries are patent to their distal aspects.

No M1 stenosis or occlusion. Normal MCA bifurcations. Distal MCA
branches perfused and symmetric.

Posterior circulation: Vertebral arteries patent to the
vertebrobasilar junction without stenosis. Posterior inferior
cerebral arteries patent bilaterally.

Basilar patent to its distal aspect. Superior cerebellar arteries
patent bilaterally.

Patent P1 segments. PCAs perfused to their distal aspects without
stenosis. The bilateral posterior communicating arteries are not
visualized.

Anatomic variants: None significant

MRA NECK FINDINGS

Aortic arch: Two-vessel arch with a common origin of the
brachiocephalic and left common carotid arteries. Imaged portion
shows no evidence of aneurysm or dissection. No significant stenosis
of the major arch vessel origins.

Right carotid system: No evidence of dissection, occlusion, or
hemodynamically significant stenosis (greater than 50%).

Left carotid system: No evidence of dissection, occlusion, or
hemodynamically significant stenosis (greater than 50%).

Vertebral arteries: No evidence of dissection, occlusion, or
hemodynamically significant stenosis (greater than 50%).

Other: None
IMPRESSION: 1. Ill-defined restricted diffusion in the right frontoparietal
region and occipital cortex are favored to represent acute and/or
subacute infarcts.
2. Sequela of recent acute infarcts in the right frontoparietal and
temporal occipital regions.
3. No intracranial large vessel occlusion or significant stenosis.
4. No hemodynamically significant stenosis in the neck.

These results were called by telephone at the time of interpretation
on [DATE] at [DATE] to provider BONET NP, who verbally
acknowledged these results.

## 2021-11-08 IMAGING — MR MR HEAD W/O CM
12 series · 43 of 48 positions shown · IV contrast (gadavist)
Comparison: [DATE] MRI head

CLINICAL DATA: Increased weakness, prior left-sided deficits

EXAM:
MRI HEAD WITHOUT CONTRAST
MRA HEAD WITHOUT CONTRAST
MRA NECK WITHOUT AND WITH CONTRAST
TECHNIQUE: Multiplanar, multi-echo pulse sequences of the brain and surrounding
structures were acquired without intravenous contrast. Angiographic
images of the Circle of Willis were acquired using MRA technique
without intravenous contrast. Angiographic images of the neck were
acquired using MRA technique without and with intravenous contrast.
Carotid stenosis measurements (when applicable) are obtained
utilizing NASCET criteria, using the distal internal carotid
diameter as the denominator.
CONTRAST:  5mL GADAVIST GADOBUTROL 1 MMOL/ML IV SOLN

[Series 5: ax dwi_tracew · axial · 3.0mm · 0.65mm/px · z∈[-104,+40]mm · 2 of 46 slices shown]
[im 1/46]
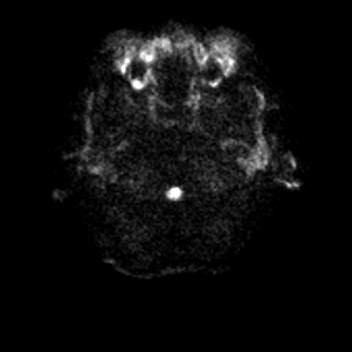
[im 46/46]
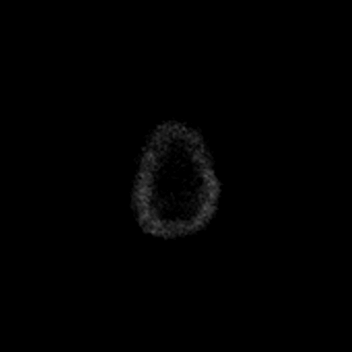

[Series 6: ax dwi_adc · axial · 3.0mm · 0.65mm/px · z∈[-104,+40]mm · 2 of 46 slices shown]
[im 1/46]
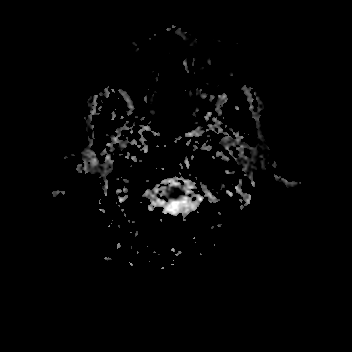
[im 46/46]
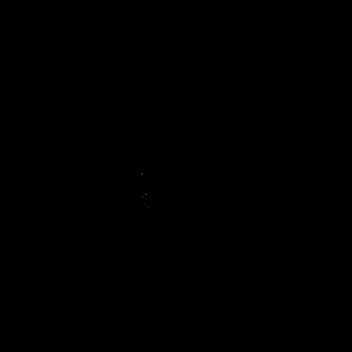

[Series 7: cor dwi_tracew · coronal · 5.0mm · 0.60mm/px · 3 of 36 slices shown]
[im 1/36]
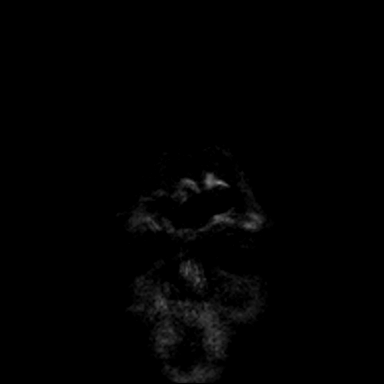
[im 18/36]
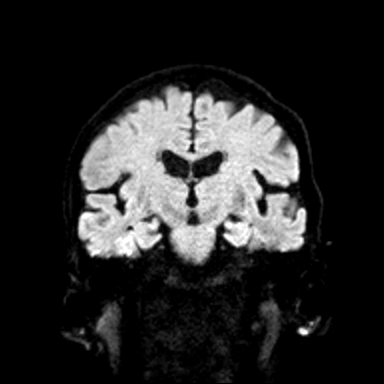
[im 36/36]
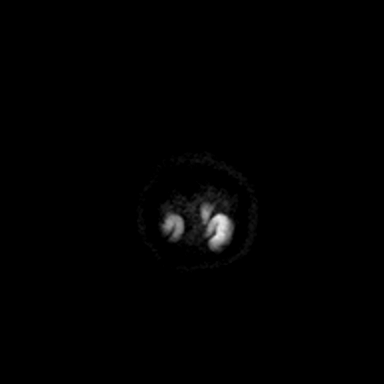

[Series 8: cor dwi_adc · coronal · 5.0mm · 0.60mm/px · 3 of 36 slices shown]
[im 1/36]
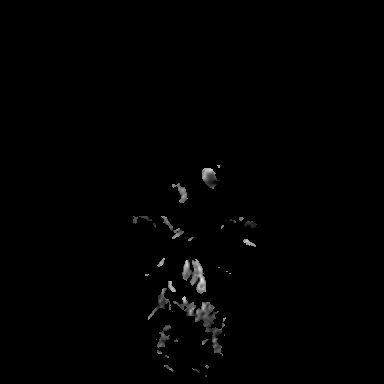
[im 18/36]
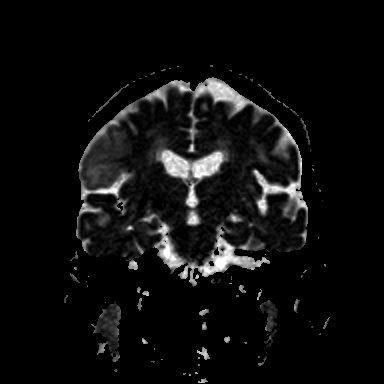
[im 36/36]
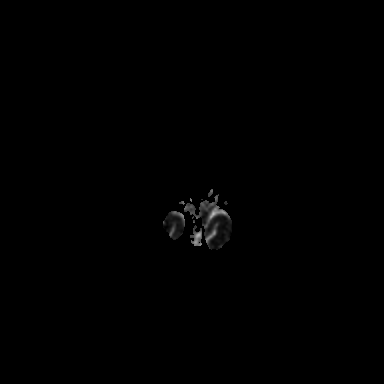

[Series 9: T1 · sagittal · 5.0mm · 0.62mm/px · 2 of 22 slices shown (1 of 2)]
[im 1/22]
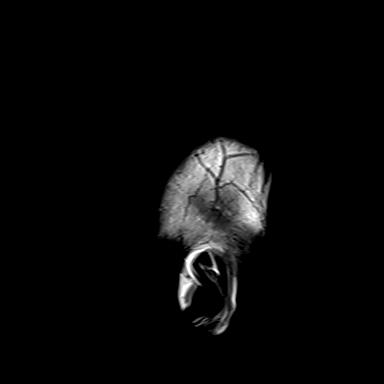
[im 22/22]
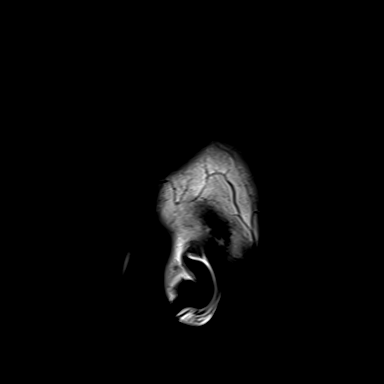

[Series 10: T2 · axial · 5.0mm · 0.53mm/px · z∈[-104,+42]mm · 2 of 26 slices shown (1 of 2)]
[im 1/26]
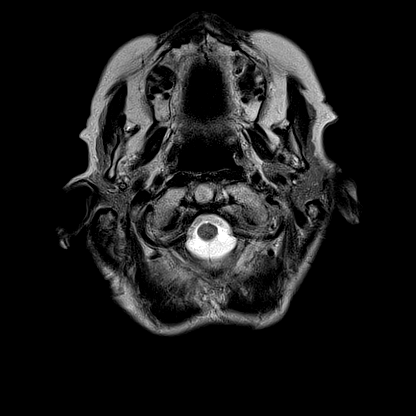
[im 26/26]
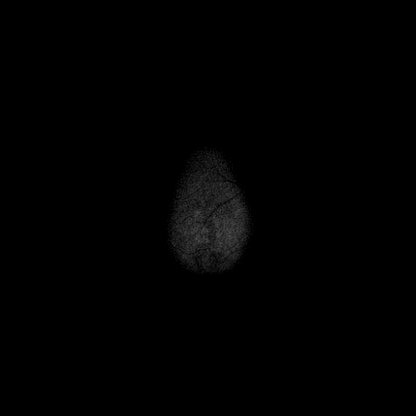

[Series 11: ax swi_mag · axial · 2.0mm · 0.90mm/px · z∈[-109,+45]mm · 6 of 80 slices shown]
[im 1/80]
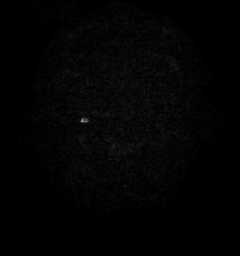
[im 16/80]
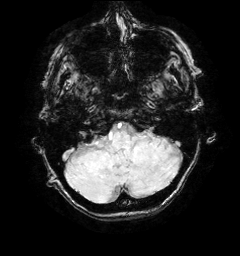
[im 32/80]
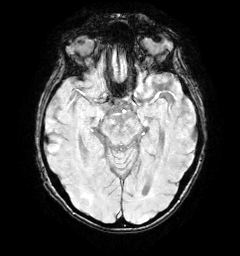
[im 48/80]
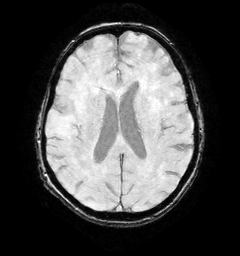
[im 64/80]
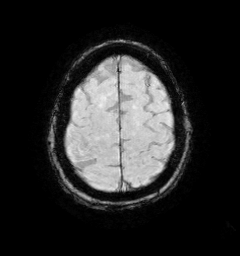
[im 80/80]
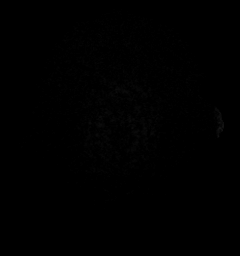

[Series 12: ax swi_pha · axial · 2.0mm · 0.90mm/px · z∈[-109,+45]mm · 6 of 80 slices shown]
[im 1/80]
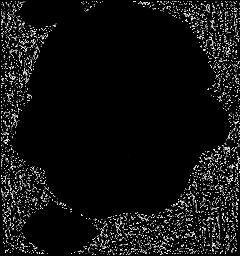
[im 16/80]
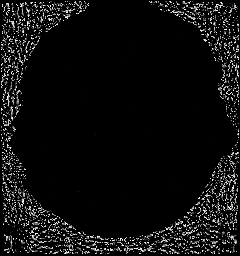
[im 32/80]
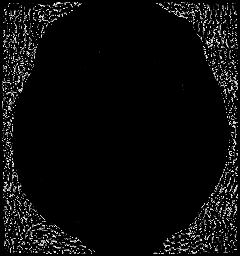
[im 48/80]
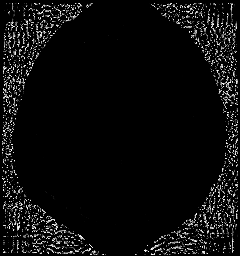
[im 64/80]
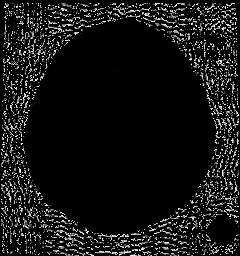
[im 80/80]
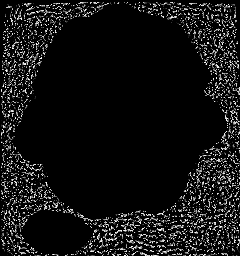

[Series 13: ax swi_swi · axial · 2.0mm · 0.90mm/px · z∈[-109,-17]mm · 4 of 80 slices shown]
[im 1/80]
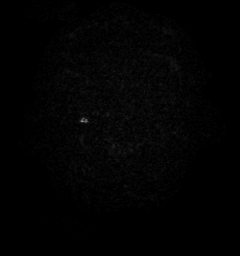
[im 16/80]
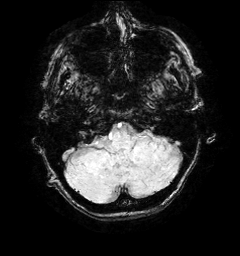
[im 32/80]
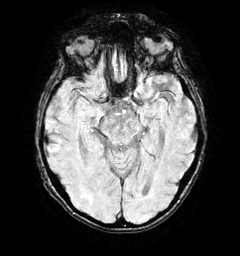
[im 48/80]
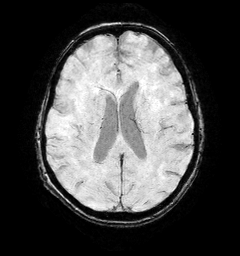

[Series 15: FLAIR · axial · 3.0mm · 0.53mm/px · z∈[-99,+38]mm · 3 of 48 slices shown]
[im 1/48]
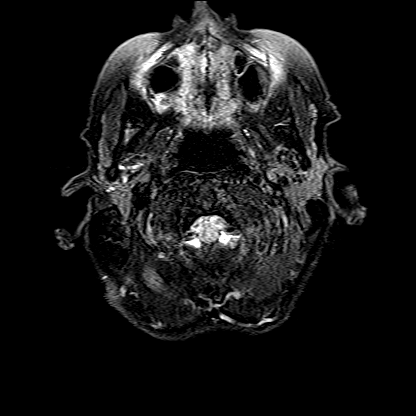
[im 24/48]
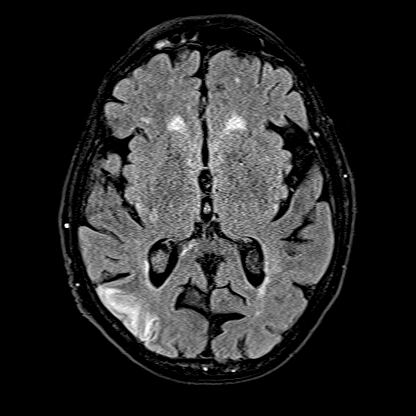
[im 48/48]
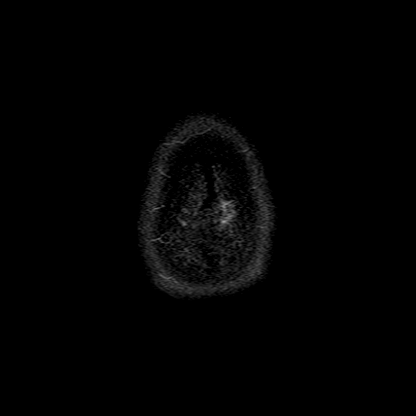

[Series 16: T1 · axial · 1.0mm · 0.98mm/px · z∈[-112,+43]mm · 8 of 160 slices shown (2 of 2)]
[im 1/160]
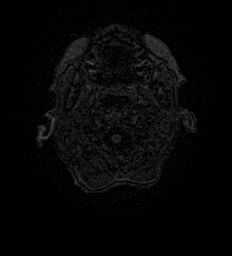
[im 32/160]
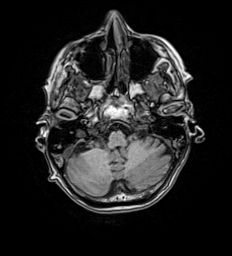
[im 48/160]
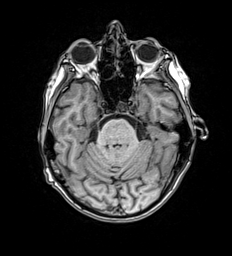
[im 64/160]
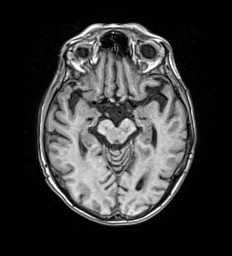
[im 96/160]
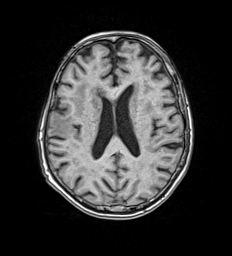
[im 112/160]
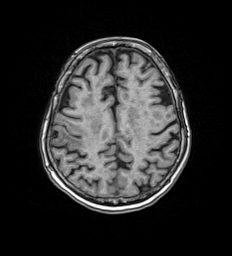
[im 128/160]
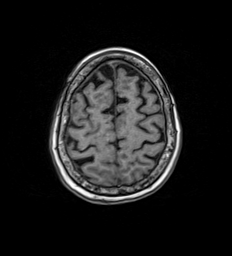
[im 160/160]
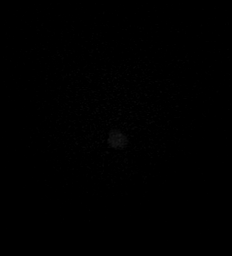

[Series 17: T2 · coronal · 5.0mm · 0.57mm/px · 2 of 28 slices shown (2 of 2)]
[im 1/28]
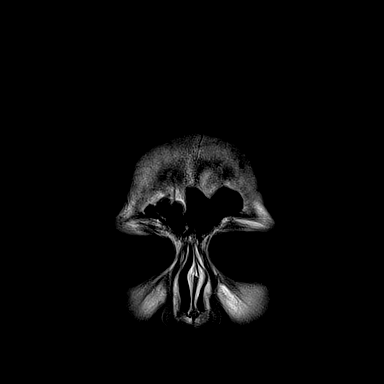
[im 28/28]
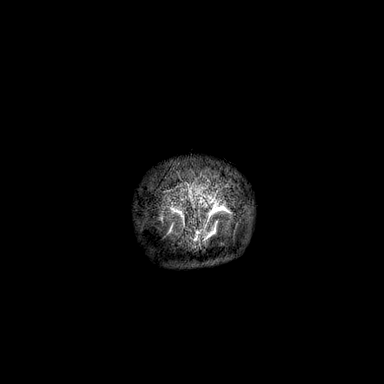

[43 of 48 positions shown; findings below may reference images not displayed]

FINDINGS: MRI HEAD FINDINGS

Brain: Faintly increased signal on diffusion-weighted imaging in the
right frontoparietal lobe (series 5, images 31-35) and occipital
cortex (series 5, images 27-28), with ADC correlates. These areas
are associated with increased T2 signal, with additional T2 signal
and gyral swelling in the right frontoparietal cortex (series 15,
image 36) and left temporal occipital area (series 15, image 21),
also likely related to the patient's recent acute infarcts.

No acute hemorrhage, mass, mass effect, or midline shift. No
hydrocephalus or extra-axial collection. No hemosiderin deposition
to suggest remote hemorrhage.

Vascular: Please see MRA findings below.

Skull and upper cervical spine: Normal marrow signal.

Sinuses/Orbits: Clear paranasal sinuses. Status post bilateral lens
replacements.

Other: Fluid throughout the mastoid air cells.

MRA HEAD FINDINGS

Anterior circulation: Both internal carotid arteries are patent to
the termini, without significant stenosis.

A1 segments patent. Normal anterior communicating artery. Anterior
cerebral arteries are patent to their distal aspects.

No M1 stenosis or occlusion. Normal MCA bifurcations. Distal MCA
branches perfused and symmetric.

Posterior circulation: Vertebral arteries patent to the
vertebrobasilar junction without stenosis. Posterior inferior
cerebral arteries patent bilaterally.

Basilar patent to its distal aspect. Superior cerebellar arteries
patent bilaterally.

Patent P1 segments. PCAs perfused to their distal aspects without
stenosis. The bilateral posterior communicating arteries are not
visualized.

Anatomic variants: None significant

MRA NECK FINDINGS

Aortic arch: Two-vessel arch with a common origin of the
brachiocephalic and left common carotid arteries. Imaged portion
shows no evidence of aneurysm or dissection. No significant stenosis
of the major arch vessel origins.

Right carotid system: No evidence of dissection, occlusion, or
hemodynamically significant stenosis (greater than 50%).

Left carotid system: No evidence of dissection, occlusion, or
hemodynamically significant stenosis (greater than 50%).

Vertebral arteries: No evidence of dissection, occlusion, or
hemodynamically significant stenosis (greater than 50%).

Other: None
IMPRESSION: 1. Ill-defined restricted diffusion in the right frontoparietal
region and occipital cortex are favored to represent acute and/or
subacute infarcts.
2. Sequela of recent acute infarcts in the right frontoparietal and
temporal occipital regions.
3. No intracranial large vessel occlusion or significant stenosis.
4. No hemodynamically significant stenosis in the neck.

These results were called by telephone at the time of interpretation
on [DATE] at [DATE] to provider BONET NP, who verbally
acknowledged these results.

## 2021-11-08 MED ORDER — LORAZEPAM 0.5 MG PO TABS
0.5000 mg | ORAL_TABLET | Freq: Every evening | ORAL | Status: DC | PRN
  Filled 2021-11-08: qty 1

## 2021-11-08 MED ORDER — LEVOTHYROXINE SODIUM 50 MCG PO TABS
75.0000 ug | ORAL_TABLET | Freq: Every day | ORAL | Status: DC
  Filled 2021-11-08: qty 1

## 2021-11-08 MED ORDER — LORAZEPAM 0.5 MG PO TABS
0.5000 mg | ORAL_TABLET | Freq: Every evening | ORAL | Status: DC | PRN
  Administered 2021-11-08: 0.5 mg

## 2021-11-08 MED ORDER — MELATONIN 5 MG PO TABS
5.0000 mg | ORAL_TABLET | Freq: Every day | ORAL | Status: DC
  Administered 2021-11-08: 5 mg
  Filled 2021-11-08: qty 1

## 2021-11-08 MED ORDER — ASPIRIN 81 MG PO CHEW
81.0000 mg | CHEWABLE_TABLET | Freq: Every day | ORAL | Status: DC
  Administered 2021-11-08: 81 mg via ORAL
  Filled 2021-11-08: qty 1

## 2021-11-08 MED ORDER — OSMOLITE 1.2 CAL PO LIQD
1000.0000 mL | ORAL | Status: DC
  Administered 2021-11-08 – 2021-11-09 (×2): 1000 mL

## 2021-11-08 MED ORDER — GADOBUTROL 1 MMOL/ML IV SOLN
5.0000 mL | Freq: Once | INTRAVENOUS | Status: AC | PRN
  Administered 2021-11-08: 5 mL via INTRAVENOUS

## 2021-11-08 NOTE — ED Notes (Signed)
Patient currently in MRI.

## 2021-11-08 NOTE — ED Notes (Signed)
Patient assisted to use bedpan.

## 2021-11-08 NOTE — ED Notes (Signed)
Patient returned from MRI at this time.  

## 2021-11-08 NOTE — Progress Notes (Signed)
SLP Cancellation Note ? ?Patient Details ?Name: Kaylee Santiago ?MRN: [id number] ?DOB: [birth year] ? ? ?Cancelled treatment:       Reason Eval/Treat Not Completed: Medical issues which prohibited therapy  ? ?Consulted with PT. Per PT, pt with c/o headache (similar to how she felt prior to admission) and feeling "washed out" at end of session. RN and MD aware. SLP to defer cognitive-lingusitic evaluation at this time. Will re-attempt next date. ? ?Clyde Canterbury, M.S., CCC-SLP ?Speech-Language Pathologist ?[facility] ?[phone]  ? ?Woodroe Chen ?11/08/2021, 2:05 PM ?

## 2021-11-08 NOTE — Plan of Care (Signed)
?  Problem: Education: ?Goal: Knowledge of General Education information will improve ?Description: Including pain rating scale, medication(s)/side effects and non-pharmacologic comfort measures ?Outcome: Progressing ?  ?Problem: Health Behavior/Discharge Planning: ?Goal: Ability to manage health-related needs will improve ?Outcome: Progressing ?  ?Problem: Clinical Measurements: ?Goal: Ability to maintain clinical measurements within normal limits will improve ?Outcome: Progressing ?Goal: Will remain free from infection ?Outcome: Progressing ?Goal: Diagnostic test results will improve ?Outcome: Progressing ?Goal: Respiratory complications will improve ?Outcome: Progressing ?Goal: Cardiovascular complication will be avoided ?Outcome: Progressing ?  ?Problem: Activity: ?Goal: Risk for activity intolerance will decrease ?Outcome: Progressing ?  ?Problem: Nutrition: ?Goal: Adequate nutrition will be maintained ?Outcome: Progressing ?  ?Problem: Coping: ?Goal: Level of anxiety will decrease ?Outcome: Progressing ?  ?Problem: Elimination: ?Goal: Will not experience complications related to bowel motility ?Outcome: Progressing ?Goal: Will not experience complications related to urinary retention ?Outcome: Progressing ?  ?Problem: Pain Managment: ?Goal: General experience of comfort will improve ?Outcome: Progressing ?  ?Problem: Safety: ?Goal: Ability to remain free from injury will improve ?Outcome: Progressing ?  ?Problem: Skin Integrity: ?Goal: Risk for impaired skin integrity will decrease ?Outcome: Progressing ?  ?Problem: Education: ?Goal: Knowledge of disease or condition will improve ?Outcome: Progressing ?Goal: Knowledge of secondary prevention will improve (SELECT ALL) ?Outcome: Progressing ?Goal: Knowledge of patient specific risk factors will improve (INDIVIDUALIZE FOR PATIENT) ?Outcome: Progressing ?Goal: Individualized Educational Video(s) ?Outcome: Progressing ?  ?Problem: Coping: ?Goal: Will verbalize  positive feelings about [place] ?Outcome: Progressing ?Goal: Will identify appropriate support needs ?Outcome: Progressing ?  ?Problem: Health Behavior/Discharge Planning: ?Goal: Ability to manage health-related needs will improve ?Outcome: Progressing ?  ?Problem: [place]-Care: ?Goal: Ability to participate in [place]-care as condition permits will improve ?Outcome: Progressing ?Goal: Verbalization of feelings and concerns over difficulty with [place]-care will improve ?Outcome: Progressing ?Goal: Ability to communicate needs accurately will improve ?Outcome: Progressing ?  ?Problem: Nutrition: ?Goal: Risk of aspiration will decrease ?Outcome: Progressing ?Goal: Dietary intake will improve ?Outcome: Progressing ?  ?Problem: Intracerebral Hemorrhage Tissue Perfusion: ?Goal: Complications of Intracerebral Hemorrhage will be minimized ?Outcome: Progressing ?  ?Problem: Ischemic Stroke/TIA Tissue Perfusion: ?Goal: Complications of ischemic stroke/TIA will be minimized ?Outcome: Progressing ?  ?

## 2021-11-08 NOTE — Evaluation (Signed)
Physical Therapy Evaluation ?Patient Details ?Name: Kaylee Santiago ?MRN: [id number] ?DOB: [birth year] ?Today's Date: 11/08/2021 ? ?History of Present Illness ? Pt is an 87 y/o F admitted on 11/07/21 after presenting to the ED for evaluation of RUE weakness, increased weakness in LUE, & increased difficulty speaking. MRI reveals Ill-defined restricted diffusion in the right frontoparietal  region and occipital cortex are favored to represent acute and/or  subacute infarcts, Sequela of recent acute infarcts in the right frontoparietal & temporal occipital regions. 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 Pt seen for PT evaluation. Pt is an extremely pleasant woman who reports prior to initial stroke she was living alone in a 1 level home with 2 steps to enter, ambulating independently without AD, & driving. Pt is able to recall all recent events & reports RUE/RLE symptoms have completely resolved. Pt is able to ambulate without AD on this date with CGA but does fatigue quickly. Upon return to room pt reports feeling "washed out" & like she did yesterday before she came to the hospital, also endorses a HA. Nursed called to room & vitals checked; HR 97 bpm, SPO2 98% on room air, BP in LUE 179/76 mmHg (MAP 103). Will continue to follow pt acutely to address balance, endurance, neuromuscular re-education and gait with LRAD. Pt is anticipating d/c back to [facility] & pt would benefit from STR upon d/c to maximize independence with functional mobility & reduce fall risk prior to return home.   ?   ? ?Recommendations for follow up therapy are one component of a multi-disciplinary discharge planning process, led by the attending physician.  Recommendations may be updated based on patient status, additional functional criteria and insurance authorization. ? ?Follow Up Recommendations Skilled nursing-short term rehab (<3 hours/day) (pt is  anticipating return to [facility]) ? ?  ?Assistance Recommended at Discharge Intermittent Supervision/Assistance  ?Patient can return home with the following ? A little help with walking and/or transfers;A little help with bathing/dressing/bathroom;Assistance with cooking/housework;Assist for transportation;Help with stairs or ramp for entrance ? ?  ?Equipment Recommendations None recommended by PT  ?Recommendations for Other Services ?    ?  ?Functional Status Assessment Patient has had a recent decline in their functional status and demonstrates the ability to make significant improvements in function in a reasonable and predictable amount of time.  ? ?  ?Precautions / Restrictions Precautions ?Precautions: Fall ?Precaution Comments: PEG tube ?Restrictions ?Weight Bearing Restrictions: No  ? ?  ? ?Mobility ? Bed Mobility ?  ?  ?  ?  ?  ?  ?  ?General bed mobility comments: not observed ?  ? ?Transfers ?Overall transfer level: Needs assistance ?Equipment used: None ?Transfers: Sit to/from Stand ?Sit to Stand: Supervision ?  ?  ?  ?  ?  ?  ?  ? ?Ambulation/Gait ?Ambulation/Gait assistance: Min guard ?Gait Distance (Feet): 65 Feet ?Assistive device: None ?Gait Pattern/deviations: Decreased stride length ?Gait velocity: decreased ?  ?  ?General Gait Details: Pt initially with decreased dorsiflexion LLE & heel strike but this improves as pt continues to ambulate. Pt with decreased LUE reciprocal arm swing. Pt fatigues quickly. ? ?Stairs ?  ?  ?  ?  ?  ? ?Wheelchair Mobility ?  ? ?Modified Rankin (Stroke Patients Only) ?  ? ?  ? ?Balance Overall balance assessment: Needs assistance ?Sitting-balance support: Feet supported ?Sitting balance-Leahy Scale: Good ?Sitting balance - Comments:  pt dons socks sitting EOB with supervision (pt's feet noted to be red & pt reports this occurs when her feet are cold) ?  ?Standing balance support: No upper extremity supported, During functional activity ?Standing balance-Leahy  Scale: Poor ?  ?  ?  ?  ?  ?  ?  ?  ?  ?  ?  ?  ?   ? ? ? ?Pertinent Vitals/Pain Pain Assessment ?Pain Assessment: Faces ?Faces Pain Scale: Hurts little more ?Pain Location: HA at end of session ?Pain Descriptors / Indicators: Discomfort ?Pain Intervention(s): Monitored during session (notified nurse)  ? ? ?Home Living Family/patient expects to be discharged to:: Skilled nursing facility (pt anticipates to return to [facility]) ?Living Arrangements: Alone ?Available Help at Discharge: Family;Available PRN/intermittently ?Type of Home: House ?Home Access: Stairs to enter ?Entrance Stairs-Rails: None ?Entrance Stairs-Number of Steps: 2 ?  ?Home Layout: One level ?Home Equipment: Agricultural consultant (2 wheels) ?   ?  ?Prior Function Prior Level of Function : Needs assist ?  ?  ?  ?  ?  ?  ?Mobility Comments: Pt reports prior to initial stroke she was independent without AD, driving. While in [facility] for rehab, she was working on ambulating without AD as well. ?ADLs Comments: Mod I in ADLs, living alone, receives PRN assistance from niece ?  ? ? ?Hand Dominance  ? Dominant Hand: Left ? ?  ?Extremity/Trunk Assessment  ? Upper Extremity Assessment ?Upper Extremity Assessment: Generalized weakness;LUE deficits/detail;RUE deficits/detail ?RUE Deficits / Details: Pt reports RUE symptoms have completely resolved. No gross issues noted. ?LUE Deficits / Details: LUE paresis since CVA ~ 2 weeks ago ?LUE Coordination: decreased fine motor;decreased gross motor ?  ? ?Lower Extremity Assessment ?Lower Extremity Assessment: RLE deficits/detail;LLE deficits/detail ?RLE Deficits / Details: Pt reports all RLE weakness/symptoms have resolved. BLE proprioception & sensation (to light touch) intact. ?LLE Deficits / Details: BLE proprioception & sensation (to light touch) intact. ?  ? ?Cervical / Trunk Assessment ?Cervical / Trunk Assessment: Normal  ?Communication  ? Communication: Expressive difficulties  ?Cognition   ?Behavior During  Therapy: [facility] for tasks assessed/performed ?Overall Cognitive Status: Within Functional Limits for tasks assessed ?  ?  ?  ?  ?  ?  ?  ?  ?  ?  ?  ?  ?  ?  ?  ?  ?General Comments: Extremely sweet & pleasant lady, motivated to return to PLOF. ?  ?  ? ?  ?General Comments   ? ?  ?Exercises    ? ?Assessment/Plan  ?  ?PT Assessment Patient needs continued PT services  ?PT Problem List Decreased strength;Decreased mobility;Decreased activity tolerance;Cardiopulmonary status limiting activity;Decreased balance;Decreased knowledge of use of DME ? ?   ?  ?PT Treatment Interventions DME instruction;Therapeutic activities;Modalities;Gait training;Therapeutic exercise;Patient/family education;Stair training;Balance training;Functional mobility training;Neuromuscular re-education;Manual techniques   ? ?PT Goals (Current goals can be found in the Care Plan section)  ?Acute Rehab PT Goals ?Patient Stated Goal: get better, return to PLOF ?PT Goal Formulation: With patient ?Time For Goal Achievement: 11/22/21 ?Potential to Achieve Goals: Good ? ?  ?Frequency 7X/week ?  ? ? ?Co-evaluation   ?  ?  ?  ?  ? ? ?  ?AM-PAC PT "6 Clicks" Mobility  ?Outcome Measure Help needed turning from your back to your side while in a flat bed without using bedrails?: None ?Help needed moving from lying on your back to sitting on the side of a flat bed without using bedrails?: A  Little ?Help needed moving to and from a bed to a chair (including a wheelchair)?: A Little ?Help needed standing up from a chair using your arms (e.g., wheelchair or bedside chair)?: None ?Help needed to walk in hospital room?: A Little ?Help needed climbing 3-5 steps with a railing? : A Little ?6 Click Score: 20 ? ?  ?End of Session   ?Activity Tolerance: Patient limited by fatigue ?Patient left: in chair;with chair alarm set;with call bell/phone within reach ?Nurse Communication: Mobility status (c/o pain/symptoms during session) ?PT Visit Diagnosis: Muscle weakness  (generalized) (M62.81);Unsteadiness on feet (R26.81) ?  ? ?Time: 9604-54091338-1357 ?PT Time Calculation (min) (ACUTE ONLY): 19 min ? ? ?Charges:   PT Evaluation ?$PT Eval Moderate Complexity: 1 Mod ?  ?  ?   ? ? ?Aleda GranaVictoria Bevelyn Arriola

## 2021-11-08 NOTE — Evaluation (Signed)
Occupational Therapy Evaluation ?Patient Details ?Name: Kaylee Santiago ?MRN: [id number] ?DOB: Feb 04, 1935 ?Today's Date: 11/08/2021 ? ? ?History of Present Illness Kaylee Santiago is a 87 y.o. female with medical history significant for A-fib on Xarelto, HTN and anxiety, with recent right MCA infarct on 4/14 with residual dysarthria and left hemiparesis, currently with PEG tube, [facility], who was sent to the ED for evaluation weakness in the right arm, and increased weakness in the left arm as well as increased difficulty getting words out and slurred speech.  A code stroke was called from the ED and initial CT head showed no acute intracranial process.  ? ?Clinical Impression ?  ?Kaylee Santiago presents with generalized weakness, limited endurance, expressive difficulties, and L hemiparesis. [facility] a MCA infarct, then transferred to [facility] for rehabilitation. Prior to CVA, she was living alone in a single-story home, largely IND in ADL/fxl mobility. During today's evaluation, she has limited ROM and strength in L UE, requires ModA for LB dressing on R side, needs increased time and effort for transfers, SUPV for safety, with several small LOB episodes in ambulation with RW, from which she is able to quickly Melcher-correct. Recommend ongoing OT while hospitalized, w/ DC back to [facility] to continue post-CVA rehab process.   ? ?Recommendations for follow up therapy are one component of a multi-disciplinary discharge planning process, led by the attending physician.  Recommendations may be updated based on patient status, additional functional criteria and insurance authorization.  ? ?Follow Up Recommendations ? Skilled nursing-short term rehab (<3 hours/day)  ?  ?Assistance Recommended at Discharge Intermittent Supervision/Assistance  ?Patient can return home with the following A little help with walking and/or transfers;A little help with  bathing/dressing/bathroom;Assistance with cooking/housework ? ?  ?Functional Status Assessment ? Patient has had a recent decline in their functional status and demonstrates the ability to make significant improvements in function in a reasonable and predictable amount of time.  ?Equipment Recommendations ? None recommended by OT  ?  ?Recommendations for Other Services   ? ? ?  ?Precautions / Restrictions Precautions ?Precautions: Fall ?Restrictions ?Weight Bearing Restrictions: No  ? ?  ? ?Mobility Bed Mobility ?Overal bed mobility: Needs Assistance ?Bed Mobility: Supine to Sit, Sit to Supine ?  ?  ?Supine to sit: Supervision ?Sit to supine: Supervision ?  ?  ?  ? ?Transfers ?Overall transfer level: Needs assistance ?Equipment used: Rolling walker (2 wheels) ?Transfers: Sit to/from Stand, Bed to chair/wheelchair/BSC ?Sit to Stand: Supervision ?  ?  ?Step pivot transfers: Supervision ?  ?  ?  ?  ? ?  ?Balance Overall balance assessment: Needs assistance ?Sitting-balance support: Single extremity supported ?Sitting balance-Leahy Scale: Good ?  ?  ?Standing balance support: Bilateral upper extremity supported, During functional activity, Reliant on assistive device for balance ?Standing balance-Leahy Scale: Good ?  ?  ?  ?  ?  ?  ?  ?  ?  ?  ?  ?  ?   ? ?ADL either performed or assessed with clinical judgement  ? ?ADL Overall ADL's : Needs assistance/impaired ?Eating/Feeding: NPO ?Eating/Feeding Details (indicate cue type and reason): pt with PEG tube ?Grooming: Wash/dry hands;Wash/dry face;Sitting;Modified independent;Set up ?  ?Upper Body Bathing: Sitting;Modified independent;Set up ?Upper Body Bathing Details (indicate cue type and reason): using RUE only ?  ?  ?  ?  ?Lower Body Dressing: Modified independent;Moderate assistance ?Lower Body Dressing Details (indicate cue type  and reason): able to don L sock with Mod I, required Mod A for R sock ?Toilet Transfer: Supervision/safety;BSC/3in1;Ambulation;Rollator (4  wheels) ?  ?Toileting- Clothing Manipulation and Hygiene: Minimal assistance ?Toileting - Clothing Manipulation Details (indicate cue type and reason): Required Min A for lowering/raising underwear, given PEG tube apparatus ?  ?  ?  ?   ? ? ? ?Vision   ?   ?   ?Perception   ?  ?Praxis   ?  ? ?Pertinent Vitals/Pain Pain Assessment ?Pain Assessment: No/denies pain  ? ? ? ?Hand Dominance   ?  ?Extremity/Trunk Assessment Upper Extremity Assessment ?Upper Extremity Assessment: Generalized weakness;LUE deficits/detail;RUE deficits/detail ?RUE Deficits / Details: Pt reported to ED yesterday evening with recent-onset RUE weakness, but appears WNL this AM ?LUE Deficits / Details: LUE paresis since CVA ~ 2 weeks ago ?LUE Coordination: decreased fine motor;decreased gross motor ?  ?Lower Extremity Assessment ?Lower Extremity Assessment: Generalized weakness ?  ?  ?  ?Communication Communication ?Communication: Expressive difficulties ?  ?Cognition Arousal/Alertness: Awake/alert ?Behavior During Therapy: [facility] for tasks assessed/performed ?Overall Cognitive Status: Within Functional Limits for tasks assessed ?  ?  ?  ?  ?  ?  ?  ?  ?  ?  ?  ?  ?  ?  ?  ?  ?General Comments: Pt and niece both report she is having more difficult with word-finding and speaking than at baseline ?  ?  ?General Comments    ? ?  ?Exercises Other Exercises ?Other Exercises: Educ re: POC, home safety, LB dressing, DC recs ?  ?Shoulder Instructions    ? ? ?Home Living Family/patient expects to be discharged to:: Private residence ?Living Arrangements: Alone ?Available Help at Discharge: Family;Available PRN/intermittently ?Type of Home: House ?Home Access: Stairs to enter ?Entrance Stairs-Number of Steps: 2 ?  ?Home Layout: One level ?  ?  ?Bathroom Shower/Tub: Walk-in shower ?  ?Bathroom Toilet: Standard ?  ?  ?Home Equipment: Agricultural consultant (2 wheels) ?  ?  ?  ? ?  ?Prior Functioning/Environment Prior Level of Function : Needs assist ?  ?  ?  ?  ?  ?   ?Mobility Comments: ambulate with RW ?ADLs Comments: Mod I in ADLs, living alone, receives PRN assistance from niece ?  ? ?  ?  ?OT Problem List: Decreased strength;Decreased activity tolerance;Decreased coordination;Impaired UE functional use;Decreased range of motion;Decreased cognition ?  ?   ?OT Treatment/Interventions: [place]-care/ADL training;Therapeutic exercise;Patient/family education;Balance training;Energy conservation;Therapeutic activities;DME and/or AE instruction  ?  ?OT Goals(Current goals can be found in the care plan section) Acute Rehab OT Goals ?Patient Stated Goal: to get back to pre-stroke level ?OT Goal Formulation: With patient ?Time For Goal Achievement: 11/22/21 ?Potential to Achieve Goals: Good ?ADL Goals ?Pt Will Perform Eating: with adaptive utensils;with supervision ?Pt Will Perform Grooming: with supervision;standing (in standing at sink for 5+ minutes w/o LOB) ?Pt/caregiver will Perform Home Exercise Program: Increased ROM;Increased strength;Left upper extremity;With Supervision  ?OT Frequency: Min 2X/week ?  ? ?Co-evaluation   ?  ?  ?  ?  ? ?  ?AM-PAC OT "6 Clicks" Daily Activity     ?Outcome Measure Help from another person eating meals?: A Little ?Help from another person taking care of personal grooming?: A Little ?Help from another person toileting, which includes using toliet, bedpan, or urinal?: A Little ?Help from another person bathing (including washing, rinsing, drying)?: A Little ?Help from another person to put on and taking off regular upper body clothing?:  A Little ?Help from another person to put on and taking off regular lower body clothing?: A Little ?6 Click Score: 18 ?  ?End of Session Equipment Utilized During Treatment: Rolling walker (2 wheels) ?Nurse Communication: Mobility status ? ?Activity Tolerance: Patient tolerated treatment well ?Patient left: in bed;with call bell/phone within reach;with bed alarm set;with nursing/sitter in room;with family/visitor  present ? ?OT Visit Diagnosis: Unsteadiness on feet (R26.81);Muscle weakness (generalized) (M62.81)  ?              ?Time: [phone] ?OT Time Calculation (min): 18 min ?Charges:  OT General Charges ?$OT Visit: 1 Visit ?OT Evaluation ?$OT Ev

## 2021-11-08 NOTE — Progress Notes (Signed)
?  Progress Note ? ? ?Patient: Kaylee Santiago QPR:[id number] DOB: [birth year] DOA: 11/07/2021     1 ?DOS: the patient was seen and examined on 11/08/2021 ?  ?Brief hospital course: ?No notes on file ? ?Assessment and Plan: ?* Acute CVA (cerebrovascular accident) (HCC) ?--MRI showed Ill-defined restricted diffusion in the right frontoparietal region and occipital cortex are favored to represent acute and/or subacute infarcts. ?Plan: ?--neuro consult today ?--Hold BP for permissive HTN ?--hold Xarelto for now ?--cont on tele ?--PT/OT/SLP ? ? ?Chronic anticoagulation ?Xarelto on hold as above ? ?AF (paroxysmal atrial fibrillation) (HCC) ?Holding Xarelto per neurology  ?Holding metoprolol to allow for permissive hypertension ? ?Hyponatremia ?Chronic and at baseline. ? ?Hypertension ?BP 132/75 so holding all antihypertensives to allow for permissive hypertension ? ?Hypothyroidism ?Continue levothyroxine ? ?Dysarthria as late effect of cerebrovascular disease ?Continue speech therapy ? ?Dysphagia due to recent cerebrovascular accident ?Speech therapy eval ? ?Hemiparesis affecting left side as late effect of cerebrovascular accident (CVA) (HCC) ?Increase nursing assistance for transfers ?Fall precautions ? ?S/P percutaneous endoscopic gastrostomy (PEG) tube placement (HCC) ?Secondary to dysarthria from recent CVA ?Speech therapy eval  ?--start tube feed with Osmolite ?--NPO ? ? ? ? ?  ? ?Subjective:  ?Pt reported right arm weakness resolved. ? ? ?Physical Exam: ? ?Constitutional: NAD, AAOx3 ?HEENT: conjunctivae and lids normal, EOMI ?CV: No cyanosis.   ?RESP: normal respiratory effort, on RA ?Extremities: No effusions, edema in BLE ?SKIN: warm, dry ?Psych: Normal mood and affect.  Appropriate judgement and reason ? ? ?Data Reviewed: ? ?Family Communication: niece updated at bedside today ? ?Disposition: ?Status is: Inpatient ? ? Planned Discharge Destination: Rehab ? ? ? ?Time spent: 50 minutes ? ?Author: ?Darlin Priestly, MD ?11/08/2021  5:21 PM ? ?For on call review [email or web address].  ?

## 2021-11-09 DIAGNOSIS — I480 Paroxysmal atrial fibrillation: Secondary | ICD-10-CM | POA: Diagnosis not present

## 2021-11-09 LAB — CBC
HCT: 39.3 % (ref 36.0–46.0)
Hemoglobin: 12.8 g/dL (ref 12.0–15.0)
MCH: 29.5 pg (ref 26.0–34.0)
MCHC: 32.6 g/dL (ref 30.0–36.0)
MCV: 90.6 fL (ref 80.0–100.0)
Platelets: 340 10*3/uL (ref 150–400)
RBC: 4.34 MIL/uL (ref 3.87–5.11)
RDW: 12.5 % (ref 11.5–15.5)
WBC: 6.9 10*3/uL (ref 4.0–10.5)
nRBC: 0 % (ref 0.0–0.2)

## 2021-11-09 LAB — BASIC METABOLIC PANEL
Anion gap: 7 (ref 5–15)
BUN: 20 mg/dL (ref 8–23)
CO2: 30 mmol/L (ref 22–32)
Calcium: 9.1 mg/dL (ref 8.9–10.3)
Chloride: 96 mmol/L — ABNORMAL LOW (ref 98–111)
Creatinine, Ser: 0.7 mg/dL (ref 0.44–1.00)
GFR, Estimated: >60 mL/min (ref >60)
Glucose, Bld: 125 mg/dL — ABNORMAL HIGH (ref 70–99)
Potassium: 4.2 mmol/L (ref 3.5–5.1)
Sodium: 133 mmol/L — ABNORMAL LOW (ref 135–145)

## 2021-11-09 LAB — MAGNESIUM: Magnesium: 2.4 mg/dL (ref 1.7–2.4)

## 2021-11-09 LAB — GLUCOSE, CAPILLARY: Glucose-Capillary: 119 mg/dL — ABNORMAL HIGH (ref 70–99)

## 2021-11-09 MED ORDER — METOPROLOL TARTRATE 25 MG PO TABS
25.0000 mg | ORAL_TABLET | Freq: Two times a day (BID) | ORAL | Status: DC
Start: 2021-11-09 — End: 2021-11-09
  Administered 2021-11-09: 25 mg
  Filled 2021-11-09: qty 1

## 2021-11-09 MED ORDER — LEVOTHYROXINE SODIUM 50 MCG PO TABS
75.0000 ug | ORAL_TABLET | Freq: Every day | ORAL | Status: DC
  Administered 2021-11-09: 75 ug

## 2021-11-09 MED ORDER — METOPROLOL TARTRATE 25 MG PO TABS: 25.0000 mg | ORAL_TABLET | Freq: Two times a day (BID) | ORAL | Status: AC

## 2021-11-09 MED ORDER — RIVAROXABAN 15 MG PO TABS
15.0000 mg | ORAL_TABLET | Freq: Every day | ORAL | Status: DC
  Administered 2021-11-09: 15 mg
  Filled 2021-11-09: qty 1

## 2021-11-09 NOTE — TOC Initial Note (Signed)
Transition of Care (TOC) - Initial/Assessment Note  ? ? ?Patient Details  ?Name: Kaylee Santiago ?MRN: [id number] ?Date of Birth: 08/24/1934 ? ?Transition of Care (TOC) CM/SW Contact:    ?Caryn Section, RN ?Phone Number: ?11/09/2021, 10:17 AM ? ?Clinical Narrative:   Kaylee Santiago is a resident of  [facility], can return to room 801 per Inetta Fermo.  EMS contacted.  Patient is aware and agreeable of return to the facility, states she has already contacted her niece.            ? ? ?Expected Discharge Plan: Skilled Nursing Facility ?Barriers to Discharge: Barriers Resolved ? ? ?Patient Goals and CMS Choice ?Patient states their goals for this hospitalization and ongoing recovery are:: To go back to [facility] and hopfully home very soon ?  ?Choice offered to / list presented to : NA ? ?Expected Discharge Plan and Services ?Expected Discharge Plan: Skilled Nursing Facility ?  ?Discharge Planning Services: CM Consult ?Post Acute Care Choice: NA ?Living arrangements for the past 2 months: Skilled Nursing Facility ?Expected Discharge Date: 11/09/21               ?  ?  ?  ?  ?  ?  ?  ?  ?  ?  ? ?Prior Living Arrangements/Services ?Living arrangements for the past 2 months: Skilled Nursing Facility ?Lives with:: Facility Resident ?Patient language and need for interpreter reviewed:: Yes (No interpreter required) ?Do you feel safe going back to the place where you live?: Yes      ?Need for Family Participation in Patient Care: Yes (Comment) ?Care giver support system in place?: Yes (comment) ?  ?Criminal Activity/Legal Involvement Pertinent to Current Situation/Hospitalization: No - Comment as needed ? ?Activities of Daily Living ?Home Assistive Devices/Equipment: None ?ADL Screening (condition at time of admission) ?Patient's cognitive ability adequate to safely complete daily activities?: Yes ?Is the patient deaf or have difficulty hearing?: No ?Does the patient have difficulty seeing, even when wearing glasses/contacts?: No ?Does the  patient have difficulty concentrating, remembering, or making decisions?: No ?Patient able to express need for assistance with ADLs?: Yes ?Does the patient have difficulty dressing or bathing?: Yes ?Independently performs ADLs?: No ?Communication: Appropriate for developmental age ?Dressing (OT): Needs assistance ?Grooming: Needs assistance ?Does the patient have difficulty walking or climbing stairs?: Yes ?Weakness of Legs: Both ?Weakness of Arms/Hands: Both ? ?Permission Sought/Granted ?Permission sought to share information with : Case Manager ?Permission granted to share information with : Yes, Verbal Permission Granted ?   ? Permission granted to share info w AGENCY: [facility] ?   ?   ? ?Emotional Assessment ?Appearance:: Appears stated age ?Attitude/Demeanor/Rapport: Gracious, Engaged ?Affect (typically observed): Pleasant, Appropriate ?Orientation: : Oriented to [place], Oriented to Place, Oriented to  Time, Oriented to Situation ?Alcohol / Substance Use: Not Applicable ?Psych Involvement: No (comment) ? ?Admission diagnosis:  Weakness [R53.1] ?Acute CVA (cerebrovascular accident) (HCC) [I63.9] ?Cerebrovascular accident (CVA), unspecified mechanism (HCC) [I63.9] ?Patient Active Problem List  ? Diagnosis Date Noted  ? Acute CVA (cerebrovascular accident) (HCC) 11/07/2021  ? S/P percutaneous endoscopic gastrostomy (PEG) tube placement (HCC)   ? Chronic anticoagulation   ? Hemiparesis affecting left side as late effect of cerebrovascular accident (CVA) (HCC)   ? Dysphagia due to recent cerebrovascular accident   ? Dysarthria as late effect of cerebrovascular disease   ? Headache 10/09/2021  ? Protein-calorie malnutrition, severe 09/23/2021  ? Hypertensive urgency 09/21/2021  ? History of COVID-19 09/21/2021  ? Lower  extremity edema 09/21/2021  ? Hypophosphatemia   ? COPD with acute exacerbation (HCC)   ? Hyponatremia 07/01/2021  ? Bronchopneumonia 07/01/2021  ? Pneumonia due to COVID-19 virus 07/01/2021  ?  Chronic respiratory failure with hypoxia (HCC) 07/01/2021  ? History of epistaxis 07/01/2021  ? Acute metabolic encephalopathy 07/01/2021  ? Malnutrition of moderate degree 04/10/2021  ? Acute blood loss anemia 04/10/2021  ? Abnormal CT of the abdomen 04/10/2021  ? Acute on chronic respiratory failure with hypoxia (HCC) 04/09/2021  ? Epistaxis 04/09/2021  ? AF (paroxysmal atrial fibrillation) (HCC)   ? Hypothyroidism   ? Hypertension   ? Bronchiectasis without complication (HCC) 06/13/2019  ? ?PCP:  Lynnea Ferrier, MD ?Pharmacy:  No Pharmacies Listed ? ? ? ?Social Determinants of Health (SDOH) Interventions ?  ? ?Readmission Risk Interventions ? ?  11/09/2021  ? 10:12 AM 10/10/2021  ? 10:44 AM  ?Readmission Risk Prevention Plan  ?Transportation Screening Complete Complete  ?PCP or Specialist Appt within 5-7 Days Complete   ?PCP or Specialist Appt within 3-5 Days  Complete  ?Medication Review (RN CM) Complete   ?HRI or Home Care Consult  Complete  ?Social Work Consult for Recovery Care Planning/Counseling  Complete  ?Palliative Care Screening  Not Applicable  ?Medication Review Oceanographer)  Complete  ? ? ? ?

## 2021-11-09 NOTE — Evaluation (Addendum)
Speech Language Pathology Evaluation ?Patient Details ?Name: Antonela Piland Bansal ?MRN: [id number] ?DOB: Oct 21, 1934 ?Today's Date: 11/09/2021 ?Time: [id number] ?SLP Time Calculation (min) (ACUTE ONLY): 20 min ? ?Problem List:  ?Patient Active Problem List  ? Diagnosis Date Noted  ? Acute CVA (cerebrovascular accident) ([place]) 11/07/2021  ? S/P percutaneous endoscopic gastrostomy (PEG) tube placement ([place])   ? Chronic anticoagulation   ? Hemiparesis affecting left side as late effect of cerebrovascular accident (CVA) ([place])   ? Dysphagia due to recent cerebrovascular accident   ? Dysarthria as late effect of cerebrovascular disease   ? Headache 10/09/2021  ? Protein-calorie malnutrition, severe 09/23/2021  ? Hypertensive urgency 09/21/2021  ? History of COVID-19 09/21/2021  ? Lower extremity edema 09/21/2021  ? Hypophosphatemia   ? COPD with acute exacerbation ([place])   ? Hyponatremia 07/01/2021  ? Bronchopneumonia 07/01/2021  ? Pneumonia due to COVID-19 virus 07/01/2021  ? Chronic respiratory failure with hypoxia ([place]) 07/01/2021  ? History of epistaxis 07/01/2021  ? Acute metabolic encephalopathy [id number]  ? Malnutrition of moderate degree 04/10/2021  ? Acute blood loss anemia 04/10/2021  ? Abnormal CT of the abdomen 04/10/2021  ? Acute on chronic respiratory failure with hypoxia ([place]) 04/09/2021  ? Epistaxis 04/09/2021  ? AF (paroxysmal atrial fibrillation) ([place])   ? Hypothyroidism   ? Hypertension   ? Bronchiectasis without complication ([place]) [id number]  ? ?Past Medical History:  ?Past Medical History:  ?Diagnosis Date  ? Atrial fibrillation ([place])   ? Atrial fibrillation ([place])   ? Back pain   ? Dysrhythmia   ? Hypertension   ? Hypothyroidism   ? Stroke [facility])   ? ?Past Surgical History:  ?Past Surgical History:  ?Procedure Laterality Date  ? ABDOMINAL HYSTERECTOMY    ? BREAST EXCISIONAL BIOPSY Right 70's  ? CATARACT EXTRACTION W/ INTRAOCULAR LENS  IMPLANT, BILATERAL    ? KYPHOPLASTY N/A 12/21/2018  ? Procedure: KYPHOPLASTY L1;   Surgeon: Hessie Knows, MD;  Location: [facility] ORS;  Service: Orthopedics;  Laterality: N/A;  ? ?HPI:  ?Per 106 H&P "Simi Bermudes Savona is a 87 y.o. female with medical history significant for A-fib on Xarelto, HTN and anxiety, with recent right MCA infarct on 4/14 with residual dysarthria and left hemiparesis, currently with PEG tube, discharged from [facility] to rehab on 4/24, who was sent to the ED for evaluation weakness in the right arm, and increased weakness in the left arm as well as increased difficulty getting words out and slurred speech.  A code stroke was called from the ED and initial CT head showed no acute intracranial process.  A teleneurology consult was done from the ED.  She was not a thrombolytic candidate due to being on Xarelto and a recent stroke and was not thought to be a thrombectomy candidate as she did not have an LVO syndrome.  CTA head and neck was not thought necessary by teleneurology and MRI/MRA head and neck were ordered with recommendation to hold anticoagulation pending results of MRI/MRA.  Hospitalist is consulted for admission  Additional data review: BP 153/80 in the ED with otherwise normal vitals.  Blood work significant for hyponatremia of 129 but otherwise unremarkable EKG, personally viewed and interpreted showing A-fib at 79 with nonspecific ST-T wave changes.  Discharge summary from recent hospitalization as well as neurology notes reviewed." MRI/MRA 11/08/21 "1. Ill-defined restricted diffusion in the right frontoparietal  region and occipital cortex are favored to represent acute and/or  subacute infarcts.  2. Sequela of recent acute infarcts in  the right frontoparietal and  temporal occipital regions.  3. No intracranial large vessel occlusion or significant stenosis.  4. No hemodynamically significant stenosis in the neck."  ? ?Assessment / Plan / Recommendation ?Clinical Impression ? Pt seen for speech/language/cognitive evaluation. Pt alert, pleasant, and cooperative.  Eager to d/c to SNF for rehab. "I'm making progress. I'm going to rehab today." ? ?Assessment completed via informal means and portions of Cognistat. Pt demonstrated intact basic functional cognitive-linguistic ability including attention, memory, problem solving, abstract reasoning, and insight. Pt with mild dysarthria c/b hoarse vocal quality and minimal articulatory imprecision. Pt >95% intelligible to unfamiliar listener. x1 instance of communication breakdown during conversational exchanges; pt able to repair with cues for slowed speech rate and increased vocal loudness. Pt stated her speech "isn't normal" but "back to her new normal." ? ?Pt endorses received SLP services for speech and swallowing at SNF. Pt noted with with dysarthria and dysphagia since recent stroke. Per chart review and pt's report, pt appears PEG for nutrition/hydration/medication and pt has not been started on oral diet since stroke. MBSS completed on 10/26/21 (see EMR for details). No current consult for dysphagia this admission.  ? ?Pt with pending d/c to SNF this date. Recommend resuming SLP services at d/c for speech and swallowing tx with consideration for instrumental swallowing evaluation prior to initiation of PO diet.  ? ?SLP to sign off at this level of care given the above. Pt and RN made aware of results, recommendations, and SLP POC.  ?   ?SLP Assessment ? SLP Recommendation/Assessment: All further Speech Lanaguage Pathology  needs can be addressed in the next venue of care (pt with pending d/c back to SNF this date) ?SLP Visit Diagnosis: Dysarthria and anarthria (R47.1) (hx of oropharyngeal dysphagia per EMR)  ?  ?Recommendations for follow up therapy are one component of a multi-disciplinary discharge planning process, led by the attending physician.  Recommendations may be updated based on patient status, additional functional criteria and insurance authorization. ?   ?Follow Up Recommendations ? Skilled nursing-short term  rehab (<3 hours/day)  ?  ?Assistance Recommended at Discharge ? Intermittent Supervision/Assistance  ?Functional Status Assessment Patient has had a recent decline in their functional status and demonstrates the ability to make significant improvements in function in a reasonable and predictable amount of time.  ?Frequency and Duration  (TBD at next level of care)  ?  ?  ?   ?SLP Evaluation ?Cognition ? Overall Cognitive Status: Within Functional Limits for tasks assessed ?Orientation Level: Oriented X4 ?Memory: Appears intact ?Awareness: Appears intact ?Problem Solving: Appears intact ?Safety/Judgment: Appears intact  ?  ?   ?Comprehension ? Auditory Comprehension ?Overall Auditory Comprehension: Appears within functional limits for tasks assessed ?Visual Recognition/Discrimination ?Discrimination: Not tested ?Reading Comprehension ?Reading Status: Not tested  ?  ?Expression Expression ?Primary Mode of Expression: Verbal ?Verbal Expression ?Overall Verbal Expression: Appears within functional limits for tasks assessed ?Initiation: No impairment ?Automatic Speech: Social Response [facility]) ?Level of Generative/Spontaneous Verbalization: Conversation [facility]) ?Naming: No impairment ?Pragmatics: No impairment ?Interfering Components: Speech intelligibility ?Written Expression ?Dominant Hand: Left ?Written Expression: Not tested   ?Oral / Motor ? Oral Motor/Sensory Function ?Overall Oral Motor/Sensory Function: Mild impairment ?Facial ROM: Reduced left ?Facial Symmetry: Abnormal symmetry left;Suspected CN VII (facial) dysfunction ?Lingual ROM: Reduced left ?Lingual Symmetry: Within Functional Limits ?Lingual Strength: Reduced;Suspected CN XII (hypoglossal) dysfunction ?Motor Speech ?Overall Motor Speech: Impaired ?Respiration: Within functional limits ?Phonation: Hoarse ?Resonance: Within functional limits ?Articulation: Impaired (minimal articulatory imprecision) ?Intelligibility: Intelligibility  reduced ?Word:  (x1 instance  of communication breakdown during conversational exchanges; pt able to repair with cues for slowed speech rate and increased vocal loudness) ?Conversation: 75-100% accurate ?Motor Planning: Witnin functio

## 2021-11-09 NOTE — Discharge Summary (Signed)
? ?Physician Discharge Summary ? ? ?Kaylee Santiago  female DOB: [birth year]  ?[id number] ? ?PCP: Lynnea FerrierKlein, Bert J III, MD ? ?Admit date: 11/07/2021 ?Discharge date: 11/09/2021 ? ?Admitted From: SNF rehab ?Disposition:  SNF ?CODE STATUS: Full code ? ? ?Hospital Course:  ?For full details, please see H&P, progress notes, consult notes and ancillary notes.  ?Briefly,  ?Kaylee GlowHazel T Santiago is a 87 y.o. female with medical history significant for A-fib on Xarelto, HTN and anxiety, with recent right MCA infarct on 4/14 with residual dysarthria and left hemiparesis, currently with PEG tube, [facility], who was sent to the ED for evaluation weakness in the right arm, and increased weakness in the left arm as well as increased difficulty getting words out and slurred speech.  A code stroke was called from the ED and initial CT head showed no acute intracranial process.  A teleneurology consult was done from the ED.  ? ?* Recent CVA (cerebrovascular accident) (H[facility]) ?--Neuro consulted, and per neuro, MRI brain showed ill-defined restricted diffusion in R frontoparietal and occipital cortex favored to represent subacute infarcts, likely sequelae of recent infarcts in same region. ?--BP meds were held for permissive HTN and resumed after discharge. ?--Home Xarelto was held and resumed when cleared by neuro.  ? ?AF (paroxysmal atrial fibrillation) (H[facility]) ?Chronic anticoagulation ?--Home Xarelto was held and resumed when cleared by neuro.  ?  ?Hyponatremia ?Chronic and at baseline. ?--resume home salt tablets after discharge. ?  ?Hypertension ?--Home BP meds were held on presentation for permissive HTN, and BP was mostly wnl during hospitalization. ?--at discharge, home Lisinopril 10 mg changed to daily (from BID) and home Lopressor 25 mg changed to BID (from q6h) ?  ?Hypothyroidism ?Continue levothyroxine ?  ?Dysarthria as late effect of cerebrovascular disease ?Continue speech therapy ?  ?Dysphagia due to recent  cerebrovascular accident ?Cont Speech therapy  ?  ?Hemiparesis affecting left side as late effect of cerebrovascular accident (CVA) (H[facility]) ?Increase nursing assistance for transfers ?Fall precautions ?  ?S/P percutaneous endoscopic gastrostomy (PEG) tube placement (H[facility]) ?Secondary to dysarthria from recent CVA ?--cont tube feed ?--keep NPO ? ? ?Discharge Diagnoses:  ?Principal Problem: ?  Acute CVA (cerebrovascular accident) (H[facility]) ?Active Problems: ?  AF (paroxysmal atrial fibrillation) (H[facility]) ?  Chronic anticoagulation ?  Hyponatremia ?  Hypertension ?  Hypothyroidism ?  S/P percutaneous endoscopic gastrostomy (PEG) tube placement ([facility]) ?  Hemiparesis affecting left side as late effect of cerebrovascular accident (CVA) (H[facility]) ?  Dysphagia due to recent cerebrovascular accident ?  Dysarthria as late effect of cerebrovascular disease ? ? ?30 Day Unplanned Readmission Risk Score   ? ?Flowsheet Row ED to Hosp-Admission (Current) from 11/07/2021 in [facility] 1C MEDICAL TELEMETRY  ?30 Day Unplanned Readmission Risk Score (%) 22.28 Filed at 11/09/2021 0400  ? ?  ? ? This score is the patient's risk of an unplanned readmission within 30 days of being discharged (0 -100%). The score is based on dignosis, age, lab data, medications, orders, and past utilization.   ?Low:  0-14.9   Medium: 15-21.9   High: 22-29.9   Extreme: 30 and above ? ?  ? ?  ? ? ?Discharge Instructions: ? ?Allergies as of 11/09/2021   ? ?   Reactions  ? Amoxicillin-pot Clavulanate Diarrhea  ? Levofloxacin Diarrhea  ? Trazodone Other (See Comments)  ? nightmares  ? ?  ? ?  ?Medication List  ?  ? ?STOP taking these medications   ? ?  Biotin 5000 MCG Tabs ?  ?busPIRone 5 MG tablet ?Commonly known as: BUSPAR ?  ?CALTRATE 600+D3 PO ?  ?cloNIDine 0.1 MG tablet ?Commonly known as: CATAPRES ?  ?loratadine 10 MG tablet ?Commonly known as: CLARITIN ?  ?metoprolol succinate 50 MG 24 hr tablet ?Commonly known as: TOPROL-XL ?  ?MULTI-VITAMIN DAILY PO ?   ?Probiotic 250 MG Caps ?  ?vitamin E 180 MG (400 UNITS) capsule ?  ? ?  ? ?TAKE these medications   ? ?acetaminophen 325 MG tablet ?Commonly known as: TYLENOL ?Place 650 mg into feeding tube every 6 (six) hours as needed. ?  ?atorvastatin 40 MG tablet ?Commonly known as: LIPITOR ?Place 40 mg into feeding tube daily. ?  ?Culturelle Caps ?Take 1 capsule by mouth daily. ?  ?hydrocortisone 2.5 % cream ?Apply 1 application. topically 2 (two) times daily. Apply to upper back. ?  ?levothyroxine 75 MCG tablet ?Commonly known as: SYNTHROID ?Take 1 tablet by mouth daily. ?  ?lisinopril 10 MG tablet ?Commonly known as: ZESTRIL ?Take 1 tablet (10 mg total) by mouth daily. ?What changed: when to take this ?  ?LORazepam 0.5 MG tablet ?Commonly known as: ATIVAN ?Take 0.5 mg by mouth at bedtime as needed for sleep. ?  ?melatonin 3 MG Tabs tablet ?Place 3 mg into feeding tube at bedtime. ?  ?metoprolol tartrate 25 MG tablet ?Commonly known as: LOPRESSOR ?Place 1 tablet (25 mg total) into feeding tube 2 (two) times daily. ?What changed: when to take this ?  ?polyethylene glycol powder 17 GM/SCOOP powder ?Commonly known as: GLYCOLAX/MIRALAX ?Place 17 g into feeding tube. ?  ?senna 8.6 MG tablet ?Commonly known as: SENOKOT ?Place 8.6 mg into feeding tube at bedtime. ?  ?sodium chloride 1 g tablet ?Take 1 tablet (1 g total) by mouth 2 (two) times daily with a meal. ?  ?Xarelto 20 MG Tabs tablet ?Generic drug: rivaroxaban ?Take 20 mg by mouth daily. ?  ? ?  ? ? ? Follow-up Information   ? ? Lynnea Ferrier, MD Follow up.   ?Specialty: Internal Medicine ?Contact information: ?[address] ?[place] [address] ?[phone] ? ? ?  ?  ? ?  ?  ? ?  ? ? ?Allergies  ?Allergen Reactions  ? Amoxicillin-Pot Clavulanate Diarrhea  ? Levofloxacin Diarrhea  ? Trazodone Other (See Comments)  ?  nightmares  ? ? ? ?The results of significant diagnostics from this hospitalization (including imaging, microbiology, ancillary and laboratory) are  listed below for reference.  ? ?Consultations: ? ? ?Procedures/Studies: ?CT HEAD WO CONTRAST ( ) ? ?Result Date: 10/20/2021 ?CLINICAL DATA:  Mental status change EXAM: CT HEAD WITHOUT CONTRAST TECHNIQUE: Contiguous axial images were obtained from the base of the skull through the vertex without intravenous contrast. RADIATION DOSE REDUCTION: This exam was performed according to the departmental dose-optimization program which includes automated exposure control, adjustment of the mA and/or kV according to patient size and/or use of iterative reconstruction technique. COMPARISON:  CT head MRI head 10/15/2021 FINDINGS: Brain: Mild generalized atrophy without hydrocephalus. Moderate white matter hypodensity diffusely, unchanged. Negative for acute infarct, hemorrhage, mass Vascular: Negative for hyperdense vessel Skull: Negative Sinuses/Orbits: Paranasal sinuses clear. Bilateral cataract extraction Other: None IMPRESSION: Generalized atrophy with moderate chronic microvascular ischemia No acute abnormality no change from recent CT and MRI. Electronically Signed   By: Marlan Palau M.D.   On: 10/20/2021 17:05  ? ?CT HEAD WO CONTRAST ? ?Result Date: 10/15/2021 ?CLINICAL DATA:  Headache, dizziness EXAM: CT HEAD  WITHOUT CONTRAST TECHNIQUE: Contiguous axial images were obtained from the base of the skull through the vertex without intravenous contrast. RADIATION DOSE REDUCTION: This exam was performed according to the departmental dose-optimization program which includes automated exposure control, adjustment of the mA and/or kV according to patient size and/or use of iterative reconstruction technique. COMPARISON:  Head CT 10/08/2021 FINDINGS: Brain: There is no evidence of acute intracranial hemorrhage, extra-axial fluid collection, or acute infarct. Parenchymal volume is normal for age. The ventricles are normal in size. Gray-white differentiation is preserved. Patchy hypodensity throughout the subcortical and  periventricular white matter is unchanged, likely reflecting sequela of chronic white matter microangiopathy. There is no solid mass lesion. There is no mass effect or midline shift. Vascular: There is calcificati

## 2021-11-09 NOTE — Progress Notes (Signed)
Physical Therapy Treatment ?Patient Details ?Name: Kaylee Santiago ?MRN: [id number] ?DOB: 1935/04/01 ?Today's Date: 11/09/2021 ? ? ?History of Present Illness Pt is an 87 y/o F admitted on 11/07/21 after presenting to the ED for evaluation of RUE weakness, increased weakness in LUE, & increased difficulty speaking. MRI reveals Ill-defined restricted diffusion in the right frontoparietal  region and occipital cortex are favored to represent acute and/or  subacute infarcts, Sequela of recent acute infarcts in the right frontoparietal & temporal occipital regions. PMH: a-fib on xarelto, HTN, anxiety, R MCA infarct 4/14 with residual dysarthria & L hemiparesis, PEG tube ? ?  ?PT Comments  ? ? Pt seen for PT tx with pt agreeable. Pt is able to complete bed mobility with mod I with HOB elevated & STS transfers with distant supervision from EOB & toilet. Pt ambulates farther today compared to yesterday with slightly improved balance. Pt does have elevated HR during session & does endorse anxiety with mobility/situation. PT provides encouragement throughout session & rest breaks PRN. Pt would continue to benefit from skilled PT services to address endurance, balance, LUE/LLE NMR, & gait. ?   ?Recommendations for follow up therapy are one component of a multi-disciplinary discharge planning process, led by the attending physician.  Recommendations may be updated based on patient status, additional functional criteria and insurance authorization. ? ?Follow Up Recommendations ? Skilled nursing-short term rehab (<3 hours/day) ?  ?  ?Assistance Recommended at Discharge Intermittent Supervision/Assistance  ?Patient can return home with the following A little help with walking and/or transfers;A little help with bathing/dressing/bathroom;Assistance with cooking/housework;Assist for transportation;Help with stairs or ramp for entrance ?  ?Equipment Recommendations ? None recommended by PT  ?  ?Recommendations for Other Services   ? ? ?   ?Precautions / Restrictions Precautions ?Precautions: Fall ?Precaution Comments: PEG tube ?Restrictions ?Weight Bearing Restrictions: No  ?  ? ?Mobility ? Bed Mobility ?Overal bed mobility: Needs Assistance ?Bed Mobility: Supine to Sit, Sit to Supine ?  ?  ?Supine to sit: Modified independent (Device/Increase time), HOB elevated ?Sit to supine: HOB elevated, Modified independent (Device/Increase time) ?  ?  ?  ? ?Transfers ?Overall transfer level: Needs assistance ?Equipment used: None ?Transfers: Sit to/from Stand ?Sit to Stand: Supervision ?  ?  ?  ?  ?  ?  ?  ? ?Ambulation/Gait ?Ambulation/Gait assistance: Min guard ?Gait Distance (Feet): 85 Feet ?Assistive device: None ?Gait Pattern/deviations: Decreased stride length ?Gait velocity: decreased ?  ?  ?General Gait Details: Decreased LUE reciprocal arm swing. ? ? ?Stairs ?  ?  ?  ?  ?  ? ? ?Wheelchair Mobility ?  ? ?Modified Rankin (Stroke Patients Only) ?  ? ? ?  ?Balance Overall balance assessment: Needs assistance ?Sitting-balance support: Feet supported ?Sitting balance-Leahy Scale: Good ?  ?  ?Standing balance support: No upper extremity supported, During functional activity ?Standing balance-Leahy Scale: Fair ?  ?  ?  ?  ?  ?  ?  ?  ?  ?  ?  ?  ?  ? ?  ?Cognition Arousal/Alertness: Awake/alert ?Behavior During Therapy: [facility] for tasks assessed/performed ?Overall Cognitive Status: Within Functional Limits for tasks assessed ?  ?  ?  ?  ?  ?  ?  ?  ?  ?  ?  ?  ?  ?  ?  ?  ?General Comments: Very pleasant & motivated lady, endorses feeling anxious ?  ?  ? ?  ?Exercises   ? ?  ?General Comments  General comments (skin integrity, edema, etc.): Pt with continent void on toilet, performing peri hygiene & hand hygiene without assistance. Pt with limited use of LUE with PT educating pt to incorporate LUE into functional tasks as much as possible. HR 103-161 bpm (HR does decrease after elevation, pt endorses anxiety). ?  ?  ? ?Pertinent Vitals/Pain Pain Assessment ?Pain  Assessment: No/denies pain  ? ? ?Home Living   ?  ?Available Help at Discharge: Family;Available PRN/intermittently ?Type of Home: House ?  ?  ?  ?  ?  ?  ?   ?  ?Prior Function    ?  ?  ?   ? ?PT Goals (current goals can now be found in the care plan section) Acute Rehab PT Goals ?Patient Stated Goal: get better, return to PLOF ?PT Goal Formulation: With patient ?Time For Goal Achievement: 11/22/21 ?Potential to Achieve Goals: Good ?Progress towards PT goals: Progressing toward goals ? ?  ?Frequency ? ? ? 7X/week ? ? ? ?  ?PT Plan Current plan remains appropriate  ? ? ?Co-evaluation   ?  ?  ?  ?  ? ?  ?AM-PAC PT "6 Clicks" Mobility   ?Outcome Measure ? Help needed turning from your back to your side while in a flat bed without using bedrails?: None ?Help needed moving from lying on your back to sitting on the side of a flat bed without using bedrails?: None ?Help needed moving to and from a bed to a chair (including a wheelchair)?: A Little ?Help needed standing up from a chair using your arms (e.g., wheelchair or bedside chair)?: A Little ?Help needed to walk in hospital room?: A Little ?Help needed climbing 3-5 steps with a railing? : A Little ?6 Click Score: 20 ? ?  ?End of Session   ?Activity Tolerance: Patient tolerated treatment well ?Patient left: in chair;with call bell/phone within reach;with bed alarm set ?  ?PT Visit Diagnosis: Muscle weakness (generalized) (M62.81);Unsteadiness on feet (R26.81) ?  ? ? ?Time: [id number] ?PT Time Calculation (min) (ACUTE ONLY): 23 min ? ?Charges:  $Therapeutic Activity: 8-22 mins ?$Neuromuscular Re-education: 8-22 mins          ?          ? ?Lavone Nian, PT, DPT ?11/09/21, 10:03 AM ? ? ?[name] ?11/09/2021, 10:02 AM ? ?

## 2021-11-09 NOTE — [place] FL2 (Signed)
? MEDICAID FL2 LEVEL OF CARE SCREENING TOOL  ?  ? ?IDENTIFICATION  ?Patient Name: ?Kaylee Santiago Birthdate: 1934-08-23 Sex: female Admission Date (Current Location): ?11/07/2021  ?[place] Number: ? [name] ?  Facility and Address:  ?[facility], [address] [place], [place] [address] ?     Provider Number: ?[id number]  ?Attending Physician Name and Address:  ?Darlin Priestly, MD ? Relative Name and Phone Number:  ?[name] (Niece)   [phone] (Mobile) ?   ?Current Level of Care: ?Hospital Recommended Level of Care: ?Skilled Nursing Facility Prior Approval Number: ?  ? ?Date Approved/Denied: ?  PASRR Number: ?[id number] A ? ?Discharge Plan: ?SNF ?  ? ?Current Diagnoses: ?Patient Active Problem List  ? Diagnosis Date Noted  ? Acute CVA (cerebrovascular accident) (HCC) 11/07/2021  ? S/P percutaneous endoscopic gastrostomy (PEG) tube placement (HCC)   ? Chronic anticoagulation   ? Hemiparesis affecting left side as late effect of cerebrovascular accident (CVA) (HCC)   ? Dysphagia due to recent cerebrovascular accident   ? Dysarthria as late effect of cerebrovascular disease   ? Headache 10/09/2021  ? Protein-calorie malnutrition, severe 09/23/2021  ? Hypertensive urgency 09/21/2021  ? History of COVID-19 09/21/2021  ? Lower extremity edema 09/21/2021  ? Hypophosphatemia   ? COPD with acute exacerbation (HCC)   ? Hyponatremia 07/01/2021  ? Bronchopneumonia 07/01/2021  ? Pneumonia due to COVID-19 virus 07/01/2021  ? Chronic respiratory failure with hypoxia (HCC) 07/01/2021  ? History of epistaxis 07/01/2021  ? Acute metabolic encephalopathy 07/01/2021  ? Malnutrition of moderate degree 04/10/2021  ? Acute blood loss anemia 04/10/2021  ? Abnormal CT of the abdomen 04/10/2021  ? Acute on chronic respiratory failure with hypoxia (HCC) 04/09/2021  ? Epistaxis 04/09/2021  ? AF (paroxysmal atrial fibrillation) (HCC)   ? Hypothyroidism   ? Hypertension   ? Bronchiectasis  without complication (HCC) 06/13/2019  ? ? ?Orientation RESPIRATION BLADDER Height & Weight   ?  ?Time, [place], Situation, Place ? Normal Continent Weight: 48.1 kg ?Height:  5\' 10"  (177.8 cm)  ?BEHAVIORAL SYMPTOMS/MOOD NEUROLOGICAL BOWEL NUTRITION STATUS  ?    Continent Feeding tube  ?AMBULATORY STATUS COMMUNICATION OF NEEDS Skin   ?Limited Assist Verbally Normal ?  ?  ?  ?    ?     ?     ? ? ?Personal Care Assistance Level of Assistance  ?Bathing, Feeding, Dressing Bathing Assistance: Limited assistance ?Feeding assistance: Limited assistance ?Dressing Assistance: Limited assistance ?   ? ?Functional Limitations Info  ?Sight, Hearing, Speech Sight Info: Adequate ?Hearing Info: Adequate ?Speech Info: Adequate  ? ? ?SPECIAL CARE FACTORS FREQUENCY  ?OT (By licensed OT), PT (By licensed PT)   ?  ?  ?  ?  ?  ?  ?   ? ? ?Contractures    ? ? ?Additional Factors Info  ?Code Status, Allergies Code Status Info: FULL CODE ?Allergies Info: Amoxicillin-pot Clavulanate Medium  Diarrhea   Levofloxacin Medium  Diarrhea   Trazodone ?  ?  ?  ?   ? ?Current Medications (11/09/2021):  This is the current hospital active medication list ?Current Facility-Administered Medications  ?Medication Dose Route Frequency Provider Last Rate Last Admin  ? acetaminophen (TYLENOL) tablet 650 mg  650 mg Oral Q4H PRN 01/09/2022, MD   650 mg at 11/08/21 1358  ? Or  ? acetaminophen (TYLENOL) 160 MG/5ML solution 650 mg  650 mg Per Tube Q4H PRN 11/10/21, MD      ?  Or  ? acetaminophen (TYLENOL) suppository 650 mg  650 mg Rectal Q4H PRN Andris Baumann, MD      ? feeding supplement (OSMOLITE 1.2 CAL) liquid 1,000 mL  1,000 mL Per Tube Continuous Darlin Priestly, MD 50 mL/hr at 11/09/21 0759 1,000 mL at 11/09/21 0759  ? levothyroxine (SYNTHROID) tablet 75 mcg  75 mcg Per Tube [id number] Darlin Priestly, MD   75 mcg at 11/09/21 0617  ? LORazepam (ATIVAN) tablet 0.5 mg  0.5 mg Per Tube QHS PRN Darlin Priestly, MD   0.5 mg at 11/08/21 2216  ? melatonin tablet 5 mg  5 mg Per  Tube QHS Darlin Priestly, MD   5 mg at 11/08/21 2213  ? Rivaroxaban (XARELTO) tablet 15 mg  15 mg Per Tube Q supper Jefferson Fuel, MD      ? ? ? ?Discharge Medications: ?Please see discharge summary for a list of discharge medications. ? ?Relevant Imaging Results: ? ?Relevant Lab Results: ? ? ?Additional Information ?SSN [id number] ? ?Caryn Section, RN ? ? ? ? ?

## 2021-11-09 NOTE — Progress Notes (Signed)
Patient ID: 87 y.o. female with medical history significant for A-fib on Xarelto, HTN and anxiety, with recent right MCA infarct on 4/14 with residual dysarthria and left hemiparesis, currently with PEG tube, discharged from [facility] to rehab on 4/24, who was sent to the ED for evaluation weakness in the right arm, and increased weakness in the left arm as well as increased difficulty getting words out and slurred speech.  She was seen as telestroke overnight by teleneurology. TNK not administered 2/2 contraindication of therapeutic anticoagulation. ? ?S: Patient's sx have resolved to recent baseline. No new neurologic complaints today. ? ?MRI brain showed ill-defined restricted diffusion in R frontoparietal and occipital cortex favored to represent subacute infarcts, likely sequelae of recent infarcts in same region (personal review) ? ?MRA H&N - no hemodynamically significant stenosis ? ?O: ? ?Vitals:  ? 11/08/21 2030 11/09/21 0501  ?BP: (!) 143/58 125/67  ?Pulse: 85 69  ?Resp: 20 20  ?Temp: 98.7 ?F (37.1 ?C) 97.6 ?F (36.4 ?C)  ?SpO2: 100% 95%  ? ? ?Physical Exam ?Gen: A&Ox4, NAD ?HEENT: Atraumatic, normocephalic; oropharynx clear, tongue without atrophy or fasciculations. ?Resp: CTAB, normal work of breathing ?CV: RRR, extremities appear well-perfused. ?Abd: soft/NT/ND ?Extrem: Nml bulk; no cyanosis, clubbing, or edema. ? ?Neuro: ?*MS: A&O x4. Follows multi-step commands.  ?*Speech: mild dysarthria and mild WFD ?*CN:  ?  I: Deferred ?  II,III: PERRLA, VFF by confrontation, optic discs not visualized 2/2 pupillary constriction ?  III,IV,VI: EOMI w/o nystagmus, no ptosis ?  V: Sensation intact from V1 to V3 to LT ?  VII: Eyelid closure was full.  L UMN facial droop ?  VIII: Hearing intact to voice ?  IX,X: Voice normal, palate elevates symmetrically  ?  XI: SCM/trap 5/5 bilat   ?XII: Tongue protrudes midline, no atrophy or fasciculations  ?*Motor:   Normal bulk.  No tremor, rigidity or bradykinesia. Drift LUE and LLE  but not to bed. RUE and RLE full strength ?*Sensory: Impaired to LT on L. No double-simultaneous extinction.  ?*Coordination:  FNF intact ?*Reflexes:  2+ and symmetric throughout without clonus; toes down-going bilat ?*Gait: deferred ? ?NIHSS ?1a Level of Conscious.: 0 ?1b LOC Questions: 0 ?1c LOC Commands: 0 ?2 Best Gaze: 0 ?3 Visual: 0 ?4 Facial Palsy: 1 ?5a Motor Arm - left: 1 ?5b Motor Arm - Right: 0 ?6a Motor Leg - Left: 1 ?6b Motor Leg - Right: 0 ?7 Limb Ataxia: 0 ?8 Sensory: 1 ?9 Best Language: 1 ?10 Dysarthria: 0 ?11 Extinct. and Inatten.: 0 ? ?TOTAL: 5 ? ?Premorbid mRS = 2 ? ?A/P: 87 y.o. female with medical history significant for A-fib on Xarelto, HTN and anxiety, with recent right MCA infarct on 4/14 with residual dysarthria and left hemiparesis, currently with PEG tube, discharged from [facility] to rehab on 4/24, who was sent to the ED for evaluation weakness in the right arm, and increased weakness in the left arm as well as increased difficulty getting words out and slurred speech.  She was seen as telestroke overnight by teleneurology. TNK not administered 2/2 contraindication of therapeutic anticoagulation. Given resolution of patient's sx they are most likely 2/2 recrudescence of prior stroke sx from recent acute infarcts. I suspect that her MRI findings are residual from her recent known infarcts and do not represent additional ischemic events. ? ?- No further stroke workup indicated at this time ?- OK to restart xarelto home dose 20mg  daily ?- D/c aspirin ?- OK to d/c from neuro standpoint when cleared  for return to [facility] by PT ?- F/u with [facility] neurology as scheduled ? ?Su Monks, MD ?[facility] ?[phone] ? ?If 7pm- 7am, please page neurology on call as listed in [place]. ? ?

## 2021-11-09 NOTE — NC FL2 (Signed)
?[place] MEDICAID FL2 LEVEL OF CARE SCREENING TOOL  ?  ? ?IDENTIFICATION  ?Patient Name: ?Kaylee Santiago Birthdate: 06/04/1935 Sex: female Admission Date (Current Location): ?11/07/2021  ?[place] Number: ? [place] ?  Facility and Address:  ?[facility], [address], [place], [place] [address] ?     Provider Number: ?[id number]  ?Attending Physician Name and Address:  ?Darlin Priestly, MD ? Relative Name and Phone Number:  ?Kaylee Santiago (Niece)   [phone] (Mobile) ?   ?Current Level of Care: ?Hospital Recommended Level of Care: ?Skilled Nursing Facility Prior Approval Number: ?  ? ?Date Approved/Denied: ?  PASRR Number: ?[id number] A ? ?Discharge Plan: ?SNF ?  ? ?Current Diagnoses: ?Patient Active Problem List  ? Diagnosis Date Noted  ? Acute CVA (cerebrovascular accident) (HCC) 11/07/2021  ? S/P percutaneous endoscopic gastrostomy (PEG) tube placement (HCC)   ? Chronic anticoagulation   ? Hemiparesis affecting left side as late effect of cerebrovascular accident (CVA) (HCC)   ? Dysphagia due to recent cerebrovascular accident   ? Dysarthria as late effect of cerebrovascular disease   ? Headache 10/09/2021  ? Protein-calorie malnutrition, severe 09/23/2021  ? Hypertensive urgency 09/21/2021  ? History of COVID-19 09/21/2021  ? Lower extremity edema 09/21/2021  ? Hypophosphatemia   ? COPD with acute exacerbation (HCC)   ? Hyponatremia 07/01/2021  ? Bronchopneumonia 07/01/2021  ? Pneumonia due to COVID-19 virus 07/01/2021  ? Chronic respiratory failure with hypoxia (HCC) 07/01/2021  ? History of epistaxis 07/01/2021  ? Acute metabolic encephalopathy 07/01/2021  ? Malnutrition of moderate degree 04/10/2021  ? Acute blood loss anemia 04/10/2021  ? Abnormal CT of the abdomen 04/10/2021  ? Acute on chronic respiratory failure with hypoxia (HCC) 04/09/2021  ? Epistaxis 04/09/2021  ? AF (paroxysmal atrial fibrillation) (HCC)   ? Hypothyroidism   ? Hypertension   ? Bronchiectasis  without complication (HCC) 06/13/2019  ? ? ?Orientation RESPIRATION BLADDER Height & Weight   ?  ?Time, [place], Situation, Place ? Normal   Weight: 48.1 kg ?Height:  5\' 10"  (177.8 cm)  ?BEHAVIORAL SYMPTOMS/MOOD NEUROLOGICAL BOWEL NUTRITION STATUS  ?      Diet  ?AMBULATORY STATUS COMMUNICATION OF NEEDS Skin   ?Limited Assist Verbally   ?  ?  ?  ?    ?     ?     ? ? ?Personal Care Assistance Level of Assistance  ?Bathing, Feeding, Dressing Bathing Assistance: Limited assistance ?Feeding assistance: Limited assistance ?Dressing Assistance: Limited assistance ?   ? ?Functional Limitations Info  ?Sight, Hearing, Speech Sight Info: Adequate ?Hearing Info: Adequate ?Speech Info: Adequate  ? ? ?SPECIAL CARE FACTORS FREQUENCY  ?OT (By licensed OT), PT (By licensed PT)   ?  ?  ?  ?  ?  ?  ?   ? ? ?Contractures    ? ? ?Additional Factors Info  ?Code Status, Allergies Code Status Info: FULL CODE ?  ?  ?  ?  ?   ? ?Current Medications (11/09/2021):  This is the current hospital active medication list ?Current Facility-Administered Medications  ?Medication Dose Route Frequency Provider Last Rate Last Admin  ? acetaminophen (TYLENOL) tablet 650 mg  650 mg Oral Q4H PRN 01/09/2022, MD   650 mg at 11/08/21 1358  ? Or  ? acetaminophen (TYLENOL) 160 MG/5ML solution 650 mg  650 mg Per Tube Q4H PRN 11/10/21, MD      ? Or  ? acetaminophen (TYLENOL) suppository 650 mg  650 mg Rectal Q4H PRN Andris Baumann, MD      ? feeding supplement (OSMOLITE 1.2 CAL) liquid 1,000 mL  1,000 mL Per Tube Continuous Darlin Priestly, MD 50 mL/hr at 11/09/21 0759 1,000 mL at 11/09/21 0759  ? levothyroxine (SYNTHROID) tablet 75 mcg  75 mcg Per Tube [id number] Darlin Priestly, MD   75 mcg at 11/09/21 0617  ? LORazepam (ATIVAN) tablet 0.5 mg  0.5 mg Per Tube QHS PRN Darlin Priestly, MD   0.5 mg at 11/08/21 2216  ? melatonin tablet 5 mg  5 mg Per Tube QHS Darlin Priestly, MD   5 mg at 11/08/21 2213  ? Rivaroxaban (XARELTO) tablet 15 mg  15 mg Per Tube Q supper Jefferson Fuel, MD       ? ? ? ?Discharge Medications: ?Please see discharge summary for a list of discharge medications. ? ?Relevant Imaging Results: ? ?Relevant Lab Results: ? ? ?Additional Information ?  ? ?Caryn Section, RN ? ? ? ? ?

## 2022-01-10 DIAGNOSIS — Z5321 Procedure and treatment not carried out due to patient leaving prior to being seen by health care provider: Secondary | ICD-10-CM | POA: Diagnosis not present

## 2022-01-10 DIAGNOSIS — M549 Dorsalgia, unspecified: Secondary | ICD-10-CM | POA: Insufficient documentation

## 2022-01-10 NOTE — ED Triage Notes (Signed)
FIRST NURSE NOTE:  Pt arrived via [facility] from [facility] with reports of chronic back pain and hx of osteoporosis

## 2022-01-11 ENCOUNTER — Emergency Department
Admission: EM | Admit: 2022-01-11 | Discharge: 2022-01-11 | Discharge status: Against Medical Advice | Payer: Medicare Other | Attending: Emergency Medicine | Admitting: Emergency Medicine

## 2022-01-11 NOTE — ED Notes (Signed)
RN attempted to triage pt. Pt expressed that she did not want to wait for a room. Pt declined being triaged and called a family member to come pick her up from the ED. Pt waiting on ride in the waiting room.

## 2022-01-12 ENCOUNTER — Other Ambulatory Visit: Payer: Self-pay

## 2022-01-12 ENCOUNTER — Emergency Department
Admission: EM | Admit: 2022-01-12 | Discharge: 2022-01-12 | Disposition: A | Discharge status: Home / Self Care | Payer: Medicare Other | Attending: Emergency Medicine | Admitting: Emergency Medicine

## 2022-01-12 ENCOUNTER — Emergency Department: Payer: Medicare Other

## 2022-01-12 DIAGNOSIS — E039 Hypothyroidism, unspecified: Secondary | ICD-10-CM | POA: Insufficient documentation

## 2022-01-12 DIAGNOSIS — M5431 Sciatica, right side: Secondary | ICD-10-CM

## 2022-01-12 DIAGNOSIS — I10 Essential (primary) hypertension: Secondary | ICD-10-CM | POA: Insufficient documentation

## 2022-01-12 DIAGNOSIS — S32030A Wedge compression fracture of third lumbar vertebra, initial encounter for closed fracture: Secondary | ICD-10-CM | POA: Diagnosis not present

## 2022-01-12 DIAGNOSIS — X58XXXA Exposure to other specified factors, initial encounter: Secondary | ICD-10-CM | POA: Diagnosis not present

## 2022-01-12 DIAGNOSIS — M5441 Lumbago with sciatica, right side: Secondary | ICD-10-CM | POA: Insufficient documentation

## 2022-01-12 DIAGNOSIS — S3992XA Unspecified injury of lower back, initial encounter: Secondary | ICD-10-CM | POA: Diagnosis present

## 2022-01-12 MED ORDER — OXYCODONE HCL 5 MG PO TABS
5.0000 mg | ORAL_TABLET | Freq: Once | ORAL | Status: AC
  Administered 2022-01-12: 5 mg via ORAL
  Filled 2022-01-12: qty 1

## 2022-01-12 MED ORDER — IBUPROFEN 600 MG PO TABS
600.0000 mg | ORAL_TABLET | Freq: Once | ORAL | Status: AC
Start: 2022-01-12 — End: 2022-01-12
  Administered 2022-01-12: 600 mg via ORAL
  Filled 2022-01-12: qty 1

## 2022-01-12 MED ORDER — OXYCODONE HCL 5 MG PO TABS
5.0000 mg | ORAL_TABLET | Freq: Two times a day (BID) | ORAL | 0 refills | Status: DC | PRN
Start: 2022-01-12 — End: 2022-01-19

## 2022-01-12 MED ORDER — METHYLPREDNISOLONE 4 MG PO TBPK: ORAL_TABLET | ORAL | 0 refills | Status: DC

## 2022-01-12 NOTE — Discharge Instructions (Addendum)
Please call and schedule a follow-up appointment with Dr. Myer Haff.  Take the steroid as prescribed and until finished.  Take the pain medication for severe pain. Be careful when standing or changing positions as it may cause you to feel dizzy. I recommend having someone with you when you need to get up and when you are walking.   Also, drink plenty of water to prevent constipation.

## 2022-01-12 NOTE — ED Notes (Signed)
Pt is up to the bathroom with assistance ambulates slowly

## 2022-01-12 NOTE — ED Provider Notes (Signed)
[facility] Provider Note    Event Date/Time   First MD Initiated Contact with Patient 01/12/22 0715     (approximate)   History   Back Pain   HPI  Kaylee Santiago is a 87 y.o. female presenting to the emergency department from [facility] due to acute on chronic low back pain.  Pain has significantly increased over the past 24 hours.  No new injuries.  Pain is now radiating into the right buttock and down the right leg.  No known fever.  No change in bowel or bladder habits.  No new weakness in the leg or foot.  She had taken her Ativan last night before bed but was still unable to sleep due to the pain and decided to come to the emergency department.  Past Medical History:  Diagnosis Date   Atrial fibrillation [facility])    Atrial fibrillation (HCC)    Back pain    Dysrhythmia    Hypertension    Hypothyroidism    Stroke [facility])      Physical Exam   Triage Vital Signs: ED Triage Vitals [01/12/22 0119]  Enc Vitals Group     BP (!) 168/94     Pulse Rate 82     Resp 18     Temp 97.8 F (36.6 C)     Temp Source Oral     SpO2 95 %     Weight 115 lb (52.2 kg)     Height 5\' 10"  (1.778 m)     Head Circumference      [facility] Flow      Pain Score 10     Pain Loc      Pain Edu?      Excl. in GC?     Most recent vital signs: Vitals:   01/12/22 0724 01/12/22 1012  BP: (!) 160/88 (!) 158/89  Pulse: 80 78  Resp: 16 16  Temp:    SpO2: 95% 96%    General: Awake, no distress.  CV:  Good peripheral perfusion.  Resp:  Normal effort.  Abd:  No distention.  Other:  DTRs of the lower extremity are brisk.  Motor and sensory strength of bilateral lower extremities is equal   ED Results / Procedures / Treatments   Labs (all labs ordered are listed, but only abnormal results are displayed) Labs Reviewed - No data to display   EKG     RADIOLOGY  MRI lumbar spine : subacute appearing L3 compression fracture with 30 to 35% loss of vertebral body height  without significant retropulsion.  New bilateral L3 neural foraminal stenosis.  Stable chronic L1, L2, and L5 compression fracture.  Chronic L2 retropulsion and spinal and foraminal stenosis at both L1-L2 and L2-L3.  I have independently reviewed and interpreted imaging as well as reviewed report from radiology.  PROCEDURES:  Critical Care performed: No  Procedures   MEDICATIONS ORDERED IN ED:  Medications  ibuprofen (ADVIL) tablet 600 mg (600 mg Oral Given 01/12/22 0127)  oxyCODONE (Oxy IR/ROXICODONE) immediate release tablet 5 mg (5 mg Oral Given 01/12/22 0735)     IMPRESSION / MDM / ASSESSMENT AND PLAN / ED COURSE   I reviewed the triage vital signs and the nursing notes.  Differential diagnosis includes, but is not limited to: Lumbar radiculopathy, acute on chronic back pain, compression fracture  Patient's presentation is most consistent with acute complicated illness / injury requiring diagnostic workup.  87 year old female presenting to the emergency department for  treatment and evaluation of acute on chronic back pain that has been progressively worsening over the past couple of days.  See HPI for further details.  Plan will be to get an MRI lumbar spine.  Patient aware and agreeable to the plan.  She will be given Oxy IR 5 mg for pain.  Subacute L3 compression fracture with 30 to 35% loss of vertebral body height and new bilateral L3 neural foraminal stenosis on MRI.  Pain has improved with medication.  Patient is able to ambulate unassisted here in the emergency department.  Plan will be to have her follow-up with neurosurgery.  This was discussed with the patient who requested that I also discuss it with her daughter.  Daughter was contacted via phone and will call to schedule the appointment for her mom.  Patient and daughter were encouraged to return to the emergency department for any concerns if unable to see primary care or the neurosurgeon.      FINAL CLINICAL  IMPRESSION(S) / ED DIAGNOSES   Final diagnoses:  Compression fracture of L3 lumbar vertebra, closed, initial encounter (HCC)  Sciatica of right side     Rx / DC Orders   ED Discharge Orders          Ordered    methylPREDNISolone (MEDROL DOSEPAK) 4 MG TBPK tablet        01/12/22 0930    oxyCODONE (ROXICODONE) 5 MG immediate release tablet  Every 12 hours PRN        01/12/22 0930             Note:  This document was prepared using Dragon voice recognition software and may include unintentional dictation errors.   Chinita Pester, FNP 01/16/22 1710    Phineas Semen, MD 01/17/22 [phone]

## 2022-01-12 NOTE — ED Triage Notes (Signed)
Pt presents to ER via ems from [facility] c/o chronic lower back pain and right side hip pain.  Pt states pain is "lasting longer" than normal which prompted her to come be seen tonight.  Pt denies any new falls or injuries to back.  Pt is A&O x4 at this time in NAD in triage.

## 2022-01-13 ENCOUNTER — Telehealth: Payer: Self-pay

## 2022-01-13 DIAGNOSIS — S32030D Wedge compression fracture of third lumbar vertebra, subsequent encounter for fracture with routine healing: Secondary | ICD-10-CM

## 2022-01-13 NOTE — Telephone Encounter (Signed)
New pt appt with Danielle in 2-3 weeks. Needs xrays prior to appt.

## 2022-01-13 NOTE — Telephone Encounter (Signed)
-----   Message from Rockey Situ sent at 01/13/2022 11:15 AM EDT ----- Regarding: ER fu Contact: [phone] Kaylee Santiago POA Lumbar Compression fracture and sciatica right side. She was told to follow up with Dr.Yarbrough. MRI done. When should she be seen?

## 2022-01-14 NOTE — Telephone Encounter (Signed)
Kaylee Santiago has been notified of the appt and she will notify [address]

## 2022-01-14 NOTE — Telephone Encounter (Signed)
Olegario Messier POA is calling, her fu appt with Duwayne Heck is on 02/04/22. Patient is complaining that she is still in severe pain. She is taking oxycodone, muscle relaxer, prednisone, and tylenol. Is there anything else that she might be able to take or do? She is requesting to be taken to the ER to get the pain under control but Olegario Messier wants to avoid the ER since they already know what is wrong with her.

## 2022-01-15 NOTE — Telephone Encounter (Signed)
Thoughts?

## 2022-01-15 NOTE — Telephone Encounter (Signed)
I spoke with Olegario Messier and she stated that she talked to [facility] yesterday morning and she was complaining of extreme "stabbing" back pain and also some pain down into the right leg (she believes it is the right leg).

## 2022-01-16 ENCOUNTER — Emergency Department: Payer: Medicare Other

## 2022-01-16 ENCOUNTER — Observation Stay
Admission: EM | Admit: 2022-01-16 | Discharge: 2022-01-19 | Disposition: A | Discharge status: Home / Self Care | Payer: Medicare Other | Attending: Internal Medicine | Admitting: Internal Medicine

## 2022-01-16 DIAGNOSIS — R2242 Localized swelling, mass and lump, left lower limb: Secondary | ICD-10-CM | POA: Diagnosis not present

## 2022-01-16 DIAGNOSIS — Z931 Gastrostomy status: Secondary | ICD-10-CM

## 2022-01-16 DIAGNOSIS — I69321 Dysphasia following cerebral infarction: Secondary | ICD-10-CM | POA: Insufficient documentation

## 2022-01-16 DIAGNOSIS — E039 Hypothyroidism, unspecified: Secondary | ICD-10-CM | POA: Insufficient documentation

## 2022-01-16 DIAGNOSIS — S32030D Wedge compression fracture of third lumbar vertebra, subsequent encounter for fracture with routine healing: Secondary | ICD-10-CM | POA: Insufficient documentation

## 2022-01-16 DIAGNOSIS — M549 Dorsalgia, unspecified: Secondary | ICD-10-CM

## 2022-01-16 DIAGNOSIS — I4819 Other persistent atrial fibrillation: Secondary | ICD-10-CM | POA: Diagnosis present

## 2022-01-16 DIAGNOSIS — M79673 Pain in unspecified foot: Secondary | ICD-10-CM | POA: Diagnosis present

## 2022-01-16 DIAGNOSIS — S9032XA Contusion of left foot, initial encounter: Secondary | ICD-10-CM | POA: Insufficient documentation

## 2022-01-16 DIAGNOSIS — Z7901 Long term (current) use of anticoagulants: Secondary | ICD-10-CM | POA: Insufficient documentation

## 2022-01-16 DIAGNOSIS — I69322 Dysarthria following cerebral infarction: Secondary | ICD-10-CM | POA: Diagnosis not present

## 2022-01-16 DIAGNOSIS — I6930 Unspecified sequelae of cerebral infarction: Secondary | ICD-10-CM

## 2022-01-16 DIAGNOSIS — X58XXXA Exposure to other specified factors, initial encounter: Secondary | ICD-10-CM | POA: Diagnosis not present

## 2022-01-16 DIAGNOSIS — Z87891 Personal history of nicotine dependence: Secondary | ICD-10-CM | POA: Insufficient documentation

## 2022-01-16 DIAGNOSIS — Z79899 Other long term (current) drug therapy: Secondary | ICD-10-CM | POA: Insufficient documentation

## 2022-01-16 DIAGNOSIS — I4891 Unspecified atrial fibrillation: Secondary | ICD-10-CM | POA: Diagnosis not present

## 2022-01-16 DIAGNOSIS — I69352 Hemiplegia and hemiparesis following cerebral infarction affecting left dominant side: Secondary | ICD-10-CM | POA: Insufficient documentation

## 2022-01-16 DIAGNOSIS — I480 Paroxysmal atrial fibrillation: Secondary | ICD-10-CM | POA: Insufficient documentation

## 2022-01-16 DIAGNOSIS — M545 Low back pain, unspecified: Secondary | ICD-10-CM | POA: Diagnosis not present

## 2022-01-16 LAB — COMPREHENSIVE METABOLIC PANEL
ALT: 24 U/L (ref 0–44)
AST: 29 U/L (ref 15–41)
Albumin: 3.8 g/dL (ref 3.5–5.0)
Alkaline Phosphatase: 72 U/L (ref 38–126)
Anion gap: 10 (ref 5–15)
BUN: 23 mg/dL (ref 8–23)
CO2: 28 mmol/L (ref 22–32)
Calcium: 9.2 mg/dL (ref 8.9–10.3)
Chloride: 94 mmol/L — ABNORMAL LOW (ref 98–111)
Creatinine, Ser: 0.65 mg/dL (ref 0.44–1.00)
GFR, Estimated: >60 mL/min (ref >60)
Glucose, Bld: 119 mg/dL — ABNORMAL HIGH (ref 70–99)
Potassium: 4.7 mmol/L (ref 3.5–5.1)
Sodium: 132 mmol/L — ABNORMAL LOW (ref 135–145)
Total Bilirubin: 0.8 mg/dL (ref 0.3–1.2)
Total Protein: 7.5 g/dL (ref 6.5–8.1)

## 2022-01-16 LAB — CBC WITH DIFFERENTIAL/PLATELET
Abs Immature Granulocytes: 0.03 10*3/uL (ref 0.00–0.07)
Basophils Absolute: 0 10*3/uL (ref 0.0–0.1)
Basophils Relative: 0 %
Eosinophils Absolute: 0 10*3/uL (ref 0.0–0.5)
Eosinophils Relative: 0 %
HCT: 41.8 % (ref 36.0–46.0)
Hemoglobin: 13.9 g/dL (ref 12.0–15.0)
Immature Granulocytes: 0 %
Lymphocytes Relative: 5 %
Lymphs Abs: 0.4 10*3/uL — ABNORMAL LOW (ref 0.7–4.0)
MCH: 30.2 pg (ref 26.0–34.0)
MCHC: 33.3 g/dL (ref 30.0–36.0)
MCV: 90.9 fL (ref 80.0–100.0)
Monocytes Absolute: 0.7 10*3/uL (ref 0.1–1.0)
Monocytes Relative: 8 %
Neutro Abs: 7.6 10*3/uL (ref 1.7–7.7)
Neutrophils Relative %: 87 %
Platelets: 300 10*3/uL (ref 150–400)
RBC: 4.6 MIL/uL (ref 3.87–5.11)
RDW: 12.9 % (ref 11.5–15.5)
WBC: 8.7 10*3/uL (ref 4.0–10.5)
nRBC: 0 % (ref 0.0–0.2)

## 2022-01-16 MED ORDER — ONDANSETRON HCL 4 MG PO TABS: 4.0000 mg | ORAL_TABLET | Freq: Four times a day (QID) | ORAL | Status: DC | PRN

## 2022-01-16 MED ORDER — ONDANSETRON HCL 4 MG/2ML IJ SOLN: 4.0000 mg | Freq: Four times a day (QID) | INTRAMUSCULAR | Status: DC | PRN

## 2022-01-16 MED ORDER — ACETAMINOPHEN 325 MG PO TABS
650.0000 mg | ORAL_TABLET | Freq: Four times a day (QID) | ORAL | Status: DC | PRN
  Administered 2022-01-17 – 2022-01-19 (×7): 650 mg via ORAL
  Filled 2022-01-16 (×9): qty 2

## 2022-01-16 MED ORDER — OXYCODONE HCL 5 MG PO TABS
5.0000 mg | ORAL_TABLET | Freq: Two times a day (BID) | ORAL | Status: DC | PRN
  Administered 2022-01-17 – 2022-01-18 (×3): 5 mg via ORAL
  Filled 2022-01-16 (×4): qty 1

## 2022-01-16 MED ORDER — HYDROMORPHONE HCL 1 MG/ML IJ SOLN
0.5000 mg | INTRAMUSCULAR | Status: DC | PRN
  Administered 2022-01-18: 0.5 mg via INTRAVENOUS
  Filled 2022-01-16 (×2): qty 1

## 2022-01-16 MED ORDER — LORAZEPAM 0.5 MG PO TABS
0.5000 mg | ORAL_TABLET | Freq: Every evening | ORAL | Status: DC | PRN
Start: 2022-01-16 — End: 2022-01-19
  Administered 2022-01-17: 0.5 mg via ORAL
  Filled 2022-01-16: qty 1

## 2022-01-16 MED ORDER — ACETAMINOPHEN 650 MG RE SUPP: 650.0000 mg | Freq: Four times a day (QID) | RECTAL | Status: DC | PRN

## 2022-01-16 MED ORDER — RIVAROXABAN 20 MG PO TABS
20.0000 mg | ORAL_TABLET | Freq: Every day | ORAL | Status: DC
  Administered 2022-01-17 – 2022-01-19 (×3): 20 mg via ORAL
  Filled 2022-01-16 (×4): qty 1

## 2022-01-16 MED ORDER — MORPHINE SULFATE (PF) 2 MG/ML IV SOLN
2.0000 mg | Freq: Once | INTRAVENOUS | Status: AC
  Administered 2022-01-16: 2 mg via INTRAVENOUS
  Filled 2022-01-16: qty 1

## 2022-01-16 MED ORDER — LEVOTHYROXINE SODIUM 50 MCG PO TABS
75.0000 ug | ORAL_TABLET | Freq: Every day | ORAL | Status: DC
Start: 2022-01-17 — End: 2022-01-19
  Administered 2022-01-17 – 2022-01-19 (×3): 75 ug via ORAL
  Filled 2022-01-16 (×3): qty 1

## 2022-01-16 MED ORDER — LISINOPRIL 10 MG PO TABS: 10.0000 mg | ORAL_TABLET | Freq: Every day | ORAL | Status: DC

## 2022-01-16 MED ORDER — METOPROLOL TARTRATE 25 MG PO TABS
25.0000 mg | ORAL_TABLET | Freq: Two times a day (BID) | ORAL | Status: DC
  Administered 2022-01-17 – 2022-01-19 (×5): 25 mg
  Filled 2022-01-16 (×5): qty 1

## 2022-01-16 MED ORDER — ATORVASTATIN CALCIUM 20 MG PO TABS
40.0000 mg | ORAL_TABLET | Freq: Every day | ORAL | Status: DC
  Administered 2022-01-17 – 2022-01-19 (×3): 40 mg
  Filled 2022-01-16 (×3): qty 2

## 2022-01-16 MED ORDER — MELATONIN 5 MG PO TABS
5.0000 mg | ORAL_TABLET | Freq: Every day | ORAL | Status: DC
  Administered 2022-01-17 – 2022-01-18 (×2): 5 mg
  Filled 2022-01-16 (×2): qty 1

## 2022-01-16 MED ORDER — HYDROMORPHONE HCL 1 MG/ML IJ SOLN
0.5000 mg | Freq: Once | INTRAMUSCULAR | Status: AC
  Administered 2022-01-16: 0.5 mg via INTRAVENOUS
  Filled 2022-01-16: qty 0.5

## 2022-01-16 NOTE — Assessment & Plan Note (Addendum)
Multiple lumbar compression fractures, chronic and subacute - MRI from 7/4 showed the following: 1. Subacute appearing L3 compression fracture with 30-35% loss of vertebral body height. No significant retropulsion, but new bilateral L3 neural foraminal stenosis. 2. Stable chronic L1, L2, and L5 compression fractures. Chronic L2 retropulsion and spinal and foraminal stenosis at both L1-L2 and L2-L3. -Pain control -Physical therapy -Can consider neurosurgical consult for additional recommendations

## 2022-01-16 NOTE — Assessment & Plan Note (Signed)
Continue levothyroxine 

## 2022-01-16 NOTE — Assessment & Plan Note (Signed)
Continue tube feeds.   

## 2022-01-16 NOTE — Assessment & Plan Note (Deleted)
Increase nursing assistance ?

## 2022-01-16 NOTE — H&P (Signed)
History and Physical    Patient: Kaylee Santiago TSV:[id number] DOB: 03-12-1935 DOA: 01/16/2022 DOS: the patient was seen and examined on 01/16/2022 PCP: Lynnea Ferrier, MD  Patient coming from: ALF/ILF  Chief Complaint:  Chief Complaint  Patient presents with   Foot Pain    HPI: Kaylee Santiago is a 87 y.o. 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  She is also concerned about the swelling on her foot.  She denied numbness in the groin area or problems with bladder or bowel movements ED course and data review: BP 190/97 with pulse 102 and otherwise normal vitals.  CBC and CMP unremarkable.  EKG, personally viewed and interpreted showing A-fib at 99 with VPCs and no acute ST-T wave changes.  X-ray of the foot shows no acute bony abnormality but shows a soft tissue abnormality dorsum of the foot.  Patient treated with Dilaudid and then morphine in the ED but continued to have inadequate pain relief.  Admission requested for pain control.     Past Medical History:  Diagnosis Date   Atrial fibrillation (HCC)    Atrial fibrillation (HCC)    Back pain    Dysrhythmia    Hypertension    Hypothyroidism    Stroke [facility])    Past Surgical History:  Procedure Laterality Date   ABDOMINAL HYSTERECTOMY     BREAST EXCISIONAL BIOPSY Right 70's   CATARACT EXTRACTION W/ INTRAOCULAR LENS  IMPLANT, BILATERAL     KYPHOPLASTY N/A 12/21/2018   Procedure: KYPHOPLASTY L1;  Surgeon: Kennedy Bucker, MD;  Location: [facility] ORS;   Service: Orthopedics;  Laterality: N/A;   Social History:  reports that she has quit smoking. Her smoking use included cigarettes. She has never used smokeless tobacco. She reports that she does not currently use alcohol. She reports that she does not use drugs.  Allergies  Allergen Reactions   Amoxicillin-Pot Clavulanate Diarrhea   Levofloxacin Diarrhea   Trazodone Other (See Comments)    nightmares    Family History  Problem Relation Age of Onset   Breast cancer Sister 73   Breast cancer Sister        38's   CAD Mother     Prior to Admission medications   Medication Sig Start Date End Date Taking? Authorizing Provider  acetaminophen (TYLENOL) 325 MG tablet Place 650 mg into feeding tube every 6 (six) hours as needed. 11/02/21   [provider]  atorvastatin (LIPITOR) 40 MG tablet Place 40 mg into feeding tube daily. 11/02/21   [provider]  hydrocortisone 2.5 % cream Apply 1 application. topically 2 (two) times daily. Apply to upper back.    [provider]  Lactobacillus Rhamnosus, GG, (CULTURELLE) CAPS Take 1 capsule by mouth daily.    [provider]  levothyroxine (SYNTHROID) 75 MCG tablet Take 1 tablet by mouth daily. 03/03/21   [provider]  lisinopril (ZESTRIL) 10 MG tablet Take 1 tablet (10 mg total) by mouth daily. Patient taking differently: Take 10 mg by mouth 2 (two) times daily.  10/11/21   Alford Highland, MD  LORazepam (ATIVAN) 0.5 MG tablet Take 0.5 mg by mouth at bedtime as needed for sleep.    [provider]  melatonin 3 MG TABS tablet Place 3 mg into feeding tube at bedtime. 11/02/21   [provider]  methylPREDNISolone (MEDROL DOSEPAK) 4 MG TBPK tablet Take 6 tablets on day one and decrease by 1 tablet daily until finished. 01/12/22   Triplett, Rulon Eisenmenger B, FNP  metoprolol tartrate (LOPRESSOR) 25 MG tablet Place 1 tablet (25 mg total) into feeding tube 2 (two) times daily. 11/09/21   Darlin Priestly, MD   oxyCODONE (ROXICODONE) 5 MG immediate release tablet Take 1 tablet (5 mg total) by mouth every 12 (twelve) hours as needed. 01/12/22 01/12/23  Triplett, Rulon Eisenmenger B, FNP  polyethylene glycol powder (GLYCOLAX/MIRALAX) 17 GM/SCOOP powder Place 17 g into feeding tube. 11/02/21   [provider]  senna (SENOKOT) 8.6 MG tablet Place 8.6 mg into feeding tube at bedtime. 11/02/21   [provider]  sodium chloride 1 g tablet Take 1 tablet (1 g total) by mouth 2 (two) times daily with a meal. 10/10/21   Wieting, Richard, MD  XARELTO 20 MG TABS tablet Take 20 mg by mouth daily. 05/12/21   [provider]    Physical Exam: Vitals:   01/16/22 1900 01/16/22 1930 01/16/22 2000 01/16/22 2100  BP: (!) 170/92 (!) 178/98 (!) 153/81 (!) 177/77  Pulse: 99 98 (!) 102 82  Resp: (!) 25 17 19 16   Temp:      SpO2: 98% 98% 98% 100%  Weight:      Height:       Physical Exam Vitals and nursing note reviewed.  Constitutional:      General: She is not in acute distress. HENT:     Head: Normocephalic and atraumatic.  Cardiovascular:     Rate and Rhythm: Normal rate and regular rhythm.     Heart sounds: Normal heart sounds.  Pulmonary:     Effort: Pulmonary effort is normal.     Breath sounds: Normal breath sounds.  Abdominal:     Palpations: Abdomen is soft.     Tenderness: There is no abdominal tenderness.  Musculoskeletal:     Comments: Bruising and swelling dorsum of left foot.  See picture below  Neurological:     Mental Status: Mental status is at baseline.      Labs on Admission: I have personally reviewed following labs and imaging studies  CBC: Recent Labs  Lab 01/16/22 1940  WBC 8.7  NEUTROABS 7.6  HGB 13.9  HCT 41.8  MCV 90.9  PLT 300   Basic Metabolic Panel: Recent Labs  Lab 01/16/22 1940  NA 132*  K 4.7  CL 94*  CO2 28  GLUCOSE 119*  BUN 23  CREATININE 0.65  CALCIUM 9.2   GFR: Estimated Creatinine Clearance: 43.6 mL/min (by C-G formula based on SCr of  0.65 mg/dL). Liver Function Tests: Recent Labs  Lab 01/16/22 1940  AST 29  ALT 24  ALKPHOS 72  BILITOT 0.8  PROT 7.5  ALBUMIN 3.8   No results for input(s): "LIPASE", "AMYLASE" in the last 168 hours. No results for input(s): "AMMONIA" in the last 168 hours. Coagulation Profile: No results for input(s): "INR", "PROTIME" in the last 168 hours. Cardiac Enzymes: No results for input(s): "CKTOTAL", "CKMB", "CKMBINDEX", "TROPONINI" in the last 168 hours. BNP (last 3 results) No results for input(s): "PROBNP" in the last 8760 hours. HbA1C: No results  for input(s): "HGBA1C" in the last 72 hours. CBG: No results for input(s): "GLUCAP" in the last 168 hours. Lipid Profile: No results for input(s): "CHOL", "HDL", "LDLCALC", "TRIG", "CHOLHDL", "LDLDIRECT" in the last 72 hours. Thyroid Function Tests: No results for input(s): "TSH", "T4TOTAL", "FREET4", "T3FREE", "THYROIDAB" in the last 72 hours. Anemia Panel: No results for input(s): "VITAMINB12", "FOLATE", "FERRITIN", "TIBC", "IRON", "RETICCTPCT" in the last 72 hours. Urine analysis:    Component Value Date/Time   COLORURINE YELLOW (A) 11/07/2021 2057   APPEARANCEUR HAZY (A) 11/07/2021 2057   LABSPEC 1.009 11/07/2021 2057   PHURINE 8.0 11/07/2021 2057   GLUCOSEU NEGATIVE 11/07/2021 2057   HGBUR NEGATIVE 11/07/2021 2057   BILIRUBINUR NEGATIVE 11/07/2021 2057   KETONESUR NEGATIVE 11/07/2021 2057   PROTEINUR NEGATIVE 11/07/2021 2057   NITRITE NEGATIVE 11/07/2021 2057   LEUKOCYTESUR SMALL (A) 11/07/2021 2057    Radiological Exams on Admission: DG Foot Complete Left  Result Date: 01/16/2022 CLINICAL DATA:  Recent foot pain with palpable abnormality, initial encounter EXAM: LEFT FOOT - COMPLETE 3+ VIEW COMPARISON:  None Available. FINDINGS: Accentuation of the plantar arch is noted. Soft tissue prominence is noted along the dorsum of the foot consistent with the given clinical history. No abnormal calcifications are seen. No acute  fracture is noted. Mild medial subluxation of the fifth middle phalanx with respect to the proximal phalanx is noted. Postsurgical changes in the distal first metatarsal are seen. IMPRESSION: No acute bony abnormality noted. Soft tissue abnormality is noted along the dorsum of the foot consistent with the given clinical history. No associated calcifications are noted. Electronically Signed   By: Alcide Clever M.D.   On: 01/16/2022 19:26     Data Reviewed: Relevant notes from primary care and specialist visits, past discharge summaries as available in EHR, including Care Everywhere. Prior diagnostic testing as pertinent to current admission diagnoses Updated medications and problem lists for reconciliation ED course, including vitals, labs, imaging, treatment and response to treatment Triage notes, nursing and pharmacy notes and ED provider's notes Notable results as noted in HPI   Assessment and Plan: * Intractable back pain Multiple lumbar compression fractures, chronic and subacute - MRI from 7/4 showed the following: 1. Subacute appearing L3 compression fracture with 30-35% loss of vertebral body height. No significant retropulsion, but new bilateral L3 neural foraminal stenosis. 2. Stable chronic L1, L2, and L5 compression fractures. Chronic L2 retropulsion and spinal and foraminal stenosis at both L1-L2 and L2-L3. -Pain control -Physical therapy -Can consider neurosurgical consult for additional recommendations  Localized swelling of left foot Bruise left foot Etiology uncertain Podiatry consult   AF (paroxysmal atrial fibrillation) (HCC) Chronic anticoagulation Continue rivaroxaban and metoprolol  Hypothyroidism Continue levothyroxine  History of CVA with residual left hemiparesis, dysarthria and dysphagia Increase nursing assistance for related deficits Continue Xarelto and statins  PEG (percutaneous endoscopic gastrostomy) status due to dysphagia from old  CVA(HCC) Continue tube feeds        DVT prophylaxis: Xarelto  Consults: none  Advance Care Planning:   Code Status: Prior   Family Communication: none  Disposition Plan: Back to previous home environment  Severity of Illness: The appropriate patient status for this patient is OBSERVATION. Observation status is judged to be reasonable and necessary in order to provide the required intensity of service to ensure the patient's safety. The patient's presenting symptoms, physical exam findings, and initial radiographic and laboratory data in the context of their medical condition is felt to place them at decreased risk for  further clinical deterioration. Furthermore, it is anticipated that the patient will be medically stable for discharge from the hospital within 2 midnights of admission.   Author: Andris Baumann, MD 01/16/2022 10:55 PM  For on call review [email or web address].

## 2022-01-16 NOTE — ED Provider Notes (Signed)
[facility] Provider Note    Event Date/Time   First MD Initiated Contact with Patient 01/16/22 1833     (approximate)   History   Foot Pain   HPI  Kaylee Santiago is a 87 y.o. female with past medical history of paroxysmal A-fib on anticoagulation hypertension hypothyroidism CVA presents with foot pain.  Yesterday patient noticed a lump on her foot with associated bruising.  She denies any trauma.  It is not painful when she is not moving it.  Denies fevers chills.  Patient seen several days ago for back pain found to have a subacute L3 compression fracture was ultimately discharged.  Has been taking a prednisone taper for the pain.  Patient has had ongoing back pain making it difficult for her to do physical therapy and difficult to ambulate.  Denies bowel bladder incontinence numbness tingling weakness.    Past Medical History:  Diagnosis Date   Atrial fibrillation (HCC)    Atrial fibrillation (HCC)    Back pain    Dysrhythmia    Hypertension    Hypothyroidism    Stroke [facility])     Patient Active Problem List   Diagnosis Date Noted   Acute CVA (cerebrovascular accident) (HCC) 11/07/2021   S/P percutaneous endoscopic gastrostomy (PEG) tube placement (HCC)    Chronic anticoagulation    Hemiparesis affecting left side as late effect of cerebrovascular accident (CVA) (HCC)    Dysphagia due to recent cerebrovascular accident    Dysarthria as late effect of cerebrovascular disease    Headache 10/09/2021   Protein-calorie malnutrition, severe 09/23/2021   Hypertensive urgency 09/21/2021   History of COVID-19 09/21/2021   Lower extremity edema 09/21/2021   Hypophosphatemia    COPD with acute exacerbation (HCC)    Hyponatremia 07/01/2021   Bronchopneumonia 07/01/2021   Pneumonia due to COVID-19 virus 07/01/2021   Chronic respiratory failure with hypoxia (HCC) 07/01/2021   History of epistaxis 07/01/2021   Acute metabolic encephalopathy 07/01/2021    Malnutrition of moderate degree 04/10/2021   Acute blood loss anemia 04/10/2021   Abnormal CT of the abdomen 04/10/2021   Acute on chronic respiratory failure with hypoxia (HCC) 04/09/2021   Epistaxis 04/09/2021   AF (paroxysmal atrial fibrillation) (HCC)    Hypothyroidism    Hypertension    Bronchiectasis without complication (HCC) 06/13/2019     Physical Exam  Triage Vital Signs: ED Triage Vitals  Enc Vitals Group     BP 01/16/22 1838 (!) 190/97     Pulse Rate 01/16/22 1838 98     Resp 01/16/22 1838 15     Temp 01/16/22 1838 98.4 F (36.9 C)     Temp src --      SpO2 01/16/22 1837 99 %     Weight 01/16/22 1839 116 lb 2.9 oz (52.7 kg)     Height 01/16/22 1839 5\' 10"  (1.778 m)     Head Circumference --      Peak Flow --      Pain Score 01/16/22 1839 0     Pain Loc --      Pain Edu? --      Excl. in GC? --     Most recent vital signs: Vitals:   01/16/22 2000 01/16/22 2100  BP: (!) 153/81 (!) 177/77  Pulse: (!) 102 82  Resp: 19 16  Temp:    SpO2: 98% 100%     General: Awake, no distress.  CV:  Good peripheral perfusion.  Resp:  Normal effort.  Abd:  No distention.  Neuro:             Awake, Alert, Oriented x 3  Other:  walnut sized mass over the fourth metacarpal that is mildly tender but not fluctuant well-defined not pulsatile with surrounding ecchymosis on the entire foot    ED Results / Procedures / Treatments  Labs (all labs ordered are listed, but only abnormal results are displayed) Labs Reviewed  CBC WITH DIFFERENTIAL/PLATELET - Abnormal; Notable for the following components:      Result Value   Lymphs Abs 0.4 (*)    All other components within normal limits  COMPREHENSIVE METABOLIC PANEL - Abnormal; Notable for the following components:   Sodium 132 (*)    Chloride 94 (*)    Glucose, Bld 119 (*)    All other components within normal limits     EKG     RADIOLOGY    PROCEDURES:  Critical Care performed: No  Procedures  The  patient is on the cardiac monitor to evaluate for evidence of arrhythmia and/or significant heart rate changes.   MEDICATIONS ORDERED IN ED: Medications  morphine (PF) 2 MG/ML injection 2 mg (has no administration in time range)  HYDROmorphone (DILAUDID) injection 0.5 mg (0.5 mg Intravenous Given 01/16/22 2030)     IMPRESSION / MDM / ASSESSMENT AND PLAN / ED COURSE  I reviewed the triage vital signs and the nursing notes.                              Patient's presentation is most consistent with acute, uncomplicated illness.  Differential diagnosis includes, but is not limited to, neuroma, ganglioma, vascular lesion, abscess, hematoma  The patient is an 87 year old female presents with a mass and pain in the left foot.  She denies any preceding trauma.  Noted yesterday in area of localized swelling with some ecchymosis.  On exam there is a very well defined approximately walnut sized lesion over the fourth metacarpal that is soft mildly tender not fluctuant not pulsatile.  I performed a bedside ultrasound there is no color Doppler flow to indicate a vascular lesion.  It is heterogenous and somewhat echogenic solid in appearance so do not feel that I&D or aspiration will be of use.  Does not have the typical appearance of a ganglioma that I would expect.  Overall unclear what it is could be hematoma.  Surrounding ecchymosis makes me think that there may have been occult trauma.  Patient otherwise has no systemic symptoms.  She is anticoagulated.  We will treat her pain and refer to podiatry likely for further work-up and possible excision.  Pt does have ongoing back pain, no red flag symptoms of bowel bladder incontinence, numbness/weakness. MRI from 4 days ago showed subacute L3 compression fracture. Pt taking pred for this, I advised she stop as has not been helping and this could be causing LE edema.  Patient expressed that she has not been able to ambulate due to significant back pain has not  been doing well at home.  She required IV morphine in the emergency department did briefly drop her sats but this resolved she is no longer on oxygen.  Patient did ambulatory trial and had significant difficulty getting to the bathroom does not feel that she is safe to go home and is requesting admission.  Given this is her second visit with pain that is poorly controlled I do think  it is reasonable for her to be admitted to see physical therapy and get better control of the pain.   FINAL CLINICAL IMPRESSION(S) / ED DIAGNOSES   Final diagnoses:  Contusion of left foot, initial encounter  Mass of left foot  Low back pain, unspecified back pain laterality, unspecified chronicity, unspecified whether sciatica present     Rx / DC Orders   ED Discharge Orders     None        Note:  This document was prepared using Dragon voice recognition software and may include unintentional dictation errors.   Rada Hay, MD 01/16/22 2206

## 2022-01-16 NOTE — Assessment & Plan Note (Addendum)
Chronic anticoagulation Continue rivaroxaban and metoprolol

## 2022-01-16 NOTE — ED Triage Notes (Signed)
Patient from [facility] for discoloration to L foot. Patient states yesterday she noticed some discoloration and a small bump that have progressed today. Patient denies pain but states sensation feels dull to area. Sluggish capillary refill. Pt A&O on arrival.

## 2022-01-16 NOTE — Assessment & Plan Note (Deleted)
Increased nursing assistance for communication

## 2022-01-16 NOTE — Assessment & Plan Note (Addendum)
Increase nursing assistance for related deficits Continue Xarelto and statins

## 2022-01-17 ENCOUNTER — Other Ambulatory Visit: Payer: Self-pay

## 2022-01-17 DIAGNOSIS — M549 Dorsalgia, unspecified: Secondary | ICD-10-CM | POA: Diagnosis not present

## 2022-01-17 DIAGNOSIS — R2242 Localized swelling, mass and lump, left lower limb: Secondary | ICD-10-CM

## 2022-01-17 DIAGNOSIS — M545 Low back pain, unspecified: Secondary | ICD-10-CM | POA: Diagnosis not present

## 2022-01-17 NOTE — Assessment & Plan Note (Addendum)
Bruise left foot Etiology uncertain Podiatry consult

## 2022-01-17 NOTE — Consult Note (Signed)
PODIATRY CONSULTATION  NAME Kaylee Santiago MRN [id number] DOB 1934/12/12 DOA 01/16/2022   Reason for consult: Soft tissue mass LT foot Chief Complaint  Patient presents with   Foot Pain    Consulting physician: Lindajo Royal MD, [facility]  History of present illness: 87 y.o. female PMHx A-fib on Xarelto, HTN, anxiety, CVA w/ residual left hemiparesis, dysarthria, dysphagia admitted for worsening chronic back pain with lumbar MRI showing L3 compression fracture.  Also concerned for bruising of the foot. Patient poor historian and denies trauma. Spoke with niece, Darl Pikes, on the phone today during evaluation and she believes it has been present for only  a few days. Podiatry consulted. It appears that Dr. Ether Griffins, [facility], had seen the patient earlier this AM and I was unaware at the time of evaluation.   Past Medical History:  Diagnosis Date   Atrial fibrillation [facility])    Atrial fibrillation (HCC)    Back pain    Dysrhythmia    Hypertension    Hypothyroidism    Stroke [facility])        Latest Ref Rng & Units 01/16/2022    7:40 PM 11/09/2021    6:06 AM 11/07/2021    8:29 PM  CBC  WBC 4.0 - 10.5 K/uL 8.7  6.9  10.9   Hemoglobin 12.0 - 15.0 g/dL 03.5  46.5  68.1   Hematocrit 36.0 - 46.0 % 41.8  39.3  39.0   Platelets 150 - 400 K/uL 300  340  333        Latest Ref Rng & Units 01/16/2022    7:40 PM 11/09/2021    6:06 AM 11/07/2021    8:29 PM  BMP  Glucose 70 - 99 mg/dL 275  170  017   BUN 8 - 23 mg/dL 23  20  19    Creatinine 0.44 - 1.00 mg/dL  4.94  4.96   Sodium 135 - 145 mmol/L 132  133  129   Potassium 3.5 - 5.1 mmol/L 4.7  4.2  4.4   Chloride 98 - 111 mmol/L 94  96  95   CO2 22 - 32 mmol/L 28  30  28    Calcium 8.9 - 10.3 mg/dL 9.2  9.1  8.8          Focused Exam:  Dermatology: No open wounds.   Vascular: Diminished pulses. Skin cool to touch  Neurological: Light touch and protective threshold grossly intact bilaterally.   Musculoskeletal  Exam: Non adhered slightly tender mass noted to the dorsum of the foot with ecchymosis encompassing the dorsum of the foot. Clinically appears to resemble hematoma especially since the patient is on Xarelto.     ASSESSMENT/PLAN OF CARE Likely hematoma with bruising of foot left - decision made to attempt aspiration at bedside. The area was prepped with betadine and 18g needle utlized to attempt aspiration however nothing was able to be aspirated from the lesion/mass.  - light dressing applied.  - discussed sx versus observation. Given patient's comorbities, health, and nonermergent nature of the likely hematoma we will simply observe for now. Plan of care discussed with 7.59, niece, who agrees.  - Will defer further care to [facility], Dr. Darl Pikes.     [facility], DPM [facility]  Dr. Ether Griffins, DPM    [address]  [place], [place] [address]                Office [phone]  Fax [phone]

## 2022-01-17 NOTE — Progress Notes (Signed)
PROGRESS NOTE    Kaylee Santiago  CZY:[id number] DOB: [birth year] DOA: 01/16/2022 PCP: Lynnea Ferrier, MD    Brief Narrative:  Kaylee Santiago is a 87 y.o. 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  She is also concerned about the swelling on her foot.  She denied numbness in the groin area or problems with bladder or bowel movements ED course and data review: BP 190/97 with pulse 102 and otherwise normal vitals.  CBC and CMP   X-ray of the foot shows no acute bony abnormality but shows a soft tissue abnormality dorsum of the foot.  Consultants:  Podiatry  Procedures:   Antimicrobials:      Subjective: C/o back pain improving with heated pad. Told pt be cautious not to burn itself  Objective: Vitals:   01/17/22 0600 01/17/22 0750 01/17/22 0918 01/17/22 1224  BP: (!) 166/81 (!) 164/98 (!) 172/83 (!) 164/74  Pulse: 64   72  Resp: 17 18 18    Temp:  97.8 F (36.6 C) 98.1 F (36.7 C)   TempSrc:  Oral Oral   SpO2: 95% 97% 97%   Weight:      Height:        Intake/Output Summary (Last 24 hours) at 01/17/2022 1409 Last data filed at 01/17/2022 1356 Gross per 24 hour  Intake 600 ml  Output 300 ml  Net 300 ml   Filed Weights   01/16/22 1839  Weight: 52.7 kg    Examination: Calm, NAD Cta no w/r Reg s1/s2 no gallop Soft benign +bs No edema Aaoxox3  Mood and affect appropriate in current setting     Data Reviewed: I have personally reviewed following labs and imaging studies  CBC: Recent  Labs  Lab 01/16/22 1940  WBC 8.7  NEUTROABS 7.6  HGB 13.9  HCT 41.8  MCV 90.9  PLT 300   Basic Metabolic Panel: Recent Labs  Lab 01/16/22 1940  NA 132*  K 4.7  CL 94*  CO2 28  GLUCOSE 119*  BUN 23  CREATININE 0.65  CALCIUM 9.2   GFR: Estimated Creatinine Clearance: 43.6 mL/min (by C-G formula based on SCr of 0.65 mg/dL). Liver Function Tests: Recent Labs  Lab 01/16/22 1940  AST 29  ALT 24  ALKPHOS 72  BILITOT 0.8  PROT 7.5  ALBUMIN 3.8   No results for input(s): "LIPASE", "AMYLASE" in the last 168 hours. No results for input(s): "AMMONIA" in the last 168 hours. Coagulation Profile: No results for input(s): "INR", "PROTIME" in the last 168 hours. Cardiac Enzymes: No results for input(s): "CKTOTAL", "CKMB", "CKMBINDEX", "TROPONINI" in the last 168 hours. BNP (last 3 results) No results for input(s): "PROBNP" in the last 8760 hours. HbA1C: No results for input(s): "HGBA1C" in the last 72 hours. CBG: No results for input(s): "GLUCAP" in the last 168 hours. Lipid Profile: No results for input(s): "CHOL", "HDL", "LDLCALC", "TRIG", "CHOLHDL", "LDLDIRECT" in the last 72 hours. Thyroid Function Tests: No results for input(s): "TSH", "T4TOTAL", "FREET4", "T3FREE", "THYROIDAB" in the last 72 hours. Anemia Panel: No  results for input(s): "VITAMINB12", "FOLATE", "FERRITIN", "TIBC", "IRON", "RETICCTPCT" in the last 72 hours. Sepsis Labs: No results for input(s): "PROCALCITON", "LATICACIDVEN" in the last 168 hours.  No results found for this or any previous visit (from the past 240 hour(s)).       Radiology Studies: DG Foot Complete Left  Result Date: 01/16/2022 CLINICAL DATA:  Recent foot pain with palpable abnormality, initial encounter EXAM: LEFT FOOT - COMPLETE 3+ VIEW COMPARISON:  None Available. FINDINGS: Accentuation of the plantar arch is noted. Soft tissue prominence is noted along the dorsum of the foot consistent with the given clinical history. No  abnormal calcifications are seen. No acute fracture is noted. Mild medial subluxation of the fifth middle phalanx with respect to the proximal phalanx is noted. Postsurgical changes in the distal first metatarsal are seen. IMPRESSION: No acute bony abnormality noted. Soft tissue abnormality is noted along the dorsum of the foot consistent with the given clinical history. No associated calcifications are noted. Electronically Signed   By: Alcide Clever M.D.   On: 01/16/2022 19:26        Scheduled Meds:  atorvastatin  40 mg Per Tube Daily   levothyroxine  75 mcg Oral Q0600   melatonin  5 mg Per Tube QHS   metoprolol tartrate  25 mg Per Tube BID   rivaroxaban  20 mg Oral Daily   Continuous Infusions:  Assessment & Plan:   Principal Problem:   Intractable back pain Active Problems:   AF (paroxysmal atrial fibrillation) (HCC)   Chronic anticoagulation   Localized swelling of left foot   Hypothyroidism   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(HCC)   Closed compression fracture of L3 lumbar vertebra, with routine healing, subsequent encounter   History of CVA with residual left hemiparesis, dysarthria and dysphagia  Intractable back pain Multiple lumbar compression fractures, chronic and subacute - MRI from 7/4 showed the following: 1. Subacute appearing L3 compression fracture with 30-35% loss of vertebral body height. No significant retropulsion, but new bilateral L3 neural foraminal stenosis. 2. Stable chronic L1, L2, and L5 compression fractures. Chronic L2 retropulsion and spinal and foraminal stenosis at both L1-L2 and L2-L3. 7/9 pain control Will ask neurosurgery to review mri and if any surgical v.s. conservative plans Lidocaine patch    Localized swelling of left foot Bruise left foot Podiatry consulted input was appreciated.  Likely hematoma oozing of the foot. It was aspirated by bedside, but nothing aspirated.For now will  observe Dressing applied Observe for now Follow-up with [facility] clinic podiatry Dr. Ether Griffins as outpatient     AF (paroxysmal atrial fibrillation) (HCC) Chronic anticoagulation On Xarelto and metoprolol   Hypothyroidism Continue levothyroxine   History of CVA with residual left hemiparesis, dysarthria and dysphagia Increase nursing assistance for related deficits Continue Xarelto and statins   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(HCC) Continue tube feeds         DVT prophylaxis: Xarelto Code Status: Full Family Communication: None at bedside Disposition Plan: Likely home Status is: Observation The patient remains OBS appropriate and will d/c before 2 midnights.        LOS: 0 days   Time spent: 35 minutes    Lynn Ito, MD [facility] Pager [phone]  If 7PM-7AM, please contact night-coverage 01/17/2022, 2:09 PM

## 2022-01-17 NOTE — Progress Notes (Signed)
Orthopedic Tech Progress Note Patient Details:  Kaylee Santiago 1934-08-17 [id number]  Order for LSO Quick Draw back brace called into [facility] at 1545.  Patient ID: Kaylee Santiago, female   DOB: February 03, 1935, 87 y.o.   MRN: [id number]  Docia Furl 01/17/2022, 3:48 PM

## 2022-01-17 NOTE — Plan of Care (Signed)
  Problem: Activity: Goal: Risk for activity intolerance will decrease Outcome: Progressing   Problem: Coping: Goal: Level of anxiety will decrease Outcome: Progressing   Problem: Safety: Goal: Ability to remain free from injury will improve Outcome: Progressing   

## 2022-01-17 NOTE — Consult Note (Signed)
ORTHOPAEDIC CONSULTATION  REQUESTING PHYSICIAN: Nolberto Hanlon, MD  Chief Complaint: Left foot bruise  HPI: Kaylee Santiago is a 87 y.o. female who complains of left foot bruising and discoloration.  She denies any trauma.  Admitted with chronic back pain.  She states she had been wearing a shoe with several straps that could have caused an irritation to her foot.  Past Medical History:  Diagnosis Date   Atrial fibrillation ([place])    Atrial fibrillation ([place])    Back pain    Dysrhythmia    Hypertension    Hypothyroidism    Stroke [facility])    Past Surgical History:  Procedure Laterality Date   ABDOMINAL HYSTERECTOMY     BREAST EXCISIONAL BIOPSY Right 70's   CATARACT EXTRACTION W/ INTRAOCULAR LENS  IMPLANT, BILATERAL     KYPHOPLASTY N/A 12/21/2018   Procedure: KYPHOPLASTY L1;  Surgeon: Hessie Knows, MD;  Location: [facility] ORS;  Service: Orthopedics;  Laterality: N/A;   Social History   Socioeconomic History   Marital status: Widowed    Spouse name: Not on file   Number of children: Not on file   Years of education: Not on file   Highest education level: Not on file  Occupational History   Not on file  Tobacco Use   Smoking status: Former    Types: Cigarettes   Smokeless tobacco: Never  Vaping Use   Vaping Use: Never used  Substance and Sexual Activity   Alcohol use: Not Currently   Drug use: Never   Sexual activity: Not on file  Other Topics Concern   Not on file  Social History Narrative   Lives at home independently.  Ambulates steadily.   Social Determinants of Health   Financial Resource Strain: Not on file  Food Insecurity: Not on file  Transportation Needs: Not on file  Physical Activity: Not on file  Stress: Not on file  Social Connections: Not on file   Family History  Problem Relation Age of Onset   Breast cancer Sister 25   Breast cancer Sister        36's   CAD Mother    Allergies  Allergen Reactions   Amoxicillin-Pot Clavulanate Diarrhea    Levofloxacin Diarrhea   Buspirone Other (See Comments)   Trazodone Other (See Comments)    nightmares   Prior to Admission medications   Medication Sig Start Date End Date Taking? Authorizing Provider  acetaminophen (TYLENOL) 325 MG tablet Place 650 mg into feeding tube every 6 (six) hours as needed. 11/02/21  Yes [provider]  atorvastatin (LIPITOR) 40 MG tablet Place 40 mg into feeding tube daily. 11/02/21  Yes [provider]  Bismuth Subsalicylate [id number] SUSP Take 15 mLs by mouth every 6 (six) hours as needed. 12/23/21 01/22/22 Yes [provider]  Lactobacillus Rhamnosus, GG, (CULTURELLE) CAPS Take 1 capsule by mouth daily.   Yes [provider]  levothyroxine (SYNTHROID) 75 MCG tablet Take 1 tablet by mouth daily. 03/03/21  Yes [provider]  lisinopril (ZESTRIL) 30 MG tablet Take 30 mg by mouth daily. 12/30/21  Yes [provider]  LORazepam (ATIVAN) 0.5 MG tablet Take 0.5 mg by mouth at bedtime as needed for sleep.   Yes [provider]  melatonin 3 MG TABS tablet Place 3 mg into feeding tube at bedtime. 11/02/21  Yes [provider]  methylPREDNISolone (MEDROL DOSEPAK) 4 MG TBPK tablet Take 6 tablets on day one and decrease by 1 tablet daily until finished.  01/12/22  Yes Triplett, Cari B, FNP  metoprolol tartrate (LOPRESSOR) 25 MG tablet Place 1 tablet (25 mg total) into feeding tube 2 (two) times daily. 11/09/21  Yes Darlin Priestly, MD  oxyCODONE (ROXICODONE) 5 MG immediate release tablet Take 1 tablet (5 mg total) by mouth every 12 (twelve) hours as needed. 01/12/22 01/12/23 Yes Triplett, Cari B, FNP  polyethylene glycol powder (GLYCOLAX/MIRALAX) 17 GM/SCOOP powder Place 17 g into feeding tube. 11/02/21  Yes [provider]  senna (SENOKOT) 8.6 MG tablet Place 8.6 mg into feeding tube at bedtime. 11/02/21  Yes [provider]  sodium chloride 1 g tablet Take 1 tablet (1 g total) by mouth 2 (two) times daily  with a meal. 10/10/21  Yes Wieting, Richard, MD  tiZANidine (ZANAFLEX) 2 MG tablet Take 2 mg by mouth every 8 (eight) hours as needed. 01/08/22 01/08/23 Yes [provider]  XARELTO 20 MG TABS tablet Take 20 mg by mouth daily. 05/12/21  Yes [provider]  hydrocortisone 2.5 % cream Apply 1 application. topically 2 (two) times daily. Apply to upper back. Patient not taking: Reported on 01/16/2022    [provider]  lisinopril (ZESTRIL) 10 MG tablet Take 1 tablet (10 mg total) by mouth daily. Patient not taking: Reported on 01/16/2022 10/11/21   Alford Highland, MD   DG Foot Complete Left  Result Date: 01/16/2022 CLINICAL DATA:  Recent foot pain with palpable abnormality, initial encounter EXAM: LEFT FOOT - COMPLETE 3+ VIEW COMPARISON:  None Available. FINDINGS: Accentuation of the plantar arch is noted. Soft tissue prominence is noted along the dorsum of the foot consistent with the given clinical history. No abnormal calcifications are seen. No acute fracture is noted. Mild medial subluxation of the fifth middle phalanx with respect to the proximal phalanx is noted. Postsurgical changes in the distal first metatarsal are seen. IMPRESSION: No acute bony abnormality noted. Soft tissue abnormality is noted along the dorsum of the foot consistent with the given clinical history. No associated calcifications are noted. Electronically Signed   By: Alcide Clever M.D.   On: 01/16/2022 19:26   I personally reviewed the x-ray and this was negative. Positive ROS: All other systems have been reviewed and were otherwise negative with the exception of those mentioned in the HPI and as above.  12 point ROS was performed.  Physical Exam: General: Alert and oriented.  No apparent distress.  Vascular:  Left foot:Dorsalis Pedis:  diminished Posterior Tibial:  diminished  Right foot: Dorsalis Pedis:  diminished Posterior Tibial:  diminished Capillary fill time is brisk  Neuro:intact  sensation  Derm: The left foot has diffuse ecchymosis with a small focal area of a soft tissue mass consistent with a hematoma.  There is darkened discoloration in the central aspect of this.  She states this has come about quite recent.  She does take blood thinner Xarelto.  No open skin wounds or skin breaks.  Ortho/MS: Mild edema to the left foot compared to the right side.  Assessment: Contusion with hematoma left foot Chronic anticoagulant  Plan: This looks to be consistent with a contusion/hematoma on the dorsal aspect of the left foot.  At this point just continue to closely monitor.  Avoid any acute trauma to the area.  And has to be protected at this point prevent irritation and pressure to the area.  Will defer aspiration at this time.  Avoid any tight fitting shoe gear to the area.  Ambulation as tolerated.  I will see  her back as needed    Irean Hong, [place] Cell [phone]   01/17/2022 9:23 AM

## 2022-01-17 NOTE — ED Notes (Signed)
Pt to admitted to room 156 per MD order, report given to [facility] RN, belongings sent with pt. VSS stable at this time, pt transported by Transport.

## 2022-01-18 ENCOUNTER — Observation Stay: Payer: Medicare Other

## 2022-01-18 DIAGNOSIS — M549 Dorsalgia, unspecified: Secondary | ICD-10-CM | POA: Diagnosis not present

## 2022-01-18 DIAGNOSIS — M545 Low back pain, unspecified: Secondary | ICD-10-CM | POA: Diagnosis not present

## 2022-01-18 LAB — BASIC METABOLIC PANEL
Anion gap: 11 (ref 5–15)
BUN: 19 mg/dL (ref 8–23)
CO2: 32 mmol/L (ref 22–32)
Calcium: 9.9 mg/dL (ref 8.9–10.3)
Chloride: 88 mmol/L — ABNORMAL LOW (ref 98–111)
Creatinine, Ser: 0.69 mg/dL (ref 0.44–1.00)
GFR, Estimated: >60 mL/min (ref >60)
Glucose, Bld: 154 mg/dL — ABNORMAL HIGH (ref 70–99)
Potassium: 4.1 mmol/L (ref 3.5–5.1)
Sodium: 131 mmol/L — ABNORMAL LOW (ref 135–145)

## 2022-01-18 MED ORDER — OXYCODONE HCL 5 MG PO TABS
5.0000 mg | ORAL_TABLET | Freq: Four times a day (QID) | ORAL | Status: DC | PRN
  Administered 2022-01-19 (×2): 5 mg via ORAL
  Filled 2022-01-18 (×2): qty 1

## 2022-01-18 NOTE — Progress Notes (Signed)
PROGRESS NOTE    Kaylee Santiago  NGE:[id number] DOB: October 13, 1934 DOA: 01/16/2022 PCP: Lynnea Ferrier, MD    Brief Narrative:  Kaylee Santiago is a 87 y.o. 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  She is also concerned about the swelling on her foot.  She denied numbness in the groin area or problems with bladder or bowel movements ED course and data review: BP 190/97 with pulse 102 and otherwise normal vitals.  CBC and CMP   X-ray of the foot shows no acute bony abnormality but shows a soft tissue abnormality dorsum of the foot. 7/10 neurosurgery saw LSO brace.    Consultants:  Podiatry Neurosurgery   Procedures:   Antimicrobials:      Subjective: C/o back , but feels some pain meds make her "loopy".   Objective: Vitals:   01/17/22 1528 01/17/22 2000 01/18/22 0341 01/18/22 0850  BP: (!) 172/87 (!) 154/103 (!) 146/80 (!) 146/86  Pulse: 72 98 78 (!) 104  Resp: 15 20 18 18   Temp: 97.9 F (36.6 C) 98.1 F (36.7 C) 98.3 F (36.8 C) 98 F (36.7 C)  TempSrc: Oral     SpO2: 95% 99% 96% 97%  Weight:      Height:        Intake/Output Summary (Last 24 hours) at 01/18/2022 0909 Last data filed at 01/17/2022 1845 Gross per 24 hour  Intake 1200 ml  Output --  Net 1200 ml   Filed Weights   01/16/22 1839  Weight: 52.7 kg    Examination: Calm, NAD Cta no w/r Reg s1/s2 no gallop Soft benign +bs No edema Awake and alert Mood and affect appropriate in current setting     Data Reviewed: I  have personally reviewed following labs and imaging studies  CBC: Recent Labs  Lab 01/16/22 1940  WBC 8.7  NEUTROABS 7.6  HGB 13.9  HCT 41.8  MCV 90.9  PLT 300   Basic Metabolic Panel: Recent Labs  Lab 01/16/22 1940  NA 132*  K 4.7  CL 94*  CO2 28  GLUCOSE 119*  BUN 23  CREATININE 0.65  CALCIUM 9.2   GFR: Estimated Creatinine Clearance: 43.6 mL/min (by C-G formula based on SCr of 0.65 mg/dL). Liver Function Tests: Recent Labs  Lab 01/16/22 1940  AST 29  ALT 24  ALKPHOS 72  BILITOT 0.8  PROT 7.5  ALBUMIN 3.8   No results for input(s): "LIPASE", "AMYLASE" in the last 168 hours. No results for input(s): "AMMONIA" in the last 168 hours. Coagulation Profile: No results for input(s): "INR", "PROTIME" in the last 168 hours. Cardiac Enzymes: No results for input(s): "CKTOTAL", "CKMB", "CKMBINDEX", "TROPONINI" in the last 168 hours. BNP (last 3 results) No results for input(s): "PROBNP" in the last 8760 hours. HbA1C: No results for input(s): "HGBA1C" in the last 72 hours. CBG: No results for input(s): "GLUCAP" in the last 168 hours. Lipid Profile: No results for input(s): "CHOL", "HDL", "LDLCALC", "TRIG", "CHOLHDL", "LDLDIRECT" in the last 72 hours. Thyroid Function Tests: No results for input(s): "  TSH", "T4TOTAL", "FREET4", "T3FREE", "THYROIDAB" in the last 72 hours. Anemia Panel: No results for input(s): "VITAMINB12", "FOLATE", "FERRITIN", "TIBC", "IRON", "RETICCTPCT" in the last 72 hours. Sepsis Labs: No results for input(s): "PROCALCITON", "LATICACIDVEN" in the last 168 hours.  No results found for this or any previous visit (from the past 240 hour(s)).       Radiology Studies: DG Foot Complete Left  Result Date: 01/16/2022 CLINICAL DATA:  Recent foot pain with palpable abnormality, initial encounter EXAM: LEFT FOOT - COMPLETE 3+ VIEW COMPARISON:  None Available. FINDINGS: Accentuation of the plantar arch is noted. Soft tissue prominence is noted along  the dorsum of the foot consistent with the given clinical history. No abnormal calcifications are seen. No acute fracture is noted. Mild medial subluxation of the fifth middle phalanx with respect to the proximal phalanx is noted. Postsurgical changes in the distal first metatarsal are seen. IMPRESSION: No acute bony abnormality noted. Soft tissue abnormality is noted along the dorsum of the foot consistent with the given clinical history. No associated calcifications are noted. Electronically Signed   By: Alcide Clever M.D.   On: 01/16/2022 19:26        Scheduled Meds:  atorvastatin  40 mg Per Tube Daily   levothyroxine  75 mcg Oral Q0600   melatonin  5 mg Per Tube QHS   metoprolol tartrate  25 mg Per Tube BID   rivaroxaban  20 mg Oral Daily   Continuous Infusions:  Assessment & Plan:   Principal Problem:   Intractable back pain Active Problems:   AF (paroxysmal atrial fibrillation) (HCC)   Chronic anticoagulation   Localized swelling of left foot   Hypothyroidism   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(HCC)   Closed compression fracture of L3 lumbar vertebra, with routine healing, subsequent encounter   History of CVA with residual left hemiparesis, dysarthria and dysphagia  Intractable back pain Multiple lumbar compression fractures, chronic and subacute Found on MRI Neurosurgery input was appreciated. No surgical intervention needed LSO brace Uprigh xray to evlauate progression/use of outpt comparison Pain mx. Will dc iv pain med and transition to po to see if can control     Localized swelling of left foot Bruise left foot Podiatry consulted input was appreciated.  Likely hematoma oozing of the foot. It was aspirated by bedside, but nothing aspirated.For now will observe Dressing applied Observe for now 7/10 f/u with [facility] clinic podiatry Dr. Ether Griffins as outpt.      AF (paroxysmal atrial fibrillation) (HCC) Chronic  anticoagulation On xarelto and beta blk   Hypothyroidism Continue levothyroxine   History of CVA with residual left hemiparesis, dysarthria and dysphagia Increase nursing assistance for related deficits Continue Xarelto and statins   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(HCC) Continue tube feeds         DVT prophylaxis: Xarelto Code Status: Full Family Communication: None at bedside Disposition Plan: Likely home Status is: Observation The patient remains OBS appropriate and will d/c before 2 midnights.        LOS: 0 days   Time spent: 35 minutes    Lynn Ito, MD [facility] Pager [phone]  If 7PM-7AM, please contact night-coverage 01/18/2022, 9:09 AM

## 2022-01-18 NOTE — Evaluation (Signed)
Physical Therapy Evaluation Patient Details Name: Kaylee Santiago MRN: [id number] DOB: [birth year] Today's Date: 01/18/2022  History of Present Illness  Pt is an 87 y.o. 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   Clinical Impression  Patient alert, asking to go to the bathroom upon PT entrance. No LSO in room at this time. The patient was very verbose but reported she lives at [facility], that assists with meals/cleaning. Normally ambulatory without AD and independent for ADLs but her pain has been a major limiting factor.  The patient was sitting EOB, able to sit <> Stand with RW modII. In total she ambulated ~47ft (declined further mobility at this time due to pain 10/10 and lab tech in room). She was able to perform pericare modI/I and washed her hands at the sink as well. Returned to supine modI with extended time. Overall the patients may limiting factor is back pain, and pt was educated on use of brace (when it arrives), and use of RW for pain management. Recommendation at this time is HHPT with intermittent supervision/assistance for further education on pain management/techniques and to maximize pt independence.     Recommendations for follow up therapy are one component of a multi-disciplinary discharge planning process, led by the attending physician.  Recommendations may be updated based on patient status, additional functional criteria and insurance authorization.  Follow Up Recommendations Home health PT      Assistance Recommended at Discharge Intermittent Supervision/Assistance  Patient can  return home with the following  Assistance with cooking/housework;Assist for transportation;Help with stairs or ramp for entrance    Equipment Recommendations Rolling walker (2 wheels);BSC/3in1  Recommendations for Other Services       Functional Status Assessment Patient has had a recent decline in their functional status and demonstrates the ability to make significant improvements in function in a reasonable and predictable amount of time.     Precautions / Restrictions Precautions Precautions: Fall Required Braces or Orthoses: Spinal Brace Spinal Brace: Applied in sitting position;Other (comment) Spinal Brace Comments: LSO Restrictions Weight Bearing Restrictions: No Other Position/Activity Restrictions: LSO ( not in room but pt asking to go to the bathroom)      Mobility  Bed Mobility Overal bed mobility: Modified Independent                  Transfers Overall transfer level: Modified independent Equipment used: Rolling walker (2 wheels)               General transfer comment: safe placement of hands    Ambulation/Gait Ambulation/Gait assistance: Min guard Gait Distance (Feet): 30 Feet Assistive device: Rolling walker (2 wheels)         General Gait Details: generally decreased velocity, reciprocal gait pattern, pt more limited by pain  Stairs            Wheelchair Mobility    Modified Rankin (Stroke Patients Only)       Balance Overall balance assessment: Needs assistance Sitting-balance support: Feet supported Sitting balance-Leahy Scale: Good Sitting balance - Comments: pericare independently   Standing balance support: Single extremity supported Standing balance-Leahy Scale: Good Standing balance  comment: able to stand at sink and wash her hands                             Pertinent Vitals/Pain Pain Assessment Pain Assessment: 0-10 Pain Score: 10-Worst pain ever Pain Descriptors / Indicators: Aching, Grimacing,  Sore, Guarding, Moaning Pain Intervention(s): Limited activity within patient's tolerance, Monitored during session, Repositioned, Patient requesting pain meds-RN notified    Home Living Family/patient expects to be discharged to:: Assisted living                 Home Equipment: Rollator (4 wheels);Toilet riser (tripod walker)      Prior Function Prior Level of Function : Needs assist             Mobility Comments: Pt reports prior to initial stroke she was independent without AD, driving. transitioned to [facility], normally ambulatory without AD ADLs Comments: ALF assists with meals and cleaning. pt able to perform ADLs modI/I     Hand Dominance   Dominant Hand: Left    Extremity/Trunk Assessment   Upper Extremity Assessment Upper Extremity Assessment: Generalized weakness    Lower Extremity Assessment Lower Extremity Assessment: Generalized weakness    Cervical / Trunk Assessment Cervical / Trunk Assessment: Kyphotic  Communication      Cognition Arousal/Alertness: Awake/alert Behavior During Therapy: WFL for tasks assessed/performed Overall Cognitive Status: Within Functional Limits for tasks assessed                                          General Comments      Exercises     Assessment/Plan    PT Assessment Patient needs continued PT services  PT Problem List Decreased strength;Decreased mobility;Decreased activity tolerance;Decreased range of motion;Decreased balance;Pain       PT Treatment Interventions DME instruction;Therapeutic exercise;Gait training;Balance training;Neuromuscular re-education;Functional mobility training;Therapeutic activities;Patient/family education    PT Goals (Current goals can be found in the Care Plan section)  Acute Rehab PT Goals Patient Stated Goal: to have less pain PT Goal Formulation: With patient Time For Goal Achievement: 02/01/22 Potential to Achieve Goals: Good    Frequency Min  2X/week     Co-evaluation               AM-PAC PT "6 Clicks" Mobility  Outcome Measure Help needed turning from your back to your side while in a flat bed without using bedrails?: None Help needed moving from lying on your back to sitting on the side of a flat bed without using bedrails?: None Help needed moving to and from a bed to a chair (including a wheelchair)?: None Help needed standing up from a chair using your arms (e.g., wheelchair or bedside chair)?: None Help needed to walk in hospital room?: None Help needed climbing 3-5 steps with a railing? : A Little 6 Click Score: 23    End of Session Equipment Utilized During Treatment: Gait belt Activity Tolerance: Patient tolerated treatment well;Patient limited by pain Patient left: in bed;with call bell/phone within reach Nurse Communication: Mobility status PT Visit Diagnosis: Other abnormalities of gait and mobility (R26.89);Difficulty in walking, not elsewhere classified (R26.2);Muscle weakness (generalized) (M62.81);Pain Pain - Right/Left:  (midline) Pain - part of body:  (low back pain)    Time: 9735-3299 PT Time Calculation (min) (ACUTE ONLY): 14 min   Charges:   PT  Evaluation $PT Eval Low Complexity: 1 Low PT Treatments $Therapeutic Activity: 8-22 mins        Olga Coaster PT, DPT 9:35 AM,01/18/22

## 2022-01-18 NOTE — Consult Note (Signed)
Neurosurgery-New Consultation Evaluation 01/18/2022 Kaylee Santiago [id number]  Identifying Statement: Shawnya Mayor Santiago is a 87 y.o. female from [place] [address] with a history of A-Fib on Xarelton, anxiety, HTN, CVA x 11 October 2021, dysarthria, dysphagia requiring PEG.   Physician Requesting Consultation: No ref. provider found  History of Present Illness: Kaylee Santiago is a 87 y.o initially presenting to the ER from [facility] on 01/12/22 for acute on chronic low back pain with radiating into her right buttock and leg. MRI L spine showed subacute L3 fracture with mild bilateral L3 foraminal stenosis. She was treated conservatively with plans to follow up outpatient. She represented to the ER on 01/16/22 for continued intractable back pain and swelling in her left foot and was admitted to internal medicine and Neurosurgery was consulted for further recommendations.  Today Ms. Spear reports continued low back pain that is largely unchanged in severity over the last week. She does report some right sided lateral leg pain that radiates into her calf but states this is not as severe as her low back pain. She admits to some confusion since her CVA in April and worsening so since taking oral pain medication for her back pain.   Past Medical History:  Past Medical History:  Diagnosis Date   Atrial fibrillation (HCC)    Atrial fibrillation (HCC)    Back pain    Dysrhythmia    Hypertension    Hypothyroidism    Stroke [facility])     Social History: Social History   Socioeconomic History   Marital status: Widowed    Spouse name: Not on file   Number of children: Not on file   Years of education: Not on file   Highest education level: Not on file  Occupational History   Not on file  Tobacco Use   Smoking status: Former    Types: Cigarettes   Smokeless tobacco: Never  Vaping Use   Vaping Use: Never used  Substance and Sexual Activity   Alcohol use: Not Currently   Drug use: Never   Sexual activity:  Not on file  Other Topics Concern   Not on file  Social History Narrative   Lives at home independently.  Ambulates steadily.   Social Determinants of Health   Financial Resource Strain: Not on file  Food Insecurity: Not on file  Transportation Needs: Not on file  Physical Activity: Not on file  Stress: Not on file  Social Connections: Not on file  Intimate Partner Violence: Not on file    Family History: Family History  Problem Relation Age of Onset   Breast cancer Sister 55   Breast cancer Sister        39's   CAD Mother     Review of Systems:  Review of Systems - General ROS: Negative Psychological ROS: Negative Ophthalmic ROS: Negative ENT ROS: Negative Hematological and Lymphatic ROS: Negative  Endocrine ROS: Negative Respiratory ROS: Negative Cardiovascular ROS: Negative Gastrointestinal ROS: Negative Genito-Urinary ROS: Negative Musculoskeletal ROS: Negative Neurological ROS: Negative Dermatological ROS: Negative  Physical Exam: BP (!) 146/86 (BP Location: Right Arm)   Pulse (!) 104   Temp 98 F (36.7 C)   Resp 18   Ht 5\' 10"  (1.778 m)   Wt 52.7 kg   SpO2 97%   BMI 16.67 kg/m  Body mass index is 16.67 kg/m. Body surface area is 1.61 meters squared. General appearance: Alert, cooperative, in no acute distress Head: Normocephalic, atraumatic Eyes: Normal, EOM intact Oropharynx: Moist without  lesions Neck: Supple, no tenderness Lungs: good air exchange Abdomen: Soft, nondistended Ext: No edema in LE bilaterally, good distal pulses  Neurologic exam:  Mental status: alertness: alert, orientation: person, place. Some confusion surrounding short term events  Speech: fluent and clear Cranial nerves:  CN II-XII grossly intact  Motor:strength symmetric 5/5, normal muscle mass and tone in all extremities and no pronator drift Sensory: intact to light touch in all extremities Reflexes: 2+ and symmetric bilaterally for arms and legs Coordination:  intact finger to nose Gait: pt up at sink brushing teeth and ambulated back to back independently.   Laboratory: Results for orders placed or performed during the hospital encounter of 01/16/22  CBC with Differential  Result Value Ref Range   WBC 8.7 4.0 - 10.5 K/uL   RBC 4.60 3.87 - 5.11 MIL/uL   Hemoglobin 13.9 12.0 - 15.0 g/dL   HCT 35.5 97.4 - 16.3 %   MCV 90.9 80.0 - 100.0 fL   MCH 30.2 26.0 - 34.0 pg   MCHC 33.3 30.0 - 36.0 g/dL   RDW 84.5 36.4 - 68.0 %   Platelets 300 150 - 400 K/uL   nRBC 0.0 0.0 - 0.2 %   Neutrophils Relative % 87 %   Neutro Abs 7.6 1.7 - 7.7 K/uL   Lymphocytes Relative 5 %   Lymphs Abs 0.4 (L) 0.7 - 4.0 K/uL   Monocytes Relative 8 %   Monocytes Absolute 0.7 0.1 - 1.0 K/uL   Eosinophils Relative 0 %   Eosinophils Absolute 0.0 0.0 - 0.5 K/uL   Basophils Relative 0 %   Basophils Absolute 0.0 0.0 - 0.1 K/uL   Immature Granulocytes 0 %   Abs Immature Granulocytes 0.03 0.00 - 0.07 K/uL  Comprehensive metabolic panel  Result Value Ref Range   Sodium 132 (L) 135 - 145 mmol/L   Potassium 4.7 3.5 - 5.1 mmol/L   Chloride 94 (L) 98 - 111 mmol/L   CO2 28 22 - 32 mmol/L   Glucose, Bld 119 (H) 70 - 99 mg/dL   BUN 23 8 - 23 mg/dL   Creatinine, Ser 3.21 0.44 - 1.00 mg/dL   Calcium 9.2 8.9 - 22.4 mg/dL   Total Protein 7.5 6.5 - 8.1 g/dL   Albumin 3.8 3.5 - 5.0 g/dL   AST 29 15 - 41 U/L   ALT 24 0 - 44 U/L   Alkaline Phosphatase 72 38 - 126 U/L   Total Bilirubin 0.8 0.3 - 1.2 mg/dL   GFR, Estimated >82 >50 mL/min   Anion gap 10 5 - 15  Basic metabolic panel  Result Value Ref Range   Sodium 131 (L) 135 - 145 mmol/L   Potassium 4.1 3.5 - 5.1 mmol/L   Chloride 88 (L) 98 - 111 mmol/L   CO2 32 22 - 32 mmol/L   Glucose, Bld 154 (H) 70 - 99 mg/dL   BUN 19 8 - 23 mg/dL   Creatinine, Ser 0.37 0.44 - 1.00 mg/dL   Calcium 9.9 8.9 - 04.8 mg/dL   GFR, Estimated >88 >91 mL/min   Anion gap 11 5 - 15    Imaging:  MRI L spine 01/12/22 IMPRESSION: 1. Subacute  appearing L3 compression fracture with 30-35% loss of vertebral body height. No significant retropulsion, but new bilateral L3 neural foraminal stenosis.   2. Stable chronic L1, L2, and L5 compression fractures. Chronic L2 retropulsion and spinal and foraminal stenosis at both L1-L2 and L2-L3.     Electronically Signed  By: Odessa Fleming M.D.   On: 01/12/2022 08:44  I personally reviewed radiology studies to include:   Impression/Plan:     1.  Diagnosis: L3 compression fracture   2.  Plan - LSO brace. I have placed an order for this - Ordered upright xrays to evaluate progression/ use for outpatient comparison.  - no plan for acute neurosurgical intervention at this time. Please continue with outpatient follow up as initially planned.  - Please call with any questions or concerns.  Manning Charity PA-C Neurosurgery

## 2022-01-18 NOTE — Plan of Care (Signed)

## 2022-01-18 NOTE — Telephone Encounter (Signed)
Noted  

## 2022-01-18 NOTE — Progress Notes (Signed)
OT Cancellation Note  Patient Details Name: Kaylee Santiago MRN: [id number] DOB: [birth year]   Cancelled Treatment:    Reason Eval/Treat Not Completed: Other (comment). Consult received, chart reviewed. Pt with nursing for care. Pt noting she just took pain meds and reporting 7/10 back pain. RN notes back brace won't work and taken back. Will clarify with care team and re-attempt OT evaluation tomorrow morning, timing with pain medication for optimal pain control.   Arman Filter., MPH, MS, OTR/L ascom [phone] 01/18/22, 4:50 PM

## 2022-01-18 NOTE — Progress Notes (Signed)
Orthopedic Tech Progress Note Patient Details:  [name] [birth year] [id number]  Secretary called requesting a LSO BRACE, I told her an order has been called in on the 9th, but I was told brace never made it to the hospital so I called the order in STAT to [facility] for a LSO BRACE   Patient ID: Kaylee Santiago, female   DOB: [birth year], 87 y.o.   MRN: [id number]  [name] 01/18/2022, 1:40 PM

## 2022-01-18 NOTE — TOC Initial Note (Signed)
Transition of Care [facility]) - Initial/Assessment Note    Patient Details  Name: Kaylee Santiago MRN: [id number] Date of Birth: Jun 22, 1935  Transition of Care [facility] CM/SW Contact:    Caryn Section, RN Phone Number: 01/18/2022, 3:02 PM  Clinical Narrative:    Patient is a resident of [facility].  As per Judeth Cornfield and Tiffany at facility, patient is able to return to facility at discharge.  Assessment by facility not required at this time.  [facility] or family to transport depending on availability and time of day.  Patient is receiving continued medical workup today, facility made aware.               Expected Discharge Plan: Assisted Living Barriers to Discharge: Continued Medical Work up   Patient Goals and CMS Choice        Expected Discharge Plan and Services Expected Discharge Plan: Assisted Living   Discharge Planning Services: CM Consult   Living arrangements for the past 2 months: Assisted Living Facility                                      Prior Living Arrangements/Services Living arrangements for the past 2 months: Assisted Living Facility Lives with:: Facility Resident Patient language and need for interpreter reviewed:: Yes Do you feel safe going back to the place where you live?: Yes      Need for Family Participation in Patient Care: Yes (Comment) Care giver support system in place?: Yes (comment)   Criminal Activity/Legal Involvement Pertinent to Current Situation/Hospitalization: No - Comment as needed  Activities of Daily Living Home Assistive Devices/Equipment: Walker (specify type) ADL Screening (condition at time of admission) Patient's cognitive ability adequate to safely complete daily activities?: No Is the patient deaf or have difficulty hearing?: No Does the patient have difficulty seeing, even when wearing glasses/contacts?: No Does the patient have difficulty concentrating, remembering, or making decisions?: No Patient able to  express need for assistance with ADLs?: Yes Does the patient have difficulty dressing or bathing?: Yes Independently performs ADLs?: Yes (appropriate for developmental age) Does the patient have difficulty walking or climbing stairs?: Yes Weakness of Legs: None Weakness of Arms/Hands: Left  Permission Sought/Granted Permission sought to share information with : Case Manager Permission granted to share information with : Yes, Verbal Permission Granted     Permission granted to share info w AGENCY: [facility]        Emotional Assessment         Alcohol / Substance Use: Not Applicable Psych Involvement: No (comment)  Admission diagnosis:  Intractable back pain [M54.9] Contusion of left foot, initial encounter [S90.32XA] Mass of left foot [R22.42] Low back pain, unspecified back pain laterality, unspecified chronicity, unspecified whether sciatica present [M54.50] Patient Active Problem List   Diagnosis Date Noted   Localized swelling of left foot 01/17/2022   Intractable back pain 01/16/2022   Closed compression fracture of L3 lumbar vertebra, with routine healing, subsequent encounter 01/16/2022   History of CVA with residual left hemiparesis, dysarthria and dysphagia 01/16/2022   Acute CVA (cerebrovascular accident) (HCC) 11/07/2021   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(HCC)    Chronic anticoagulation    Hemiparesis affecting left side as late effect of cerebrovascular accident (CVA) (HCC)    Dysphagia due to recent cerebrovascular accident    Dysarthria as late effect of cerebrovascular disease  Headache 10/09/2021   Protein-calorie malnutrition, severe 09/23/2021   Hypertensive urgency 09/21/2021   History of COVID-19 09/21/2021   Lower extremity edema 09/21/2021   Hypophosphatemia    COPD with acute exacerbation (HCC)    Hyponatremia 07/01/2021   Bronchopneumonia 07/01/2021   Pneumonia due to COVID-19 virus 07/01/2021    Chronic respiratory failure with hypoxia (HCC) 07/01/2021   History of epistaxis 07/01/2021   Acute metabolic encephalopathy 07/01/2021   Malnutrition of moderate degree 04/10/2021   Acute blood loss anemia 04/10/2021   Abnormal CT of the abdomen 04/10/2021   Acute on chronic respiratory failure with hypoxia (HCC) 04/09/2021   Epistaxis 04/09/2021   AF (paroxysmal atrial fibrillation) (HCC)    Hypothyroidism    Hypertension    Bronchiectasis without complication (HCC) 06/13/2019   PCP:  Lynnea Ferrier, MD Pharmacy:  No Pharmacies Listed    Social Determinants of Health (SDOH) Interventions    Readmission Risk Interventions    11/09/2021   10:12 AM 10/10/2021   10:44 AM  Readmission Risk Prevention Plan  Transportation Screening Complete Complete  PCP or Specialist Appt within 5-7 Days Complete   PCP or Specialist Appt within 3-5 Days  Complete  Medication Review (RN CM) Complete   HRI or Home Care Consult  Complete  Social Work Consult for Recovery Care Planning/Counseling  Complete  Palliative Care Screening  Not Applicable  Medication Review Oceanographer)  Complete

## 2022-01-19 DIAGNOSIS — M549 Dorsalgia, unspecified: Secondary | ICD-10-CM | POA: Diagnosis not present

## 2022-01-19 DIAGNOSIS — M545 Low back pain, unspecified: Secondary | ICD-10-CM | POA: Diagnosis not present

## 2022-01-19 MED ORDER — OXYCODONE HCL 5 MG PO TABS: 5.0000 mg | ORAL_TABLET | Freq: Four times a day (QID) | ORAL | 0 refills | Status: AC | PRN

## 2022-01-19 MED ORDER — ACETAMINOPHEN 325 MG PO TABS: 650.0000 mg | ORAL_TABLET | Freq: Four times a day (QID) | ORAL | Status: AC | PRN

## 2022-01-19 NOTE — Discharge Summary (Signed)
Emika Tiano Shapley FUX:[id number] DOB: 25-Aug-1934 DOA: 01/16/2022  PCP: Lynnea Ferrier, MD  Admit date: 01/16/2022 Discharge date: 01/19/2022  Admitted From: home Disposition:  home  Recommendations for Outpatient Follow-up:  Follow up with PCP in 1 week Please obtain BMP/CBC in one week Please follow up neurosurgery Dr. Marcell Barlow in 2 weeks Follow-up with podiatry Dr. Vivia Birmingham in 1 week  Home Health:yes   Discharge Condition:Stable CODE STATUS:full  Diet recommendation: Heart Healthy  Brief/Interim Summary: Per HPI: Ajahnae Rathgeber [place] is a 87 y.o. 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  She is also concerned about the swelling on her foot.  She denied numbness in the groin area or problems with bladder or bowel movements ED course and data review: BP 190/97 with pulse 102 and otherwise normal vitals.  CBC and CMP unremarkable.X-ray of the foot shows no acute bony abnormality but shows a soft tissue abnormality dorsum of the foot. Podiatry and neurosurgery was consulted.  Intractable back pain Multiple lumbar compression fractures, chronic and subacute Found on MRI Neurosurgery input was appreciated. No surgical intervention needed LSO brace ordered by patient refused it due to her back which are the brace was causing more pain Uprigh xray to evlauate progression/use of outpt comparison>>>stable Pain mx.        Localized swelling of left foot Bruise left foot Podiatry consulted input was appreciated.   Likely hematoma oozing of the foot. It was aspirated by bedside, but nothing aspirated.For now will observe Dressing applied Observe for now f/u with [facility] podiatry Dr. Ether Griffins as outpt.       AF (paroxysmal atrial fibrillation) (HCC) Chronic anticoagulation On xarelto and beta blk   Hypothyroidism Continue levothyroxine     History of CVA with residual left hemiparesis, dysarthria and dysphagia Continue Xarelto and statins   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(HCC) Continue tube feeds        Discharge Diagnoses:  Principal Problem:   Intractable back pain Active Problems:   AF (paroxysmal atrial fibrillation) (HCC)   Chronic anticoagulation   Localized swelling of left foot   Hypothyroidism   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 Do not go over 3 grams of tylenol in 24 hrs.   Increase activity slowly   Complete by: As directed       Allergies as of 01/19/2022       Reactions   Amoxicillin-pot Clavulanate Diarrhea   Levofloxacin Diarrhea   Buspirone Other (See Comments)   Trazodone Other (  See Comments)   nightmares        Medication List     STOP taking these medications    methylPREDNISolone 4 MG Tbpk tablet Commonly known as: MEDROL DOSEPAK       TAKE these medications    acetaminophen 325 MG tablet Commonly known as: TYLENOL Take 2 tablets (650 mg total) by mouth every 6 (six) hours as needed for  mild pain (or Fever >/= 101). What changed:  how to take this reasons to take this   atorvastatin 40 MG tablet Commonly known as: LIPITOR Place 40 mg into feeding tube daily.   Bismuth Subsalicylate 525 MG/15ML Susp Take 15 mLs by mouth every 6 (six) hours as needed.   Culturelle Caps Take 1 capsule by mouth daily.   hydrocortisone 2.5 % cream Apply 1 application. topically 2 (two) times daily. Apply to upper back.   levothyroxine 75 MCG tablet Commonly known as: SYNTHROID Take 1 tablet by mouth daily.   lisinopril 30 MG tablet Commonly known as: ZESTRIL Take 30 mg by mouth daily. What changed: Another medication with the same name was removed. Continue taking this medication, and follow the directions you see here.   LORazepam 0.5 MG tablet Commonly known as: ATIVAN Take 0.5 mg by mouth at bedtime as needed for sleep.   melatonin 3 MG Tabs tablet Place 3 mg into feeding tube at bedtime.   metoprolol tartrate 25 MG tablet Commonly known as: LOPRESSOR Place 1 tablet (25 mg total) into feeding tube 2 (two) times daily.   oxyCODONE 5 MG immediate release tablet Commonly known as: Roxicodone Take 1 tablet (5 mg total) by mouth every 6 (six) hours as needed for up to 2 days for moderate pain or severe pain. What changed:  when to take this reasons to take this   polyethylene glycol powder 17 GM/SCOOP powder Commonly known as: GLYCOLAX/MIRALAX Place 17 g into feeding tube.   senna 8.6 MG tablet Commonly known as: SENOKOT Place 8.6 mg into feeding tube at bedtime.   sodium chloride 1 g tablet Take 1 tablet (1 g total) by mouth 2 (two) times daily with a meal.   tiZANidine 2 MG tablet Commonly known as: ZANAFLEX Take 2 mg by mouth every 8 (eight) hours as needed.   Xarelto 20 MG Tabs tablet Generic drug: rivaroxaban Take 20 mg by mouth daily.        Follow-up Information     Curtis Sites III, MD Follow up in 1 week(s).   Specialty: Internal  Medicine Contact information: [address] [facility] [place] [address] [phone]         Gwyneth Revels, DPM Follow up in 1 week(s).   Specialty: Podiatry Contact information: [address] [place] [address] [phone]         Venetia Night, MD Follow up in 2 week(s).   Specialty: Neurosurgery Contact information: [address] [place] [address] [phone]                Allergies  Allergen Reactions   Amoxicillin-Pot Clavulanate Diarrhea   Levofloxacin Diarrhea   Buspirone Other (See Comments)   Trazodone Other (See Comments)    nightmares    Consultations: Dietary and neurosurgery   Procedures/Studies: DG Lumbar Spine 2-3 Views  Result Date: 01/18/2022 CLINICAL DATA:  Status post lumbar spine operation EXAM: LUMBAR SPINE - 2-3 VIEW COMPARISON:  MRI lumbar spine 01/12/2022 FINDINGS: Dextroconvex upper and levoconvex lower lumbar  curvature. There is in unchanged chronic L1 compression deformity with prior vertebroplasty without progressive height loss. There is an unchanged chronic compression deformity of L2 without evidence of progressive height loss. There is progressive height loss of an L3 compression fracture, with up to 80% height loss. Stable compression deformity of L5. There is with 2 level degenerative disc disease and facet arthropathy as demonstrated on recent lumbar spine MRI. There is chronic bony retropulsion at L2. IMPRESSION: Progressive height loss of the L3 compression fracture, now up to 80%. Unchanged chronic L1, L2, and L5 compression fractures. Electronically Signed   By: Maurine Simmering M.D.   On: 01/18/2022 14:23   DG Foot Complete Left  Result Date: 01/16/2022 CLINICAL DATA:  Recent foot pain with palpable abnormality, initial encounter EXAM: LEFT FOOT - COMPLETE 3+ VIEW COMPARISON:  None Available. FINDINGS: Accentuation of the plantar arch is noted. Soft tissue prominence is  noted along the dorsum of the foot consistent with the given clinical history. No abnormal calcifications are seen. No acute fracture is noted. Mild medial subluxation of the fifth middle phalanx with respect to the proximal phalanx is noted. Postsurgical changes in the distal first metatarsal are seen. IMPRESSION: No acute bony abnormality noted. Soft tissue abnormality is noted along the dorsum of the foot consistent with the given clinical history. No associated calcifications are noted. Electronically Signed   By: Inez Catalina M.D.   On: 01/16/2022 19:26   MR LUMBAR SPINE WO CONTRAST  Result Date: 01/12/2022 CLINICAL DATA:  87 year old female with back pain radiating to the right hip and side. EXAM: MRI LUMBAR SPINE WITHOUT CONTRAST TECHNIQUE: Multiplanar, multisequence MR imaging of the lumbar spine was performed. No intravenous contrast was administered. COMPARISON:  Lumbar MRI 01/14/2021. CT Abdomen and Pelvis 04/08/2021. FINDINGS: Segmentation: Normal on the comparison CT, which is the same numbering system used on the MRI last year. Alignment: Reversal of upper lumbar lordosis has not significantly changed. Chronic dextroconvex upper and levoconvex lower lumbar scoliosis. Vertebrae: New L3 compression fracture. Both the superior and inferior endplates appear deformed, with 30-35% loss of L3 vertebral body height. Patchy mild to moderate marrow edema in the body is eccentric to the left. There is some marrow edema in the right pedicle. Left pedicle and other posterior elements appear to remain intact. Chronic L1 (previously augmented) and L2 compression fractures. Degenerative appearing L2 inferior and left posterolateral marrow edema is only mildly progressed from last year. And L2 retropulsion and conjunction with L1-L2 and L2-L3 disc osteophyte complex is stable. Mild chronic L5 superior endplate compression is stable. Chronic right L4 pedicle benign hemangioma. Background bone marrow signal remains  normal. Intact visible sacrum. Conus medullaris and cauda equina: Conus extends to the T12-L1 level. No lower spinal cord or conus signal abnormality. Paraspinal and other soft tissues: Mild mostly lateral paraspinal edema centered at the L2-L3 level. No paraspinal fluid collection. Stable and negative visible abdominal viscera. Disc levels: Stable chronic spinal and foraminal stenosis at L1-L2 and L2-L3 in part related to chronic L2 retropulsion. Mild new bilateral L3 foraminal stenosis related to the L3 compression. No new lumbar spinal stenosis. IMPRESSION: 1. Subacute appearing L3 compression fracture with 30-35% loss of vertebral body height. No significant retropulsion, but new bilateral L3 neural foraminal stenosis. 2. Stable chronic L1, L2, and L5 compression fractures. Chronic L2 retropulsion and spinal and foraminal stenosis at both L1-L2 and L2-L3. Electronically Signed   By: Genevie Ann M.D.   On: 01/12/2022 08:44  Subjective: No new complaints. With back pain  Discharge Exam: Vitals:   01/19/22 0442 01/19/22 0826  BP: (!) 155/82 (!) 161/92  Pulse: 79 98  Resp: 17   Temp: 97.9 F (36.6 C) 97.8 F (36.6 C)  SpO2: 93% 95%   Vitals:   01/18/22 2023 01/18/22 2025 01/19/22 0442 01/19/22 0826  BP: (!) 145/98 (!) 150/79 (!) 155/82 (!) 161/92  Pulse: 89 87 79 98  Resp: 17  17   Temp: 98.2 F (36.8 C)  97.9 F (36.6 C) 97.8 F (36.6 C)  TempSrc:    Oral  SpO2: 96% 98% 93% 95%  Weight:      Height:        General: Pt is alert, awake, not in acute distress Cardiovascular: RRR, S1/S2 +, no rubs, no gallops Respiratory: CTA bilaterally, no wheezing, no rhonchi Abdominal: Soft, NT, ND, bowel sounds + Extremities: no edema, no cyanosis    The results of significant diagnostics from this hospitalization (including imaging, microbiology, ancillary and laboratory) are listed below for reference.     Microbiology: No results found for this or any previous visit (from the past  240 hour(s)).   Labs: BNP (last 3 results) Recent Labs    09/21/21 1934  BNP [id number]*   Basic Metabolic Panel: Recent Labs  Lab 01/16/22 1940 01/18/22 0922  NA 132* 131*  K 4.7 4.1  CL 94* 88*  CO2 28 32  GLUCOSE 119* 154*  BUN 23 19  CREATININE 0.65 0.69  CALCIUM 9.2 9.9   Liver Function Tests: Recent Labs  Lab 01/16/22 1940  AST 29  ALT 24  ALKPHOS 72  BILITOT 0.8  PROT 7.5  ALBUMIN 3.8   No results for input(s): "LIPASE", "AMYLASE" in the last 168 hours. No results for input(s): "AMMONIA" in the last 168 hours. CBC: Recent Labs  Lab 01/16/22 1940  WBC 8.7  NEUTROABS 7.6  HGB 13.9  HCT 41.8  MCV 90.9  PLT 300   Cardiac Enzymes: No results for input(s): "CKTOTAL", "CKMB", "CKMBINDEX", "TROPONINI" in the last 168 hours. BNP: Invalid input(s): "POCBNP" CBG: No results for input(s): "GLUCAP" in the last 168 hours. D-Dimer No results for input(s): "DDIMER" in the last 72 hours. Hgb A1c No results for input(s): "HGBA1C" in the last 72 hours. Lipid Profile No results for input(s): "CHOL", "HDL", "LDLCALC", "TRIG", "CHOLHDL", "LDLDIRECT" in the last 72 hours. Thyroid function studies No results for input(s): "TSH", "T4TOTAL", "T3FREE", "THYROIDAB" in the last 72 hours.  Invalid input(s): "FREET3" Anemia work up No results for input(s): "VITAMINB12", "FOLATE", "FERRITIN", "TIBC", "IRON", "RETICCTPCT" in the last 72 hours. Urinalysis    Component Value Date/Time   COLORURINE YELLOW (A) 11/07/2021 2057   APPEARANCEUR HAZY (A) 11/07/2021 2057   LABSPEC 1.009 11/07/2021 2057   PHURINE 8.0 11/07/2021 2057   GLUCOSEU NEGATIVE 11/07/2021 2057   HGBUR NEGATIVE 11/07/2021 2057   BILIRUBINUR NEGATIVE 11/07/2021 2057   KETONESUR NEGATIVE 11/07/2021 2057   PROTEINUR NEGATIVE 11/07/2021 2057   NITRITE NEGATIVE 11/07/2021 2057   LEUKOCYTESUR SMALL (A) 11/07/2021 2057   Sepsis Labs Recent Labs  Lab 01/16/22 1940  WBC 8.7   Microbiology No results found  for this or any previous visit (from the past 240 hour(s)).   Time coordinating discharge: Over 30 minutes  SIGNED:   Nolberto Hanlon, MD  [facility] 01/19/2022, 9:36 AM Pager   If 7PM-7AM, please contact night-coverage [email or web address] Password [email or web address]

## 2022-01-19 NOTE — Evaluation (Signed)
Occupational Therapy Evaluation Patient Details Name: Kaylee Santiago MRN: [id number] DOB: April 21, 1935 Today's Date: 01/19/2022   History of Present Illness Pt is an 87 y.o. 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   Clinical Impression   Pt was seen for OT evaluation this date. Prior to hospital admission, pt was ambulating without AD and receiving limited assist from staff at her ALF for bathing, dressing, and IADL tasks. Currently pt demonstrates impairments as described below (See OT problem list) which functionally limit her ability to perform ADL/Paglia-care tasks. Pt completed grooming tasks standing at the sink with CGA, requires MIN A for LB dressing/bathing using figure 4 or bringing foot up onto the EOB, and CGA for ADL mobility with RW. Pt educated in home/routines modifications, falls prevention, strategies to minimize back pain during ADL/mobility; handout provided. Pt would benefit from skilled OT services to address noted impairments and functional limitations (see below for any additional details) in order to maximize safety and independence while minimizing falls risk and caregiver burden. Upon hospital discharge, recommend HHOT + 24/7 supv/assist to maximize pt safety and return to functional independence during meaningful occupations of daily life.    Recommendations for follow up therapy are one component of a multi-disciplinary discharge planning process, led by the attending physician.  Recommendations may be updated based on patient status, additional functional criteria and insurance authorization.    Follow Up Recommendations  Home health OT    Assistance Recommended at Discharge Frequent or constant Supervision/Assistance  Patient can return home with the following A little help with walking and/or transfers;A little help with bathing/dressing/bathroom;Assistance with cooking/housework;Assist for transportation;Help with stairs or ramp for entrance;Direct supervision/assist for medications management;Direct supervision/assist for financial management    Functional Status Assessment  Patient has had a recent decline in their functional status and demonstrates the ability to make significant improvements in function in a reasonable and predictable amount of time.  Equipment Recommendations  BSC/3in1;Other (comment) (2WW)    Recommendations for Other Services       Precautions / Restrictions Precautions Precautions: Fall Spinal Brace Comments: LSO doesn't work per pt/RN 2/2 bony protrusions in her back Restrictions Weight Bearing Restrictions: No      Mobility Bed Mobility Overal bed mobility: Needs Assistance Bed Mobility: Sit to Supine       Sit to supine: Min guard   General bed mobility comments: Pt seated EOB at start of session, CGA for BLE mgt back to bed with VC for sequencing using log roll technique to minimize back pain    Transfers Overall transfer level: Needs assistance Equipment used: Rolling walker (2 wheels) Transfers: Sit to/from Stand Sit to Stand: Min guard           General transfer comment: VC for hand placement      Balance Overall balance assessment: Needs assistance Sitting-balance support: Feet supported Sitting balance-Leahy Scale: Good     Standing balance support: Single extremity supported, No upper extremity supported, During functional activity Standing balance-Leahy Scale: Fair  ADL either performed or assessed with clinical judgement   ADL                                          General ADL Comments: Pt completed grooming tasks standing at the sink with CGA, requires MIN A for LB dressing/bathing using figure 4 or bringing foot up onto the EOB, and CGA for ADL mobility with RW     Vision         Perception     Praxis      Pertinent Vitals/Pain Pain Assessment Pain Assessment: 0-10 Pain Score: 10-Worst pain ever Pain Location: low/mid back, shooting down R hip Pain Descriptors / Indicators: Aching, Grimacing, Guarding, Moaning, Shooting, Sharp Pain Intervention(s): Limited activity within patient's tolerance, Monitored during session, Repositioned, Patient requesting pain meds-RN notified, RN gave pain meds during session     Hand Dominance Left   Extremity/Trunk Assessment Upper Extremity Assessment Upper Extremity Assessment: Generalized weakness (decr FMC L hand since CVA)   Lower Extremity Assessment Lower Extremity Assessment: Generalized weakness   Cervical / Trunk Assessment Cervical / Trunk Assessment: Kyphotic   Communication     Cognition Arousal/Alertness: Awake/alert Behavior During Therapy: WFL for tasks assessed/performed Overall Cognitive Status: No family/caregiver present to determine baseline cognitive functioning                                 General Comments: Pt endorses STM deficits at baseline     General Comments       Exercises Other Exercises Other Exercises: Pt educated in home/routines modifications, falls prevention, strategies to minimize back pain during ADL/mobility; handout provided   Shoulder Instructions      Home Living Family/patient expects to be discharged to:: Assisted living                             Home Equipment: Rollator (4 wheels);Toilet riser (tripod walker)          Prior Functioning/Environment Prior Level of Function : Needs assist             Mobility Comments: Pt reports prior to initial stroke she was independent without AD,  driving. transitioned to [facility], normally ambulatory without AD ADLs Comments: ALF assists with meals, meds, and cleaning. pt able to perform ADLs with limited assist from staff        OT Problem List: Decreased strength;Pain;Impaired balance (sitting and/or standing);Decreased knowledge of use of DME or AE      OT Treatment/Interventions: [place]-care/ADL training;Therapeutic exercise;Therapeutic activities;DME and/or AE instruction;Patient/family education;Balance training;Cognitive remediation/compensation    OT Goals(Current goals can be found in the care plan section) Acute Rehab OT Goals Patient Stated Goal: have less pain and be independent OT Goal Formulation: With patient Time For Goal Achievement: 02/02/22 Potential to Achieve Goals: Good ADL Goals Pt Will Perform Lower Body Dressing: with set-up;with caregiver independent in assisting;sit to/from stand;with adaptive equipment Pt Will Transfer to Toilet: ambulating;with supervision (LRAD) Pt Will Perform Toileting - Clothing Manipulation and hygiene: with modified independence Additional ADL Goal #1: Pt will complete bed mobility utilizing learned strategies to minimize back pain and with supervision.  OT Frequency: Min 2X/week    Co-evaluation              AM-PAC  OT "6 Clicks" Daily Activity     Outcome Measure Help from another person eating meals?: None Help from another person taking care of personal grooming?: A Little Help from another person toileting, which includes using toliet, bedpan, or urinal?: A Little Help from another person bathing (including washing, rinsing, drying)?: A Little Help from another person to put on and taking off regular upper body clothing?: A Little Help from another person to put on and taking off regular lower body clothing?: A Little 6 Click Score: 19   End of Session Equipment Utilized During Treatment: Rolling walker (2 wheels) Nurse Communication: Patient requests pain  meds  Activity Tolerance: Patient tolerated treatment well Patient left: in bed;with call bell/phone within reach;with bed alarm set;with nursing/sitter in room  OT Visit Diagnosis: Other abnormalities of gait and mobility (R26.89);Muscle weakness (generalized) (M62.81);Pain Pain - Right/Left: Right (and back) Pain - part of body: Hip                Time: 3903-0092 OT Time Calculation (min): 23 min Charges:  OT General Charges $OT Visit: 1 Visit OT Evaluation $OT Eval Moderate Complexity: 1 Mod OT Treatments $Range Care/Home Management : 8-22 mins  Arman Filter., MPH, MS, OTR/L ascom [phone] 01/19/22, 9:18 AM

## 2022-01-19 NOTE — Progress Notes (Signed)
Not able to reach the [facility] nurses station from the number listed at [phone].Patient have been transported to facility with discharge notes for nurse.

## 2022-01-19 NOTE — TOC Progression Note (Signed)
Transition of Care [facility]) - Progression Note    Patient Details  Name: Kaylee Santiago MRN: [id number] Date of Birth: 1935-05-25  Transition of Care [facility]) CM/SW Contact  Caryn Section, RN Phone Number: 01/19/2022, 10:49 AM  Clinical Narrative:   Patient discharged today.  Chip Boer does not have transportation today as per [name].  RNCM spoke with  [name] (Niece)  [phone] ([place]), who states she just had surgery, but is calling other family members to transport this afternoon to [place].     Expected Discharge Plan: Assisted Living Barriers to Discharge: Continued Medical Work up  Expected Discharge Plan and Services Expected Discharge Plan: Assisted Living   Discharge Planning Services: CM Consult   Living arrangements for the past 2 months: Assisted Living Facility Expected Discharge Date: 01/19/22                                     Social Determinants of Health (SDOH) Interventions    Readmission Risk Interventions    11/09/2021   10:12 AM 10/10/2021   10:44 AM  Readmission Risk Prevention Plan  Transportation Screening Complete Complete  PCP or Specialist Appt within 5-7 Days Complete   PCP or Specialist Appt within 3-5 Days  Complete  Medication Review (RN CM) Complete   HRI or Home Care Consult  Complete  Social Work Consult for Recovery Care Planning/Counseling  Complete  Palliative Care Screening  Not Applicable  Medication Review [employer])  Complete

## 2022-01-19 NOTE — Plan of Care (Signed)

## 2022-02-02 ENCOUNTER — Other Ambulatory Visit: Payer: Self-pay

## 2022-02-02 DIAGNOSIS — S32030D Wedge compression fracture of third lumbar vertebra, subsequent encounter for fracture with routine healing: Secondary | ICD-10-CM

## 2022-02-04 ENCOUNTER — Ambulatory Visit (INDEPENDENT_AMBULATORY_CARE_PROVIDER_SITE_OTHER): Payer: Medicare Other | Admitting: Neurosurgery

## 2022-02-04 ENCOUNTER — Encounter: Payer: Self-pay | Admitting: Neurosurgery

## 2022-02-04 ENCOUNTER — Ambulatory Visit
Admission: RE | Admit: 2022-02-04 | Discharge: 2022-02-04 | Disposition: A | Discharge status: Home / Self Care | Payer: Medicare Other | Source: Ambulatory Visit | Attending: Neurosurgery | Admitting: Neurosurgery

## 2022-02-04 ENCOUNTER — Ambulatory Visit
Admission: RE | Admit: 2022-02-04 | Discharge: 2022-02-04 | Disposition: A | Discharge status: Home / Self Care | Payer: Medicare Other | Attending: Neurosurgery | Admitting: Neurosurgery

## 2022-02-04 VITALS — BP 124/74 | Ht 70.0 in | Wt 116.0 lb

## 2022-02-04 DIAGNOSIS — S32030D Wedge compression fracture of third lumbar vertebra, subsequent encounter for fracture with routine healing: Secondary | ICD-10-CM | POA: Diagnosis present

## 2022-02-04 DIAGNOSIS — S32030G Wedge compression fracture of third lumbar vertebra, subsequent encounter for fracture with delayed healing: Secondary | ICD-10-CM

## 2022-02-04 NOTE — Progress Notes (Signed)
Order and office note has been faxed to [facility].

## 2022-02-04 NOTE — Progress Notes (Signed)
Follow-up note: Referring Physician:  Lynnea Ferrier, MD [address] [facility], [place],  [place] [address]  Primary Physician:  Lynnea Ferrier, MD  Chief Complaint:  hospital follow up for lumbar compression fracture  History of Present Illness: Kaylee Santiago is a 87 y.o. female who presents today for follow up of lumbar fractures.  She continues to have significant back pain.  She was discharged from the hospital without a brace and it was noted by the nurse and orthotics that the patient refused the brace.  She states that it did cause some discomfort in the middle of her back due to a "hump" in her back that has been there for many years but she does not recall overtly refusing the brace.  She also endorses some pain down the lateral aspect of her right thigh which improved some with heat.  She is currently residing at [facility] and is receiving Tylenol, tizanidine, and oxycodone as needed for pain.  Overall she states that she is unchanged since her hospitalization.  Review of Systems:  A 10 point review of systems is negative, and the pertinent positives and negatives detailed in the HPI.  Past Medical History: Past Medical History:  Diagnosis Date   Atrial fibrillation (HCC)    Atrial fibrillation (HCC)    Back pain    Dysrhythmia    Hypertension    Hypothyroidism    Stroke [facility])     Past Surgical History: Past Surgical History:  Procedure Laterality Date   ABDOMINAL HYSTERECTOMY     BREAST EXCISIONAL BIOPSY Right 70's   CATARACT EXTRACTION W/ INTRAOCULAR LENS  IMPLANT, BILATERAL     KYPHOPLASTY N/A 12/21/2018   Procedure: KYPHOPLASTY L1;  Surgeon: Kennedy Bucker, MD;  Location: [facility] ORS;  Service: Orthopedics;  Laterality: N/A;    Allergies: Allergies as of 02/04/2022 - Review Complete 02/04/2022  Allergen Reaction Noted   Amoxicillin-pot clavulanate Diarrhea 12/03/2013   Levofloxacin Diarrhea 12/03/2013   Buspirone Other (See Comments)  01/16/2022   Trazodone Other (See Comments) 05/29/2019    Medications: Outpatient Encounter Medications as of 02/04/2022  Medication Sig   acetaminophen (TYLENOL) 325 MG tablet Take 2 tablets (650 mg total) by mouth every 6 (six) hours as needed for mild pain (or Fever >/= 101).   atorvastatin (LIPITOR) 40 MG tablet Place 40 mg into feeding tube daily.   Lactobacillus Rhamnosus, GG, (CULTURELLE) CAPS Take 1 capsule by mouth daily.   levothyroxine (SYNTHROID) 75 MCG tablet Take 1 tablet by mouth daily.   lisinopril (ZESTRIL) 30 MG tablet Take 30 mg by mouth daily.   LORazepam (ATIVAN) 0.5 MG tablet Take 0.5 mg by mouth at bedtime as needed for sleep.   melatonin 3 MG TABS tablet Place 3 mg into feeding tube at bedtime.   metoprolol tartrate (LOPRESSOR) 25 MG tablet Place 1 tablet (25 mg total) into feeding tube 2 (two) times daily.   polyethylene glycol powder (GLYCOLAX/MIRALAX) 17 GM/SCOOP powder Place 17 g into feeding tube.   senna (SENOKOT) 8.6 MG tablet Place 8.6 mg into feeding tube at bedtime.   sodium chloride 1 g tablet Take 1 tablet (1 g total) by mouth 2 (two) times daily with a meal.   tiZANidine (ZANAFLEX) 2 MG tablet Take 2 mg by mouth every 8 (eight) hours as needed.   XARELTO 20 MG TABS tablet Take 20 mg by mouth daily.   hydrocortisone 2.5 % cream Apply 1 application. topically 2 (two) times daily.  Apply to upper back. (Patient not taking: Reported on 01/16/2022)   No facility-administered encounter medications on file as of 02/04/2022.    Social History: Social History   Tobacco Use   Smoking status: Former    Types: Cigarettes   Smokeless tobacco: Never  Vaping Use   Vaping Use: Never used  Substance Use Topics   Alcohol use: Not Currently   Drug use: Never    Family Medical History: Family History  Problem Relation Age of Onset   Breast cancer Sister 58   Breast cancer Sister        52's   CAD Mother     Exam:  Today's Vitals   02/04/22 1145  BP:  124/74  Weight: 52.6 kg  Height: 5\' 10"  (1.778 m)  PainSc: 7   PainLoc: Back   Body mass index is 16.64 kg/m.   General: A&O.  Patient underweight and cachectic.  ROM of spine: limited due to pain.  Palpation of spine: TTP throughout.  Strength in the left lower extremity is at least 3/5 throughout. Pt limited due to pain.  Reflexes are 1+ and symmetric at the patella and achilles.   Bilateral lower extremity sensation is intact to light touch.   Imaging:  02/04/22 lumbar xrays Interval worsening of L3 compression fracture with 90% height loss and mild segmental kyphosis.  01/18/22 lumbar xrays IMPRESSION: Progressive height loss of the L3 compression fracture, now up to 80%.   Unchanged chronic L1, L2, and L5 compression fractures.     Electronically Signed   By: 03/21/22 M.D.   On: 01/18/2022 14:23  01/12/22 MRI L spine IMPRESSION: 1. Subacute appearing L3 compression fracture with 30-35% loss of vertebral body height. No significant retropulsion, but new bilateral L3 neural foraminal stenosis.   2. Stable chronic L1, L2, and L5 compression fractures. Chronic L2 retropulsion and spinal and foraminal stenosis at both L1-L2 and L2-L3.     Electronically Signed   By: 03/15/22 M.D.   On: 01/12/2022 08:44  I have personally reviewed the images and agree with the above interpretation.  Assessment and Plan: Ms. Delcastillo is a pleasant 87 y.o. female with persistent back pain and worsening of L3 compression fracture.  Unfortunately due to the severity of his fracture do not think she is likely a candidate for kyphoplasty.  I did discuss the importance of a brace and have placed an order for this.  She can have a TLSO versus LSO whichever is more comfortable.  I will also place a referral for pain management as anticipate she will have long term pain from the severity of her fractures and kyphosis.  We will see her back in 8 to 10 weeks with repeat x-rays.  She was encouraged to call  the office in the interim with any questions or concerns.  She expressed understanding was in agreement with this plan.  I spent a total of 34 minutes in both face-to-face and non-face-to-face activities for this visit on the date of this encounter including review of records, review of imaging, discussion of symptoms, discussion of treatment options, referral placement, and documentation.   97 PA-C  Neurosurgery

## 2022-02-16 ENCOUNTER — Inpatient Hospital Stay
Admission: EM | Admit: 2022-02-16 | Discharge: 2022-03-12 | DRG: 871 | Disposition: E | Payer: Medicare Other | Source: Skilled Nursing Facility | Attending: Internal Medicine | Admitting: Internal Medicine

## 2022-02-16 ENCOUNTER — Inpatient Hospital Stay: Payer: Medicare Other

## 2022-02-16 ENCOUNTER — Emergency Department: Payer: Medicare Other

## 2022-02-16 ENCOUNTER — Other Ambulatory Visit: Payer: Self-pay

## 2022-02-16 ENCOUNTER — Inpatient Hospital Stay
Admit: 2022-02-16 | Discharge: 2022-02-16 | Disposition: A | Discharge status: Home / Self Care | Payer: Medicare Other | Attending: Internal Medicine | Admitting: Internal Medicine

## 2022-02-16 DIAGNOSIS — E43 Unspecified severe protein-calorie malnutrition: Secondary | ICD-10-CM | POA: Diagnosis present

## 2022-02-16 DIAGNOSIS — G9349 Other encephalopathy: Secondary | ICD-10-CM | POA: Diagnosis present

## 2022-02-16 DIAGNOSIS — S32030D Wedge compression fracture of third lumbar vertebra, subsequent encounter for fracture with routine healing: Secondary | ICD-10-CM | POA: Diagnosis not present

## 2022-02-16 DIAGNOSIS — R29702 NIHSS score 2: Secondary | ICD-10-CM | POA: Diagnosis present

## 2022-02-16 DIAGNOSIS — Z515 Encounter for palliative care: Secondary | ICD-10-CM | POA: Diagnosis not present

## 2022-02-16 DIAGNOSIS — L89152 Pressure ulcer of sacral region, stage 2: Secondary | ICD-10-CM | POA: Diagnosis present

## 2022-02-16 DIAGNOSIS — E872 Acidosis, unspecified: Secondary | ICD-10-CM | POA: Diagnosis present

## 2022-02-16 DIAGNOSIS — G9340 Encephalopathy, unspecified: Secondary | ICD-10-CM | POA: Diagnosis present

## 2022-02-16 DIAGNOSIS — Z20822 Contact with and (suspected) exposure to covid-19: Secondary | ICD-10-CM | POA: Diagnosis present

## 2022-02-16 DIAGNOSIS — R29810 Facial weakness: Secondary | ICD-10-CM | POA: Diagnosis present

## 2022-02-16 DIAGNOSIS — R778 Other specified abnormalities of plasma proteins: Secondary | ICD-10-CM

## 2022-02-16 DIAGNOSIS — A419 Sepsis, unspecified organism: Secondary | ICD-10-CM

## 2022-02-16 DIAGNOSIS — I639 Cerebral infarction, unspecified: Secondary | ICD-10-CM | POA: Diagnosis present

## 2022-02-16 DIAGNOSIS — R7881 Bacteremia: Secondary | ICD-10-CM | POA: Diagnosis present

## 2022-02-16 DIAGNOSIS — Z7901 Long term (current) use of anticoagulants: Secondary | ICD-10-CM

## 2022-02-16 DIAGNOSIS — J479 Bronchiectasis, uncomplicated: Secondary | ICD-10-CM | POA: Diagnosis present

## 2022-02-16 DIAGNOSIS — Z7189 Other specified counseling: Secondary | ICD-10-CM | POA: Diagnosis not present

## 2022-02-16 DIAGNOSIS — Z8673 Personal history of transient ischemic attack (TIA), and cerebral infarction without residual deficits: Secondary | ICD-10-CM

## 2022-02-16 DIAGNOSIS — Z8249 Family history of ischemic heart disease and other diseases of the circulatory system: Secondary | ICD-10-CM

## 2022-02-16 DIAGNOSIS — I6340 Cerebral infarction due to embolism of unspecified cerebral artery: Secondary | ICD-10-CM | POA: Diagnosis present

## 2022-02-16 DIAGNOSIS — E871 Hypo-osmolality and hyponatremia: Secondary | ICD-10-CM | POA: Diagnosis present

## 2022-02-16 DIAGNOSIS — F0390 Unspecified dementia without behavioral disturbance: Secondary | ICD-10-CM | POA: Diagnosis present

## 2022-02-16 DIAGNOSIS — R1310 Dysphagia, unspecified: Secondary | ICD-10-CM | POA: Diagnosis present

## 2022-02-16 DIAGNOSIS — Z881 Allergy status to other antibiotic agents status: Secondary | ICD-10-CM

## 2022-02-16 DIAGNOSIS — B9562 Methicillin resistant Staphylococcus aureus infection as the cause of diseases classified elsewhere: Secondary | ICD-10-CM | POA: Diagnosis present

## 2022-02-16 DIAGNOSIS — Z79899 Other long term (current) drug therapy: Secondary | ICD-10-CM

## 2022-02-16 DIAGNOSIS — E785 Hyperlipidemia, unspecified: Secondary | ICD-10-CM | POA: Diagnosis present

## 2022-02-16 DIAGNOSIS — R471 Dysarthria and anarthria: Secondary | ICD-10-CM | POA: Diagnosis present

## 2022-02-16 DIAGNOSIS — M4856XA Collapsed vertebra, not elsewhere classified, lumbar region, initial encounter for fracture: Secondary | ICD-10-CM | POA: Diagnosis present

## 2022-02-16 DIAGNOSIS — Z681 Body mass index (BMI) 19 or less, adult: Secondary | ICD-10-CM | POA: Diagnosis not present

## 2022-02-16 DIAGNOSIS — Z66 Do not resuscitate: Secondary | ICD-10-CM | POA: Diagnosis present

## 2022-02-16 DIAGNOSIS — A4102 Sepsis due to Methicillin resistant Staphylococcus aureus: Secondary | ICD-10-CM | POA: Diagnosis present

## 2022-02-16 DIAGNOSIS — G8929 Other chronic pain: Secondary | ICD-10-CM | POA: Diagnosis present

## 2022-02-16 DIAGNOSIS — E861 Hypovolemia: Secondary | ICD-10-CM | POA: Diagnosis present

## 2022-02-16 DIAGNOSIS — I69391 Dysphagia following cerebral infarction: Secondary | ICD-10-CM | POA: Diagnosis not present

## 2022-02-16 DIAGNOSIS — R6520 Severe sepsis without septic shock: Secondary | ICD-10-CM | POA: Diagnosis present

## 2022-02-16 DIAGNOSIS — I248 Other forms of acute ischemic heart disease: Secondary | ICD-10-CM | POA: Diagnosis present

## 2022-02-16 DIAGNOSIS — R54 Age-related physical debility: Secondary | ICD-10-CM | POA: Diagnosis present

## 2022-02-16 DIAGNOSIS — J8410 Pulmonary fibrosis, unspecified: Secondary | ICD-10-CM | POA: Diagnosis present

## 2022-02-16 DIAGNOSIS — I10 Essential (primary) hypertension: Secondary | ICD-10-CM | POA: Diagnosis present

## 2022-02-16 DIAGNOSIS — M4854XA Collapsed vertebra, not elsewhere classified, thoracic region, initial encounter for fracture: Secondary | ICD-10-CM | POA: Diagnosis present

## 2022-02-16 DIAGNOSIS — M6282 Rhabdomyolysis: Secondary | ICD-10-CM | POA: Diagnosis present

## 2022-02-16 DIAGNOSIS — R7401 Elevation of levels of liver transaminase levels: Secondary | ICD-10-CM | POA: Diagnosis present

## 2022-02-16 DIAGNOSIS — R4182 Altered mental status, unspecified: Secondary | ICD-10-CM | POA: Diagnosis not present

## 2022-02-16 DIAGNOSIS — R7989 Other specified abnormal findings of blood chemistry: Secondary | ICD-10-CM | POA: Diagnosis present

## 2022-02-16 DIAGNOSIS — L8990 Pressure ulcer of unspecified site, unspecified stage: Secondary | ICD-10-CM | POA: Diagnosis not present

## 2022-02-16 DIAGNOSIS — T447X5A Adverse effect of beta-adrenoreceptor antagonists, initial encounter: Secondary | ICD-10-CM | POA: Diagnosis present

## 2022-02-16 DIAGNOSIS — I4819 Other persistent atrial fibrillation: Secondary | ICD-10-CM | POA: Diagnosis present

## 2022-02-16 DIAGNOSIS — R6510 Systemic inflammatory response syndrome (SIRS) of non-infectious origin without acute organ dysfunction: Secondary | ICD-10-CM | POA: Diagnosis present

## 2022-02-16 DIAGNOSIS — Z880 Allergy status to penicillin: Secondary | ICD-10-CM

## 2022-02-16 DIAGNOSIS — Z87891 Personal history of nicotine dependence: Secondary | ICD-10-CM

## 2022-02-16 DIAGNOSIS — I619 Nontraumatic intracerebral hemorrhage, unspecified: Secondary | ICD-10-CM | POA: Diagnosis present

## 2022-02-16 DIAGNOSIS — M549 Dorsalgia, unspecified: Secondary | ICD-10-CM | POA: Diagnosis present

## 2022-02-16 DIAGNOSIS — R748 Abnormal levels of other serum enzymes: Secondary | ICD-10-CM | POA: Diagnosis present

## 2022-02-16 DIAGNOSIS — E039 Hypothyroidism, unspecified: Secondary | ICD-10-CM | POA: Diagnosis present

## 2022-02-16 DIAGNOSIS — S22080A Wedge compression fracture of T11-T12 vertebra, initial encounter for closed fracture: Secondary | ICD-10-CM | POA: Diagnosis present

## 2022-02-16 DIAGNOSIS — Z79890 Hormone replacement therapy (postmenopausal): Secondary | ICD-10-CM

## 2022-02-16 DIAGNOSIS — Z888 Allergy status to other drugs, medicaments and biological substances status: Secondary | ICD-10-CM

## 2022-02-16 LAB — URINALYSIS, COMPLETE (UACMP) WITH MICROSCOPIC
Bacteria, UA: NONE SEEN
Bilirubin Urine: NEGATIVE
Glucose, UA: NEGATIVE mg/dL
Ketones, ur: 20 mg/dL — AB
Leukocytes,Ua: NEGATIVE
Nitrite: NEGATIVE
Protein, ur: 100 mg/dL — AB
Specific Gravity, Urine: 1.018 (ref 1.005–1.030)
pH: 5 (ref 5.0–8.0)

## 2022-02-16 LAB — BLOOD CULTURE ID PANEL (REFLEXED) - BCID2

## 2022-02-16 LAB — RESP PANEL BY RT-PCR (FLU A&B, COVID) ARPGX2
Influenza A by PCR: NEGATIVE
Influenza B by PCR: NEGATIVE
SARS Coronavirus 2 by RT PCR: NEGATIVE

## 2022-02-16 LAB — CK: Total CK: 481 U/L — ABNORMAL HIGH (ref 38–234)

## 2022-02-16 LAB — LIPID PANEL
Cholesterol: 101 mg/dL (ref 0–200)
HDL: 48 mg/dL (ref >40)
LDL Cholesterol: 39 mg/dL (ref 0–99)
Total CHOL/HDL Ratio: 2.1 RATIO
Triglycerides: 70 mg/dL (ref <150)
VLDL: 14 mg/dL (ref 0–40)

## 2022-02-16 LAB — COMPREHENSIVE METABOLIC PANEL
ALT: 46 U/L — ABNORMAL HIGH (ref 0–44)
AST: 77 U/L — ABNORMAL HIGH (ref 15–41)
Albumin: 3 g/dL — ABNORMAL LOW (ref 3.5–5.0)
Alkaline Phosphatase: 171 U/L — ABNORMAL HIGH (ref 38–126)
Anion gap: 11 (ref 5–15)
BUN: 25 mg/dL — ABNORMAL HIGH (ref 8–23)
CO2: 27 mmol/L (ref 22–32)
Calcium: 8.8 mg/dL — ABNORMAL LOW (ref 8.9–10.3)
Chloride: 96 mmol/L — ABNORMAL LOW (ref 98–111)
Creatinine, Ser: 0.86 mg/dL (ref 0.44–1.00)
GFR, Estimated: >60 mL/min (ref >60)
Glucose, Bld: 156 mg/dL — ABNORMAL HIGH (ref 70–99)
Potassium: 4.6 mmol/L (ref 3.5–5.1)
Sodium: 134 mmol/L — ABNORMAL LOW (ref 135–145)
Total Bilirubin: 0.9 mg/dL (ref 0.3–1.2)
Total Protein: 6.5 g/dL (ref 6.5–8.1)

## 2022-02-16 LAB — PROCALCITONIN: Procalcitonin: 16.81 ng/mL

## 2022-02-16 LAB — CBC
HCT: 35.7 % — ABNORMAL LOW (ref 36.0–46.0)
Hemoglobin: 11.8 g/dL — ABNORMAL LOW (ref 12.0–15.0)
MCH: 30.1 pg (ref 26.0–34.0)
MCHC: 33.1 g/dL (ref 30.0–36.0)
MCV: 91.1 fL (ref 80.0–100.0)
Platelets: 218 10*3/uL (ref 150–400)
RBC: 3.92 MIL/uL (ref 3.87–5.11)
RDW: 13.8 % (ref 11.5–15.5)
WBC: 22.7 10*3/uL — ABNORMAL HIGH (ref 4.0–10.5)
nRBC: 0 % (ref 0.0–0.2)

## 2022-02-16 LAB — LACTIC ACID, PLASMA
Lactic Acid, Venous: 2.4 mmol/L (ref 0.5–1.9)
Lactic Acid, Venous: 1.9 mmol/L (ref 0.5–1.9)

## 2022-02-16 LAB — TROPONIN I (HIGH SENSITIVITY)
Troponin I (High Sensitivity): 342 ng/L (ref <18)
Troponin I (High Sensitivity): 348 ng/L (ref <18)

## 2022-02-16 LAB — MAGNESIUM: Magnesium: 2.1 mg/dL (ref 1.7–2.4)

## 2022-02-16 LAB — HEMOGLOBIN A1C
Hgb A1c MFr Bld: 6.2 % — ABNORMAL HIGH (ref 4.8–5.6)
Mean Plasma Glucose: 131.24 mg/dL

## 2022-02-16 LAB — TSH: TSH: 1.133 u[IU]/mL (ref 0.350–4.500)

## 2022-02-16 MED ORDER — LEVOTHYROXINE SODIUM 50 MCG PO TABS
75.0000 ug | ORAL_TABLET | Freq: Every day | ORAL | Status: DC
  Administered 2022-02-17 – 2022-02-18 (×2): 75 ug via ORAL
  Filled 2022-02-16 (×2): qty 1

## 2022-02-16 MED ORDER — LORAZEPAM 0.5 MG PO TABS
0.5000 mg | ORAL_TABLET | Freq: Every evening | ORAL | Status: DC | PRN
  Administered 2022-02-16: 0.5 mg via ORAL
  Filled 2022-02-16: qty 1

## 2022-02-16 MED ORDER — ALBUTEROL SULFATE (2.5 MG/3ML) 0.083% IN NEBU: 2.5000 mg | INHALATION_SOLUTION | Freq: Four times a day (QID) | RESPIRATORY_TRACT | Status: DC | PRN

## 2022-02-16 MED ORDER — SODIUM CHLORIDE 0.9 % IV BOLUS (SEPSIS)
1000.0000 mL | Freq: Once | INTRAVENOUS | Status: AC
  Administered 2022-02-16: 1000 mL via INTRAVENOUS

## 2022-02-16 MED ORDER — SODIUM CHLORIDE 0.9% FLUSH
3.0000 mL | Freq: Two times a day (BID) | INTRAVENOUS | Status: DC
Start: 2022-02-16 — End: 2022-02-19
  Administered 2022-02-17 – 2022-02-18 (×2): 3 mL via INTRAVENOUS

## 2022-02-16 MED ORDER — VANCOMYCIN HCL 750 MG/150ML IV SOLN
750.0000 mg | INTRAVENOUS | Status: DC
  Administered 2022-02-17: 750 mg via INTRAVENOUS
  Filled 2022-02-16 (×2): qty 150

## 2022-02-16 MED ORDER — OXYCODONE HCL 5 MG PO TABS
5.0000 mg | ORAL_TABLET | Freq: Four times a day (QID) | ORAL | Status: DC | PRN
  Administered 2022-02-17: 5 mg via ORAL
  Filled 2022-02-16 (×2): qty 1

## 2022-02-16 MED ORDER — METOPROLOL TARTRATE 5 MG/5ML IV SOLN
5.0000 mg | Freq: Once | INTRAVENOUS | Status: AC
  Administered 2022-02-17: 5 mg via INTRAVENOUS
  Filled 2022-02-16: qty 5

## 2022-02-16 MED ORDER — ACETAMINOPHEN 650 MG RE SUPP: 650.0000 mg | Freq: Four times a day (QID) | RECTAL | Status: DC | PRN

## 2022-02-16 MED ORDER — STROKE: EARLY STAGES OF RECOVERY BOOK: Freq: Once | Status: AC

## 2022-02-16 MED ORDER — METRONIDAZOLE 500 MG/100ML IV SOLN
500.0000 mg | Freq: Once | INTRAVENOUS | Status: AC
  Administered 2022-02-16: 500 mg via INTRAVENOUS
  Filled 2022-02-16: qty 100

## 2022-02-16 MED ORDER — SACCHAROMYCES BOULARDII 250 MG PO CAPS
250.0000 mg | ORAL_CAPSULE | Freq: Every day | ORAL | Status: DC
  Administered 2022-02-18: 250 mg via ORAL
  Filled 2022-02-16 (×2): qty 1

## 2022-02-16 MED ORDER — PSYLLIUM 95 % PO PACK
1.0000 | PACK | Freq: Two times a day (BID) | ORAL | Status: DC
  Filled 2022-02-16 (×5): qty 1

## 2022-02-16 MED ORDER — FENTANYL CITRATE PF 50 MCG/ML IJ SOSY
25.0000 ug | PREFILLED_SYRINGE | INTRAMUSCULAR | Status: DC | PRN
  Administered 2022-02-16 – 2022-02-18 (×7): 25 ug via INTRAVENOUS
  Filled 2022-02-16 (×9): qty 1

## 2022-02-16 MED ORDER — VANCOMYCIN HCL IN DEXTROSE 1-5 GM/200ML-% IV SOLN
1000.0000 mg | Freq: Once | INTRAVENOUS | Status: AC
  Administered 2022-02-16: 1000 mg via INTRAVENOUS
  Filled 2022-02-16: qty 200

## 2022-02-16 MED ORDER — HALOPERIDOL LACTATE 5 MG/ML IJ SOLN
2.0000 mg | Freq: Once | INTRAMUSCULAR | Status: AC
  Administered 2022-02-16: 2 mg via INTRAVENOUS
  Filled 2022-02-16: qty 1

## 2022-02-16 MED ORDER — SODIUM CHLORIDE 0.9 % IV SOLN: INTRAVENOUS | Status: DC

## 2022-02-16 MED ORDER — LACTATED RINGERS IV SOLN: INTRAVENOUS | Status: DC

## 2022-02-16 MED ORDER — METRONIDAZOLE 500 MG/100ML IV SOLN
500.0000 mg | Freq: Two times a day (BID) | INTRAVENOUS | Status: DC
  Filled 2022-02-16: qty 100

## 2022-02-16 MED ORDER — SODIUM CHLORIDE 0.9 % IV SOLN
2.0000 g | Freq: Three times a day (TID) | INTRAVENOUS | Status: DC
  Administered 2022-02-16: 2 g via INTRAVENOUS
  Filled 2022-02-16: qty 2
  Filled 2022-02-16: qty 10

## 2022-02-16 MED ORDER — ACETAMINOPHEN 325 MG PO TABS: 650.0000 mg | ORAL_TABLET | Freq: Four times a day (QID) | ORAL | Status: DC | PRN

## 2022-02-16 MED ORDER — OXYCODONE HCL 5 MG PO TABS
5.0000 mg | ORAL_TABLET | Freq: Four times a day (QID) | ORAL | Status: DC | PRN
  Administered 2022-02-16: 5 mg via ORAL
  Filled 2022-02-16: qty 1

## 2022-02-16 MED ORDER — SODIUM CHLORIDE 0.9 % IV SOLN
2.0000 g | Freq: Once | INTRAVENOUS | Status: AC
  Administered 2022-02-16: 2 g via INTRAVENOUS
  Filled 2022-02-16: qty 10

## 2022-02-16 MED ORDER — BISMUTH SUBSALICYLATE 262 MG/15ML PO SUSP: 15.0000 mL | Freq: Four times a day (QID) | ORAL | Status: DC | PRN

## 2022-02-16 MED ORDER — ATORVASTATIN CALCIUM 20 MG PO TABS
40.0000 mg | ORAL_TABLET | Freq: Every day | ORAL | Status: DC
  Administered 2022-02-16 – 2022-02-18 (×2): 40 mg
  Filled 2022-02-16 (×2): qty 2

## 2022-02-16 MED ORDER — ACETAMINOPHEN 500 MG PO TABS
1000.0000 mg | ORAL_TABLET | Freq: Once | ORAL | Status: AC
  Administered 2022-02-16: 1000 mg via ORAL
  Filled 2022-02-16: qty 2

## 2022-02-16 MED ORDER — ASPIRIN 81 MG PO TBEC
81.0000 mg | DELAYED_RELEASE_TABLET | Freq: Every day | ORAL | Status: DC
  Administered 2022-02-16 – 2022-02-18 (×2): 81 mg via ORAL
  Filled 2022-02-16 (×2): qty 1

## 2022-02-16 NOTE — Sepsis Progress Note (Addendum)
Elink following code sepsis  With first review of chart BC shown drawn at 1232 x2, after reporting processing timed changed to 1245 and 1247

## 2022-02-16 NOTE — Progress Notes (Signed)
       CROSS COVER NOTE  NAME: HOLLYE PRITT MRN: [id number] DOB : 1935/04/11    Date of Service   02/21/2022  HPI/Events of Note   Notified by nursing of increased HR 159, with AFIB on tele. M(r)s Watt has a known history of AFIB, PO metoprolol held for permissive HTN post CVA.  Interventions   Plan: Metoprolol 5 mg IV      This document was prepared using Dragon voice recognition software and may include unintentional dictation errors.  Bishop Limbo DNP, MHA, FNP-BC Nurse Practitioner [facility] Pager [phone]

## 2022-02-16 NOTE — ED Provider Notes (Signed)
[facility] Provider Note    Event Date/Time   First MD Initiated Contact with Patient 02/14/2022 1138     (approximate)  History   Chief Complaint: Altered Mental Status (Chronic back pain , pain 10/10, no fall, per ems more altered than normal )  HPI  Kaylee Santiago is a 87 y.o. female with a past medical history of atrial fibrillation, hypertension, CVA, presents to the emergency department from her nursing facility for altered mental status.  Per EMS patient is more confused than typical.  They state facility reported chronic back pain but patient has been complaining more about her back recently.  No trauma or falls per report.  Here the patient is somnolent but awakens easily to voice.  Answers questions mostly appropriately.  Patient is complaining of pain in her lower back.  Physical Exam   Triage Vital Signs: ED Triage Vitals [02/24/2022 1130]  Enc Vitals Group     BP 118/79     Pulse Rate 88     Resp 17     Temp 98.5 F (36.9 C)     Temp Source Oral     SpO2 98 %     Weight      Height      Head Circumference      Peak Flow      Pain Score 8     Pain Loc      Pain Edu?      Excl. in GC?     Most recent vital signs: Vitals:   02/24/2022 1130  BP: 118/79  Pulse: 88  Resp: 17  Temp: 98.5 F (36.9 C)  SpO2: 98%    General: Awakens easily to voice.  No distress. CV:  Good peripheral perfusion.  Regular rate and rhythm  Resp:  Normal effort.  Equal breath sounds bilaterally.  Abd:  No distention.  Soft, nontender.  No rebound or guarding.   ED Results / Procedures / Treatments   EKG  EKG viewed and interpreted by myself appear to show atrial fibrillation 110 bpm with a narrow QRS, normal axis, reassuring intervals, no concerning ST changes.  RADIOLOGY  I have reviewed and interpreted the CT images I do not see any large bleed on my evaluation. Radiology sees 2 areas of hypodensity concerning for possible CVA.  They see 2 small areas  of calcification that she states could be bleed although will look more consistent with calcification recommend MRI to follow-up.   MEDICATIONS ORDERED IN ED: Medications - No data to display   IMPRESSION / MDM / ASSESSMENT AND PLAN / ED COURSE  I reviewed the triage vital signs and the nursing notes.  Patient's presentation is most consistent with acute presentation with potential threat to life or bodily function.  Patient presents to the emergency department for worsening confusion and complaining of pain in her lower back.  Overall the patient appears well she is somnolent but awakens to voice she answers most questions with questionable accuracy however.  Patient is complaining of lower back pain.  Vital signs are reassuring in the emergency department.  We will check labs, urinalysis we will obtain CT imaging of the abdomen/pelvis as well as lower back and continue to closely monitor.  According to EMS they state the patient's initial temperature was 100.4 with them although afebrile in the emergency department.  Patient's lab work has resulted showing leukocytosis of 22,000 troponin elevated 342, mild LFT elevation.  CT the abdomen/pelvis is  still pending.  CT head and L-spine are pending.  Given the patient's elevated white blood cell count and fever by EMS to 100.4 we will check blood cultures lactic acid and start the patient on broad-spectrum antibiotics.  Troponin elevated 342 we will start the patient on heparin while awaiting further results.  Radiology is called me regarding the patient's CT scan, there is seeing 2 new areas of hypoattenuation.  I spoke to the patient's niece who is listed as her contact.  She states the patient had 2 strokes in [place] since April.  This could very likely be the areas that we are seeing on CT imaging however given the patient's elevated troponin and CT findings I believe an MRI would be warranted.  Niece is agreeable to this plan.  Given the  patient's elevated white blood cell count she will still be admitted to the hospital for further work-up although urinalysis has resulted normal.  Chest x-ray appears overall clear.   CRITICAL CARE Performed by: Minna Antis   Total critical care time: 30 minutes  Critical care time was exclusive of separately billable procedures and treating other patients.  Critical care was necessary to treat or prevent imminent or life-threatening deterioration.  Critical care was time spent personally by me on the following activities: development of treatment plan with patient and/or surrogate as well as nursing, discussions with consultants, evaluation of patient's response to treatment, examination of patient, obtaining history from patient or surrogate, ordering and performing treatments and interventions, ordering and review of laboratory studies, ordering and review of radiographic studies, pulse oximetry and re-evaluation of patient's condition.   FINAL CLINICAL IMPRESSION(S) / ED DIAGNOSES   Sepsis NSTEMI    Note:  This document was prepared using Dragon voice recognition software and may include unintentional dictation errors.   Minna Antis, MD 03/08/2022 1436

## 2022-02-16 NOTE — Progress Notes (Signed)
PHARMACY - PHYSICIAN COMMUNICATION CRITICAL VALUE ALERT - BLOOD CULTURE IDENTIFICATION (BCID)  Results for orders placed or performed during the hospital encounter of 2022-03-17  Resp Panel by RT-PCR (Flu A&B, Covid) Anterior Nasal Swab     Status: None   Collection Time: March 17, 2022 11:36 AM   Specimen: Anterior Nasal Swab  Result Value Ref Range Status   SARS Coronavirus 2 by RT PCR NEGATIVE NEGATIVE Final    Comment: (NOTE) SARS-CoV-2 target nucleic acids are NOT DETECTED.  The SARS-CoV-2 RNA is generally detectable in upper respiratory specimens during the acute phase of infection. The lowest concentration of SARS-CoV-2 viral copies this assay can detect is 138 copies/mL. A negative result does not preclude SARS-Cov-2 infection and should not be used as the sole basis for treatment or other patient management decisions. A negative result may occur with  improper specimen collection/handling, submission of specimen other than nasopharyngeal swab, presence of viral mutation(s) within the areas targeted by this assay, and inadequate number of viral copies(<138 copies/mL). A negative result must be combined with clinical observations, patient history, and epidemiological information. The expected result is Negative.  Fact Sheet for Patients:  BloggerCourse.com  Fact Sheet for Healthcare Providers:  SeriousBroker.it  This test is no t yet approved or cleared by the [place] FDA and  has been authorized for detection and/or diagnosis of SARS-CoV-2 by FDA under an Emergency Use Authorization (EUA). This EUA will remain  in effect (meaning this test can be used) for the duration of the COVID-19 declaration under Section 564(b)(1) of the Act, 21 U.S.C.section 360bbb-3(b)(1), unless the authorization is terminated  or revoked sooner.       Influenza A by PCR NEGATIVE NEGATIVE Final   Influenza B by PCR NEGATIVE NEGATIVE Final     Comment: (NOTE) The Xpert Xpress SARS-CoV-2/FLU/RSV plus assay is intended as an aid in the diagnosis of influenza from Nasopharyngeal swab specimens and should not be used as a sole basis for treatment. Nasal washings and aspirates are unacceptable for Xpert Xpress SARS-CoV-2/FLU/RSV testing.  Fact Sheet for Patients: BloggerCourse.com  Fact Sheet for Healthcare Providers: SeriousBroker.it  This test is not yet approved or cleared by the [place] FDA and has been authorized for detection and/or diagnosis of SARS-CoV-2 by FDA under an Emergency Use Authorization (EUA). This EUA will remain in effect (meaning this test can be used) for the duration of the COVID-19 declaration under Section 564(b)(1) of the Act, 21 U.S.C. section 360bbb-3(b)(1), unless the authorization is terminated or revoked.  Performed at [facility], [address] [place], [place] [address]   Blood Culture (routine x 2)     Status: None (Preliminary result)   Collection Time: 2022-03-17 12:45 PM   Specimen: BLOOD  Result Value Ref Range Status   Specimen Description BLOOD BLOOD RIGHT FOREARM  Final   Special Requests   Final    BOTTLES DRAWN AEROBIC AND ANAEROBIC Blood Culture adequate volume   Culture  Setup Time   Final    Organism ID to follow GRAM POSITIVE COCCI IN BOTH AEROBIC AND ANAEROBIC BOTTLES CRITICAL RESULT CALLED TO, READ BACK BY AND VERIFIED WITH: Bralynn Donado AT 2208 ON 2022/03/17 BY SS Performed at [facility], [address], [place], [place] [address]    Culture [facility] POSITIVE COCCI  Final   Report Status PENDING  Incomplete  Blood Culture ID Panel (Reflexed)     Status: Abnormal   Collection Time: 03/17/22 12:45 PM  Result Value Ref Range  Status   Enterococcus faecalis NOT DETECTED NOT DETECTED Final   Enterococcus Faecium NOT DETECTED NOT DETECTED Final   Listeria monocytogenes NOT DETECTED NOT DETECTED Final    Staphylococcus species DETECTED (A) NOT DETECTED Final    Comment: CRITICAL RESULT CALLED TO, READ BACK BY AND VERIFIED WITH: Jaslin Novitski AT 2208 ON 03/05/2022 BY SS    Staphylococcus aureus (BCID) DETECTED (A) NOT DETECTED Final    Comment: Methicillin (oxacillin)-resistant Staphylococcus aureus (MRSA). MRSA is predictably resistant to beta-lactam antibiotics (except ceftaroline). Preferred therapy is vancomycin unless clinically contraindicated. Patient requires contact precautions if  hospitalized. CRITICAL RESULT CALLED TO, READ BACK BY AND VERIFIED WITH: Christiona Siddique AT 2208 ON 02/10/2022 BY SS    Staphylococcus epidermidis NOT DETECTED NOT DETECTED Final   Staphylococcus lugdunensis NOT DETECTED NOT DETECTED Final   Streptococcus species NOT DETECTED NOT DETECTED Final   Streptococcus agalactiae NOT DETECTED NOT DETECTED Final   Streptococcus pneumoniae NOT DETECTED NOT DETECTED Final   Streptococcus pyogenes NOT DETECTED NOT DETECTED Final   A.calcoaceticus-baumannii NOT DETECTED NOT DETECTED Final   Bacteroides fragilis NOT DETECTED NOT DETECTED Final   Enterobacterales NOT DETECTED NOT DETECTED Final   Enterobacter cloacae complex NOT DETECTED NOT DETECTED Final   Escherichia coli NOT DETECTED NOT DETECTED Final   Klebsiella aerogenes NOT DETECTED NOT DETECTED Final   Klebsiella oxytoca NOT DETECTED NOT DETECTED Final   Klebsiella pneumoniae NOT DETECTED NOT DETECTED Final   Proteus species NOT DETECTED NOT DETECTED Final   Salmonella species NOT DETECTED NOT DETECTED Final   Serratia marcescens NOT DETECTED NOT DETECTED Final   Haemophilus influenzae NOT DETECTED NOT DETECTED Final   Neisseria meningitidis NOT DETECTED NOT DETECTED Final   Pseudomonas aeruginosa NOT DETECTED NOT DETECTED Final   Stenotrophomonas maltophilia NOT DETECTED NOT DETECTED Final   Candida albicans NOT DETECTED NOT DETECTED Final   Candida auris NOT DETECTED NOT DETECTED Final   Candida glabrata NOT  DETECTED NOT DETECTED Final   Candida krusei NOT DETECTED NOT DETECTED Final   Candida parapsilosis NOT DETECTED NOT DETECTED Final   Candida tropicalis NOT DETECTED NOT DETECTED Final   Cryptococcus neoformans/gattii NOT DETECTED NOT DETECTED Final   Meth resistant mecA/C and MREJ DETECTED (A) NOT DETECTED Final    Comment: CRITICAL RESULT CALLED TO, READ BACK BY AND VERIFIED WITH: Lanisha Stepanian AT 2208 ON 02/09/2022 BY SS Performed at [facility], [address] [place], [place] [address]   Blood Culture (routine x 2)     Status: None (Preliminary result)   Collection Time: 02/17/2022 12:47 PM   Specimen: BLOOD  Result Value Ref Range Status   Specimen Description BLOOD LEFT ANTECUBITAL  Final   Special Requests   Final    BOTTLES DRAWN AEROBIC AND ANAEROBIC Blood Culture adequate volume   Culture  Setup Time   Final    GRAM POSITIVE COCCI IN BOTH AEROBIC AND ANAEROBIC BOTTLES CRITICAL VALUE NOTED.  VALUE IS CONSISTENT WITH PREVIOUSLY REPORTED AND CALLED VALUE. Performed at [facility], [address] [place], [place] [address]    Culture [facility] POSITIVE COCCI  Final   Report Status PENDING  Incomplete   BCID:  4 of 4 bottle w/ MRSA.  Pt currently on Aztreonam, Flagyl & Vancomycin.  Name of provider contacted: Ian Bushman, MD  Changes to prescribed antibiotics required: Stop Aztreonam & Flagyl, continue Vancomycin.  Otelia Sergeant, PharmD, [facility] 03/06/2022 10:27 PM

## 2022-02-16 NOTE — ED Notes (Signed)
Ed md aware of elevated troponin 342

## 2022-02-16 NOTE — H&P (Addendum)
History and Physical    Patient: Kaylee Santiago:[id number] DOB: 1935/05/26 DOA: 03/08/2022 DOS: the patient was seen and examined on 02/15/2022 PCP: [facility]  Patient coming from: [facility] via EMS  Chief Complaint:  Chief Complaint  Patient presents with   Altered Mental Status    Chronic back pain , pain 10/10, no fall, per ems more altered than normal    HPI: Kaylee Santiago is a 87 y.o. female with medical history significant of hypertension, atrial fibrillation on Xarelto, CVA in 10/2021,  bronchiectasis,hypothyroidism, and compression fractures who presented from her nursing facility after being noted to be more confused than baseline.  At this time patient is able to tell me her name and states that she is in hospital, but complains of having severe lower back pain for which she is unable to move her legs that worsens the symptom.  Denies any complaints of chest pain at this time.  Additional history is obtained from the patient's niece who is present at bedside.  Patient previous stroke in April of this year noted ill-defined restricted diffusion in the right frontoparietal and occipitotemporal cortices thought to be acute/subacute by MRI.  During that hospitalization patient had a PEG tube which was placed but ultimately was removed at some point time at the patient's request.  She had gone to [facility] and had been there until June when she was sent to assisted living at [facility].  Patient had previously been able to get around without need of assistance.  The patient had reportedly suffered compression fractures of her lumbar spine after making her bed.  She personally never reported having any falls.  She had recently been seen by neurosurgery 7/27 for these compression fractures, but was deemed not a candidate for kyphoplasty and recommended to wear a TLSO/LSO brace.  Her niece states that normally patient was able to recognize family and only on occasion  was known to say something that was the norm.  However, today patient did not recognize who she was until she brought up the fact that she was her sisters daughter.  However, patient later on stated that no family was in the room despite her being right next to her which was unusual.  Since the patient has been having issues with her back she really had not been eating or drinking much.  In route with EMS patient was reported to have fever up to 100.4 F.   In the emergency department patient was noted to afebrile with stable vital signs.  Labs significant for WBC 22.7, hemoglobin 11.8, sodium 134, BUN 25, creatinine 0.8, alkaline phosphatase 171, AST 77, ALT 46, high-sensitivity troponin 342, and lactic acid 2.4.  Chest x-ray noted chronic bibasilar opacities left greater than right thought secondary to bronchiectasis with no new focal consolidation.  Urinalysis noted small hemoglobin, 20 ketones, 0-5 RBCs/hpf, and no other acute findings for infection.   Review of Systems: As mentioned in the history of present illness. All other systems reviewed and are negative. Past Medical History:  Diagnosis Date   Atrial fibrillation [facility])    Atrial fibrillation (HCC)    Back pain    Dysrhythmia    Hypertension    Hypothyroidism    Stroke [facility])    Past Surgical History:  Procedure Laterality Date   ABDOMINAL HYSTERECTOMY     BREAST EXCISIONAL BIOPSY Right 70's   CATARACT EXTRACTION W/ INTRAOCULAR LENS  IMPLANT, BILATERAL     KYPHOPLASTY N/A 12/21/2018  Procedure: KYPHOPLASTY L1;  Surgeon: Kennedy Bucker, MD;  Location: [facility] ORS;  Service: Orthopedics;  Laterality: N/A;   Social History:  reports that she has quit smoking. Her smoking use included cigarettes. She has never used smokeless tobacco. She reports that she does not currently use alcohol. She reports that she does not use drugs.  Allergies  Allergen Reactions   Amoxicillin-Pot Clavulanate Diarrhea   Levofloxacin Diarrhea   Buspirone  Other (See Comments)   Trazodone Other (See Comments)    nightmares    Family History  Problem Relation Age of Onset   Breast cancer Sister 29   Breast cancer Sister        49's   CAD Mother     Prior to Admission medications   Medication Sig Start Date End Date Taking? Authorizing Provider  acetaminophen (TYLENOL) 325 MG tablet Take 2 tablets (650 mg total) by mouth every 6 (six) hours as needed for mild pain (or Fever >/= 101). 01/19/22   Lynn Ito, MD  atorvastatin (LIPITOR) 40 MG tablet Place 40 mg into feeding tube daily. 11/02/21   [provider]  Lactobacillus Rhamnosus, GG, (CULTURELLE) CAPS Take 1 capsule by mouth daily.    [provider]  levothyroxine (SYNTHROID) 75 MCG tablet Take 1 tablet by mouth daily. 03/03/21   [provider]  lisinopril (ZESTRIL) 30 MG tablet Take 30 mg by mouth daily. 12/30/21   [provider]  LORazepam (ATIVAN) 0.5 MG tablet Take 0.5 mg by mouth at bedtime as needed for sleep.    [provider]  melatonin 3 MG TABS tablet Place 3 mg into feeding tube at bedtime. 11/02/21   [provider]  metoprolol tartrate (LOPRESSOR) 25 MG tablet Place 1 tablet (25 mg total) into feeding tube 2 (two) times daily. 11/09/21   Darlin Priestly, MD  oxyCODONE (OXY IR/ROXICODONE) 5 MG immediate release tablet Take 5 mg by mouth every 6 (six) hours as needed for severe pain.    [provider]  polyethylene glycol powder (GLYCOLAX/MIRALAX) 17 GM/SCOOP powder Place 17 g into feeding tube. 11/02/21   [provider]  senna (SENOKOT) 8.6 MG tablet Place 8.6 mg into feeding tube at bedtime. 11/02/21   [provider]  sodium chloride 1 g tablet Take 1 tablet (1 g total) by mouth 2 (two) times daily with a meal. 10/10/21   Renae Gloss, Richard, MD  tiZANidine (ZANAFLEX) 2 MG tablet Take 2 mg by mouth every 8 (eight) hours as needed. 01/08/22 01/08/23  [provider]  XARELTO 20 MG TABS tablet Take 20  mg by mouth daily. 05/12/21   [provider]    Physical Exam: Vitals:   02/21/2022 1130 03/05/2022 1258 02/23/2022 1400  BP: 118/79 122/81 130/74  Pulse: 88 98 100  Resp: 17 17 16   Temp: 98.5 F (36.9 C) 98.9 F (37.2 C) 98.8 F (37.1 C)  TempSrc: Oral  Oral  SpO2: 98% 93% 95%   Exam  Constitutional: Thin elderly female who appears to be in acute distress Eyes:  lids and conjunctivae normal ENMT: Mucous membranes are dry. Neck: normal, supple  Respiratory: clear to auscultation bilaterally, no wheezing, no crackles. Normal respiratory effort.   Cardiovascular: Irregular irregular.  No extremity edema. 2+ pedal pulses.  .  Abdomen: no tenderness, no masses palpated.  Bowel sounds positive.  Musculoskeletal: no clubbing / cyanosis.  Tenderness palpation of the lumbar spine. Skin: no rashes, lesions, or ulcers.  Poor skin turgor Neurologic:  Unable  to appropriately test for strength due to patient being in severe back pain.  Speech was slurred at times. Psychiatric: Alert and oriented at least to Over[place] and place.  Data Reviewed:  EKG reveals atrial fibrillation at 110 bpm.  Otherwise reviewed labs, imaging, and pertinent records as noted above in the HPI.   Assessment and Plan: SIRS lactic acidosis Acute.  Patient was noted to be febrile up to 100.4 F with reports of mild tachycardia, initial WBC elevated at 22.7, and lactic acid 2.4.  Influenza and COVID-19 screening were negative.  Urinalysis did not appear to show any signs of infection.  Chest x-ray noted chronic bibasilar opacities left worse than right thought secondary to bronchiectasis.  Blood cultures had been obtained.  Patient was given 1 L IV fluids, vancomycin, metronidazole, and aztreonam.  Repeat lactic acid within normal limits at 1.9.  Question if symptoms are secondary to infection versus possibly secondary to acute on chronic compression fractures with dehydration. -Admit to a progressive bed -Follow-up  blood cultures -Check procalcitonin -Continue empiric antibiotics of vancomycin, metronidazole, and aztreonam.  De-escalate when medically appropriate. -Acetaminophen as needed for fever  Elevated troponin Acute.  High-sensitivity troponin elevated at 342->348.  Unable to continue anticoagulation as reported concern for possible bleed on CT scan of the head. -Check echocardiogram  Acute encephalopathy acute CVA Patient was noted to be more altered than her baseline.  CT noted new areas of hypodensity in the right posterior lateral frontal lobe and right occipital lobe concerning for subacute infarcts.  MRI confirmed multiple areas of acute or subacute infarct compatible with cerebral emboli with microhemorrhage in the right frontal lobe.  Suspect patient being acutely altered related to strokes that were found.  However other causes include uncontrolled pain or infection. -Neurochecks -Check lipid panel and hemoglobin A1c -Aspirin -PT/OT/speech to evaluate and treat -Allow for permissive hypertension at this time  Atrial fibrillation on chronic anticoagulation Patient was noted to be fibrillation with heart rates around 110.  Patient normally on metoprolol 25 mg twice daily and Xarelto. -Held Xarelto due to initial microhemorrhage -Resume metoprolol when medically appropriate  Intractable back pain secondary to compression fracture T11 and lumbar spine Acute on chronic.  Patient noted to have acute compression fracture of T11 with 40% vertebral body height loss.  Records note that she had last been seen by neurosurgery on 7/27 for compression fractures of L1, L2, L3, and L5.  The worst of the compression fractures was noted to be at L3 with 90% height loss and mild segmental kyphosis x-ray on 7/27 thought not to be a candidate for kyphoplasty.  Patient has been recommended to continue TLSO versus LSO brace and referred to pain management.  Question possibility of patient falling given what  appears to be a new T11 compression fracture. -Check CK as urinalysis gives possibility of rhabdo -kpad -TLSO brace to be fitted -oxycodone/fentanyl IV as needed for moderate to severe pain respectively  Essential hypertension Home blood pressure regimen includes metoprolol 25 mg twice daily and lisinopril 30 mg daily. -Held home blood pressure regimen to allow for permissive hypertension at this time  Hyponatremia Chronic.  Sodium 134.  Suspecting a hypovolemic hyponatremia based off elevated BUN to creatinine ratio and poor skin turgor on physical exam. -Normal saline IV fluids at 100 mL/h -Recheck kidney function in a.m.  Bronchiectasis without complication -Incentive spirometry -Breathing treatments as needed  Hypothyroidism TSH was 1.87 in 09/2021.  Home regimen includes levothyroxine 75 mcg daily. -Check TSH -  Continue levothyroxine  Transaminitis Acute.  Labs reveal alkaline phosphatase 171, AST 77, and ALT 46.   -Recheck levels in a.m.  Hyperlipidemia Home medication regimen includes atorvastatin 40 mg daily. -Follow-up lipid -Continue atorvastatin    Advance Care Planning:   Code Status: DNR    Consults: Nephrology  Family Communication: Patient's niece updated at bedside  Severity of Illness: The appropriate patient status for this patient is INPATIENT. Inpatient status is judged to be reasonable and necessary in order to provide the required intensity of service to ensure the patient's safety. The patient's presenting symptoms, physical exam findings, and initial radiographic and laboratory data in the context of their chronic comorbidities is felt to place them at high risk for further clinical deterioration. Furthermore, it is not anticipated that the patient will be medically stable for discharge from the hospital within 2 midnights of admission.   * I certify that at the point of admission it is my clinical judgment that the patient will require inpatient  hospital care spanning beyond 2 midnights from the point of admission due to high intensity of service, high risk for further deterioration and high frequency of surveillance required.*  Author: Clydie Braun, MD 03/05/2022 3:32 PM  For on call review [email or web address].

## 2022-02-16 NOTE — Consult Note (Signed)
CODE SEPSIS - PHARMACY COMMUNICATION  **Broad Spectrum Antibiotics should be administered within 1 hour of Sepsis diagnosis**  Time Code Sepsis Called/Page Received: 1243  Antibiotics Ordered: aztreonam,vancomycin, metronidazole  Time of 1st antibiotic administration: 1245  Additional action taken by pharmacy: none  If necessary, Name of Provider/Nurse Contacted: n/a    Bettey Costa ,PharmD Clinical Pharmacist  02/15/2022  1:35 PM

## 2022-02-16 NOTE — Consult Note (Signed)
Pharmacy Antibiotic Note  Kaylee Santiago is a 87 y.o. female admitted on 03/02/2022 with sepsis.  Pharmacy has been consulted for Vancomycin dosing. Patient is also receiving Aztreonam and Metronidazole.  Plan: Vancomycin 1g IV x 1 as loading dose, followed by Vancomycin 750 mg IV Q 24 hrs. Goal AUC 400-550. Expected AUC: 521.7 Expected Css: 13.8, TBW used in calculation(TBW<IBW) SCr used: 0.85, Vd used: 0.72     Temp (24hrs), Avg:98.5 F (36.9 C), Min:97.9 F (36.6 C), Max:98.9 F (37.2 C)  Recent Labs  Lab 02/15/2022 1136 03/01/2022 1400  WBC 22.7*  --   CREATININE 0.86  --   LATICACIDVEN 2.4* 1.9    Estimated Creatinine Clearance: 40.4 mL/min (by C-G formula based on SCr of 0.86 mg/dL).    Allergies  Allergen Reactions   Amoxicillin-Pot Clavulanate Diarrhea   Levofloxacin Diarrhea   Buspirone Other (See Comments)   Trazodone Other (See Comments)    nightmares    Antimicrobials this admission: Vancomycin 8/8 >>  Aztreonam 8/8 >>   Dose adjustments this admission: N/A  Microbiology results: 8/8 BCx: collected 8/8 UCx: collected  8/8 MRSA PCR: pending  Thank you for allowing pharmacy to be a part of this patient's care.  Harrell Lark A Analayah Brooke 03/06/2022 6:59 PM

## 2022-02-16 NOTE — Consult Note (Signed)
PHARMACY -  BRIEF ANTIBIOTIC NOTE   Pharmacy has received consult(s) for Vancomycin and Aztreonam from an ED provider.  The patient's profile has been reviewed for ht/wt/allergies/indication/available labs.    One time order(s) placed for Vancomycin 1g IV and Aztreonam 2g IV x 1 dose each.  Further antibiotics/pharmacy consults should be ordered by admitting physician if indicated.                       Thank you, Bettey Costa 02/25/2022  12:38 PM

## 2022-02-16 NOTE — Progress Notes (Signed)
ID [facility] alert for MRSA 4/4 Continue vanoc DC aztreonam and flagyl- Full consult tomorrow

## 2022-02-16 NOTE — ED Triage Notes (Signed)
Back pain , altered , unknown when started

## 2022-02-17 ENCOUNTER — Inpatient Hospital Stay: Payer: Medicare Other

## 2022-02-17 ENCOUNTER — Other Ambulatory Visit: Payer: Self-pay

## 2022-02-17 DIAGNOSIS — R6520 Severe sepsis without septic shock: Secondary | ICD-10-CM | POA: Diagnosis not present

## 2022-02-17 DIAGNOSIS — I639 Cerebral infarction, unspecified: Secondary | ICD-10-CM | POA: Diagnosis not present

## 2022-02-17 DIAGNOSIS — B9562 Methicillin resistant Staphylococcus aureus infection as the cause of diseases classified elsewhere: Secondary | ICD-10-CM

## 2022-02-17 DIAGNOSIS — S32030D Wedge compression fracture of third lumbar vertebra, subsequent encounter for fracture with routine healing: Secondary | ICD-10-CM | POA: Diagnosis not present

## 2022-02-17 DIAGNOSIS — G9340 Encephalopathy, unspecified: Secondary | ICD-10-CM | POA: Diagnosis not present

## 2022-02-17 DIAGNOSIS — S22080A Wedge compression fracture of T11-T12 vertebra, initial encounter for closed fracture: Secondary | ICD-10-CM | POA: Diagnosis not present

## 2022-02-17 DIAGNOSIS — R7881 Bacteremia: Secondary | ICD-10-CM | POA: Diagnosis present

## 2022-02-17 DIAGNOSIS — R4182 Altered mental status, unspecified: Secondary | ICD-10-CM

## 2022-02-17 DIAGNOSIS — I69391 Dysphagia following cerebral infarction: Secondary | ICD-10-CM

## 2022-02-17 DIAGNOSIS — R748 Abnormal levels of other serum enzymes: Secondary | ICD-10-CM

## 2022-02-17 DIAGNOSIS — A419 Sepsis, unspecified organism: Secondary | ICD-10-CM | POA: Diagnosis not present

## 2022-02-17 DIAGNOSIS — E785 Hyperlipidemia, unspecified: Secondary | ICD-10-CM | POA: Diagnosis present

## 2022-02-17 HISTORY — DX: Abnormal levels of other serum enzymes: R74.8

## 2022-02-17 LAB — CBC
HCT: 36.1 % (ref 36.0–46.0)
Hemoglobin: 12 g/dL (ref 12.0–15.0)
MCH: 30.2 pg (ref 26.0–34.0)
MCHC: 33.2 g/dL (ref 30.0–36.0)
MCV: 90.9 fL (ref 80.0–100.0)
Platelets: 202 10*3/uL (ref 150–400)
RBC: 3.97 MIL/uL (ref 3.87–5.11)
RDW: 13.7 % (ref 11.5–15.5)
WBC: 22.6 10*3/uL — ABNORMAL HIGH (ref 4.0–10.5)
nRBC: 0 % (ref 0.0–0.2)

## 2022-02-17 LAB — COMPREHENSIVE METABOLIC PANEL
ALT: 44 U/L (ref 0–44)
AST: 67 U/L — ABNORMAL HIGH (ref 15–41)
Albumin: 2.7 g/dL — ABNORMAL LOW (ref 3.5–5.0)
Alkaline Phosphatase: 156 U/L — ABNORMAL HIGH (ref 38–126)
Anion gap: 9 (ref 5–15)
BUN: 24 mg/dL — ABNORMAL HIGH (ref 8–23)
CO2: 25 mmol/L (ref 22–32)
Calcium: 8.2 mg/dL — ABNORMAL LOW (ref 8.9–10.3)
Chloride: 100 mmol/L (ref 98–111)
Creatinine, Ser: 0.74 mg/dL (ref 0.44–1.00)
GFR, Estimated: >60 mL/min (ref >60)
Glucose, Bld: 143 mg/dL — ABNORMAL HIGH (ref 70–99)
Potassium: 4.1 mmol/L (ref 3.5–5.1)
Sodium: 134 mmol/L — ABNORMAL LOW (ref 135–145)
Total Bilirubin: 1 mg/dL (ref 0.3–1.2)
Total Protein: 5.9 g/dL — ABNORMAL LOW (ref 6.5–8.1)

## 2022-02-17 LAB — ECHOCARDIOGRAM COMPLETE: S' Lateral: 2.3 cm

## 2022-02-17 LAB — MRSA NEXT GEN BY PCR, NASAL: MRSA by PCR Next Gen: DETECTED — AB

## 2022-02-17 MED ORDER — METOPROLOL TARTRATE 5 MG/5ML IV SOLN
5.0000 mg | Freq: Once | INTRAVENOUS | Status: AC
  Administered 2022-02-17: 5 mg via INTRAVENOUS
  Filled 2022-02-17: qty 5

## 2022-02-17 MED ORDER — METOPROLOL TARTRATE 5 MG/5ML IV SOLN
2.5000 mg | Freq: Four times a day (QID) | INTRAVENOUS | Status: DC
  Administered 2022-02-17 – 2022-02-18 (×4): 2.5 mg via INTRAVENOUS
  Filled 2022-02-17 (×5): qty 5

## 2022-02-17 MED ORDER — CHLORHEXIDINE GLUCONATE CLOTH 2 % EX PADS
6.0000 | MEDICATED_PAD | Freq: Every day | CUTANEOUS | Status: DC
  Administered 2022-02-18: 6 via TOPICAL

## 2022-02-17 MED ORDER — METOPROLOL TARTRATE 5 MG/5ML IV SOLN
5.0000 mg | Freq: Once | INTRAVENOUS | Status: AC
Start: 2022-02-17 — End: 2022-02-17
  Administered 2022-02-17: 5 mg via INTRAVENOUS
  Filled 2022-02-17: qty 5

## 2022-02-17 MED ORDER — MUPIROCIN 2 % EX OINT
1.0000 | TOPICAL_OINTMENT | Freq: Two times a day (BID) | CUTANEOUS | Status: DC
  Administered 2022-02-17 – 2022-02-18 (×2): 1 via NASAL
  Filled 2022-02-17: qty 22

## 2022-02-17 NOTE — Assessment & Plan Note (Signed)
Patient presented with altered mental status/encephalopathy.  Noncontrast head CT showed new areas of hypodensity in the right posterior lateral frontal lobe and right occipital lobe concerning for subacute infarct.  MRI confirmed multiple areas of acute versus subacute infarcts compatible with cerebral emboli with microhemorrhage noted in the right frontal lobe. -- Neurology following -- Continue neurochecks -- Hold Xarelto -- Permissive hypertension -- Follow-up PT OT SLP recommendations --Continue aspirin -- Risk factor modification

## 2022-02-17 NOTE — Progress Notes (Signed)
Orthopedic Tech Progress Note Patient Details:  Kaylee Santiago July 13, 1934 [id number]  Called in order to [facility] for a TLSO/LSO BRACE   Patient ID: [name], female   DOB: 11/15/1934, 87 y.o.   MRN: [id number]  [name] 02/17/2022, 9:06 AM

## 2022-02-17 NOTE — Assessment & Plan Note (Signed)
Currently in A-fib RVR secondary to metoprolol being held for permissive hypertension in setting of acute CVA. 8/10: HR's this AM 140's to 150's --Discussed with neurology re permissive HTN, okay with Cardizem drip --Transfer to Progressive --Cardizem load & drip 5-15 mg/hr --Home metoprolol on hold --IV metoprolol 2.5 mg PRN HR > 120 despite drip --Telemetry --Cardiology following, appreciate recommendations --Hold Xarelto x 1 week from symptom onset per neurology

## 2022-02-17 NOTE — Plan of Care (Signed)
Pt just admitted today + for stroke.  They are hold ingxarelto and BP meds.  But she's in uncontrolled Afib running from high 130's to 160's.  She has uncontrolled pain for back compression fractures. Reached out to on call NP - treating pain but didn't lower HR.  She ordered 5 mg IV metoprolol and it dropped HR into the 110's. Pt appears more comfortable.

## 2022-02-17 NOTE — Progress Notes (Addendum)
Initial Nutrition Assessment  DOCUMENTATION CODES:   Severe malnutrition in context of chronic illness  INTERVENTION:   Nepro Shake po TID, each supplement provides 425 kcal and 19 grams protein  Magic cup TID with meals, each supplement provides 290 kcal and 9 grams of protein  Pt at high refeed risk; recommend monitor potassium, magnesium and phosphorus labs daily until stable  Vitamin C 528m po BID  MVI po daily   NUTRITION DIAGNOSIS:   Severe Malnutrition related to chronic illness (COPD, pulmonary fibrosis, multiple CVAs, dysphagia) as evidenced by severe fat depletion, severe muscle depletion.  GOAL:   Patient will meet greater than or equal to 90% of their needs  MONITOR:   PO intake, Supplement acceptance, Labs, Weight trends, Skin, I & O's  REASON FOR ASSESSMENT:   Malnutrition Screening Tool    ASSESSMENT:   87y/o female with h/o Afib, HTN, hypothyroidism, hyponatremia, HLD, COVID 19 (12/22), COPD, compression fractures s/p kyphoplasty (2020) and CVA resulting in dysphagia requiring IR G-tube 4/21 (removed 6/2) and who is now admitted with new CVA.  Met with pt and pt's niece in room today. Pt is alert and talking, however, is difficult to understand. Pt reports that she has been feeling terrible for the past 3 days. Pt with severe back pain and is unable to sit up in bed. Pt with h/o CVA in April resulting in dysphagia with IR G-tube placement and nutrition support. Pt's G-tube was removed per her request in June. Pt reports that she has been eating ok at home. Pt reports drinking vanilla Ensure. Per chart, pt is down 7lbs(6%) over the past month; this is significant weight loss. RD suspects pt with poor oral intake at baseline. SLP evaluated pt today and recommended dysphagia 1/nectar thick liquids. RD will add supplements and vitamins to help pt meet her estimated needs. Pt is at high refeed risk. Pt's niece at bedside reports that pt would not likely want  another feeding tube as pt reported that once her last tube came out it was never going back in. RD will monitor for [place]   Medications reviewed and include: aspirin, synthroid, psyllium, florastor, NaCl '@75ml' /hr, vancomycin   Labs reviewed: Na 134(L), K 4.1 wnl, BUN 24(H) WBC- 22.6(H)  NUTRITION - FOCUSED PHYSICAL EXAM:  Flowsheet Row Most Recent Value  Orbital Region Severe depletion  Upper Arm Region Severe depletion  Thoracic and Lumbar Region Severe depletion  Buccal Region Severe depletion  Temple Region Severe depletion  Clavicle Bone Region Severe depletion  Clavicle and Acromion Bone Region Severe depletion  Scapular Bone Region Severe depletion  Dorsal Hand Severe depletion  Patellar Region Severe depletion  Anterior Thigh Region Severe depletion  Posterior Calf Region Severe depletion  Edema (RD Assessment) None  Hair Reviewed  Eyes Reviewed  Mouth Reviewed  Skin Reviewed  Nails Reviewed   Diet Order:   Diet Order             Diet NPO time specified  Diet effective now                  EDUCATION NEEDS:   Education needs have been addressed  Skin:  Skin Assessment: Reviewed RN Assessment (Stage II sacrum)  Last BM:  pta  Height:   Ht Readings from Last 1 Encounters:  02/04/22 '5\' 10"'  (1.778 m)    Weight:   Wt Readings from Last 1 Encounters:  02/17/22 49.5 kg    Ideal Body Weight:  68.1 kg  BMI:  Body mass index is 15.66 kg/m.  Estimated Nutritional Needs:   Kcal:  1500-1700kcal/day  Protein:  75-85g/day  Fluid:  1.3-1.5L/day  Koleen Distance MS, RD, LDN Please refer to [facility] for RD and/or RD on-call/weekend/after hours pager

## 2022-02-17 NOTE — Consult Note (Signed)
INCOMPLETE CONSULT NOTE   Neurosurgery-New Consultation Evaluation 02/17/2022 Kaylee Santiago [id number]  Identifying Statement: Kaylee Santiago is a 88 y.o. female from [place] [id number] with a history of AF, HTN, CVA, and dementia  Physician Requesting Consultation: No ref. provider found  History of Present Illness: Kaylee Santiago is a 87 y.o female presenting to the ER yesterday from her facility for increased confusion from her baseline. She is well known to our service for compression fractures. She has been complaining more of back pain recently.  Today she is largely somnolent and only able to answer yes/no questions. She admits to back pain but denies any radicular complaints. Patient's niece, Bonnita Nasuti is at bedside and endorses that the patient is not at her cognitive baseline.   Past Medical History:  Past Medical History:  Diagnosis Date   Atrial fibrillation ([place])    Atrial fibrillation ([place])    Back pain    Dysrhythmia    Hypertension    Hypothyroidism    Stroke [facility])     Social History: Social History   Socioeconomic History   Marital status: Widowed    Spouse name: Not on file   Number of children: Not on file   Years of education: Not on file   Highest education level: Not on file  Occupational History   Not on file  Tobacco Use   Smoking status: Former    Types: Cigarettes   Smokeless tobacco: Never  Vaping Use   Vaping Use: Never used  Substance and Sexual Activity   Alcohol use: Not Currently   Drug use: Never   Sexual activity: Not on file  Other Topics Concern   Not on file  Social History Narrative   Lives at home independently.  Ambulates steadily.   Social Determinants of Health   Financial Resource Strain: Not on file  Food Insecurity: Not on file  Transportation Needs: Not on file  Physical Activity: Not on file  Stress: Not on file  Social Connections: Not on file  Intimate Partner Violence: Not on file   Living arrangements (living  alone, with partner): nursing facility   Family History: Family History  Problem Relation Age of Onset   Breast cancer Sister 76   Breast cancer Sister        60's   CAD Mother     Review of Systems:  Review of Systems - patient unable to provide accurate ROS due to change in mental status  Physical Exam: BP (!) 156/103 (BP Location: Left Arm)   Pulse (!) 115   Temp 99.1 F (37.3 C)   Resp 16   SpO2 95%  There is no height or weight on file to calculate BMI. There is no height or weight on file to calculate BSA. General appearance: Alert, cooperative, in no acute distress Head: Normocephalic, atraumatic Eyes: Normal, EOM intact Oropharynx: Moist without lesions Neck: Supple, no tenderness Heart: Normal, regular rate and rhythm, without murmur Lungs: Clear to auscultation, good air exchange Abdomen: Soft, nondistended Ext: No edema in LE bilaterally, good distal pulses  Neurologic exam:  Mental status: Somnolent but arouses easily to voice. Oriented to [place] and place  Speech: quiet but intelligible  Cranial nerves:  CN II-XII grossly intact  Motor: MAEW and equally  Sensory: intact to light touch in all extremities Reflexes: 2+ and symmetric bilaterally for arms and legs Coordination: unable to participate  Gait: untested   Laboratory: Results for orders placed or performed during the hospital encounter  of 02/24/2022  Resp Panel by RT-PCR (Flu A&B, Covid) Anterior Nasal Swab   Specimen: Anterior Nasal Swab  Result Value Ref Range   SARS Coronavirus 2 by RT PCR NEGATIVE NEGATIVE   Influenza A by PCR NEGATIVE NEGATIVE   Influenza B by PCR NEGATIVE NEGATIVE  Blood Culture (routine x 2)   Specimen: BLOOD  Result Value Ref Range   Specimen Description      BLOOD BLOOD RIGHT FOREARM Performed at [facility], [address], [place], [place] [address]    Special Requests      BOTTLES DRAWN AEROBIC AND ANAEROBIC Blood Culture adequate volume Performed at  [facility], [address] [place], [place] [address]    Culture  Setup Time      GRAM POSITIVE COCCI IN BOTH AEROBIC AND ANAEROBIC BOTTLES CRITICAL RESULT CALLED TO, READ BACK BY AND VERIFIED WITH: NATHAN BELUE AT 2208 ON 02/09/2022 BY SS    Culture (A)     STAPHYLOCOCCUS AUREUS SUSCEPTIBILITIES TO FOLLOW Performed at [facility], [address], [place], [place] [address]    Report Status PENDING   Blood Culture (routine x 2)   Specimen: BLOOD  Result Value Ref Range   Specimen Description      BLOOD LEFT ANTECUBITAL Performed at [facility], [address] [place], [place] [address]    Special Requests      BOTTLES DRAWN AEROBIC AND ANAEROBIC Blood Culture adequate volume Performed at [facility], [address] [place], [place] [address]    Culture  Setup Time      GRAM POSITIVE COCCI IN BOTH AEROBIC AND ANAEROBIC BOTTLES CRITICAL VALUE NOTED.  VALUE IS CONSISTENT WITH PREVIOUSLY REPORTED AND CALLED VALUE.    Culture STAPHYLOCOCCUS AUREUS (A)    Report Status PENDING   Blood Culture ID Panel (Reflexed)  Result Value Ref Range   Enterococcus faecalis NOT DETECTED NOT DETECTED   Enterococcus Faecium NOT DETECTED NOT DETECTED   Listeria monocytogenes NOT DETECTED NOT DETECTED   Staphylococcus species DETECTED (A) NOT DETECTED   Staphylococcus aureus (BCID) DETECTED (A) NOT DETECTED   Staphylococcus epidermidis NOT DETECTED NOT DETECTED   Staphylococcus lugdunensis NOT DETECTED NOT DETECTED   Streptococcus species NOT DETECTED NOT DETECTED   Streptococcus agalactiae NOT DETECTED NOT DETECTED   Streptococcus pneumoniae NOT DETECTED NOT DETECTED   Streptococcus pyogenes NOT DETECTED NOT DETECTED   A.calcoaceticus-baumannii NOT DETECTED NOT DETECTED   Bacteroides fragilis NOT DETECTED NOT DETECTED   Enterobacterales NOT DETECTED NOT DETECTED   Enterobacter cloacae complex NOT DETECTED NOT DETECTED   Escherichia coli NOT DETECTED  NOT DETECTED   Klebsiella aerogenes NOT DETECTED NOT DETECTED   Klebsiella oxytoca NOT DETECTED NOT DETECTED   Klebsiella pneumoniae NOT DETECTED NOT DETECTED   Proteus species NOT DETECTED NOT DETECTED   Salmonella species NOT DETECTED NOT DETECTED   Serratia marcescens NOT DETECTED NOT DETECTED   Haemophilus influenzae NOT DETECTED NOT DETECTED   Neisseria meningitidis NOT DETECTED NOT DETECTED   Pseudomonas aeruginosa NOT DETECTED NOT DETECTED   Stenotrophomonas maltophilia NOT DETECTED NOT DETECTED   Candida albicans NOT DETECTED NOT DETECTED   Candida auris NOT DETECTED NOT DETECTED   Candida glabrata NOT DETECTED NOT DETECTED   Candida krusei NOT DETECTED NOT DETECTED   Candida parapsilosis NOT DETECTED NOT DETECTED   Candida tropicalis NOT DETECTED NOT DETECTED   Cryptococcus neoformans/gattii NOT DETECTED NOT DETECTED   Meth resistant mecA/C and MREJ DETECTED (A) NOT DETECTED  CBC  Result Value Ref Range   WBC 22.7 (H) 4.0 - 10.5 K/uL   RBC 3.92 3.87 - 5.11 MIL/uL   Hemoglobin 11.8 (L) 12.0 - 15.0 g/dL   HCT 35.7 (L) 36.0 - 46.0 %   MCV 91.1 80.0 - 100.0 fL   MCH 30.1 26.0 - 34.0 pg   MCHC 33.1 30.0 - 36.0 g/dL   RDW 13.8 11.5 - 15.5 %   Platelets 218 150 - 400 K/uL   nRBC 0.0 0.0 - 0.2 %  Comprehensive metabolic panel  Result Value Ref Range   Sodium 134 (L) 135 - 145 mmol/L   Potassium 4.6 3.5 - 5.1 mmol/L   Chloride 96 (L) 98 - 111 mmol/L   CO2 27 22 - 32 mmol/L   Glucose, Bld 156 (H) 70 - 99 mg/dL   BUN 25 (H) 8 - 23 mg/dL   Creatinine, Ser 0.86 0.44 - 1.00 mg/dL   Calcium 8.8 (L) 8.9 - 10.3 mg/dL   Total Protein 6.5 6.5 - 8.1 g/dL   Albumin 3.0 (L) 3.5 - 5.0 g/dL   AST 77 (H) 15 - 41 U/L   ALT 46 (H) 0 - 44 U/L   Alkaline Phosphatase 171 (H) 38 - 126 U/L   Total Bilirubin 0.9 0.3 - 1.2 mg/dL   GFR, Estimated >60 >60 mL/min   Anion gap 11 5 - 15  Urinalysis, Complete w Microscopic Anterior Nasal Swab  Result Value Ref Range   Color, Urine YELLOW (A)  YELLOW   APPearance HAZY (A) CLEAR   Specific Gravity, Urine 1.018 1.005 - 1.030   pH 5.0 5.0 - 8.0   Glucose, UA NEGATIVE NEGATIVE mg/dL   Hgb urine dipstick SMALL (A) NEGATIVE   Bilirubin Urine NEGATIVE NEGATIVE   Ketones, ur 20 (A) NEGATIVE mg/dL   Protein, ur 100 (A) NEGATIVE mg/dL   Nitrite NEGATIVE NEGATIVE   Leukocytes,Ua NEGATIVE NEGATIVE   RBC / HPF 0-5 0 - 5 RBC/hpf   WBC, UA 0-5 0 - 5 WBC/hpf   Bacteria, UA NONE SEEN NONE SEEN   Squamous Epithelial / LPF 0-5 0 - 5   Mucus PRESENT   Lactic acid, plasma  Result Value Ref Range   Lactic Acid, Venous 2.4 (HH) 0.5 - 1.9 mmol/L  Lactic acid, plasma  Result Value Ref Range   Lactic Acid, Venous 1.9 0.5 - 1.9 mmol/L  CK  Result Value Ref Range   Total CK 481 (H) 38 - 234 U/L  Magnesium  Result Value Ref Range   Magnesium 2.1 1.7 - 2.4 mg/dL  TSH  Result Value Ref Range   TSH 1.133 0.350 - 4.500 uIU/mL  Hemoglobin A1c  Result Value Ref Range   Hgb A1c MFr Bld 6.2 (H) 4.8 - 5.6 %   Mean Plasma Glucose 131.24 mg/dL  Lipid panel  Result Value Ref Range   Cholesterol 101 0 - 200 mg/dL   Triglycerides 70 <150 mg/dL   HDL 48 >40 mg/dL   Total CHOL/HDL Ratio 2.1 RATIO   VLDL 14 0 - 40 mg/dL   LDL Cholesterol 39 0 - 99 mg/dL  Procalcitonin - Baseline  Result Value Ref Range   Procalcitonin 16.81 ng/mL  CBC  Result Value Ref Range   WBC 22.6 (H) 4.0 - 10.5 K/uL   RBC 3.97 3.87 - 5.11 MIL/uL   Hemoglobin 12.0 12.0 - 15.0 g/dL   HCT 36.1 36.0 - 46.0 %   MCV 90.9 80.0 - 100.0 fL  MCH 30.2 26.0 - 34.0 pg   MCHC 33.2 30.0 - 36.0 g/dL   RDW 67.2 09.4 - 70.9 %   Platelets 202 150 - 400 K/uL   nRBC 0.0 0.0 - 0.2 %  Comprehensive metabolic panel  Result Value Ref Range   Sodium 134 (L) 135 - 145 mmol/L   Potassium 4.1 3.5 - 5.1 mmol/L   Chloride 100 98 - 111 mmol/L   CO2 25 22 - 32 mmol/L   Glucose, Bld 143 (H) 70 - 99 mg/dL   BUN 24 (H) 8 - 23 mg/dL   Creatinine, Ser 6.28 0.44 - 1.00 mg/dL   Calcium 8.2 (L) 8.9 -  10.3 mg/dL   Total Protein 5.9 (L) 6.5 - 8.1 g/dL   Albumin 2.7 (L) 3.5 - 5.0 g/dL   AST 67 (H) 15 - 41 U/L   ALT 44 0 - 44 U/L   Alkaline Phosphatase 156 (H) 38 - 126 U/L   Total Bilirubin 1.0 0.3 - 1.2 mg/dL   GFR, Estimated >36 >62 mL/min   Anion gap 9 5 - 15  ECHOCARDIOGRAM COMPLETE  Result Value Ref Range   BP 155/93 mmHg  Troponin I (High Sensitivity)  Result Value Ref Range   Troponin I (High Sensitivity) 342 (HH) <18 ng/L  Troponin I (High Sensitivity)  Result Value Ref Range   Troponin I (High Sensitivity) 348 (HH) <18 ng/L   I personally reviewed labs  Imaging:  I personally reviewed radiology studies to include:  CT abdomen/ pelvis IMPRESSION: Questionable mild rectal wall thickening versus artifact from underdistention, recommend correlation with digital rectal exam and proctoscopy.   New compression fractures of T11 and L3.   Bibasilar atelectasis and chronic interstitial changes at LEFT lower lobe.   Distended gallbladder.   Aortic Atherosclerosis (ICD10-I70.0).     Electronically Signed   By: Ulyses Southward M.D.   On: 03/04/2022 14:13  L3 fracture present on 7/4 MRI and 7/27 xrays  Impression/Plan:     1.  Diagnosis: Chronic L1, L2 and L5 compression fractures with history of L1 augmentation. L3 compression fracture with interval worsening since last hospitalization and new T11 compression fracture.   2.  Plan - treat conservatively in TLSO brace. - consider upright lumbar xrays when patient able to tolerate - remainder of care per medicine - No plan for neurosurgical intervention at this time - please call with any questions or concerns  Manning Charity PA-C Neurosurgery

## 2022-02-17 NOTE — Progress Notes (Signed)
Progress Note   Patient: Kaylee Santiago OEU:[id number] DOB: 02-28-1935 DOA: 02/09/2022     1 DOS: the patient was seen and examined on 02/17/2022   Brief hospital course:  Kaylee Santiago is a 87 y.o. female with medical history significant of hypertension, atrial fibrillation on Xarelto, CVA in 10/2021,  bronchiectasis,hypothyroidism, and recent lumbar compression fractures who presented from [facility] on 02/22/2022 with altered mental status and severe low back pain with inability to move her legs.  See H&P for remainder of HPI on admission.  In the ED, patient met SIRS criteria with fever and leukocytosis.  Blood cultures subsequently positive for MRSA.  Patient on IV vancomycin.  Evaluation for infectious source is underway.  ID is following.  Neuroimaging revealed multiple new acute versus subacute embolic CVAs involving right posterior lateral frontal lobe and right occipital lobe, microhemorrhage noted in the right frontal lobe.  For back pain, CT lumbar spine showed acute compression fracture of T11 and progression of L3 compression fracture with 6 mm retropulsion with suspected moderate canal stenosis.    Consultants:  Neurology Neurosurgery Infectious disease Cardiology  Assessment and Plan: * Severe sepsis ([place]) Present on admission.  Due to MRSA bacteremia.  Patient met SIRS criteria on admission with fever, leukocytosis and elevated lactic acid consistent with endorgan dysfunction.  Blood cultures positive for MRSA. -- Management as outlined for MRSA bacteremia  Elevated troponin Most likely demand ischemia in the setting of sepsis and A-fib RVR.  No other evidence of ACS. -- Appreciate cardiology recommendations -- Stat EKG and troponin if patient develops chest pain  Acute encephalopathy Most likely multifactorial due to acute embolic strokes and infection. -- Manage underlying conditions as outlined -- Delirium precautions -- Neurochecks  Acute CVA (cerebrovascular  accident) [facility]) Patient presented with altered mental status/encephalopathy.  Noncontrast head CT showed new areas of hypodensity in the right posterior lateral frontal lobe and right occipital lobe concerning for subacute infarct.  MRI confirmed multiple areas of acute versus subacute infarcts compatible with cerebral emboli with microhemorrhage noted in the right frontal lobe. -- Neurology following -- Continue neurochecks -- Hold Xarelto -- Permissive hypertension -- Follow-up PT OT SLP recommendations --Continue aspirin -- Risk factor modification  Chronic anticoagulation Patient takes Xarelto for A-fib, on hold due to microhemorrhage seen in the right frontal lobe on MRI.  Atrial fibrillation, persistent (HCC) Currently in A-fib RVR secondary to metoprolol being held for permissive hypertension in setting of acute CVA. --Hold home metoprolol --IV metoprolol 2.5 mg PRN HR > 120 --Hold Xarelto until cleared by neurology to resume --Monitor on telemetry --Cardiology consulted, appreciate recommendations  Compression fracture of T11 vertebra (HCC) See intractable back pain  Closed compression fracture of L3 lumbar vertebra, with routine healing, subsequent encounter See intractable migraine  Intractable back pain Secondary to acute compression fracture of T11, recent compression fractures of L1, L2, L3, L5, seen by neurosurgery on 7/27.  Conservative management was recommended including TLSO/LSO brace. -- Brace when out of bed -- Pain management per orders -- PT OT -- Neurosurgery recommends upright lumbar x-rays when patient able to tolerate.  No plan for neurosurgical intervention at this time.  Hypertension Antihypertensives on hold in the setting of acute strokes, allowing for permissive hypertension.  Hyponatremia Mild, sodium on admission 134 appears chronic.  Most likely hypovolemic given elevated BUN/Cr ratio, elevated CK, poor skin turgor on exam noted.   -- Continue  IV hydration until adequate p.o. intake -- Monitor BMP  Bronchiectasis without  complication ([place]) Stable without exacerbation at this time. -- Incentive spirometry -- As needed bronchodilators, mucolytics  Hypothyroidism Continue levothyroxine  Hyperlipidemia Continue statin  Elevated CK CK 481 consistent with mild rhabdomyolysis. -- Continue IV fluids -- Repeat CK level with morning labs  MRSA bacteremia Blood cultures from admission positive for MRSA.  Unclear source. -- Infectious disease consulted -- Continue vancomycin -- TTE pending, likely needs TEE -- Monitor fever curve, CBC, trend Pro-Cal -- Otherwise management as outlined   Transaminitis Most likely due to sepsis.  Monitor LFTs.  Pressure injury of skin POA.  I agree with the wound description as outlined below. -- Frequent repositioning -- Diligent wound care -- Monitor closely for signs of infection  Pressure Injury 02/28/2022 Sacrum Left;Right;Mid Stage 2 -  Partial thickness loss of dermis presenting as a shallow open injury with a red, pink wound bed without slough. (Active)  02/12/2022 1750  Location: Sacrum  Location Orientation: Left;Right;Mid  Staging: Stage 2 -  Partial thickness loss of dermis presenting as a shallow open injury with a red, pink wound bed without slough.  Wound Description (Comments):   Present on Admission: Yes      Dysphagia due to recent cerebrovascular accident Pt had PEG tube after prior stroke, since removed per patient's request.  Pt states would not want any artificial nutrition going forward. --SLP following --Aspiration precautions --Currently on pureed diet, nectar thick liquids  Protein-calorie malnutrition, severe Related to chronic illnesses (COPD, pulmonary fibrosis, multiple CVAs, dysphagia), as evidenced by severe fat and muscle depletion. --Appreciate dietitian recommendations -- Started on Nepro shakes, Magic cups, vitamin C and multivitamin -- Monitor  and encourage p.o. intake -- Patient previously had PEG tube after prior stroke which was removed at her request and patient has stated she would not want any feeding tube or artificial nutrition        Subjective: Patient awake resting in bed when seen on rounds today.  She continues to have somewhat altered mental status and dysarthria.  She is able to express needs however.  She repeatedly asked for a drink of cold water.  Reports ongoing back pain.  Otherwise does not offer up acute complaints.  Bedside RN reported that patient stated to her multiple times today that she felt she might be dying.  Reiterated multiple times that she wishes to be DNR, no code.  Palliative consulted.  Physical Exam: Vitals:   02/17/22 1256 02/17/22 1455 02/17/22 1652 02/17/22 1729  BP:  (!) 154/92 (!) 170/85   Pulse:  (!) 121 (!) 138   Resp:  16  18  Temp:  98.8 F (37.1 C)    TempSrc:      SpO2:  95%    Weight: 49.5 kg      General exam: awake, appears fatigued and falls asleep easily, no acute distress, frail HEENT: Dry mucus membranes, hearing grossly normal  Respiratory system: On room air, normal respiratory effort. Cardiovascular system: Irregularly irregular no pedal edema.   Gastrointestinal system: soft, nontender nondistended abdomen Central nervous system: Exam limited by altered mental status.  Speech is dysarthric and low volume. Extremities: no edema, normal tone Skin: dry, intact, normal temperature, poor skin turgor Psychiatry: Anxious mood, congruent affect, unable to assess judgment and insight at this time  Data Reviewed:  Notable labs: Sodium 134, glucose 143, BUN 24, calcium 8.2 with albumin 2.7, alk phos 156, AST 67, total protein 5.9, CK4 81, WBC 22.6.  Procalcitonin 16.81.  Hemoglobin A1c 6.2%.  TSH  normal 1.133.  Blood cultures growing MRSA  Family Communication: None at bedside, will attempt to call  Disposition: Status is: Inpatient Remains inpatient appropriate  because: Severity of illness as outlined above   Planned Discharge Destination: Skilled nursing facility    Time spent: 55 minutes including time spent at bedside and in coordination of care  Author: Ezekiel Slocumb, DO 02/17/2022 5:50 PM  For on call review [email or web address].

## 2022-02-17 NOTE — Hospital Course (Addendum)
Kaylee Santiago is a 87 y.o. female with medical history significant of hypertension, atrial fibrillation on Xarelto, CVA in 10/2021,  bronchiectasis,hypothyroidism, and recent lumbar compression fractures who presented from [facility] on 02/22/2022 with altered mental status and severe low back pain with inability to move her legs.  See H&P for remainder of HPI on admission.  In the ED, patient met SIRS criteria with fever and leukocytosis.  Blood cultures subsequently positive for MRSA.  Patient on IV vancomycin.  Evaluation for infectious source is underway.  ID is following.  Neuroimaging revealed multiple new acute versus subacute embolic CVAs involving right posterior lateral frontal lobe and right occipital lobe, microhemorrhage noted in the right frontal lobe.  For back pain, CT lumbar spine showed acute compression fracture of T11 and progression of L3 compression fracture with 6 mm retropulsion with suspected moderate canal stenosis.    Consultants:  Neurology Neurosurgery Infectious disease Cardiology Palliative Care  Significant events: 8/9: A-fib RVR, still on permissive HTN, low dose IV metop. 8/10: Persistent A-fib RVR. Transfer to progressive. Cardizem drip.  Palliative care consult on 8/10, patient decided on comfort measures.  Patient had ongoing rapid A-fib and secondary hypotension and subsequently passed away.

## 2022-02-17 NOTE — Plan of Care (Signed)
  Problem: Safety: Goal: Ability to remain free from injury will improve Outcome: Progressing   

## 2022-02-17 NOTE — Assessment & Plan Note (Signed)
Antihypertensives on hold in the setting of acute strokes, allowing for permissive hypertension.

## 2022-02-17 NOTE — Consult Note (Signed)
Neurology Consultation Reason for Consult: Stroke Referring Physician: Katrinka Blazing, R  CC: "I think I had a stroke"  History is obtained from: Niece  HPI: Kaylee Santiago is a 87 y.o. female with a history of atrial fibrillation on Xarelto, hypertension, hypothyroidism who presents with confusion and pain.  She was seen to get agitated at her nursing home, and appeared more confused than typical and therefore was brought into the emergency department where an MRI was performed demonstrating multifocal embolic strokes.   LKW: Unclear tpa given?: no, anticoagulation  Past Medical History:  Diagnosis Date   Atrial fibrillation (HCC)    Atrial fibrillation (HCC)    Back pain    Dysrhythmia    Hypertension    Hypothyroidism    Stroke [facility])      Family History  Problem Relation Age of Onset   Breast cancer Sister 56   Breast cancer Sister        14's   CAD Mother      Social History:  reports that she has quit smoking. Her smoking use included cigarettes. She has never used smokeless tobacco. She reports that she does not currently use alcohol. She reports that she does not use drugs.   Exam: Current vital signs: BP (!) 152/84 (BP Location: Right Arm)   Pulse (!) 136   Temp 98.4 F (36.9 C)   Resp 14   Wt 49.5 kg   SpO2 95%   BMI 15.66 kg/m  Vital signs in last 24 hours: Temp:  [97.7 F (36.5 C)-99.7 F (37.6 C)] 98.4 F (36.9 C) (08/09 1119) Pulse Rate:  [74-136] 136 (08/09 1119) Resp:  [14-20] 14 (08/09 1133) BP: (117-156)/(74-103) 152/84 (08/09 1119) SpO2:  [84 %-97 %] 95 % (08/09 1119) Weight:  [49.5 kg] 49.5 kg (08/09 1256)   Physical Exam  Constitutional: Appears elderly  Neuro: Mental Status: Patient is lethargic but arousable, she is able to tell me her name, gives the month is December, when asked her the year she answers "December" Cranial Nerves: II: She sees fingers wiggling in the right but not left visual field. Pupils are equal, round, and reactive  to light.   III,IV, VI: EOMI without ptosis or diploplia.  V: Facial sensation is symmetric to temperature VII: Facial movement with some left facial weakness Motor: She has significant left arm weakness,  Sensory: Sensation is symmetric to light touch and temperature in the arms and legs. Cerebellar: She does not perform     I have reviewed labs in epic and the results pertinent to this consultation are: Creatinine 0.7 LDL is 39  I have reviewed the images obtained: MRI brain-multifocal embolic appearing infarcts in both hemispheres as well as cerebellum  Impression: 87 year old female who presents with multifocal emboli in the setting of atrial fibrillation anticoagulated with Xarelto.  She also has positive Staph aureus in two bottles.   Given the presence of small hemorrhagic appearing embolus, and propensity of septic emboli to.  This way, if the blood cultures are felt to be real, then she will need transesophageal echocardiogram to assess for vegetations.  Given the small hemorrhagic conversion, I would hold anticoagulation for the time being, can continue aspirin.  Recommendations: 1) aspirin 81 mg daily 2) would favor pursuing TEE if blood cultures are real. 3) PT, OT, ST   Ritta Slot, MD [facility] [phone]  If 7pm- 7am, please page neurology on call as listed in [facility]ION.

## 2022-02-17 NOTE — Assessment & Plan Note (Signed)
See intractable back pain 

## 2022-02-17 NOTE — Assessment & Plan Note (Signed)
Blood cultures from admission positive for MRSA.  Unclear source. -- Infectious disease consulted -- Continue vancomycin -- TTE pending, likely needs TEE -- Monitor fever curve, CBC, trend Pro-Cal -- Otherwise management as outlined

## 2022-02-17 NOTE — Evaluation (Signed)
Physical Therapy Evaluation Patient Details Name: Kaylee Santiago MRN: [id number] DOB: Aug 27, 1934 Today's Date: 02/17/2022  History of Present Illness  Pt is an 87 y.o. 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 Pt is currently admited for SIRS,  stroke and A-Fib.  Clinical Impression  Pt received in Bed breathing via mouth and difficulty talking but followed simple one step commands. Pt has severe back pain with movement. Pt HR remained between 117 to 142 BPM during the session despite Metoprolol and fentanyl. Pt donned the LSO with total assist in sitting. Pt performed log rolling with max of 1 and and bed mobility with max of 2. Pt performed STS and Bed<->chair transfers with Max of 2 with RW.  Pt has major drop in her functional mobility status as compared to 3 weeks prior to fracturing her L/S. Pt's [place] are involved and helpful with information and easy to reach. Pt will benefit of SNF after acute to return to PLOF safely.      Recommendations for follow up therapy are one component of a multi-disciplinary discharge planning process, led by the attending physician.  Recommendations may be updated based on patient status, additional functional criteria and insurance authorization.  Follow Up Recommendations Skilled nursing-short term rehab (<3 hours/day) Can patient physically be transported by private vehicle: No    Assistance Recommended at Discharge Frequent or constant Supervision/Assistance  Patient can return home with the following  Two people to help with walking and/or transfers;A lot of help with  bathing/dressing/bathroom;Assistance with cooking/housework;Assistance with feeding;Direct supervision/assist for medications management;Direct supervision/assist for financial management;Assist for transportation;Help with stairs or ramp for entrance    Equipment Recommendations None recommended by PT  Recommendations for Other Services       Functional Status Assessment Patient has had a recent decline in their functional status and demonstrates the ability to make significant improvements in function in a reasonable and predictable amount of time.     Precautions / Restrictions Precautions Precautions: Fall;Back Required Braces or Orthoses: Spinal Brace (LSO) Spinal Brace: Applied in sitting position (LSO) Restrictions Weight Bearing Restrictions: No      Mobility  Bed Mobility Overal bed mobility: Needs Assistance Bed Mobility: Rolling, Supine to Sit, Sit to Supine Rolling: Total assist   Supine to sit: Max assist, +2 for physical assistance Sit to supine: Max assist, +2 for physical assistance   General bed mobility comments: Pt in severe pain therfore difficult to move.    Transfers Overall transfer level: Needs assistance Equipment used: Rolling walker (2 wheels) Transfers: Sit to/from Stand, Bed to chair/wheelchair/BSC Sit to Stand: Max assist, +2 physical assistance   Step pivot transfers: Max assist, +2 physical assistance       General transfer comment: with LSO donned    Ambulation/Gait Ambulation/Gait assistance: Max assist, +2 physical assistance Gait Distance (Feet): 3 Feet Assistive device: Rolling walker (2 wheels) Gait Pattern/deviations: Step-to pattern, Antalgic, Shuffle Gait velocity: decreased        Stairs            Wheelchair Mobility    Modified Rankin (Stroke Patients Only)       Balance  Overall balance assessment: Needs assistance Sitting-balance support: Bilateral upper extremity supported, Feet supported Sitting  balance-Leahy Scale: Poor   Postural control: Posterior lean, Left lateral lean Standing balance support: Single extremity supported Standing balance-Leahy Scale: Poor Standing balance comment: forward and Left lateral lean with poor LUE control.                             Pertinent Vitals/Pain Pain Assessment Pain Assessment: 0-10 Pain Score: 10-Worst pain ever Breathing: occasional labored breathing, short period of hyperventilation Negative Vocalization: occasional moan/groan, low speech, negative/disapproving quality Facial Expression: facial grimacing    Home Living Family/patient expects to be discharged to:: Assisted living                 Home Equipment: Rollator (4 wheels);Toilet riser      Prior Function Prior Level of Function : Independent/Modified Independent (until fractured her L/S. Since then needed assistance and RW.)             Mobility Comments: Pt reports prior to initial stroke and lumbar fracture she was independent without AD, driving. transitioned to [facility], normally ambulatory without AD but since thee fracture, pt needed RW and assistance with ADLs and ambulation. ADLs Comments: ALF assists with meals, meds, and cleaning. pt able to perform ADLs with limited assist from staff     Hand Dominance        Extremity/Trunk Assessment   Upper Extremity Assessment Upper Extremity Assessment: LUE deficits/detail LUE Deficits / Details: Flacid LUE Sensation: decreased light touch LUE Coordination: decreased fine motor;decreased gross motor    Lower Extremity Assessment Lower Extremity Assessment: Generalized weakness (L >R)    Cervical / Trunk Assessment Cervical / Trunk Assessment: Kyphotic (forward flexed.)  Communication   Communication: Expressive difficulties (slow processing.)  Cognition Arousal/Alertness: Lethargic Behavior During Therapy: Flat affect Overall Cognitive Status: Impaired/Different from  baseline Area of Impairment: Attention, Orientation, Awareness, Problem solving                 Orientation Level: Place ([place])                      General Comments      Exercises     Assessment/Plan    PT Assessment Patient needs continued PT services  PT Problem List Decreased strength;Decreased range of motion;Decreased activity tolerance;Decreased balance;Decreased mobility;Decreased coordination;Decreased cognition;Decreased knowledge of use of DME;Decreased safety awareness;Decreased knowledge of precautions;Cardiopulmonary status limiting activity;Impaired sensation;Impaired tone;Pain       PT Treatment Interventions Gait training;Functional mobility training;Therapeutic activities;Therapeutic exercise;Balance training;Neuromuscular re-education;Cognitive remediation;Patient/family education;Wheelchair mobility training    PT Goals (Current goals can be found in the Care Plan section)  Acute Rehab PT Goals Patient Stated Goal: Unable ot particiapte in goal setting. PT Goal Formulation: Patient unable to participate in goal setting Potential to Achieve Goals: Fair (guarded)    Frequency 7X/week     Co-evaluation               AM-PAC PT "6 Clicks" Mobility  Outcome Measure Help needed turning from your back to your side while in a flat bed without using bedrails?: A Lot Help needed moving from lying on your back to sitting on the side of a flat bed without using bedrails?: A Lot Help needed moving to and from a bed to a chair (including a wheelchair)?: Total Help needed standing up from a chair using your arms (e.g., wheelchair or  bedside chair)?: Total Help needed to walk in hospital room?: Total Help needed climbing 3-5 steps with a railing? : Total 6 Click Score: 8    End of Session Equipment Utilized During Treatment: Gait belt;Back brace Activity Tolerance: Patient limited by fatigue;Patient limited by lethargy;Patient limited by  pain;Treatment limited secondary to medical complications (Comment) Patient left: in bed;with call bell/phone within reach;with bed alarm set Nurse Communication: Mobility status (nurse present through out the evaluation.) PT Visit Diagnosis: Unsteadiness on feet (R26.81);Other abnormalities of gait and mobility (R26.89);Muscle weakness (generalized) (M62.81);Pain;Hemiplegia and hemiparesis;Difficulty in walking, not elsewhere classified (R26.2) Hemiplegia - Right/Left: Left Hemiplegia - dominant/non-dominant:  (not assessed) Hemiplegia - caused by: Other cerebrovascular disease (clots 2/2 A-Fib) Pain - part of body:  (back)    Time: 1856-3149 PT Time Calculation (min) (ACUTE ONLY): 27 min   Charges:   PT Evaluation $PT Eval High Complexity: 1 High PT Treatments $Therapeutic Activity: 23-37 mins        Porter Nakama PT DPT 2:19 PM,02/17/22

## 2022-02-17 NOTE — Assessment & Plan Note (Signed)
Mild, sodium on admission 134 appears chronic.  Most likely hypovolemic given elevated BUN/Cr ratio, elevated CK, poor skin turgor on exam noted.   -- Continue IV hydration until adequate p.o. intake -- Monitor BMP

## 2022-02-17 NOTE — Assessment & Plan Note (Signed)
Patient takes Xarelto for A-fib, on hold due to microhemorrhage seen in the right frontal lobe on MRI.

## 2022-02-17 NOTE — Assessment & Plan Note (Addendum)
Secondary to acute compression fracture of T11, recent compression fractures of L1, L2, L3, L5, seen by neurosurgery on 7/27.  Conservative management was recommended including TLSO/LSO brace. -- Brace when out of bed -- Pain management per orders -- PT OT -- Neurosurgery recommends upright lumbar x-rays when patient able to tolerate.  No plan for neurosurgical intervention at this time.

## 2022-02-17 NOTE — Progress Notes (Signed)
SLP Follow Up Note  Patient Details Name: Kaylee Santiago MRN: [id number] DOB: 06-16-1935   Cancelled treatment:       Reason Eval/Treat Not Completed: Other (comment)  Pt unable to tolerate having HOB raised d/t severe back pain. I understand that back brace has been ordered with PT/OT to attempt. ST will re-attempt bedside swallow after pt has back brace in place.   Bijon Mineer B. Dreama Saa, M.S., CCC-SLP, CBIS Speech-Language Pathologist Certified Brain Injury Specialist [facility]  [facility] [phone] Ascom [phone] Fax [phone]  Kaylee Santiago 02/17/2022, 12:38 PM

## 2022-02-17 NOTE — Assessment & Plan Note (Addendum)
Most likely multifactorial due to acute embolic strokes and infection. -- Manage underlying conditions as outlined -- Delirium precautions -- Neurochecks

## 2022-02-17 NOTE — Plan of Care (Signed)
Pt hasn't voided all night.  We did a bladder scan and it showed 523 this morning. Pt is + for stroke and appears flacid on L side.  She has severe pain r/t compression fxs. We didn't feel comfortable getting pt up until evaluated by PT.  Reached out to on call NP - no new orders.

## 2022-02-17 NOTE — Assessment & Plan Note (Signed)
Stable without exacerbation at this time. -- Incentive spirometry -- As needed bronchodilators, mucolytics

## 2022-02-17 NOTE — Consult Note (Addendum)
NAME: Kaylee Santiago  DOB: [birth year]  MRN: [id number]  Date/Time: 02/17/2022 11:49 AM  REQUESTING PROVIDER: Dr. Arbutus Ped Subjective:  REASON FOR CONSULT: MRSA bacteremia ?Not much history available from patient as she is very weak and lethargic Jori T Fiala is a 87 y.o. 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 She used to live at home but now for the past few months is in SNF. She has deteriorated over the past few months CT abdomen pelvis done and that showed Blood cultures were sent and that was positive for MRSA 4 out of 4 and I am seeing the patient for the  same.   02/22/2022  BP 155/93 (H)  Temp 98.8 F (37.1 C)  Pulse Rate 74  Resp 20  SpO2 84 % (L)    Latest Reference Range & Units   WBC 4.0 - 10.5 K/uL 22.7 (H)  Hemoglobin 12.0 - 15.0 g/dL 11.8 (L)  HCT 36.0 - 46.0 % 35.7 (L)  Platelets 150 - 400 K/uL 218  Creatinine 0.44 - 1.00 mg/dL 0.86   ' CT abdomen pelvis done and that showed MRI showed multiple small cerebral and cerebellar infarcts Of subacute or acute in nature compatible with cerebral emboli.  Atrophy and chronic right MCA infarct.  Chronic microvascular ischemic changes throughout the white matter. MRI of the lumbar spine without contrast showed subacute appearing L3 compression fracture with 30 to 35% loss of vertebral body Stable chronic L1-L2 and L5 compression fracture.  No paraspinal fluid collection.  Mild to mostly lateral paraspinal edema centered at the L2-L3 level.  Patient initially was started on broad-spectrum antibiotic Blood cultures were sent and that was positive for MRSA 4 out of 4 .I am seeing the patient for the MRSA  bacteremia  Past Medical History:  Diagnosis Date   Atrial fibrillation ([place])    Atrial fibrillation ([place])    Back pain    Dysrhythmia    Hypertension    Hypothyroidism    Stroke [facility])     Past Surgical History:  Procedure Laterality Date   ABDOMINAL HYSTERECTOMY     BREAST EXCISIONAL BIOPSY Right 70's   CATARACT EXTRACTION W/ INTRAOCULAR LENS  IMPLANT, BILATERAL     KYPHOPLASTY N/A 12/21/2018   Procedure: KYPHOPLASTY L1;  Surgeon: Hessie Knows, MD;  Location: [facility] ORS;  Service: Orthopedics;  Laterality: N/A;    Social History   Socioeconomic History   Marital status: Widowed    Spouse name: Not on file   Number of children: Not on file   Years of education: Not on file   Highest education level: Not on file  Occupational History   Not on file  Tobacco Use   Smoking status: Former    Types: Cigarettes   Smokeless tobacco: Never  Vaping Use   Vaping Use: Never used  Substance and Sexual Activity   Alcohol use: Not Currently   Drug use: Never   Sexual activity: Not on file  Other Topics Concern   Not on file  Social History Narrative   Lives at home independently.  Ambulates steadily.   Social Determinants of Health   Financial Resource Strain: Not on file  Food Insecurity: Not on file  Transportation Needs: Not  on file  Physical Activity: Not on file  Stress: Not on file  Social Connections: Not on file  Intimate Partner Violence: Not on file    Family History  Problem Relation Age of Onset   Breast cancer Sister 37   Breast cancer Sister        43's   CAD Mother    Allergies  Allergen Reactions   Amoxicillin-Pot Clavulanate Diarrhea   Levofloxacin Diarrhea   Buspirone Other (See Comments)   Trazodone Other (See Comments)    nightmares   I? Current Facility-Administered Medications  Medication Dose Route Frequency Provider Last Rate Last Admin   0.9 %  sodium chloride infusion   Intravenous Continuous Fuller Plan A, MD 75 mL/hr at 02/17/22  1039 Infusion Verify at 02/17/22 1039   acetaminophen (TYLENOL) tablet 650 mg  650 mg Oral Q6H PRN Norval Morton, MD       Or   acetaminophen (TYLENOL) suppository 650 mg  650 mg Rectal Q6H PRN Fuller Plan A, MD       albuterol (PROVENTIL) (2.5 MG/3ML) 0.083% nebulizer solution 2.5 mg  2.5 mg Nebulization Q6H PRN Norval Morton, MD       aspirin EC tablet 81 mg  81 mg Oral Daily Fuller Plan A, MD   81 mg at 03/07/2022 2008   atorvastatin (LIPITOR) tablet 40 mg  40 mg Per Tube Daily Fuller Plan A, MD   40 mg at 02/24/2022 [id number]   bismuth subsalicylate (PEPTO BISMOL) 262 MG/15ML suspension 15 mL  15 mL Oral Q6H PRN Fuller Plan A, MD       fentaNYL (SUBLIMAZE) injection 25 mcg  25 mcg Intravenous Q2H PRN Fuller Plan A, MD   25 mcg at 02/17/22 1029   levothyroxine (SYNTHROID) tablet 75 mcg  75 mcg Oral Daily Fuller Plan A, MD   75 mcg at 02/17/22 0452   LORazepam (ATIVAN) tablet 0.5 mg  0.5 mg Oral QHS PRN Fuller Plan A, MD   0.5 mg at  2008   oxyCODONE (Oxy IR/ROXICODONE) immediate release tablet 5 mg  5 mg Oral Q6H PRN Fuller Plan A, MD       psyllium (HYDROCIL/METAMUCIL) 1 packet  1 packet Oral BID Fuller Plan A, MD       saccharomyces boulardii (FLORASTOR) capsule 250 mg  250 mg Oral Daily Smith, Rondell A, MD       sodium chloride flush (NS) 0.9 % injection 3 mL  3 mL Intravenous Q12H Smith, Rondell A, MD       vancomycin (VANCOREADY) IVPB 750 mg/150 mL  750 mg Intravenous Q24H Nazari, Walid A, RPH         Abtx:  Anti-infectives (From admission, onward)    Start     Dose/Rate Route Frequency Ordered Stop   02/17/22 1400  vancomycin (VANCOREADY) IVPB 750 mg/150 mL        750 mg 150 mL/hr over 60 Minutes Intravenous Every 24 hours 02/15/2022 1858     02/27/2022 2200  metroNIDAZOLE (FLAGYL) IVPB 500 mg  Status:  Discontinued        500 mg 100 mL/hr over 60 Minutes Intravenous Every 12 hours  1804 03/02/2022 2256   02/11/2022 2200  aztreonam (AZACTAM) 2 g  in sodium chloride 0.9 % 100 mL IVPB  Status:  Discontinued        2 g 200 mL/hr over 30 Minutes Intravenous Every 8 hours 02/21/2022 1804  2256    1245  aztreonam (AZACTAM)  2 g in sodium chloride 0.9 % 100 mL IVPB        2 g 200 mL/hr over 30 Minutes Intravenous  Once 02/23/2022 1231 02/21/2022 1259   03/09/2022 1245  metroNIDAZOLE (FLAGYL) IVPB 500 mg        500 mg 100 mL/hr over 60 Minutes Intravenous  Once 03/08/2022 1231 03/03/2022 1401   03/06/2022 1245  vancomycin (VANCOCIN) IVPB 1000 mg/200 mL premix        1,000 mg 200 mL/hr over 60 Minutes Intravenous  Once 02/10/2022 1231 03/01/2022 1512       REVIEW OF SYSTEMS:  NA Due to hr weakness and lethargy Objective:  VITALS:  BP (!) 152/84 (BP Location: Right Arm)   Pulse (!) 136   Temp 98.4 F (36.9 C)   Resp 14   SpO2 95%   PHYSICAL EXAM:  General: lethargic, eyes closed opens to calling and speaks softly- says she needs water emaciated Head: Normocephalic, without obvious abnormality, atraumatic. Eyes: Conjunctivae clear, anicteric sclerae. Pupils are equal ENT Nares normal. No drainage or sinus tenderness. Dry oral mucosa Neck:  symmetrical, no adenopathy, thyroid: non tender no carotid bruit and no JVD. Back: sacral decubitus of 2 cm Whitish base Lungs: b/l air entry Heart: atrial fibrillation Abdomen: Soft, PEG in place Extremities: atraumatic, no cyanosis. No edema. No clubbing Skin: No rashes or lesions. Or bruising Lymph: Cervical, supraclavicular normal. Neurologic: cannot assess Pertinent Labs Lab Results CBC    Component Value Date/Time   WBC 22.6 (H) 02/17/2022 0400   RBC 3.97 02/17/2022 0400   HGB 12.0 02/17/2022 0400   HCT 36.1 02/17/2022 0400   PLT 202 02/17/2022 0400   MCV 90.9 02/17/2022 0400   MCH 30.2 02/17/2022 0400   MCHC 33.2 02/17/2022 0400   RDW 13.7 02/17/2022 0400   LYMPHSABS 0.4 (L) 01/16/2022 1940   MONOABS 0.7 01/16/2022 1940   EOSABS 0.0 01/16/2022 1940   BASOSABS 0.0  01/16/2022 1940       Latest Ref Rng & Units 02/17/2022    4:00 AM 02/22/2022   11:36 AM 01/18/2022    9:22 AM  CMP  Glucose 70 - 99 mg/dL 952  841  324   BUN 8 - 23 mg/dL 24  25  19    Creatinine 0.44 - 1.00 mg/dL  4.01  0.27   Sodium 135 - 145 mmol/L 134  134  131   Potassium 3.5 - 5.1 mmol/L 4.1  4.6  4.1   Chloride 98 - 111 mmol/L 100  96  88   CO2 22 - 32 mmol/L 25  27  32   Calcium 8.9 - 10.3 mg/dL 8.2  8.8  9.9   Total Protein 6.5 - 8.1 g/dL 5.9  6.5    Total Bilirubin 0.3 - 1.2 mg/dL 1.0  0.9    Alkaline Phos 38 - 126 U/L 156  171    AST 15 - 41 U/L 67  77    ALT 0 - 44 U/L 44  46        Microbiology: Recent Results (from the past 240 hour(s))  Resp Panel by RT-PCR (Flu A&B, Covid) Anterior Nasal Swab     Status: None   Collection Time: 03/10/2022 11:36 AM   Specimen: Anterior Nasal Swab  Result Value Ref Range Status   SARS Coronavirus 2 by RT PCR NEGATIVE NEGATIVE Final    Comment: (NOTE) SARS-CoV-2 target nucleic acids are NOT DETECTED.  The SARS-CoV-2 RNA is generally detectable in  upper respiratory specimens during the acute phase of infection. The lowest concentration of SARS-CoV-2 viral copies this assay can detect is 138 copies/mL. A negative result does not preclude SARS-Cov-2 infection and should not be used as the sole basis for treatment or other patient management decisions. A negative result may occur with  improper specimen collection/handling, submission of specimen other than nasopharyngeal swab, presence of viral mutation(s) within the areas targeted by this assay, and inadequate number of viral copies(<138 copies/mL). A negative result must be combined with clinical observations, patient history, and epidemiological information. The expected result is Negative.  Fact Sheet for Patients:  EntrepreneurPulse.com.au  Fact Sheet for Healthcare Providers:  IncredibleEmployment.be  This test is no t yet  approved or cleared by the [place] FDA and  has been authorized for detection and/or diagnosis of SARS-CoV-2 by FDA under an Emergency Use Authorization (EUA). This EUA will remain  in effect (meaning this test can be used) for the duration of the COVID-19 declaration under Section 564(b)(1) of the Act, 21 U.S.C.section 360bbb-3(b)(1), unless the authorization is terminated  or revoked sooner.       Influenza A by PCR NEGATIVE NEGATIVE Final   Influenza B by PCR NEGATIVE NEGATIVE Final    Comment: (NOTE) The Xpert Xpress SARS-CoV-2/FLU/RSV plus assay is intended as an aid in the diagnosis of influenza from Nasopharyngeal swab specimens and should not be used as a sole basis for treatment. Nasal washings and aspirates are unacceptable for Xpert Xpress SARS-CoV-2/FLU/RSV testing.  Fact Sheet for Patients: EntrepreneurPulse.com.au  Fact Sheet for Healthcare Providers: IncredibleEmployment.be  This test is not yet approved or cleared by the [place] FDA and has been authorized for detection and/or diagnosis of SARS-CoV-2 by FDA under an Emergency Use Authorization (EUA). This EUA will remain in effect (meaning this test can be used) for the duration of the COVID-19 declaration under Section 564(b)(1) of the Act, 21 U.S.C. section 360bbb-3(b)(1), unless the authorization is terminated or revoked.  Performed at [facility], [address] [place], [place] [address]   Blood Culture (routine x 2)     Status: Abnormal (Preliminary result)   Collection Time: 02/09/2022 12:45 PM   Specimen: BLOOD  Result Value Ref Range Status   Specimen Description   Final    BLOOD BLOOD RIGHT FOREARM Performed at [facility], [address], [place], [place] [address]    Special Requests   Final    BOTTLES DRAWN AEROBIC AND ANAEROBIC Blood Culture adequate volume Performed at [facility], [address]  [place], [place] [address]    Culture  Setup Time   Final    GRAM POSITIVE COCCI IN BOTH AEROBIC AND ANAEROBIC BOTTLES CRITICAL RESULT CALLED TO, READ BACK BY AND VERIFIED WITH: NATHAN BELUE AT 2208 ON 02/09/2022 BY SS    Culture (A)  Final    STAPHYLOCOCCUS AUREUS SUSCEPTIBILITIES TO FOLLOW Performed at [facility], [address], [place], [place] [address]    Report Status PENDING  Incomplete  Blood Culture ID Panel (Reflexed)     Status: Abnormal   Collection Time: 02/11/2022 12:45 PM  Result Value Ref Range Status   Enterococcus faecalis NOT DETECTED NOT DETECTED Final   Enterococcus Faecium NOT DETECTED NOT DETECTED Final   Listeria monocytogenes NOT DETECTED NOT DETECTED Final   Staphylococcus species DETECTED (A) NOT DETECTED Final    Comment: CRITICAL RESULT CALLED TO, READ BACK BY AND VERIFIED WITH: NATHAN BELUE AT 2208 ON 03/11/2022  BY SS    Staphylococcus aureus (BCID) DETECTED (A) NOT DETECTED Final    Comment: Methicillin (oxacillin)-resistant Staphylococcus aureus (MRSA). MRSA is predictably resistant to beta-lactam antibiotics (except ceftaroline). Preferred therapy is vancomycin unless clinically contraindicated. Patient requires contact precautions if  hospitalized. CRITICAL RESULT CALLED TO, READ BACK BY AND VERIFIED WITH: NATHAN BELUE AT 2208 ON 2022-03-13 BY SS    Staphylococcus epidermidis NOT DETECTED NOT DETECTED Final   Staphylococcus lugdunensis NOT DETECTED NOT DETECTED Final   Streptococcus species NOT DETECTED NOT DETECTED Final   Streptococcus agalactiae NOT DETECTED NOT DETECTED Final   Streptococcus pneumoniae NOT DETECTED NOT DETECTED Final   Streptococcus pyogenes NOT DETECTED NOT DETECTED Final   A.calcoaceticus-baumannii NOT DETECTED NOT DETECTED Final   Bacteroides fragilis NOT DETECTED NOT DETECTED Final   Enterobacterales NOT DETECTED NOT DETECTED Final   Enterobacter cloacae complex NOT DETECTED NOT DETECTED Final   Escherichia coli NOT DETECTED NOT  DETECTED Final   Klebsiella aerogenes NOT DETECTED NOT DETECTED Final   Klebsiella oxytoca NOT DETECTED NOT DETECTED Final   Klebsiella pneumoniae NOT DETECTED NOT DETECTED Final   Proteus species NOT DETECTED NOT DETECTED Final   Salmonella species NOT DETECTED NOT DETECTED Final   Serratia marcescens NOT DETECTED NOT DETECTED Final   Haemophilus influenzae NOT DETECTED NOT DETECTED Final   Neisseria meningitidis NOT DETECTED NOT DETECTED Final   Pseudomonas aeruginosa NOT DETECTED NOT DETECTED Final   Stenotrophomonas maltophilia NOT DETECTED NOT DETECTED Final   Candida albicans NOT DETECTED NOT DETECTED Final   Candida auris NOT DETECTED NOT DETECTED Final   Candida glabrata NOT DETECTED NOT DETECTED Final   Candida krusei NOT DETECTED NOT DETECTED Final   Candida parapsilosis NOT DETECTED NOT DETECTED Final   Candida tropicalis NOT DETECTED NOT DETECTED Final   Cryptococcus neoformans/gattii NOT DETECTED NOT DETECTED Final   Meth resistant mecA/C and MREJ DETECTED (A) NOT DETECTED Final    Comment: CRITICAL RESULT CALLED TO, READ BACK BY AND VERIFIED WITH: NATHAN BELUE AT 2208 ON March 13, 2022 BY SS Performed at [facility], [address] [place], [place] [address]   Blood Culture (routine x 2)     Status: Abnormal (Preliminary result)   Collection Time: 03-13-2022 12:47 PM   Specimen: BLOOD  Result Value Ref Range Status   Specimen Description   Final    BLOOD LEFT ANTECUBITAL Performed at [facility], [address] [place], [place] [address]    Special Requests   Final    BOTTLES DRAWN AEROBIC AND ANAEROBIC Blood Culture adequate volume Performed at [facility], [address] [place], [place] [address]    Culture  Setup Time   Final    GRAM POSITIVE COCCI IN BOTH AEROBIC AND ANAEROBIC BOTTLES CRITICAL VALUE NOTED.  VALUE IS CONSISTENT WITH PREVIOUSLY REPORTED AND CALLED VALUE.    Culture STAPHYLOCOCCUS AUREUS (A)  Final   Report Status  PENDING  Incomplete    IMAGING RESULTS: Multiple small cerebral and cerebellar infarcts Developing encephalomalacia  Chronic bibasilar opacities secondary to bronchiectasis I have personally reviewed the films ? Impression/Recommendation MRSA bacteremia Small wound on the sacrum- culture taken  has chronic back pain and MRI was done without contrast and that seems to be okay except for compression fractures which are multiple She needs a 2D echo.  Repeat blood culture Currently on vancomycin  May change to daptomycin  Encephalopathy with underlying CVA / compounded by sepsis   CVA subacute to acute infarcts both in cerebral  and cerebellar areas suggestive of embolic manifestation Has Afib which is a risk factor . Also if vegetation then could be a risk- but the Afib  is more likely to cause multiple emboli  Dysphagia- has PEG ? A-fib on Xarelto  Hypothyroidism on Synthroid  Hypertension-  ___________________________________________________ Discussed with her nurse and care team Note:  This document was prepared using Dragon voice recognition software and may include unintentional dictation errors.

## 2022-02-17 NOTE — Assessment & Plan Note (Signed)
Related to chronic illnesses (COPD, pulmonary fibrosis, multiple CVAs, dysphagia), as evidenced by severe fat and muscle depletion. --Appreciate dietitian recommendations -- Started on Nepro shakes, Magic cups, vitamin C and multivitamin -- Monitor and encourage p.o. intake -- Patient previously had PEG tube after prior stroke which was removed at her request and patient has stated she would not want any feeding tube or artificial nutrition

## 2022-02-17 NOTE — Assessment & Plan Note (Signed)
See intractable migraine

## 2022-02-17 NOTE — Evaluation (Signed)
Occupational Therapy Evaluation Patient Details Name: Kaylee Santiago MRN: [id number] DOB: 10-26-1934 Today's Date: 02/17/2022   History of Present Illness Pt is an 87 year old female presenting to ED with increased confusion from [facility]. Admitted with acute CVA-MRI confirmed multiple areas of acute or subacute infarct compatible with cerebral emboli with microhemorrhage in the right frontal lobe, ?new T11 compression fracture, SIRS, hyponatremia; PMH significant for hypertension, atrial fibrillation on Xarelto, CVA in 10/2021,  bronchiectasis,hypothyroidism, and compression fractures   Clinical Impression   Chart reviewed, RN cleared pt for participation in OT evaluation. Pt is oriented to [place] and place. Pt able to hold short conversations with this therapist with increased time for processing. Pt is severely pain limited. TOTAL A +2 required for rolling. MAX A required for all ADL tasks. PTA pt endorses she was MOD I in many ADLs at ALF. Pt reports "I need to figure out what I really want" when rehab suggested. LUE with spasticity throughout, apparent sensation deficits. Pt also presents with praxis deficits affecting optimal ADL completion. Formal vision assessment limited, OT will continue to assess. Recommend STR at this time to address functional deficits.   Of note: following formal OT eval, speech present to perform bedside swallow eval. After completion, pt requested additional liquids. MAX A required from therapist for nectar thick (per speech recommendations) liquids via spoon 5 spoon fulls with appropriate positioning however pt with wet cough following 5th presentation of liquids. Two chin tucks completed with no further wet cough noted. SLP notified via secure chat. RN and MD present.      Recommendations for follow up therapy are one component of a multi-disciplinary discharge planning process, led by the attending physician.  Recommendations may be updated based on patient status,  additional functional criteria and insurance authorization.   Follow Up Recommendations  Skilled nursing-short term rehab (<3 hours/day)    Assistance Recommended at Discharge Frequent or constant Supervision/Assistance  Patient can return home with the following Two people to help with walking and/or transfers;Two people to help with bathing/dressing/bathroom    Functional Status Assessment  Patient has had a recent decline in their functional status and demonstrates the ability to make significant improvements in function in a reasonable and predictable amount of time.  Equipment Recommendations  Other (comment) (per next venue of care)    Recommendations for Other Services       Precautions / Restrictions Precautions Precautions: Fall;Back Required Braces or Orthoses: Spinal Brace Spinal Brace: Applied in sitting position (LSO) Restrictions Weight Bearing Restrictions: No      Mobility Bed Mobility Overal bed mobility: Needs Assistance Bed Mobility: Rolling Rolling: Total assist, +2 for physical assistance         General bed mobility comments: unable to tolerate additional OOB on this date    Transfers Overall transfer level: Needs assistance                        Balance                                           ADL either performed or assessed with clinical judgement   ADL Overall ADL's : Needs assistance/impaired Eating/Feeding: Sitting;Maximal assistance Eating/Feeding Details (indicate cue type and reason): frequent vcs for chin tucks Grooming: Cueing for sequencing;Maximal assistance;Cueing for safety  Upper Body Dressing : Maximal assistance Upper Body Dressing Details (indicate cue type and reason): anticipated Lower Body Dressing: Maximal assistance Lower Body Dressing Details (indicate cue type and reason): anticipated                     Vision   Additional Comments: pt unable to participate  in functional vision assessment/screen at this time; able to track therapist throughout room inconsistently; will continue to assess     Perception     Praxis Praxis Praxis: Impaired Praxis Impairment Details: Initiation;Ideomotor;Motor planning    Pertinent Vitals/Pain Pain Assessment Pain Assessment: Faces Faces Pain Scale: Hurts whole lot Pain Location: back Pain Descriptors / Indicators: Grimacing, Moaning Pain Intervention(s): Limited activity within patient's tolerance, Monitored during session, Repositioned, Other (comment) (RN to provide timed pain meds at end of session)     Hand Dominance     Extremity/Trunk Assessment Upper Extremity Assessment Upper Extremity Assessment: LUE deficits/detail;Difficult to assess due to impaired cognition LUE Deficits / Details: spasticity throughout LUE: MAS in shoulder flexion/extension: 1, elbow: 1, wrist: 1 LUE: Shoulder pain with ROM LUE Sensation: decreased light touch LUE Coordination: decreased fine motor;decreased gross motor   Lower Extremity Assessment Lower Extremity Assessment: Generalized weakness;Difficult to assess due to impaired cognition   Cervical / Trunk Assessment Cervical / Trunk Assessment: Kyphotic (forward flexed.)   Communication Communication Communication: Expressive difficulties   Cognition Arousal/Alertness: Lethargic Behavior During Therapy: Flat affect Overall Cognitive Status: Impaired/Different from baseline Area of Impairment: Orientation, Attention, Memory, Following commands, Safety/judgement, Problem solving                 Orientation Level: Disoriented to, Time, Situation Current Attention Level: Focused   Following Commands: Follows one step commands inconsistently Safety/Judgement: Decreased awareness of safety, Decreased awareness of deficits   Problem Solving: Slow processing, Decreased initiation, Difficulty sequencing, Requires verbal cues, Requires tactile cues        General Comments  Pt HR up to 140s while rolling in bed    Exercises     Shoulder Instructions      Home Living Family/patient expects to be discharged to:: Assisted living                             Home Equipment: Rollator (4 wheels);Toilet riser   Additional Comments: per chart review      Prior Functioning/Environment Prior Level of Function : Needs assist             Mobility Comments: amb without AD at ALF per chart, however use of RW the last month due to back pain/compression fractures ADLs Comments: Pt able to perform ADL with intermittent assist per chart review, staff assists with IADLs        OT Problem List: Decreased strength;Decreased range of motion;Impaired balance (sitting and/or standing);Decreased knowledge of use of DME or AE;Decreased safety awareness;Impaired sensation      OT Treatment/Interventions: Brandes-care/ADL training;Patient/family education;Therapeutic exercise;Therapeutic activities;DME and/or AE instruction    OT Goals(Current goals can be found in the care plan section) Acute Rehab OT Goals Patient Stated Goal: decrease pain OT Goal Formulation: With patient Time For Goal Achievement: 03/03/22 Potential to Achieve Goals: Good ADL Goals Pt Will Perform Grooming: with min assist;sitting Pt Will Perform Upper Body Dressing: with min assist;sitting Pt Will Perform Lower Body Dressing: with mod assist;sitting/lateral leans;with adaptive equipment Pt Will Transfer to Toilet: with mod assist;stand pivot transfer Pt Will  Perform Toileting - Clothing Manipulation and hygiene: with mod assist;sitting/lateral leans  OT Frequency: Min 2X/week    Co-evaluation              AM-PAC OT "6 Clicks" Daily Activity     Outcome Measure Help from another person eating meals?: Total Help from another person taking care of personal grooming?: Total Help from another person toileting, which includes using toliet, bedpan, or urinal?:  Total Help from another person bathing (including washing, rinsing, drying)?: Total Help from another person to put on and taking off regular upper body clothing?: Total Help from another person to put on and taking off regular lower body clothing?: Total 6 Click Score: 6   End of Session Nurse Communication: Mobility status  Activity Tolerance: Patient limited by lethargy;Patient limited by pain Patient left: in bed;with call bell/phone within reach;with bed alarm set;with nursing/sitter in room  OT Visit Diagnosis: Unsteadiness on feet (R26.81)                Time: 1405-1430 OT Time Calculation (min): 25 min Charges:  OT General Charges $OT Visit: 1 Visit OT Evaluation $OT Eval Moderate Complexity: 1 Mod  Oleta Mouse, OTD OTR/L  02/17/22, 3:19 PM

## 2022-02-17 NOTE — Assessment & Plan Note (Signed)
CK 481 consistent with mild rhabdomyolysis. Resolved with IV hydration.

## 2022-02-17 NOTE — Assessment & Plan Note (Signed)
Most likely due to sepsis.  Monitor LFTs.

## 2022-02-17 NOTE — Progress Notes (Signed)
   02/17/22 0431  Assess: MEWS Score  Temp 97.7 F (36.5 C)  BP 134/85  MAP (mmHg) 101  Pulse Rate (!) 115  Resp 18  SpO2 93 %  Assess: MEWS Score  MEWS Temp 0  MEWS Systolic 0  MEWS Pulse 2  MEWS RR 0  MEWS LOC 0  MEWS Score 2  MEWS Score Color Yellow  Assess: if the MEWS score is Yellow or Red  Were vital signs taken at a resting state? Yes  Focused Assessment No change from prior assessment  Does the patient meet 2 or more of the SIRS criteria? Yes  Does the patient have a confirmed or suspected source of infection? Yes  Provider and Rapid Response Notified? Yes  MEWS guidelines implemented *See Row Information* Yes  Treat  MEWS Interventions Administered scheduled meds/treatments  Take Vital Signs  Increase Vital Sign Frequency  Yellow: Q 2hr X 2 then Q 4hr X 2, if remains yellow, continue Q 4hrs  Escalate  MEWS: Escalate Yellow: discuss with charge nurse/RN and consider discussing with provider and RRT  Notify: Charge Nurse/RN  Name of Charge Nurse/RN Notified Jackie, RN  Date Charge Nurse/RN Notified 02/17/22  Time Charge Nurse/RN Notified 0440  Notify: Provider  Provider Name/Title Bishop Limbo, NP  Date Provider Notified 02/17/22  Time Provider Notified 0400  Method of Notification Call (secure chat)  Notification Reason Other (Comment) (HR in the 130s-140s)  Provider response See new orders  Date of Provider Response 02/17/22  Time of Provider Response [phone]  Document  Progress note created (see row info) Yes  Assess: SIRS CRITERIA  SIRS Temperature  0  SIRS Pulse 1  SIRS Respirations  0  SIRS WBC 1  SIRS Score Sum  2   Adm IV metoprolol 5 mg - 2nd dose

## 2022-02-17 NOTE — Evaluation (Signed)
Clinical/Bedside Swallow Evaluation Patient Details  Name: Kaylee Santiago MRN: [id number] Date of Birth: 05-28-1935  Today's Date: 02/17/2022 Time: SLP Start Time (ACUTE ONLY): 1425 SLP Stop Time (ACUTE ONLY): 1435 SLP Time Calculation (min) (ACUTE ONLY): 10 min  Past Medical History:  Past Medical History:  Diagnosis Date   Atrial fibrillation (HCC)    Atrial fibrillation (HCC)    Back pain    Dysrhythmia    Hypertension    Hypothyroidism    Stroke [facility])    Past Surgical History:  Past Surgical History:  Procedure Laterality Date   ABDOMINAL HYSTERECTOMY     BREAST EXCISIONAL BIOPSY Right 70's   CATARACT EXTRACTION W/ INTRAOCULAR LENS  IMPLANT, BILATERAL     KYPHOPLASTY N/A 12/21/2018   Procedure: KYPHOPLASTY L1;  Surgeon: Kennedy Bucker, MD;  Location: [facility] ORS;  Service: Orthopedics;  Laterality: N/A;   HPI:  Kaylee Santiago is a 87 y.o. female with a past medical history of atrial fibrillation, hypertension, CVA, presents to the emergency department from her nursing facility for altered mental status.  Per EMS patient is more confused than typical.  They state facility reported chronic back pain but patient has been complaining more about her back recently.  MRI revealed Multiple small areas of acute or subacute infarct compatible with cerebral emboli.    Microhemorrhage right frontal lobe is hyperdense on CT and likely is acute or subacute.    Atrophy and chronic right MCA infarcts. Chronic microvascular ischemic change throughout the white matter. Chest x-ray revealed Chronic bibasilar opacities, left greater than right, likely due to bronchiectasis. No new focal consolidation.    Assessment / Plan / Recommendation  Clinical Impression  Pt presents with s/s concerning for reduced airway protection when consuming trials of ice chips, thin liquids via spoon, necatr thick liquids via spoon and puree. Pt's s/s are significantly reduced when consuming trials of necatr thick via spoon and  puree. Will place an order for dysphagia 1 with necatr thick liquids via spoon but would rather keep medicines via IV for present. This diet is indented to allow pt trials of liquid and food. If pt should begin to exhibit any s/s concerning for aspiration, please stop PO consumption as restart at next time pt requests. ST will continue to follow. SLP Visit Diagnosis: Dysphagia, pharyngeal phase (R13.13)    Aspiration Risk  Moderate aspiration risk;Risk for inadequate nutrition/hydration    Diet Recommendation Dysphagia 1 (Puree);Nectar-thick liquid   Liquid Administration via: Spoon Medication Administration: Via alternative means Supervision: Staff to assist with [place] feeding;Full supervision/cueing for compensatory strategies Compensations: Minimize environmental distractions;Slow rate;Small sips/bites Postural Changes: Seated upright at 90 degrees;Remain upright for at least 30 minutes after po intake    Other  Recommendations Oral Care Recommendations: Oral care BID Other Recommendations: Order thickener from pharmacy;Prohibited food (jello, ice cream, thin soups);Remove water pitcher    Recommendations for follow up therapy are one component of a multi-disciplinary discharge planning process, led by the attending physician.  Recommendations may be updated based on patient status, additional functional criteria and insurance authorization.  Follow up Recommendations  (TBD)      Assistance Recommended at Discharge Frequent or constant Supervision/Assistance  Functional Status Assessment Patient has had a recent decline in their functional status and/or demonstrates limited ability to make significant improvements in function in a reasonable and predictable amount of time  Frequency and Duration min 2x/week  2 weeks       Prognosis Prognosis for Safe Diet Advancement:  Guarded Barriers to Reach Goals: Severity of deficits      Swallow Study   General Date of Onset: 02/21/22 HPI:  Kaylee Santiago is a 87 y.o. female with a past medical history of atrial fibrillation, hypertension, CVA, presents to the emergency department from her nursing facility for altered mental status.  Per EMS patient is more confused than typical.  They state facility reported chronic back pain but patient has been complaining more about her back recently.  MRI revealed Multiple small areas of acute or subacute infarct compatible with cerebral emboli.    Microhemorrhage right frontal lobe is hyperdense on CT and likely is acute or subacute.    Atrophy and chronic right MCA infarcts. Chronic microvascular ischemic change throughout the white matter. Chest x-ray revealed Chronic bibasilar opacities, left greater than right, likely due to bronchiectasis. No new focal consolidation. Type of Study: Bedside Swallow Evaluation Previous Swallow Assessment: previous MBSS - previous PEG, pt requested PEG removal Diet Prior to this Study: NPO Temperature Spikes Noted: No Respiratory Status: Room air History of Recent Intubation: No Behavior/Cognition: Alert;Cooperative;Pleasant mood Oral Cavity Assessment: Within Functional Limits Oral Care Completed by SLP: No Oral Cavity - Dentition: Adequate natural dentition Faith-Feeding Abilities: Total assist Patient Positioning: Upright in bed Baseline Vocal Quality: Low vocal intensity Volitional Cough: Weak Volitional Swallow: Unable to elicit    Oral/Motor/Sensory Function Overall Oral Motor/Sensory Function: Within functional limits   Ice Chips Ice chips: Impaired Presentation: Spoon Pharyngeal Phase Impairments: Multiple swallows;Suspected delayed Swallow   Thin Liquid Thin Liquid: Impaired Presentation: Spoon Pharyngeal  Phase Impairments: Suspected delayed Swallow;Cough - Immediate    Nectar Thick Nectar Thick Liquid: Impaired Presentation: Spoon Pharyngeal Phase Impairments: Suspected delayed Swallow;Multiple swallows;Throat Clearing - Immediate   Honey  Thick Honey Thick Liquid: Not tested   Puree Puree: Within functional limits Presentation: Spoon   Solid     Solid: Not tested     [name], M.S., CCC-SLP, Tree surgeon Certified Brain Injury Specialist [facility]  [facility] Rehabilitation Services Office [phone] Ascom [phone] Fax [phone]

## 2022-02-17 NOTE — Assessment & Plan Note (Signed)
Most likely demand ischemia in the setting of sepsis and A-fib RVR.  No other evidence of ACS. -- Appreciate cardiology recommendations -- Stat EKG and troponin if patient develops chest pain

## 2022-02-17 NOTE — Assessment & Plan Note (Signed)
Continue levothyroxine 

## 2022-02-17 NOTE — Assessment & Plan Note (Signed)
POA.  I agree with the wound description as outlined below. -- Frequent repositioning -- Diligent wound care -- Monitor closely for signs of infection  Pressure Injury 03/03/2022 Sacrum Left;Right;Mid Stage 2 -  Partial thickness loss of dermis presenting as a shallow open injury with a red, pink wound bed without slough. (Active)  02/17/2022 1750  Location: Sacrum  Location Orientation: Left;Right;Mid  Staging: Stage 2 -  Partial thickness loss of dermis presenting as a shallow open injury with a red, pink wound bed without slough.  Wound Description (Comments):   Present on Admission: Yes

## 2022-02-17 NOTE — Assessment & Plan Note (Signed)
Continue statin. 

## 2022-02-17 NOTE — Assessment & Plan Note (Signed)
Present on admission.  Due to MRSA bacteremia.  Patient met SIRS criteria on admission with fever, leukocytosis and elevated lactic acid consistent with endorgan dysfunction.  Blood cultures positive for MRSA. -- Management as outlined for MRSA bacteremia

## 2022-02-17 NOTE — Consult Note (Cosign Needed)
[facility] CARDIOLOGY CONSULT NOTE       Patient ID: Kaylee Santiago MRN: [id number] DOB/AGE: Mar 01, 1935 87 y.o.  Admit date: 02/10/2022 Referring Physician Dr. Esaw Grandchild  Primary Physician Dr. Graciela Husbands Primary Cardiologist Dr. Lady Gary  Reason for Consultation AF RVR  HPI: Kaylee Santiago is an 42yoF with a PMH of chronic atrial fibrillation on dose reduced Eliquis, hypertension, hypothyroidism, bronchiectasis, CVA 10/2021, chronic L1, L2, L5 compression fractures with new T11 compression fracture who presented to [facility] ED from [facility] assisted [facility] on 02/21/2022 with altered mental status above her baseline and severe back pain.  She was found to be febrile to 100.4, significant leukocytosis to 22.7, blood cx MRSA positive, and head CT imaging with new areas concerning for subacute infarcts, with MRI confirming multiple areas of acute or subacute infarcts with microhemorrhage in the frontal lobe.  Cardiology is consulted for assistance with her atrial fibrillation.  History is obtained from the chart as the patient's speech is dysarthric and she is very anxious and there is no family present at bedside.  She was admitted in April for an acute stroke and during that hospitalization she had a PEG tube placed, it was ultimately removed at the patient's request.  [facility] had [facility] and then was living at [facility] assisted [facility] facility.  She was previously able to ambulate without assistance, but apparently suffered compression fractures of her lumbar spine when making her bed, and had not had any falls per the patient.  She was seen by neurosurgery on 7/27 for the compression fractures, and was recommended to wear a TLSO/LSO brace.  Upon admission, the patient was more confused than her baseline, not recognizing her niece.  She had also reportedly not been eating or drinking much due to issues with her back.  She was discharged on Xarelto 20 mg once daily from her last admission 1  month ago.  This admission, head CT was performed which showed new areas of hypodensity in the right posterior lateral frontal lobe and right occipital lobe concerning for subacute infarcts.  MRI confirmed these multiple areas of acute or subacute infarcts C/W cerebral emboli with microhemorrhage in the right frontal lobe.  Permissive hypertension was recommended by primary team and her metoprolol 25 mg twice daily was held on admission, in addition to her Xarelto due to the microhemorrhages.  Vitals are notable for blood pressure of 152/84, heart rate in the 120s to 140s in atrial fibrillation on telemetry.  SpO2 95% on room air.  Labs are notable for a potassium of 4.1, BUN/creatinine 24/0.74, GFR greater than 60.  CK4 81, high-sensitivity troponin elevated with a flat trend at 342-348.  Lactate downtrending from 2.4-1.9, procalcitonin elevated at 16.8 leukocytosis with WBCs 22.6.  H&H stable at 12/36 with platelets 202.  TC 101, HDL 48, LDL 39, triglycerides 70, VLDL 14.  Review of systems limited by patient's mental status   Past Medical History:  Diagnosis Date   Atrial fibrillation (HCC)    Atrial fibrillation (HCC)    Back pain    Dysrhythmia    Hypertension    Hypothyroidism    Stroke [facility])     Past Surgical History:  Procedure Laterality Date   ABDOMINAL HYSTERECTOMY     BREAST EXCISIONAL BIOPSY Right 70's   CATARACT EXTRACTION W/ INTRAOCULAR LENS  IMPLANT, BILATERAL     KYPHOPLASTY N/A 12/21/2018   Procedure: KYPHOPLASTY L1;  Surgeon: Kennedy Bucker, MD;  Location: [facility] ORS;  Service: Orthopedics;  Laterality: N/A;    Medications Prior to Admission  Medication Sig Dispense Refill Last Dose   acetaminophen (TYLENOL) 325 MG tablet Take 2 tablets (650 mg total) by mouth every 6 (six) hours as needed for mild pain (or Fever >/= 101).   February 26, 2022 at 0900   atorvastatin (LIPITOR) 40 MG tablet Place 40 mg into feeding tube daily.   02/15/2022 at 2100   Bismuth Subsalicylate  (PEPTO-BISMOL MAX STRENGTH) 525 MG/15ML SUSP Take 15 mLs by mouth every 6 (six) hours as needed (DIARRHEA).   PRN at PRN   Lactobacillus Rhamnosus, GG, (CULTURELLE) CAPS Take 1 capsule by mouth daily.   2022-02-26 at 0900   levothyroxine (SYNTHROID) 75 MCG tablet Take 1 tablet by mouth daily.   Feb 26, 2022 at 0600   lisinopril (ZESTRIL) 30 MG tablet Take 30 mg by mouth daily.   February 26, 2022 at 0900   LORazepam (ATIVAN) 0.5 MG tablet Take 0.5 mg by mouth at bedtime as needed for sleep.   02/15/2022 at 2000   melatonin 3 MG TABS tablet Place 3 mg into feeding tube at bedtime.   02/15/2022 at 2000   metoprolol tartrate (LOPRESSOR) 25 MG tablet Place 1 tablet (25 mg total) into feeding tube 2 (two) times daily.   02-26-2022 at 0800   naloxone [facility]) nasal spray 4 mg/0.1 mL Place 1 spray into the nose once.   PRN at PRN   oxyCODONE (OXY IR/ROXICODONE) 5 MG immediate release tablet Take 5 mg by mouth every 6 (six) hours as needed for severe pain.   02-26-22 at 0600   polyethylene glycol powder (GLYCOLAX/MIRALAX) 17 GM/SCOOP powder Place 17 g into feeding tube.   02-26-22 at 0900   psyllium (REGULOID) 0.52 g capsule Take 0.52 g by mouth in the morning and at bedtime.   February 26, 2022 at 0900   senna (SENOKOT) 8.6 MG tablet Place 8.6 mg into feeding tube at bedtime.   02/15/2022 at 2000   sodium chloride 1 g tablet Take 1 tablet (1 g total) by mouth 2 (two) times daily with a meal. 60 tablet 0 02/26/2022 at 0800   tiZANidine (ZANAFLEX) 2 MG tablet Take 2 mg by mouth every 8 (eight) hours as needed.   02/15/2022 at 1757   XARELTO 20 MG TABS tablet Take 20 mg by mouth daily.   2022-02-26 at 0900   Social History   Socioeconomic History   Marital status: Widowed    Spouse name: Not on file   Number of children: Not on file   Years of education: Not on file   Highest education level: Not on file  Occupational History   Not on file  Tobacco Use   Smoking status: Former    Types: Cigarettes   Smokeless tobacco: Never  Vaping  Use   Vaping Use: Never used  Substance and Sexual Activity   Alcohol use: Not Currently   Drug use: Never   Sexual activity: Not on file  Other Topics Concern   Not on file  Social History Narrative   Lives at home independently.  Ambulates steadily.   Social Determinants of Health   Financial Resource Strain: Not on file  Food Insecurity: Not on file  Transportation Needs: Not on file  Physical Activity: Not on file  Stress: Not on file  Social Connections: Not on file  Intimate Partner Violence: Not on file    Family History  Problem Relation Age of Onset   Breast cancer Sister 15   Breast cancer Sister  30's   CAD Mother       PHYSICAL EXAM General: Elderly and frail-appearing Caucasian female, lying flat in bed. HEENT:  Normocephalic and atraumatic. Neck:  No JVD.  Lungs: Normal respiratory effort on room air. Clear bilaterally to auscultation. No wheezes, crackles, rhonchi.  Heart: Tachycardic irregularly irregular rate and rhythm. Normal S1 and S2 without gallops or murmurs.  Abdomen: Non-distended appearing.  Msk: 2/5 grip strength in upper extremities bilaterally, L arm weakness Extremities: No clubbing, cyanosis.  No peripheral edema.  Neuro: Awake and alert, slightly dysarthritic speech, repeats "I am having a stroke" and does not answer questions. Psych: Anxious and tearful.  Labs:   Lab Results  Component Value Date   WBC 22.6 (H) 02/17/2022   HGB 12.0 02/17/2022   HCT 36.1 02/17/2022   MCV 90.9 02/17/2022   PLT 202 02/17/2022    Recent Labs  Lab 02/17/22 0400  NA 134*  K 4.1  CL 100  CO2 25  BUN 24*  CREATININE 0.74  CALCIUM 8.2*  PROT 5.9*  BILITOT 1.0  ALKPHOS 156*  ALT 44  AST 67*  GLUCOSE 143*   Lab Results  Component Value Date   CKTOTAL 481 (H) 02/13/2022   TROPONINI 0.03 (HH) 06/29/2018    Lab Results  Component Value Date   CHOL 101 03/07/2022   CHOL 139 11/08/2021   Lab Results  Component Value Date   HDL 48  03/03/2022   HDL 74 11/08/2021   Lab Results  Component Value Date   LDLCALC 39 02/28/2022   LDLCALC 52 11/08/2021   Lab Results  Component Value Date   TRIG 70 03/03/2022   TRIG 65 11/08/2021   Lab Results  Component Value Date   CHOLHDL 2.1 02/10/2022   CHOLHDL 1.9 11/08/2021   No results found for: "LDLDIRECT"    Radiology: ECHOCARDIOGRAM COMPLETE  Result Date: 02/17/2022    ECHOCARDIOGRAM REPORT   Patient Name:   Kaylee Santiago Botto Date of Exam: 02/27/2022 Medical Rec #:  [id number]    Height:       70.0 in Accession #:    [id number]   Weight:       116.0 lb Date of Birth:  31-Mar-1935   BSA:          1.656 m Patient Age:    86 years     BP:           155/93 mmHg Patient Gender: F            HR:           121 bpm. Exam Location:  [facility] Procedure: 2D Echo, Cardiac Doppler and Color Doppler Indications:     Elevated Troponin  History:         Patient has prior history of Echocardiogram examinations, most                  recent 09/22/2021. Stroke, Arrythmias:Atrial Fibrillation; Risk                  Factors:Hypertension. Hypothyroidism.  Sonographer:     Daphine Deutscher RDCS Referring Phys:  [id number] RONDELL A SMITH Diagnosing Phys: Arnoldo Hooker MD IMPRESSIONS  1. Left ventricular ejection fraction, by estimation, is 65 to 70%. The left ventricle has normal function. The left ventricle has no regional wall motion abnormalities. Left ventricular diastolic parameters were normal.  2. Right ventricular systolic function is normal. The right ventricular size is normal.  3. Left atrial size was  moderately dilated.  4. Right atrial size was moderately dilated.  5. The mitral valve is normal in structure. Moderate to severe mitral valve regurgitation.  6. Tricuspid valve regurgitation is moderate.  7. The aortic valve is normal in structure. Aortic valve regurgitation is mild. FINDINGS  Left Ventricle: Left ventricular ejection fraction, by estimation, is 65 to 70%. The left ventricle has normal  function. The left ventricle has no regional wall motion abnormalities. The left ventricular internal cavity size was small. There is no left ventricular hypertrophy. Left ventricular diastolic parameters were normal. Right Ventricle: The right ventricular size is normal. No increase in right ventricular wall thickness. Right ventricular systolic function is normal. Left Atrium: Left atrial size was moderately dilated. Right Atrium: Right atrial size was moderately dilated. Pericardium: There is no evidence of pericardial effusion. Mitral Valve: The mitral valve is normal in structure. Moderate to severe mitral valve regurgitation. Tricuspid Valve: The tricuspid valve is normal in structure. Tricuspid valve regurgitation is moderate. Aortic Valve: The aortic valve is normal in structure. Aortic valve regurgitation is mild. Pulmonic Valve: The pulmonic valve was normal in structure. Pulmonic valve regurgitation is trivial. Aorta: The aortic root and ascending aorta are structurally normal, with no evidence of dilitation. IAS/Shunts: No atrial level shunt detected by color flow Doppler.  LEFT VENTRICLE PLAX 2D LVIDd:         3.60 cm LVIDs:         2.30 cm LV PW:         1.00 cm LV IVS:        1.00 cm LVOT diam:     1.90 cm LV SV:         28 LV SV Index:   17 LVOT Area:     2.84 cm  RIGHT VENTRICLE RV Basal diam:  2.30 cm RV S prime:     6.06 cm/s TAPSE (M-mode): 1.4 cm LEFT ATRIUM             Index        RIGHT ATRIUM           Index LA diam:        4.30 cm 2.60 cm/m   RA Area:     15.90 cm LA Vol (A2C):   88.2 ml 53.26 ml/m  RA Volume:   39.50 ml  23.85 ml/m LA Vol (A4C):   58.8 ml 35.51 ml/m LA Biplane Vol: 72.5 ml 43.78 ml/m  AORTIC VALVE LVOT Vmax:   57.43 cm/s LVOT Vmean:  45.367 cm/s LVOT VTI:    0.098 m  AORTA Ao Root diam: 3.40 cm Ao Asc diam:  3.10 cm MV E velocity: 80.27 cm/s  TRICUSPID VALVE                            TR Peak grad:   37.5 mmHg                            TR Vmax:        306.00 cm/s                              SHUNTS                            Systemic VTI:  0.10 m  Systemic Diam: 1.90 cm Arnoldo Hooker MD Electronically signed by Arnoldo Hooker MD Signature Date/Time: 02/17/2022/12:47:40 PM    Final    CT HEAD WO CONTRAST ( )  Result Date: 02/17/2022 CLINICAL DATA:  87 year old female with multiple small acute infarcts in the cerebral and cerebellar hemispheres on MRI yesterday. Subsequent encounter. EXAM: CT HEAD WITHOUT CONTRAST TECHNIQUE: Contiguous axial images were obtained from the base of the skull through the vertex without intravenous contrast. RADIATION DOSE REDUCTION: This exam was performed according to the departmental dose-optimization program which includes automated exposure control, adjustment of the mA and/or kV according to patient size and/or use of iterative reconstruction technique. COMPARISON:  Brain MRI and head CT 03/12/22. FINDINGS: Brain: CT hypodensity is developing encephalomalacia in the right MCA and right MCA/PCA watershed areas on the DWI yesterday. And punctate dystrophic calcification versus intra-axial hemorrhage in the right inferior frontal gyrus appears unchanged (series 3, image 15 today). Scattered small cortical and subcortical infarcts demonstrated by MRI yesterday remain occult on CT. No other intracranial hemorrhage. No midline shift, mass effect, or evidence of intracranial mass lesion. No ventriculomegaly. Stable gray-white matter differentiation throughout the brain. Vascular: Calcified atherosclerosis at the skull base. No suspicious intracranial vascular hyperdensity. Skull: Stable visualized osseous structures. Sinuses/Orbits: Visualized paranasal sinuses and mastoids are stable and well aerated. Other: No acute orbit or scalp soft tissue finding. IMPRESSION: 1. Multiple small cerebral and cerebellar infarcts demonstrated by MRI yesterday remain occult by CT. And a small focus of hemorrhage versus calcification in  the anterior right frontal lobe is unchanged. 2. No intracranial mass effect or new intracranial abnormality. Developing encephalomalacia in the Right MCA and MCA/PCA watershed areas. Electronically Signed   By: Odessa Fleming M.D.   On: 02/17/2022 11:17   MR BRAIN WO CONTRAST  Result Date: 03/12/2022 CLINICAL DATA:  Stroke follow-up. EXAM: MRI HEAD WITHOUT CONTRAST TECHNIQUE: Multiplanar, multiecho pulse sequences of the brain and surrounding structures were obtained without intravenous contrast. COMPARISON:  CT head Mar 12, 2022 FINDINGS: Brain: Multiple small areas of acute infarct are present. Small acute infarct in the right inferior cerebellum. Small areas of acute infarct in the occipital parietal lobes bilaterally. Small acute infarct in the right head of caudate and external capsule. Microhemorrhage right frontal lobe is mildly hyperdense on CT and was not present on the susceptibility weighted image on MRI from 11/08/2021. Probable acute or subacute small hemorrhage. Chronic hemorrhage right frontal parietal lobe and right parietal lobe. Generalized atrophy without hydrocephalus. Moderate chronic microvascular ischemic change throughout the white matter. No mass lesion. Vascular: Normal arterial flow voids at the skull base Skull and upper cervical spine: No focal skeletal lesion. Sinuses/Orbits: Paranasal sinuses clear. Bilateral cataract extraction Other: None IMPRESSION: Multiple small areas of acute or subacute infarct compatible with cerebral emboli. Microhemorrhage right frontal lobe is hyperdense on CT and likely is acute or subacute. Atrophy and chronic right MCA infarcts. Chronic microvascular ischemic change throughout the white matter. Electronically Signed   By: Marlan Palau M.D.   On: Mar 12, 2022 16:33   CT L-SPINE NO CHARGE  Result Date: 2022-03-12 CLINICAL DATA:  Back pain EXAM: CT LUMBAR SPINE WITHOUT CONTRAST TECHNIQUE: Multidetector CT imaging of the lumbar spine was performed without  intravenous contrast administration. Multiplanar CT image reconstructions were also generated. RADIATION DOSE REDUCTION: This exam was performed according to the departmental dose-optimization program which includes automated exposure control, adjustment of the mA and/or kV according to patient size and/or use of iterative reconstruction technique. COMPARISON:  X-ray 02/04/2022, MRI  01/12/2022 FINDINGS: Segmentation: 5 lumbar type vertebrae. Alignment: No traumatic listhesis. Progressive retropulsion of the L3 vertebral body compared to the prior MRI. Similar degree of mild retropulsion of the superior endplate of L2. Vertebrae: Acute compression fracture of the T11 vertebral body with approximately 40% vertebral body height loss. No significant retropulsion. No evidence of fracture extension into the posterior elements. Severe compression fracture of the L3 vertebral body with vertebral plana morphology has progressed from the previous MRI and may be minimally progressed from the previous radiograph. Approximately 6 mm of retropulsion of the L3 vertebral body. Acute appearing bilateral nondisplaced L3 transverse process fractures. Progressive height loss of the L2 vertebral body compression fracture now with approximately 80% height loss. Chronic compression fracture of the L1 vertebral bodies status post cement augmentation. Paraspinal and other soft tissues: See dedicated CT abdomen pelvis for evaluation of the intra-abdominal findings. Disc levels: Multilevel lumbar spondylosis with suspected at least moderate canal stenosis at the L3 level secondary to bony retropulsion. Remaining levels appear similar to the recent lumbar spine MRI. IMPRESSION: 1. Acute compression fracture of the T11 vertebral body with approximately 40% vertebral body height loss. 2. Severe compression fracture of the L3 vertebral body with vertebral plana morphology has progressed from the previous MRI and may be minimally progressed from  the previous radiograph. Approximately 6 mm of retropulsion of the L3 vertebral body where there is suspected to be at least moderate canal stenosis. 3. Acute nondisplaced fractures of the bilateral L3 transverse processes. 4. Progressive height loss of the L2 vertebral body compression fracture now with approximately 80% height loss. 5. Chronic compression fracture of the L1 vertebral bodies status post cement augmentation. Electronically Signed   By: Duanne GuessNicholas  Plundo D.O.   On: Jan 31, 2022 14:17   CT ABDOMEN PELVIS WO CONTRAST  Result Date: 03/04/2022 CLINICAL DATA:  Acute abdominal pain EXAM: CT ABDOMEN AND PELVIS WITHOUT CONTRAST TECHNIQUE: Multidetector CT imaging of the abdomen and pelvis was performed following the standard protocol without IV contrast. No oral contrast administered. Examination limited by lack of IV/oral contrast and beam hardening artifacts. RADIATION DOSE REDUCTION: This exam was performed according to the departmental dose-optimization program which includes automated exposure control, adjustment of the mA and/or kV according to patient size and/or use of iterative reconstruction technique. COMPARISON:  04/08/2021 FINDINGS: Lower chest: Bibasilar atelectasis with chronic interstitial changes at LEFT lower lobe. Hepatobiliary: Distended gallbladder. Calcified granuloma within liver. No focal hepatic mass evident. Pancreas: Atrophic pancreas, suboptimally seen due to beam hardening artifacts from arms. Spleen: Normal appearance Adrenals/Urinary Tract: Extrarenal pelves bilaterally. No definite renal mass or hydronephrosis. Bladder unremarkable. Stomach/Bowel: Stomach decompressed. Questionable mild rectal wall thickening versus artifact from underdistention. Bowel loops otherwise unremarkable. Vascular/Lymphatic: Atherosclerotic calcifications aorta, iliac arteries, visceral arteries. Enlargement of cardiac silhouette. Small amount of air within RIGHT atrium question related to IV access.  Reproductive: Uterus surgically absent Other: No free air or free fluid.  No hernia. Musculoskeletal: Diffuse osseous demineralization. Compression fractures of L1, L2, L3 and T11, with prior spinal augmentation procedure of L1. The marked compression fracture of L3 is new since the prior study, with mild retropulsion of posterior endplate superiorly. Superior endplate fracture of T11 is new. Fractures at L1 and L2 are stable. IMPRESSION: Questionable mild rectal wall thickening versus artifact from underdistention, recommend correlation with digital rectal exam and proctoscopy. New compression fractures of T11 and L3. Bibasilar atelectasis and chronic interstitial changes at LEFT lower lobe. Distended gallbladder. Aortic Atherosclerosis (ICD10-I70.0). Electronically Signed  By: Ulyses Southward M.D.   On: 02/24/2022 14:13   CT HEAD WO CONTRAST ( )  Result Date: 2022-02-24 CLINICAL DATA:  Altered mental status EXAM: CT HEAD WITHOUT CONTRAST TECHNIQUE: Contiguous axial images were obtained from the base of the skull through the vertex without intravenous contrast. RADIATION DOSE REDUCTION: This exam was performed according to the departmental dose-optimization program which includes automated exposure control, adjustment of the mA and/or kV according to patient size and/or use of iterative reconstruction technique. COMPARISON:  11/07/2021 CT head, correlation is also made with 11/08/2021 MRI head FINDINGS: Brain: Hypodensity in the right posterolateral frontal lobe (series 3, image 23) and right occipital lobe (series 3, image 16), which are new from the prior CT; part of the right posterolateral frontal lobe hypodensity correlates with an area of restricted diffusion on the prior MRI. Tiny areas of hyperdensity in the right frontal lobe (series 3, image 16 are nonspecific and may represent small foci hemorrhage versus calcifications. No mass, mass effect, or midline shift. No hydrocephalus or additional extra-axial  collection. Periventricular white matter changes, likely the sequela of chronic small vessel ischemic disease. Vascular: No hyperdense vessel. Skull: Normal. Negative for fracture or focal lesion. Sinuses/Orbits: No acute finding. Status post bilateral lens replacements. Other: The mastoid air cells are well aerated. IMPRESSION: New areas of hypodensity in the right posterolateral frontal lobe and right occipital lobe, age indeterminate but possibly subacute infarcts. In addition there is possible subarachnoid hemorrhage versus calcifications in the right frontal lobe that are new compared to the prior exam. An MRI is recommended for further evaluation. These results were called by telephone at the time of interpretation on 02-24-2022 at 2:08 pm to provider [facility] , who verbally acknowledged these results. Electronically Signed   By: Wiliam Ke M.D.   On: 24-Feb-2022 14:09   DG Chest Port 1 View  Result Date: 02-24-22 CLINICAL DATA:  Altered mental status EXAM: PORTABLE CHEST 1 VIEW COMPARISON:  09/21/2021, 03/26/2020 FINDINGS: Stable cardiomediastinal contours. Aortic atherosclerosis. Chronic bibasilar opacities, left greater than right, likely due to bronchiectasis. Hyperinflated lungs. No new focal consolidation. No pleural effusion or pneumothorax. IMPRESSION: Chronic bibasilar opacities, left greater than right, likely due to bronchiectasis. No new focal consolidation. Electronically Signed   By: Duanne Guess D.O.   On: February 24, 2022 13:19   DG Lumbar Spine 2-3 Views  Result Date: 02/05/2022 CLINICAL DATA:  L3 fracture. EXAM: LUMBAR SPINE - 2-3 VIEW COMPARISON:  January 18, 2022. FINDINGS: Status post kyphoplasty of L1 vertebral body. Old L2 compression fracture is again noted. Stable severe compression deformity of L3 vertebral body consistent with acute to subacute fracture. No spondylolisthesis is noted. Moderate degenerative changes noted at L3-4, L4-5 and L5-S1. IMPRESSION: Stable severe  compression deformity of L3 vertebral body is noted consistent with acute to subacute fracture. Status post kyphoplasty of L1 vertebral body. Old L2 compression fracture is again noted. Electronically Signed   By: Lupita Raider M.D.   On: 02/05/2022 12:02   DG Lumbar Spine 2-3 Views  Result Date: 01/18/2022 CLINICAL DATA:  Status post lumbar spine operation EXAM: LUMBAR SPINE - 2-3 VIEW COMPARISON:  MRI lumbar spine 01/12/2022 FINDINGS: Dextroconvex upper and levoconvex lower lumbar curvature. There is in unchanged chronic L1 compression deformity with prior vertebroplasty without progressive height loss. There is an unchanged chronic compression deformity of L2 without evidence of progressive height loss. There is progressive height loss of an L3 compression fracture, with up to 80% height loss. Stable compression  deformity of L5. There is with 2 level degenerative disc disease and facet arthropathy as demonstrated on recent lumbar spine MRI. There is chronic bony retropulsion at L2. IMPRESSION: Progressive height loss of the L3 compression fracture, now up to 80%. Unchanged chronic L1, L2, and L5 compression fractures. Electronically Signed   By: Caprice Renshaw M.D.   On: 01/18/2022 14:23     TELEMETRY reviewed by me: Atrial fibrillation rate 110s to 140s.  EKG reviewed by me: AF RVR rate 110   ASSESSMENT AND PLAN:  Tarea Vallas is an 67yoF with a PMH of chronic atrial fibrillation on dose reduced Eliquis, hypertension, hypothyroidism, bronchiectasis, CVA 10/2021, chronic L1, L2, L5 compression fractures with new T11 compression fracture who presented to [facility] ED from [facility] on 03-07-22 with altered mental status above her baseline and chronic back pain.  She was found to be febrile to 100.4, significant leukocytosis to 22.7, blood cx MRSA positive, and head CT imaging with new areas concerning for subacute infarcts, with MRI confirming multiple areas of acute or subacute infarcts with  microhemorrhage in the frontal lobe.  Cardiology is consulted for assistance with her atrial fibrillation.  # chronic atrial fibrillation with RVR  # MRSA bacteremia # acute CVA  # elevated troponin  She presents with confusion above her baseline and significant lower back pain, was found to have multifocal embolic appearing infarcts and in the cerebellum with concern for hemorrhage.  She also has significant leukocytosis with WBCs 22, was febrile on admission, and blood cultures positive for MRSA.  Her metoprolol was held to allow for permissive hypertension and she is been tachycardic in the 110s up to the 140s throughout the day in this setting.  Her troponin is elevated to the 300s, she did not have any chest pain on admission per chart review -this is likely demand from her tachycardia and from her CVA rather than ACS. -Agree with current therapy per primary team -Continue to treat underlying causes of increased adrenergic tone including pain, fever, infection -Continuous monitoring on telemetry while inpatient -Can give metoprolol 2.5 mg IV every 6 hours for heart rate greater than 120 and systolic blood pressure >160 as long as permissive hypertension parameters are in place. -Recommend restarting home metoprolol tartrate 25 mg twice daily once outside of permissive hypertension window and if able to take p.o. -Continue aspirin 81 mg daily, atorvastatin 40 mg -Defer to neurology when safe to start back Xarelto in the setting of hemorrhagic embolus -echo complete, resulted with preserved LVEF, mod-severe MR  This patient's plan of care was discussed and created with Dr. Gwen Pounds and he is in agreement.  Signed: Rebeca Allegra , PA-C 02/17/2022, 1:41 PM [facility] Cardiology  I have discussed with the patient and the family about current condition.  It appears that the patient does have significant issues with multiple strokes for unknown reasons.  Currently the patient is not able  to converse well.  I personally reviewed her echocardiogram chest x-ray and current telemetry.  The patient does have chronic atrial fibrillation and currently more rapid at this time and cannot receive a significant amount of medication management that can cause low blood pressure.  We have considered that the patient has normal LV function and should be able to tolerate the faster heart rate at this time with no current evidence of congestive heart failure.  The patient does have positive blood cultures with MRSA which is likely causing her current issues.  We will  have a discussion about this and the possibility of transesophageal echocardiogram if necessary.  The patient has been interviewed and examined. I agree with assessment and plan above. Arnoldo Hooker MD [facility]

## 2022-02-17 NOTE — Assessment & Plan Note (Signed)
Pt had PEG tube after prior stroke, since removed per patient's request.  Pt states would not want any artificial nutrition going forward. --SLP following --Aspiration precautions --Currently on pureed diet, nectar thick liquids --Gentle maintenance IV hydration

## 2022-02-18 ENCOUNTER — Encounter: Payer: Self-pay | Admitting: Internal Medicine

## 2022-02-18 DIAGNOSIS — A419 Sepsis, unspecified organism: Secondary | ICD-10-CM | POA: Diagnosis not present

## 2022-02-18 DIAGNOSIS — R6520 Severe sepsis without septic shock: Secondary | ICD-10-CM | POA: Diagnosis not present

## 2022-02-18 DIAGNOSIS — R7881 Bacteremia: Secondary | ICD-10-CM | POA: Diagnosis not present

## 2022-02-18 DIAGNOSIS — I639 Cerebral infarction, unspecified: Secondary | ICD-10-CM | POA: Diagnosis not present

## 2022-02-18 DIAGNOSIS — Z7189 Other specified counseling: Secondary | ICD-10-CM | POA: Diagnosis not present

## 2022-02-18 DIAGNOSIS — I4819 Other persistent atrial fibrillation: Secondary | ICD-10-CM | POA: Diagnosis not present

## 2022-02-18 LAB — CULTURE, BLOOD (ROUTINE X 2)
Special Requests: ADEQUATE
Special Requests: ADEQUATE

## 2022-02-18 LAB — MAGNESIUM: Magnesium: 2.3 mg/dL (ref 1.7–2.4)

## 2022-02-18 LAB — CBC
HCT: 37.8 % (ref 36.0–46.0)
Hemoglobin: 12.6 g/dL (ref 12.0–15.0)
MCH: 30.3 pg (ref 26.0–34.0)
MCHC: 33.3 g/dL (ref 30.0–36.0)
MCV: 90.9 fL (ref 80.0–100.0)
Platelets: 169 10*3/uL (ref 150–400)
RBC: 4.16 MIL/uL (ref 3.87–5.11)
RDW: 14 % (ref 11.5–15.5)
WBC: 20.3 10*3/uL — ABNORMAL HIGH (ref 4.0–10.5)
nRBC: 0 % (ref 0.0–0.2)

## 2022-02-18 LAB — BASIC METABOLIC PANEL
Anion gap: 12 (ref 5–15)
BUN: 25 mg/dL — ABNORMAL HIGH (ref 8–23)
CO2: 21 mmol/L — ABNORMAL LOW (ref 22–32)
Calcium: 8 mg/dL — ABNORMAL LOW (ref 8.9–10.3)
Chloride: 101 mmol/L (ref 98–111)
Creatinine, Ser: 0.73 mg/dL (ref 0.44–1.00)
GFR, Estimated: >60 mL/min (ref >60)
Glucose, Bld: 142 mg/dL — ABNORMAL HIGH (ref 70–99)
Potassium: 3.9 mmol/L (ref 3.5–5.1)
Sodium: 134 mmol/L — ABNORMAL LOW (ref 135–145)

## 2022-02-18 LAB — CK: Total CK: 216 U/L (ref 38–234)

## 2022-02-18 MED ORDER — GLYCOPYRROLATE 1 MG PO TABS
1.0000 mg | ORAL_TABLET | Freq: Three times a day (TID) | ORAL | Status: DC | PRN
  Filled 2022-02-18 (×2): qty 1

## 2022-02-18 MED ORDER — DILTIAZEM HCL-DEXTROSE 125-5 MG/125ML-% IV SOLN (PREMIX)
5.0000 mg/h | INTRAVENOUS | Status: DC
  Administered 2022-02-18: 5 mg/h via INTRAVENOUS
  Filled 2022-02-18 (×3): qty 125

## 2022-02-18 MED ORDER — BLISTEX MEDICATED EX OINT
TOPICAL_OINTMENT | CUTANEOUS | Status: DC | PRN
  Filled 2022-02-18: qty 7

## 2022-02-18 MED ORDER — GLYCOPYRROLATE 0.2 MG/ML IJ SOLN
0.1000 mg | Freq: Four times a day (QID) | INTRAMUSCULAR | Status: DC | PRN
Start: 2022-02-18 — End: 2022-02-19

## 2022-02-18 MED ORDER — ADULT MULTIVITAMIN W/MINERALS CH: 1.0000 | ORAL_TABLET | Freq: Every day | ORAL | Status: DC

## 2022-02-18 MED ORDER — NEPRO/CARBSTEADY PO LIQD
237.0000 mL | Freq: Three times a day (TID) | ORAL | Status: DC
  Administered 2022-02-18: 237 mL via ORAL

## 2022-02-18 MED ORDER — DILTIAZEM LOAD VIA INFUSION
10.0000 mg | Freq: Once | INTRAVENOUS | Status: DC
  Filled 2022-02-18: qty 10

## 2022-02-18 MED ORDER — DILTIAZEM HCL 25 MG/5ML IV SOLN
10.0000 mg | Freq: Once | INTRAVENOUS | Status: AC
  Administered 2022-02-18: 10 mg via INTRAVENOUS
  Filled 2022-02-18: qty 5

## 2022-02-18 MED ORDER — ASCORBIC ACID 500 MG PO TABS: 500.0000 mg | ORAL_TABLET | Freq: Two times a day (BID) | ORAL | Status: DC

## 2022-02-18 NOTE — Progress Notes (Signed)
Patient ID: Kaylee Santiago, female   DOB: 1935/02/22, 87 y.o.   MRN: [id number]  During report an order was written for In and Out cath. Night shift RN assisted patient to [facility] to see if she could void. Unsuccessful. In and Out cath volume 450. Patient Heart rate elevated, now in red Mews. DO notified. Instructed not to call Rapid Response. Will be transferred and placed on a Diltiazem drip.   Lidia Collum, RN

## 2022-02-18 NOTE — Progress Notes (Signed)
Civil engineer, contracting [facility]) Hospital Liaison Note  Received request PMT provider/Crystal Valentina Lucks for family interest in Hospice Home. Niece/Kathy voiced consent to proceed with Hospice Home evaluation.   Approval for Hospice Home is determined by [facility] MD. Once [facility] MD has determined Hospice Home eligibility, [facility] will update hospital staff and family. Approved.  Please do not hesitate to call with any hospice related questions.    Thank you for the opportunity to participate in this patient's care.  Odette Fraction, MSW [facility] Liaison  [phone]

## 2022-02-18 NOTE — Progress Notes (Signed)
Progress Note   Patient: Kaylee Santiago GGY:[id number] DOB: 1935-02-02 DOA: 03/09/2022     2 DOS: the patient was seen and examined on 02/17/2022   Brief hospital course:  Kaylee Santiago is a 87 y.o. female with medical history significant of hypertension, atrial fibrillation on Xarelto, CVA in 10/2021,  bronchiectasis,hypothyroidism, and recent lumbar compression fractures who presented from [facility] on 03/06/2022 with altered mental status and severe low back pain with inability to move her legs.  See H&P for remainder of HPI on admission.  In the ED, patient met SIRS criteria with fever and leukocytosis.  Blood cultures subsequently positive for MRSA.  Patient on IV vancomycin.  Evaluation for infectious source is underway.  ID is following.  Neuroimaging revealed multiple new acute versus subacute embolic CVAs involving right posterior lateral frontal lobe and right occipital lobe, microhemorrhage noted in the right frontal lobe.  For back pain, CT lumbar spine showed acute compression fracture of T11 and progression of L3 compression fracture with 6 mm retropulsion with suspected moderate canal stenosis.    Consultants:  Neurology Neurosurgery Infectious disease Cardiology Palliative Care  Assessment and Plan: * Severe sepsis ([place]) Present on admission.  Due to MRSA bacteremia.  Patient met SIRS criteria on admission with fever, leukocytosis and elevated lactic acid consistent with endorgan dysfunction.  Blood cultures positive for MRSA. -- Management as outlined for MRSA bacteremia  Elevated troponin Most likely demand ischemia in the setting of sepsis and A-fib RVR.  No other evidence of ACS. -- Appreciate cardiology recommendations -- Stat EKG and troponin if patient develops chest pain  Acute encephalopathy Most likely multifactorial due to acute embolic strokes and infection.   8/10: Mental status improving, more alert & interactive today. -- Manage underlying conditions as  outlined -- Delirium precautions -- Neurochecks  Acute CVA (cerebrovascular accident) [facility]) Patient presented with altered mental status/encephalopathy.  Noncontrast head CT showed new areas of hypodensity in the right posterior lateral frontal lobe and right occipital lobe concerning for subacute infarct.  MRI confirmed multiple areas of acute versus subacute infarcts compatible with cerebral emboli with microhemorrhage noted in the right frontal lobe. -- Neurology following -- Continue neurochecks -- Hold Xarelto -- Permissive hypertension -- Follow-up PT OT SLP recommendations --Continue aspirin -- Risk factor modification  Chronic anticoagulation Patient takes Xarelto for A-fib, on hold due to microhemorrhage seen in the right frontal lobe on MRI.  Atrial fibrillation, persistent (HCC) Currently in A-fib RVR secondary to metoprolol being held for permissive hypertension in setting of acute CVA. 8/10: HR's this AM 140's to 150's --Discussed with neurology re permissive HTN, okay with Cardizem drip --Transfer to Progressive --Cardizem load & drip 5-15 mg/hr --Home metoprolol on hold --IV metoprolol 2.5 mg PRN HR > 120 despite drip --Telemetry --Cardiology following, appreciate recommendations --Hold Xarelto x 1 week from symptom onset per neurology  Compression fracture of T11 vertebra (HCC) See intractable back pain  Closed compression fracture of L3 lumbar vertebra, with routine healing, subsequent encounter See intractable back pain  Intractable back pain Secondary to acute compression fracture of T11, recent compression fractures of L1, L2, L3, L5, seen by neurosurgery on 7/27.  Conservative management was recommended including TLSO/LSO brace. -- Brace when out of bed -- Pain management per orders -- PT OT -- Neurosurgery recommends upright lumbar x-rays when patient able to tolerate.  No plan for neurosurgical intervention at this  time.  Hypertension Antihypertensives held on admission in the setting of acute strokes, allowing  for permissive hypertension. 8/10: Now on Cardizem gtt for rapid A-fib  Hyponatremia Mild, sodium on admission 134 appears chronic.  Most likely hypovolemic given elevated BUN/Cr ratio, elevated CK, poor skin turgor on exam noted.   -- Continue IV hydration until adequate p.o. intake -- Monitor BMP  Bronchiectasis without complication (HCC) Stable without exacerbation at this time. -- Incentive spirometry -- As needed bronchodilators, mucolytics  Hypothyroidism Continue levothyroxine  Hyperlipidemia Continue statin  MRSA bacteremia Blood cultures from admission positive for MRSA.  Unclear source. -- Infectious disease consulted -- Continue vancomycin -- TTE pending, likely needs TEE -- Monitor fever curve, CBC, trend Pro-Cal -- Otherwise management as outlined   Transaminitis Most likely due to sepsis.  Monitor LFTs.  Pressure injury of skin POA.  I agree with the wound description as outlined below. -- Frequent repositioning -- Diligent wound care -- Monitor closely for signs of infection  Pressure Injury 02/26/2022 Sacrum Left;Right;Mid Stage 2 -  Partial thickness loss of dermis presenting as a shallow open injury with a red, pink wound bed without slough. (Active)  02/24/2022 1750  Location: Sacrum  Location Orientation: Left;Right;Mid  Staging: Stage 2 -  Partial thickness loss of dermis presenting as a shallow open injury with a red, pink wound bed without slough.  Wound Description (Comments):   Present on Admission: Yes      Dysphagia due to recent cerebrovascular accident Pt had PEG tube after prior stroke, since removed per patient's request.  Pt states would not want any artificial nutrition going forward. --SLP following --Aspiration precautions --Currently on pureed diet, nectar thick liquids --Gentle maintenance IV hydration  Protein-calorie  malnutrition, severe Related to chronic illnesses (COPD, pulmonary fibrosis, multiple CVAs, dysphagia), as evidenced by severe fat and muscle depletion. --Appreciate dietitian recommendations -- Started on Nepro shakes, Magic cups, vitamin C and multivitamin -- Monitor and encourage p.o. intake -- Patient previously had PEG tube after prior stroke which was removed at her request and patient has stated she would not want any feeding tube or artificial nutrition  Elevated CK-resolved as of 03/01/2022 CK 481 consistent with mild rhabdomyolysis. Resolved with IV hydration.         Subjective: Patient awake sitting up in bed with Kaylee Santiago niece at bedside when seen on rounds this morning.  Patient is more alert and interactive today.  Dysarthria was still present but improved.  Patient reports feeling better, actually says she feels "fine".  Denies significant back pain at this time.  She denies chest pain shortness of breath dizziness lightheadedness or palpitations.  She does say that she occasionally gets short of breath at home however.  Had to transfer to progressive this morning for Cardizem drip due to ongoing rapid A-fib.  Physical Exam: Vitals:   02/13/2022 0823 03/02/2022 0854 02/14/2022 1016 02/23/2022 1143  BP: (!) 141/90 (!) 152/97 136/86 117/72  Pulse: (!) 133 89 (!) 160 (!) 143  Resp: (!) 22 (!) 25 (!) 23 (!) 26  Temp: 99.4 F (37.4 C) 100 F (37.8 C) 99.7 F (37.6 C) 97.6 F (36.4 C)  TempSrc:      SpO2: 95% 95% 95% 91%  Weight:       General exam: awake, alert, no acute distress HEENT: Dry mucus membranes, hearing grossly normal  Respiratory system: Lungs clear without wheezes or rhonchi, normal respiratory effort, on room air. Cardiovascular system: Irregularly irregular, no pedal edema.   Gastrointestinal system: Soft nontender abdomen Central nervous system: A&O x. no gross focal neurologic deficits,  normal speech Extremities: moves all, no edema, normal tone Skin: dry,  intact, normal temperature Psychiatry: normal mood, congruent affect, judgement and insight appear normal  Data Reviewed:  Notable labs: Sodium 134, bicarb 21, glucose 142, BUN 25, calcium 8.0.  CK normalized to 16, WBC 20.3 improving.  Notable micro: Urine culture from 8/8 growing Staph epidermidis, susceptibilities pending Wound culture from 8/9 growing moderate GPC in pairs, rare GPR's, too young to read  Transthoracic echo 8/9 --EF 65 to 70%, no LV regional WMA's, normal diastolic parameters, both atria moderately dilated, moderate to severe MR, moderate TR   Family Communication: Niece was at bedside on rounds  Disposition: Status is: Inpatient Remains inpatient appropriate because: Severity of illness as outlined above on multiple IV therapies, ongoing evaluation not appropriate for outpatient setting   Planned Discharge Destination: Skilled nursing facility    Time spent: 55 minutes including time spent bedside and in coordination of care  Author: Ezekiel Slocumb, DO 03/05/2022 2:18 PM  For on call review [email or web address].

## 2022-02-18 NOTE — Consult Note (Signed)
Consultation Note Date: 02/26/2022   Patient Name: Kaylee Santiago  DOB: [birth year]  MRN: [id number]  Age / Sex: 87 y.o., female  PCP: [facility] Referring Physician: Ezekiel Slocumb, DO  Reason for Consultation: Establishing goals of care  HPI/Patient Profile:  Kaylee Santiago is a 87 y.o. female with medical history significant of hypertension, atrial fibrillation on Xarelto, CVA in 10/2021,  bronchiectasis,hypothyroidism, and recent lumbar compression fractures who presented from [facility] on 03/02/2022 with altered mental status and severe low back pain with inability to move her legs.  See H&P for remainder of HPI on admission.    Clinical Assessment and Goals of Care:  Notes and labs reviewed. Spoke with attending about case. In to speak with patient who appears extremely weak and frail. Niece Manuela Schwartz is at bedside. Patient is a retired Marine scientist. She is widowed with 1 child who is now deceased. Her nieces are her family and her HPOAs.   We discussed her diagnoses, prognosis, GOC, EOL wishes disposition and options.  Created space and opportunity for patient  to explore thoughts and feelings regarding current medical information.   A detailed discussion was had today regarding advanced directives.  Concepts specific to code status, artifical feeding and hydration, IV antibiotics and rehospitalization were discussed.  The difference between an aggressive medical intervention path and a comfort care path was discussed.  Values and goals of care important to patient and family were attempted to be elicited.  Discussed limitations of medical interventions to prolong quality of life in some situations and discussed the concept of human mortality.  She verbalizes understanding and appreciation of detailed discussion.   Patient quickly states she knows her time is limited. She states she is tired and  is ready to go. She requests to go to hospice facility. Manuela Schwartz is 3rd HPOA. Manuela Schwartz called Juliann Pulse who they state is primary HPOA, and she is in agreement with these wishes.    I completed a MOST form through [place] today with Juliann Pulse as patient does not feel seh has the strength to complete it. The patient outlined their wishes for the following treatment decisions:  Cardiopulmonary Resuscitation: Do Not Attempt Resuscitation (DNR/No CPR)  Medical Interventions: Comfort Measures: Keep clean, warm, and dry. Use medication by any route, positioning, wound care, and other measures to relieve pain and suffering. Use oxygen, suction and manual treatment of airway obstruction as needed for comfort. Do not transfer to the hospital unless comfort needs cannot be met in current location.  Antibiotics: No antibiotics (use other measures to relieve symptoms)  IV Fluids: No IV fluids (provide other measures to ensure comfort)  Feeding Tube: No feeding tube       SUMMARY OF RECOMMENDATIONS   Shift to comfort care. Hospice facility placement.   Prognosis:  < 2 weeks       Primary Diagnoses: Present on Admission:  Acute encephalopathy  Acute CVA (cerebrovascular accident) ([place])  Severe sepsis ([place])  Elevated troponin  Atrial fibrillation, persistent (HCC)  Hypothyroidism  Intractable back pain  Compression fracture of T11 vertebra (HCC)  Hypertension  Hyponatremia  Transaminitis  MRSA bacteremia  (Resolved) Elevated CK  Protein-calorie malnutrition, severe  Hyperlipidemia   I have reviewed the medical record, interviewed the patient and family, and examined the patient. The following aspects are pertinent.  Past Medical History:  Diagnosis Date   Atrial fibrillation [facility])    Atrial fibrillation (HCC)    Back pain    Dysrhythmia    Elevated CK 02/17/2022   Hypertension    Hypothyroidism    Stroke [facility])    Social History   Socioeconomic History   Marital status: Widowed    Spouse name:  Not on file   Number of children: Not on file   Years of education: Not on file   Highest education level: Not on file  Occupational History   Not on file  Tobacco Use   Smoking status: Former    Types: Cigarettes   Smokeless tobacco: Never  Vaping Use   Vaping Use: Never used  Substance and Sexual Activity   Alcohol use: Not Currently   Drug use: Never   Sexual activity: Not on file  Other Topics Concern   Not on file  Social History Narrative   Lives at home independently.  Ambulates steadily.   Social Determinants of Health   Financial Resource Strain: Not on file  Food Insecurity: Not on file  Transportation Needs: Not on file  Physical Activity: Not on file  Stress: Not on file  Social Connections: Not on file   Family History  Problem Relation Age of Onset   Breast cancer Sister 25   Breast cancer Sister        66's   CAD Mother    Scheduled Meds:  vitamin C  500 mg Oral BID   aspirin EC  81 mg Oral Daily   atorvastatin  40 mg Per Tube Daily   Chlorhexidine Gluconate Cloth  6 each Topical Q0600   feeding supplement (NEPRO CARB STEADY)  237 mL Oral TID BM   levothyroxine  75 mcg Oral Daily   metoprolol tartrate  2.5 mg Intravenous Q6H   [START ON 03-16-2022] multivitamin with minerals  1 tablet Oral Daily   mupirocin ointment  1 Application Nasal BID   psyllium  1 packet Oral BID   saccharomyces boulardii  250 mg Oral Daily   sodium chloride flush  3 mL Intravenous Q12H   Continuous Infusions:  sodium chloride 75 mL/hr at 02/17/22 2254   diltiazem (CARDIZEM) infusion 5 mg/hr (03/02/2022 1253)   vancomycin Stopped (02/17/22 1800)   PRN Meds:.acetaminophen **OR** acetaminophen, albuterol, bismuth subsalicylate, fentaNYL (SUBLIMAZE) injection, LORazepam, oxyCODONE Medications Prior to Admission:  Prior to Admission medications   Medication Sig Start Date End Date Taking? Authorizing Provider  acetaminophen (TYLENOL) 325 MG tablet Take 2 tablets (650 mg  total) by mouth every 6 (six) hours as needed for mild pain (or Fever >/= 101). 01/19/22  Yes Amery, Gwynneth Albright, MD  atorvastatin (LIPITOR) 40 MG tablet Place 40 mg into feeding tube daily. 11/02/21  Yes [provider]  Bismuth Subsalicylate (PEPTO-BISMOL MAX STRENGTH) 525 MG/15ML SUSP Take 15 mLs by mouth every 6 (six) hours as needed (DIARRHEA).   Yes [provider]  Lactobacillus Rhamnosus, GG, (CULTURELLE) CAPS Take 1 capsule by mouth daily.   Yes [provider]  levothyroxine (SYNTHROID) 75 MCG tablet Take 1 tablet by mouth daily. 03/03/21  Yes [provider]  lisinopril (ZESTRIL) 30  MG tablet Take 30 mg by mouth daily. 12/30/21  Yes [provider]  LORazepam (ATIVAN) 0.5 MG tablet Take 0.5 mg by mouth at bedtime as needed for sleep.   Yes [provider]  melatonin 3 MG TABS tablet Place 3 mg into feeding tube at bedtime. 11/02/21  Yes [provider]  metoprolol tartrate (LOPRESSOR) 25 MG tablet Place 1 tablet (25 mg total) into feeding tube 2 (two) times daily. 11/09/21  Yes Enzo Bi, MD  naloxone [facility]) nasal spray 4 mg/0.1 mL Place 1 spray into the nose once.   Yes [provider]  oxyCODONE (OXY IR/ROXICODONE) 5 MG immediate release tablet Take 5 mg by mouth every 6 (six) hours as needed for severe pain.   Yes [provider]  polyethylene glycol powder (GLYCOLAX/MIRALAX) 17 GM/SCOOP powder Place 17 g into feeding tube. 11/02/21  Yes [provider]  psyllium (REGULOID) 0.52 g capsule Take 0.52 g by mouth in the morning and at bedtime.   Yes [provider]  senna (SENOKOT) 8.6 MG tablet Place 8.6 mg into feeding tube at bedtime. 11/02/21  Yes [provider]  sodium chloride 1 g tablet Take 1 tablet (1 g total) by mouth 2 (two) times daily with a meal. 10/10/21  Yes Wieting, Richard, MD  tiZANidine (ZANAFLEX) 2 MG tablet Take 2 mg by mouth every 8 (eight) hours as needed. 01/08/22 01/08/23 Yes  [provider]  XARELTO 20 MG TABS tablet Take 20 mg by mouth daily. 05/12/21  Yes [provider]   Allergies  Allergen Reactions   Amoxicillin-Pot Clavulanate Diarrhea   Levofloxacin Diarrhea   Buspirone Other (See Comments)   Trazodone Other (See Comments)    nightmares   Review of Systems  Constitutional:  Positive for activity change.  Musculoskeletal:  Positive for back pain.    Physical Exam Pulmonary:     Effort: Pulmonary effort is normal.  Neurological:     Mental Status: She is alert.     Vital Signs: BP 117/72 (BP Location: Left Arm)   Pulse (!) 143   Temp 97.6 F (36.4 C)   Resp (!) 26   Wt 49.5 kg   SpO2 91%   BMI 15.66 kg/m  Pain Scale: 0-10   Pain Score: 6    SpO2: SpO2: 91 % O2 Device:SpO2: 91 % O2 Flow Rate: .   IO: Intake/output summary:  Intake/Output Summary (Last 24 hours) at 02/22/2022 1552 Last data filed at 02/14/2022 7169 Gross per 24 hour  Intake 345.26 ml  Output 525 ml  Net -179.74 ml    LBM: Last BM Date : 02/21/2022 Baseline Weight: Weight: 49.5 kg Most recent weight: Weight: 49.5 kg      Signed by: Asencion Gowda, NP   Please contact Palliative Medicine Team phone at [phone] for questions and concerns.  For individual provider: See Shea Evans

## 2022-02-18 NOTE — Progress Notes (Signed)
Nutrition Brief Note  Chart reviewed. Pt now transitioning to comfort care.  No further nutrition interventions planned at this time.  Please re-consult as needed.   Havana Baldwin W, RD, LDN, CDCES Registered Dietitian II Certified Diabetes Care and Education Specialist Please refer to AMION for RD and/or RD on-call/weekend/after hours pager   

## 2022-02-18 NOTE — Progress Notes (Signed)
   02/17/22 1952  Assess: MEWS Score  Temp 99.1 F (37.3 C)  BP 116/71  MAP (mmHg) 82  Pulse Rate (!) 151  Resp (!) 22  SpO2 94 %  Assess: MEWS Score  MEWS Temp 0  MEWS Systolic 0  MEWS Pulse 3  MEWS RR 1  MEWS LOC 0  MEWS Score 4  MEWS Score Color Red  Assess: if the MEWS score is Yellow or Red  Were vital signs taken at a resting state? Yes  Focused Assessment Change from prior assessment (see assessment flowsheet)  Does the patient meet 2 or more of the SIRS criteria? Yes  Does the patient have a confirmed or suspected source of infection? Yes  Provider and Rapid Response Notified? Yes  MEWS guidelines implemented *See Row Information* No, previously yellow, continue vital signs every 4 hours  Treat  MEWS Interventions Administered scheduled meds/treatments;Administered prn meds/treatments  Pain Scale PAINAD  Faces Pain Scale 2  Pain Type Chronic pain  Pain Location Back  Pain Orientation Mid;Lower  Pain Descriptors / Indicators Aching;Discomfort  Pain Frequency Constant  Pain Onset On-going  Patients Stated Pain Goal 2  Pain Intervention(s) Repositioned  Breathing 1  Negative Vocalization 0  Facial Expression 0  Body Language 0  Consolability 2  PAINAD Score 3  Neuro symptoms relieved by Rest  Patients response to intervention Decreased  Escalate  MEWS: Escalate Red: discuss with charge nurse/RN and provider, consider discussing with RRT  Notify: Charge Nurse/RN  Name of Charge Nurse/RN Notified [place], RN  Date Charge Nurse/RN Notified 02/17/22  Time Charge Nurse/RN Notified 1952  Notify: Provider  Provider Name/Title Jhonnie Garner, NP  Date Provider Notified 02/17/22  Time Provider Notified 1952  Method of Notification Page (Secure chat)  Notification Reason Change in status (Red MEWS)  Provider response No new orders  Date of Provider Response 02/17/22  Time of Provider Response 1952  Document  Patient Outcome Not stable and remains on department   Assess: SIRS CRITERIA  SIRS Temperature  0  SIRS Pulse 1  SIRS Respirations  1  SIRS WBC 1  SIRS Score Sum  3

## 2022-02-18 NOTE — Progress Notes (Signed)
Pt is now comfort care Will sign off

## 2022-02-18 NOTE — Progress Notes (Signed)
Bladder scanned Pt - detected. Transferred Pt to the [facility] in hopes that she would void which she did not. No order to in & out cath. Notified attending MD of this.

## 2022-02-18 NOTE — Progress Notes (Signed)
Patient ID: Kaylee Santiago, female   DOB: [birth year], 87 y.o.   MRN: [id number]  Still awaiting bed placement. Unable to start drip at this time.Paged MD to see if we can treat with IV push.  Lidia Collum, RN

## 2022-02-18 NOTE — Progress Notes (Signed)
PT Cancellation Note  Patient Details Name: Kaylee Santiago MRN: [id number] DOB: Dec 03, 1934   Cancelled Treatment:     Pt did not receive treatment today due to pt's HR is in 160s at rest and is now needs higher level of medical care and is considered step down. Nurse notified to that the pt is not appropriate for therapy today. Therefore pt deferred.  Will attempt to see pt again tomorrow if appropriate.   Janet Berlin PT DPT 12:55 PM,02/20/2022

## 2022-02-18 NOTE — Progress Notes (Signed)
Cross Cover Mrs Kaylee Santiago admitted with atrial fibrillation with RVR transitioned today to comfort care passed away with her family by her side today 2022/03/18 at 11-13-07 PM.  Death verification by me confirmed absence of heart activity, no spontaneous respirations and no neural responses

## 2022-02-18 NOTE — Progress Notes (Signed)
[facility] Cardiology [facility] Encounter Note  Patient: Kaylee Santiago / Admit Date: 02/23/2022 / Date of Encounter: 03/08/2022, 4:55 PM   Subjective: The patient has had significant amount of difficulty with her recent stroke, infection as well as atrial fibrillation with rapid ventricular rate.  It does appear that her rate has been slightly improved with diltiazem drip although after long discussion with the family, nursing staff, and other physicians, they have decided that she wishes to have comfort care at this time.  Therefore further discussion of other interventions including transesophageal echocardiogram will not be pursued at this time     Objective: Telemetry: Atrial fibrillation and rapid ventricular rate Physical Exam: Blood pressure 117/72, pulse (!) 143, temperature 97.6 F (36.4 C), resp. rate (!) 26, weight 49.5 kg, SpO2 91 %. Body mass index is 15.66 kg/m. General: Well developed, well nourished, in no acute distress. Head: Normocephalic, atraumatic, sclera non-icteric, no xanthomas, nares are without discharge. Neck: No apparent masses Lungs: Normal respirations with no wheezes, no rhonchi, no rales , no crackles   Heart: Irregular rate and rhythm, normal S1 S2, no murmur, no rub, no gallop, PMI is normal size and placement, carotid upstroke normal without bruit, jugular venous pressure normal Abdomen: Soft, non-tender, non-distended with normoactive bowel sounds. No hepatosplenomegaly. Abdominal aorta is normal size without bruit Extremities: Trace edema, no clubbing, no cyanosis, no ulcers,  Peripheral: 2+ radial, 2+ femoral, 2+ dorsal pedal pulses Neuro: Poorly alert and oriented. Moves all extremities spontaneously. Psych: Does not responds to questions appropriately with a normal affect.   Intake/Output Summary (Last 24 hours) at 02/10/2022 1655 Last data filed at 03/03/2022 0824 Gross per 24 hour  Intake 345.26 ml  Output 525 ml  Net -179.74 ml    Inpatient  Medications:   Chlorhexidine Gluconate Cloth  6 each Topical Q0600   metoprolol tartrate  2.5 mg Intravenous Q6H   mupirocin ointment  1 Application Nasal BID   psyllium  1 packet Oral BID   saccharomyces boulardii  250 mg Oral Daily   sodium chloride flush  3 mL Intravenous Q12H   Infusions:   diltiazem (CARDIZEM) infusion 5 mg/hr (02/10/2022 1253)    Labs: Recent Labs    02/28/2022 1827 02/17/22 0400 02/17/2022 0344  NA  --  134* 134*  K  --  4.1 3.9  CL  --  100 101  CO2  --  25 21*  GLUCOSE  --  143* 142*  BUN  --  24* 25*  CREATININE  --  0.74 0.73  CALCIUM  --  8.2* 8.0*  MG 2.1  --  2.3   Recent Labs    02/21/2022 1136 02/17/22 0400  AST 77* 67*  ALT 46* 44  ALKPHOS 171* 156*  BILITOT 0.9 1.0  PROT 6.5 5.9*  ALBUMIN 3.0* 2.7*   Recent Labs    02/17/22 0400 02/24/2022 0344  WBC 22.6* 20.3*  HGB 12.0 12.6  HCT 36.1 37.8  MCV 90.9 90.9  PLT 202 169   Recent Labs    03/05/2022 1827 02/24/2022 0344  CKTOTAL 481* 216   Invalid input(s): "POCBNP" Recent Labs    03/08/2022 1827  HGBA1C 6.2*     Weights: Filed Weights   02/17/22 1256  Weight: 49.5 kg     Radiology/Studies:  ECHOCARDIOGRAM COMPLETE  Result Date: 02/17/2022    ECHOCARDIOGRAM REPORT   Patient Name:   Kaylee Santiago Date of Exam: 03/03/2022 Medical Rec #:  [id number]  Height:       70.0 in Accession #:    [id number]   Weight:       116.0 lb Date of Birth:  [birth year]   BSA:          1.656 m Patient Age:    86 years     BP:           155/93 mmHg Patient Gender: F            HR:           121 bpm. Exam Location:  [facility] Procedure: 2D Echo, Cardiac Doppler and Color Doppler Indications:     Elevated Troponin  History:         Patient has prior history of Echocardiogram examinations, most                  recent 09/22/2021. Stroke, Arrythmias:Atrial Fibrillation; Risk                  Factors:Hypertension. Hypothyroidism.  Sonographer:     Daphine Deutscher RDCS Referring Phys:  [id number] RONDELL A SMITH  Diagnosing Phys: Arnoldo Hooker MD IMPRESSIONS  1. Left ventricular ejection fraction, by estimation, is 65 to 70%. The left ventricle has normal function. The left ventricle has no regional wall motion abnormalities. Left ventricular diastolic parameters were normal.  2. Right ventricular systolic function is normal. The right ventricular size is normal.  3. Left atrial size was moderately dilated.  4. Right atrial size was moderately dilated.  5. The mitral valve is normal in structure. Moderate to severe mitral valve regurgitation.  6. Tricuspid valve regurgitation is moderate.  7. The aortic valve is normal in structure. Aortic valve regurgitation is mild. FINDINGS  Left Ventricle: Left ventricular ejection fraction, by estimation, is 65 to 70%. The left ventricle has normal function. The left ventricle has no regional wall motion abnormalities. The left ventricular internal cavity size was small. There is no left ventricular hypertrophy. Left ventricular diastolic parameters were normal. Right Ventricle: The right ventricular size is normal. No increase in right ventricular wall thickness. Right ventricular systolic function is normal. Left Atrium: Left atrial size was moderately dilated. Right Atrium: Right atrial size was moderately dilated. Pericardium: There is no evidence of pericardial effusion. Mitral Valve: The mitral valve is normal in structure. Moderate to severe mitral valve regurgitation. Tricuspid Valve: The tricuspid valve is normal in structure. Tricuspid valve regurgitation is moderate. Aortic Valve: The aortic valve is normal in structure. Aortic valve regurgitation is mild. Pulmonic Valve: The pulmonic valve was normal in structure. Pulmonic valve regurgitation is trivial. Aorta: The aortic root and ascending aorta are structurally normal, with no evidence of dilitation. IAS/Shunts: No atrial level shunt detected by color flow Doppler.  LEFT VENTRICLE PLAX 2D LVIDd:         3.60 cm LVIDs:          2.30 cm LV PW:         1.00 cm LV IVS:        1.00 cm LVOT diam:     1.90 cm LV SV:         28 LV SV Index:   17 LVOT Area:     2.84 cm  RIGHT VENTRICLE RV Basal diam:  2.30 cm RV S prime:     6.06 cm/s TAPSE (M-mode): 1.4 cm LEFT ATRIUM             Index  RIGHT ATRIUM           Index LA diam:        4.30 cm 2.60 cm/m   RA Area:     15.90 cm LA Vol (A2C):   88.2 ml 53.26 ml/m  RA Volume:   39.50 ml  23.85 ml/m LA Vol (A4C):   58.8 ml 35.51 ml/m LA Biplane Vol: 72.5 ml 43.78 ml/m  AORTIC VALVE LVOT Vmax:   57.43 cm/s LVOT Vmean:  45.367 cm/s LVOT VTI:    0.098 m  AORTA Ao Root diam: 3.40 cm Ao Asc diam:  3.10 cm MV E velocity: 80.27 cm/s  TRICUSPID VALVE                            TR Peak grad:   37.5 mmHg                            TR Vmax:        306.00 cm/s                             SHUNTS                            Systemic VTI:  0.10 m                            Systemic Diam: 1.90 cm Arnoldo Hooker MD Electronically signed by Arnoldo Hooker MD Signature Date/Time: 02/17/2022/12:47:40 PM    Final    CT HEAD WO CONTRAST ( )  Result Date: 02/17/2022 CLINICAL DATA:  87 year old female with multiple small acute infarcts in the cerebral and cerebellar hemispheres on MRI yesterday. Subsequent encounter. EXAM: CT HEAD WITHOUT CONTRAST TECHNIQUE: Contiguous axial images were obtained from the base of the skull through the vertex without intravenous contrast. RADIATION DOSE REDUCTION: This exam was performed according to the departmental dose-optimization program which includes automated exposure control, adjustment of the mA and/or kV according to patient size and/or use of iterative reconstruction technique. COMPARISON:  Brain MRI and head CT 02/26/22. FINDINGS: Brain: CT hypodensity is developing encephalomalacia in the right MCA and right MCA/PCA watershed areas on the DWI yesterday. And punctate dystrophic calcification versus intra-axial hemorrhage in the right inferior frontal gyrus appears  unchanged (series 3, image 15 today). Scattered small cortical and subcortical infarcts demonstrated by MRI yesterday remain occult on CT. No other intracranial hemorrhage. No midline shift, mass effect, or evidence of intracranial mass lesion. No ventriculomegaly. Stable gray-white matter differentiation throughout the brain. Vascular: Calcified atherosclerosis at the skull base. No suspicious intracranial vascular hyperdensity. Skull: Stable visualized osseous structures. Sinuses/Orbits: Visualized paranasal sinuses and mastoids are stable and well aerated. Other: No acute orbit or scalp soft tissue finding. IMPRESSION: 1. Multiple small cerebral and cerebellar infarcts demonstrated by MRI yesterday remain occult by CT. And a small focus of hemorrhage versus calcification in the anterior right frontal lobe is unchanged. 2. No intracranial mass effect or new intracranial abnormality. Developing encephalomalacia in the Right MCA and MCA/PCA watershed areas. Electronically Signed   By: Odessa Fleming M.D.   On: 02/17/2022 11:17   MR BRAIN WO CONTRAST  Result Date: 02/26/22 CLINICAL DATA:  Stroke follow-up. EXAM: MRI HEAD WITHOUT CONTRAST TECHNIQUE: Multiplanar, multiecho pulse sequences of the  brain and surrounding structures were obtained without intravenous contrast. COMPARISON:  CT head 02/22/2022 FINDINGS: Brain: Multiple small areas of acute infarct are present. Small acute infarct in the right inferior cerebellum. Small areas of acute infarct in the occipital parietal lobes bilaterally. Small acute infarct in the right head of caudate and external capsule. Microhemorrhage right frontal lobe is mildly hyperdense on CT and was not present on the susceptibility weighted image on MRI from 11/08/2021. Probable acute or subacute small hemorrhage. Chronic hemorrhage right frontal parietal lobe and right parietal lobe. Generalized atrophy without hydrocephalus. Moderate chronic microvascular ischemic change throughout  the white matter. No mass lesion. Vascular: Normal arterial flow voids at the skull base Skull and upper cervical spine: No focal skeletal lesion. Sinuses/Orbits: Paranasal sinuses clear. Bilateral cataract extraction Other: None IMPRESSION: Multiple small areas of acute or subacute infarct compatible with cerebral emboli. Microhemorrhage right frontal lobe is hyperdense on CT and likely is acute or subacute. Atrophy and chronic right MCA infarcts. Chronic microvascular ischemic change throughout the white matter. Electronically Signed   By: Marlan Palau M.D.   On: 03/08/2022 16:33   CT L-SPINE NO CHARGE  Result Date: 02/27/2022 CLINICAL DATA:  Back pain EXAM: CT LUMBAR SPINE WITHOUT CONTRAST TECHNIQUE: Multidetector CT imaging of the lumbar spine was performed without intravenous contrast administration. Multiplanar CT image reconstructions were also generated. RADIATION DOSE REDUCTION: This exam was performed according to the departmental dose-optimization program which includes automated exposure control, adjustment of the mA and/or kV according to patient size and/or use of iterative reconstruction technique. COMPARISON:  X-ray 02/04/2022, MRI 01/12/2022 FINDINGS: Segmentation: 5 lumbar type vertebrae. Alignment: No traumatic listhesis. Progressive retropulsion of the L3 vertebral body compared to the prior MRI. Similar degree of mild retropulsion of the superior endplate of L2. Vertebrae: Acute compression fracture of the T11 vertebral body with approximately 40% vertebral body height loss. No significant retropulsion. No evidence of fracture extension into the posterior elements. Severe compression fracture of the L3 vertebral body with vertebral plana morphology has progressed from the previous MRI and may be minimally progressed from the previous radiograph. Approximately 6 mm of retropulsion of the L3 vertebral body. Acute appearing bilateral nondisplaced L3 transverse process fractures. Progressive  height loss of the L2 vertebral body compression fracture now with approximately 80% height loss. Chronic compression fracture of the L1 vertebral bodies status post cement augmentation. Paraspinal and other soft tissues: See dedicated CT abdomen pelvis for evaluation of the intra-abdominal findings. Disc levels: Multilevel lumbar spondylosis with suspected at least moderate canal stenosis at the L3 level secondary to bony retropulsion. Remaining levels appear similar to the recent lumbar spine MRI. IMPRESSION: 1. Acute compression fracture of the T11 vertebral body with approximately 40% vertebral body height loss. 2. Severe compression fracture of the L3 vertebral body with vertebral plana morphology has progressed from the previous MRI and may be minimally progressed from the previous radiograph. Approximately 6 mm of retropulsion of the L3 vertebral body where there is suspected to be at least moderate canal stenosis. 3. Acute nondisplaced fractures of the bilateral L3 transverse processes. 4. Progressive height loss of the L2 vertebral body compression fracture now with approximately 80% height loss. 5. Chronic compression fracture of the L1 vertebral bodies status post cement augmentation. Electronically Signed   By: Duanne Guess D.O.   On: 02/21/2022 14:17   CT ABDOMEN PELVIS WO CONTRAST  Result Date: 02/22/2022 CLINICAL DATA:  Acute abdominal pain EXAM: CT ABDOMEN AND PELVIS WITHOUT CONTRAST TECHNIQUE:  Multidetector CT imaging of the abdomen and pelvis was performed following the standard protocol without IV contrast. No oral contrast administered. Examination limited by lack of IV/oral contrast and beam hardening artifacts. RADIATION DOSE REDUCTION: This exam was performed according to the departmental dose-optimization program which includes automated exposure control, adjustment of the mA and/or kV according to patient size and/or use of iterative reconstruction technique. COMPARISON:  04/08/2021  FINDINGS: Lower chest: Bibasilar atelectasis with chronic interstitial changes at LEFT lower lobe. Hepatobiliary: Distended gallbladder. Calcified granuloma within liver. No focal hepatic mass evident. Pancreas: Atrophic pancreas, suboptimally seen due to beam hardening artifacts from arms. Spleen: Normal appearance Adrenals/Urinary Tract: Extrarenal pelves bilaterally. No definite renal mass or hydronephrosis. Bladder unremarkable. Stomach/Bowel: Stomach decompressed. Questionable mild rectal wall thickening versus artifact from underdistention. Bowel loops otherwise unremarkable. Vascular/Lymphatic: Atherosclerotic calcifications aorta, iliac arteries, visceral arteries. Enlargement of cardiac silhouette. Small amount of air within RIGHT atrium question related to IV access. Reproductive: Uterus surgically absent Other: No free air or free fluid.  No hernia. Musculoskeletal: Diffuse osseous demineralization. Compression fractures of L1, L2, L3 and T11, with prior spinal augmentation procedure of L1. The marked compression fracture of L3 is new since the prior study, with mild retropulsion of posterior endplate superiorly. Superior endplate fracture of T11 is new. Fractures at L1 and L2 are stable. IMPRESSION: Questionable mild rectal wall thickening versus artifact from underdistention, recommend correlation with digital rectal exam and proctoscopy. New compression fractures of T11 and L3. Bibasilar atelectasis and chronic interstitial changes at LEFT lower lobe. Distended gallbladder. Aortic Atherosclerosis (ICD10-I70.0). Electronically Signed   By: Ulyses Southward M.D.   On: 03/10/2022 14:13   CT HEAD WO CONTRAST ( )  Result Date: 03/10/2022 CLINICAL DATA:  Altered mental status EXAM: CT HEAD WITHOUT CONTRAST TECHNIQUE: Contiguous axial images were obtained from the base of the skull through the vertex without intravenous contrast. RADIATION DOSE REDUCTION: This exam was performed according to the departmental  dose-optimization program which includes automated exposure control, adjustment of the mA and/or kV according to patient size and/or use of iterative reconstruction technique. COMPARISON:  11/07/2021 CT head, correlation is also made with 11/08/2021 MRI head FINDINGS: Brain: Hypodensity in the right posterolateral frontal lobe (series 3, image 23) and right occipital lobe (series 3, image 16), which are new from the prior CT; part of the right posterolateral frontal lobe hypodensity correlates with an area of restricted diffusion on the prior MRI. Tiny areas of hyperdensity in the right frontal lobe (series 3, image 16 are nonspecific and may represent small foci hemorrhage versus calcifications. No mass, mass effect, or midline shift. No hydrocephalus or additional extra-axial collection. Periventricular white matter changes, likely the sequela of chronic small vessel ischemic disease. Vascular: No hyperdense vessel. Skull: Normal. Negative for fracture or focal lesion. Sinuses/Orbits: No acute finding. Status post bilateral lens replacements. Other: The mastoid air cells are well aerated. IMPRESSION: New areas of hypodensity in the right posterolateral frontal lobe and right occipital lobe, age indeterminate but possibly subacute infarcts. In addition there is possible subarachnoid hemorrhage versus calcifications in the right frontal lobe that are new compared to the prior exam. An MRI is recommended for further evaluation. These results were called by telephone at the time of interpretation on 03/06/2022 at 2:08 pm to provider [facility] , who verbally acknowledged these results. Electronically Signed   By: Wiliam Ke M.D.   On: 02/13/2022 14:09   DG Chest Port 1 View  Result Date: 02/25/2022 CLINICAL DATA:  Altered mental status EXAM: PORTABLE CHEST 1 VIEW COMPARISON:  09/21/2021, 03/26/2020 FINDINGS: Stable cardiomediastinal contours. Aortic atherosclerosis. Chronic bibasilar opacities, left greater  than right, likely due to bronchiectasis. Hyperinflated lungs. No new focal consolidation. No pleural effusion or pneumothorax. IMPRESSION: Chronic bibasilar opacities, left greater than right, likely due to bronchiectasis. No new focal consolidation. Electronically Signed   By: Duanne GuessNicholas  Plundo D.O.   On: 02/22/2022 13:19   DG Lumbar Spine 2-3 Views  Result Date: 02/05/2022 CLINICAL DATA:  L3 fracture. EXAM: LUMBAR SPINE - 2-3 VIEW COMPARISON:  January 18, 2022. FINDINGS: Status post kyphoplasty of L1 vertebral body. Old L2 compression fracture is again noted. Stable severe compression deformity of L3 vertebral body consistent with acute to subacute fracture. No spondylolisthesis is noted. Moderate degenerative changes noted at L3-4, L4-5 and L5-S1. IMPRESSION: Stable severe compression deformity of L3 vertebral body is noted consistent with acute to subacute fracture. Status post kyphoplasty of L1 vertebral body. Old L2 compression fracture is again noted. Electronically Signed   By: Lupita RaiderJames  Green Jr M.D.   On: 02/05/2022 12:02     Assessment and Recommendation  87 y.o. female with known paroxysmal nonvalvular atrial fibrillation with rapid ventricular rate likely secondary to acute stroke as well as infection and sepsis with poor long-term prognosis.  Family and the patient have made decisions for hospice care at this time  Signed, Arnoldo HookerBruce Latysha Thackston M.D. FACC

## 2022-02-18 NOTE — Progress Notes (Signed)
Patient ID: Kaylee Santiago, female   DOB: 1935-06-19, 87 y.o.   MRN: [id number]  Patient transferred to room 255. Report given to [place].  Lidia Collum, RN

## 2022-02-18 NOTE — Progress Notes (Signed)
Subjective: Feels slightly better today  Exam: Vitals:   02/25/2022 1016 03/11/2022 1143  BP: 136/86 117/72  Pulse: (!) 160 (!) 143  Resp: (!) 23 (!) 26  Temp: 99.7 F (37.6 C) 97.6 F (36.4 C)  SpO2: 95% 91%   Gen: In bed, NAD Resp: non-labored breathing, no acute distress Abd: soft, nt  Neuro: MS: Awake, slightly less dysarthric than yesterday, able to tell me the year but not month CN: Pupils equal round reactive, she has  n underlying exotropia, but no gaze deficit.  Motor: Her left arm is significantly better today 3/5. Able ot hold legs withou t drift.  Sensory:intact to LT  Repeat head CT is stable  Pertinent Labs: Ldl 39 A1c 6.2  Impression: 87 year old female who presents with multifocal emboli in the setting of atrial fibrillation anticoagulated with Xarelto.  She also has positive Staph aureus in two bottles, and TEE is needed to rule out endocarditis.   Would hold anticoagulation for a week from onset.   I suspect that her mental status is multifactorial, including strokes as well as delirium secondary to bacteremia.  Recommendations: 1) continue ASA 81 mg until 8/16, then can resume anticoagulation 2) continue current lipid management as she is at goal 3) recommend TEE, management of endocarditis per infectious disease if found 4) PT, OT, ST 5) neurology will be available as needed.  Ritta Slot, MD [facility] [phone]  If 7pm- 7am, please page neurology on call as listed in [facility].

## 2022-02-19 LAB — URINE CULTURE: Culture: 80000 — AB

## 2022-02-20 LAB — AEROBIC CULTURE W GRAM STAIN (SUPERFICIAL SPECIMEN)

## 2022-03-12 NOTE — Progress Notes (Signed)
   03/05/2022 0854  Assess: MEWS Score  Temp 100 F (37.8 C)  BP (!) 152/97  MAP (mmHg) 113  Pulse Rate 89  ECG Heart Rate (!) 149  Resp (!) 25  SpO2 95 %  O2 Device Room Air  Assess: MEWS Score  MEWS Temp 0  MEWS Systolic 0  MEWS Pulse 3  MEWS RR 1  MEWS LOC 0  MEWS Score 4  MEWS Score Color Red  Assess: if the MEWS score is Yellow or Red  Were vital signs taken at a resting state? Yes  Focused Assessment No change from prior assessment  Does the patient meet 2 or more of the SIRS criteria? Yes  Does the patient have a confirmed or suspected source of infection? Yes  Provider and Rapid Response Notified?  (Yes provider, no rapid response)  MEWS guidelines implemented *See Row Information* Yes  Treat  MEWS Interventions Administered scheduled meds/treatments  Pain Scale 0-10  Pain Score 6  Pain Type Chronic pain  Pain Location Back  Pain Orientation Mid;Lower  Pain Intervention(s) Medication (See eMAR)  Neuro symptoms relieved by Rest  Patients response to intervention Decreased  Take Vital Signs  Increase Vital Sign Frequency  Red: Q 1hr X 4 then Q 4hr X 4, if remains red, continue Q 4hrs  Escalate  MEWS: Escalate Red: discuss with charge nurse/RN and provider, consider discussing with RRT  Notify: Charge Nurse/RN  Name of Charge Nurse/RN Notified Verl Bangs RN  Date Charge Nurse/RN Notified 02/21/2022  Time Charge Nurse/RN Notified 0830  Notify: Provider  Provider Name/Title Denton Lank DO  Date Provider Notified 03/06/2022  Time Provider Notified 0830  Method of Notification Page  Notification Reason Change in status  Provider response See new orders  Date of Provider Response 03/03/2022  Time of Provider Response 0830  Notify: Rapid Response  Name of Rapid Response RN Notified Instructed not to call  Document  Patient Outcome Transferred/level of care increased  Progress note created (see row info) Yes  Assess: SIRS CRITERIA  SIRS Temperature  0  SIRS Pulse 1   SIRS Respirations  1  SIRS WBC 1  SIRS Score Sum  3   Inserted note for [employer]

## 2022-03-12 NOTE — Progress Notes (Signed)
Patient ID: Kaylee Santiago, female   DOB: [birth year], 87 y.o.   MRN: [id number]   02/23/22 0823  Assess: MEWS Score  Temp 99.4 F (37.4 C)  BP (!) 141/90  MAP (mmHg) 101  Pulse Rate (!) 133  Resp (!) 22  Level of Consciousness Alert  SpO2 95 %  O2 Device Room Air  Assess: MEWS Score  MEWS Temp 0  MEWS Systolic 0  MEWS Pulse 3  MEWS RR 1  MEWS LOC 0  MEWS Score 4  MEWS Score Color Red  Assess: if the MEWS score is Yellow or Red  Were vital signs taken at a resting state? No  Focused Assessment No change from prior assessment  Does the patient meet 2 or more of the SIRS criteria? Yes  Does the patient have a confirmed or suspected source of infection? Yes  Provider and Rapid Response Notified? Yes  MEWS guidelines implemented *See Row Information* No, vital signs rechecked  Treat  MEWS Interventions Administered scheduled meds/treatments  Pain Scale 0-10  Pain Score 6  Pain Type Chronic pain  Pain Location Back  Pain Orientation Mid;Lower  Pain Descriptors / Indicators Aching;Discomfort  Pain Frequency Constant  Pain Onset On-going  Patients Stated Pain Goal 2  Pain Intervention(s) Medication (See eMAR)  Breathing 1  Negative Vocalization 0  Facial Expression 0  Body Language 0  Consolability 2  PAINAD Score 3  Neuro symptoms relieved by Rest  Patients response to intervention Decreased  Take Vital Signs  Increase Vital Sign Frequency  Red: Q 1hr X 4 then Q 4hr X 4, if remains red, continue Q 4hrs  Escalate  MEWS: Escalate Red: discuss with charge nurse/RN and provider, consider discussing with RRT  Notify: Charge Nurse/RN  Name of Charge Nurse/RN Notified Kaylee Santiago  Date Charge Nurse/RN Notified 02-23-22  Time Charge Nurse/RN Notified 0830  Notify: Provider  Provider Name/Title Kaylee Lank DO  Date Provider Notified 23-Feb-2022  Time Provider Notified [phone]  Method of Notification Page  Notification Reason Change in status  Provider response See new orders  Date  of Provider Response 02-23-2022  Time of Provider Response [phone]  Notify: Rapid Response  Name of Rapid Response RN Notified Instructed not to call Rapid by DO  Document  Patient Outcome Transferred/level of care increased  Progress note created (see row info) Yes  Assess: SIRS CRITERIA  SIRS Temperature  0  SIRS Pulse 1  SIRS Respirations  1  SIRS WBC 1  SIRS Score Sum  3    MD initiated transfer and stated no need fpr rapid response since she had already been in a red and yellow mews for two days consecutively. Awaiting bed placement  Lidia Collum, RN .

## 2022-03-12 NOTE — Death Summary Note (Signed)
DEATH SUMMARY   Patient Details  Name: Kaylee Santiago MRN: [id number] DOB: 12/02/1934 WKG:[id number], Doctors Making Admission/Discharge Information   Admit Date:  03-11-22  Date of Death: Date of Death: 2022/03/13  Time of Death: Time of Death: October 06, 2107  Length of Stay: 3   Principle Cause of death: MRSA bacteremia complicated by A-fib RVR with hypotension  Hospital Diagnoses: Principal Problem:   Severe sepsis ([place]) Active Problems:   Elevated troponin   Acute CVA (cerebrovascular accident) ([place])   Acute encephalopathy   Atrial fibrillation, persistent (HCC)   Chronic anticoagulation   Intractable back pain   Closed compression fracture of L3 lumbar vertebra, with routine healing, subsequent encounter   Compression fracture of T11 vertebra ([place])   Hypertension   Hyponatremia   Bronchiectasis without complication (HCC)   Hypothyroidism   Protein-calorie malnutrition, severe   Dysphagia due to recent cerebrovascular accident   Pressure injury of skin   Transaminitis   MRSA bacteremia   Hyperlipidemia   Hospital Course:  Kaylee Santiago is a 87 y.o. female with medical history significant of hypertension, atrial fibrillation on Xarelto, CVA in 10/2021,  bronchiectasis,hypothyroidism, and recent lumbar compression fractures who presented from [facility] on 03/11/22 with altered mental status and severe low back pain with inability to move her legs.  See H&P for remainder of HPI on admission.  In the ED, patient met SIRS criteria with fever and leukocytosis.  Blood cultures subsequently positive for MRSA.  Patient on IV vancomycin.  Evaluation for infectious source is underway.  ID is following.  Neuroimaging revealed multiple new acute versus subacute embolic CVAs involving right posterior lateral frontal lobe and right occipital lobe, microhemorrhage noted in the right frontal lobe.  For back pain, CT lumbar spine showed acute compression fracture of T11 and progression of L3  compression fracture with 6 mm retropulsion with suspected moderate canal stenosis.    Consultants:  Neurology Neurosurgery Infectious disease Cardiology Palliative Care  Significant events: 8/9: A-fib RVR, still on permissive HTN, low dose IV metop. 8/10: Persistent A-fib RVR. Transfer to progressive. Cardizem drip.  Palliative care consult on 14-Mar-2023, patient decided on comfort measures.  Patient had ongoing rapid A-fib and secondary hypotension and subsequently passed away.  Assessment and Plan: * Severe sepsis ([place]) Present on admission.  Due to MRSA bacteremia.  Patient met SIRS criteria on admission with fever, leukocytosis and elevated lactic acid consistent with endorgan dysfunction.  Blood cultures positive for MRSA. -- Management as outlined for MRSA bacteremia  Elevated troponin Most likely demand ischemia in the setting of sepsis and A-fib RVR.  No other evidence of ACS. -- Appreciate cardiology recommendations -- Stat EKG and troponin if patient develops chest pain  Acute encephalopathy Most likely multifactorial due to acute embolic strokes and infection.   8/10: Mental status improving, more alert & interactive today. -- Manage underlying conditions as outlined -- Delirium precautions -- Neurochecks  Acute CVA (cerebrovascular accident) [facility]) Patient presented with altered mental status/encephalopathy.  Noncontrast head CT showed new areas of hypodensity in the right posterior lateral frontal lobe and right occipital lobe concerning for subacute infarct.  MRI confirmed multiple areas of acute versus subacute infarcts compatible with cerebral emboli with microhemorrhage noted in the right frontal lobe. -- Neurology following -- Continue neurochecks -- Hold Xarelto -- Permissive hypertension -- Follow-up PT OT SLP recommendations --Continue aspirin -- Risk factor modification  Chronic anticoagulation Patient takes Xarelto for A-fib, on hold due to microhemorrhage  seen in the  right frontal lobe on MRI.  Atrial fibrillation, persistent (HCC) Currently in A-fib RVR secondary to metoprolol being held for permissive hypertension in setting of acute CVA. 8/10: HR's this AM 140's to 150's --Discussed with neurology re permissive HTN, okay with Cardizem drip --Transfer to Progressive --Cardizem load & drip 5-15 mg/hr --Home metoprolol on hold --IV metoprolol 2.5 mg PRN HR > 120 despite drip --Telemetry --Cardiology following, appreciate recommendations --Hold Xarelto x 1 week from symptom onset per neurology  Compression fracture of T11 vertebra (HCC) See intractable back pain  Closed compression fracture of L3 lumbar vertebra, with routine healing, subsequent encounter See intractable back pain  Intractable back pain Secondary to acute compression fracture of T11, recent compression fractures of L1, L2, L3, L5, seen by neurosurgery on 7/27.  Conservative management was recommended including TLSO/LSO brace. -- Brace when out of bed -- Pain management per orders -- PT OT -- Neurosurgery recommends upright lumbar x-rays when patient able to tolerate.  No plan for neurosurgical intervention at this time.  Hypertension Antihypertensives held on admission in the setting of acute strokes, allowing for permissive hypertension. 8/10: Now on Cardizem gtt for rapid A-fib  Hyponatremia Mild, sodium on admission 134 appears chronic.  Most likely hypovolemic given elevated BUN/Cr ratio, elevated CK, poor skin turgor on exam noted.   -- Continue IV hydration until adequate p.o. intake -- Monitor BMP  Bronchiectasis without complication (HCC) Stable without exacerbation at this time. -- Incentive spirometry -- As needed bronchodilators, mucolytics  Hypothyroidism Continue levothyroxine  Hyperlipidemia Continue statin  MRSA bacteremia Blood cultures from admission positive for MRSA.  Unclear source. -- Infectious disease consulted -- Continue  vancomycin -- TTE pending, likely needs TEE -- Monitor fever curve, CBC, trend Pro-Cal -- Otherwise management as outlined   Transaminitis Most likely due to sepsis.  Monitor LFTs.  Pressure injury of skin POA.  I agree with the wound description as outlined below. -- Frequent repositioning -- Diligent wound care -- Monitor closely for signs of infection  Pressure Injury 03/04/2022 Sacrum Left;Right;Mid Stage 2 -  Partial thickness loss of dermis presenting as a shallow open injury with a red, pink wound bed without slough. (Active)  02/27/2022 1750  Location: Sacrum  Location Orientation: Left;Right;Mid  Staging: Stage 2 -  Partial thickness loss of dermis presenting as a shallow open injury with a red, pink wound bed without slough.  Wound Description (Comments):   Present on Admission: Yes      Dysphagia due to recent cerebrovascular accident Pt had PEG tube after prior stroke, since removed per patient's request.  Pt states would not want any artificial nutrition going forward. --SLP following --Aspiration precautions --Currently on pureed diet, nectar thick liquids --Gentle maintenance IV hydration  Protein-calorie malnutrition, severe Related to chronic illnesses (COPD, pulmonary fibrosis, multiple CVAs, dysphagia), as evidenced by severe fat and muscle depletion. --Appreciate dietitian recommendations -- Started on Nepro shakes, Magic cups, vitamin C and multivitamin -- Monitor and encourage p.o. intake -- Patient previously had PEG tube after prior stroke which was removed at her request and patient has stated she would not want any feeding tube or artificial nutrition  Elevated CK-resolved as of 03/05/2022 CK 481 consistent with mild rhabdomyolysis. Resolved with IV hydration.          Procedures: none  Consultations: Palliative care, Cardiology, Neurology  The results of significant diagnostics from this hospitalization (including imaging, microbiology,  ancillary and laboratory) are listed below for reference.   Significant Diagnostic Studies: ECHOCARDIOGRAM  COMPLETE  Result Date: 02/17/2022    ECHOCARDIOGRAM REPORT   Patient Name:   Kaylee Santiago Date of Exam: 03/02/2022 Medical Rec #:  [id number]    Height:       70.0 in Accession #:    [id number]   Weight:       116.0 lb Date of Birth:  Dec 08, 1934   BSA:          1.656 m Patient Age:    64 years     BP:           155/93 mmHg Patient Gender: F            HR:           121 bpm. Exam Location:  [facility] Procedure: 2D Echo, Cardiac Doppler and Color Doppler Indications:     Elevated Troponin  History:         Patient has prior history of Echocardiogram examinations, most                  recent 09/22/2021. Stroke, Arrythmias:Atrial Fibrillation; Risk                  Factors:Hypertension. Hypothyroidism.  Sonographer:     Cresenciano Lick RDCS Referring Phys:  [id number] [place] Diagnosing Phys: Serafina Royals MD IMPRESSIONS  1. Left ventricular ejection fraction, by estimation, is 65 to 70%. The left ventricle has normal function. The left ventricle has no regional wall motion abnormalities. Left ventricular diastolic parameters were normal.  2. Right ventricular systolic function is normal. The right ventricular size is normal.  3. Left atrial size was moderately dilated.  4. Right atrial size was moderately dilated.  5. The mitral valve is normal in structure. Moderate to severe mitral valve regurgitation.  6. Tricuspid valve regurgitation is moderate.  7. The aortic valve is normal in structure. Aortic valve regurgitation is mild. FINDINGS  Left Ventricle: Left ventricular ejection fraction, by estimation, is 65 to 70%. The left ventricle has normal function. The left ventricle has no regional wall motion abnormalities. The left ventricular internal cavity size was small. There is no left ventricular hypertrophy. Left ventricular diastolic parameters were normal. Right Ventricle: The right ventricular  size is normal. No increase in right ventricular wall thickness. Right ventricular systolic function is normal. Left Atrium: Left atrial size was moderately dilated. Right Atrium: Right atrial size was moderately dilated. Pericardium: There is no evidence of pericardial effusion. Mitral Valve: The mitral valve is normal in structure. Moderate to severe mitral valve regurgitation. Tricuspid Valve: The tricuspid valve is normal in structure. Tricuspid valve regurgitation is moderate. Aortic Valve: The aortic valve is normal in structure. Aortic valve regurgitation is mild. Pulmonic Valve: The pulmonic valve was normal in structure. Pulmonic valve regurgitation is trivial. Aorta: The aortic root and ascending aorta are structurally normal, with no evidence of dilitation. IAS/Shunts: No atrial level shunt detected by color flow Doppler.  LEFT VENTRICLE PLAX 2D LVIDd:         3.60 cm LVIDs:         2.30 cm LV PW:         1.00 cm LV IVS:        1.00 cm LVOT diam:     1.90 cm LV SV:         28 LV SV Index:   17 LVOT Area:     2.84 cm  RIGHT VENTRICLE RV Basal diam:  2.30 cm RV S prime:  6.06 cm/s TAPSE (M-mode): 1.4 cm LEFT ATRIUM             Index        RIGHT ATRIUM           Index LA diam:        4.30 cm 2.60 cm/m   RA Area:     15.90 cm LA Vol (A2C):   88.2 ml 53.26 ml/m  RA Volume:   39.50 ml  23.85 ml/m LA Vol (A4C):   58.8 ml 35.51 ml/m LA Biplane Vol: 72.5 ml 43.78 ml/m  AORTIC VALVE LVOT Vmax:   57.43 cm/s LVOT Vmean:  45.367 cm/s LVOT VTI:    0.098 m  AORTA Ao Root diam: 3.40 cm Ao Asc diam:  3.10 cm MV E velocity: 80.27 cm/s  TRICUSPID VALVE                            TR Peak grad:   37.5 mmHg                            TR Vmax:        306.00 cm/s                             SHUNTS                            Systemic VTI:  0.10 m                            Systemic Diam: 1.90 cm Serafina Royals MD Electronically signed by Serafina Royals MD Signature Date/Time: 02/17/2022/12:47:40 PM    Final    CT HEAD  WO CONTRAST (5MM)  Result Date: 02/17/2022 CLINICAL DATA:  87 year old female with multiple small acute infarcts in the cerebral and cerebellar hemispheres on MRI yesterday. Subsequent encounter. EXAM: CT HEAD WITHOUT CONTRAST TECHNIQUE: Contiguous axial images were obtained from the base of the skull through the vertex without intravenous contrast. RADIATION DOSE REDUCTION: This exam was performed according to the departmental dose-optimization program which includes automated exposure control, adjustment of the mA and/or kV according to patient size and/or use of iterative reconstruction technique. COMPARISON:  Brain MRI and head CT 02/23/2022. FINDINGS: Brain: CT hypodensity is developing encephalomalacia in the right MCA and right MCA/PCA watershed areas on the DWI yesterday. And punctate dystrophic calcification versus intra-axial hemorrhage in the right inferior frontal gyrus appears unchanged (series 3, image 15 today). Scattered small cortical and subcortical infarcts demonstrated by MRI yesterday remain occult on CT. No other intracranial hemorrhage. No midline shift, mass effect, or evidence of intracranial mass lesion. No ventriculomegaly. Stable gray-white matter differentiation throughout the brain. Vascular: Calcified atherosclerosis at the skull base. No suspicious intracranial vascular hyperdensity. Skull: Stable visualized osseous structures. Sinuses/Orbits: Visualized paranasal sinuses and mastoids are stable and well aerated. Other: No acute orbit or scalp soft tissue finding. IMPRESSION: 1. Multiple small cerebral and cerebellar infarcts demonstrated by MRI yesterday remain occult by CT. And a small focus of hemorrhage versus calcification in the anterior right frontal lobe is unchanged. 2. No intracranial mass effect or new intracranial abnormality. Developing encephalomalacia in the Right MCA and MCA/PCA watershed areas. Electronically Signed   By: Genevie Ann M.D.   On: 02/17/2022  11:17   MR  BRAIN WO CONTRAST  Result Date: 02/21/2022 CLINICAL DATA:  Stroke follow-up. EXAM: MRI HEAD WITHOUT CONTRAST TECHNIQUE: Multiplanar, multiecho pulse sequences of the brain and surrounding structures were obtained without intravenous contrast. COMPARISON:  CT head 02/11/2022 FINDINGS: Brain: Multiple small areas of acute infarct are present. Small acute infarct in the right inferior cerebellum. Small areas of acute infarct in the occipital parietal lobes bilaterally. Small acute infarct in the right head of caudate and external capsule. Microhemorrhage right frontal lobe is mildly hyperdense on CT and was not present on the susceptibility weighted image on MRI from 11/08/2021. Probable acute or subacute small hemorrhage. Chronic hemorrhage right frontal parietal lobe and right parietal lobe. Generalized atrophy without hydrocephalus. Moderate chronic microvascular ischemic change throughout the white matter. No mass lesion. Vascular: Normal arterial flow voids at the skull base Skull and upper cervical spine: No focal skeletal lesion. Sinuses/Orbits: Paranasal sinuses clear. Bilateral cataract extraction Other: None IMPRESSION: Multiple small areas of acute or subacute infarct compatible with cerebral emboli. Microhemorrhage right frontal lobe is hyperdense on CT and likely is acute or subacute. Atrophy and chronic right MCA infarcts. Chronic microvascular ischemic change throughout the white matter. Electronically Signed   By: Franchot Gallo M.D.   On: 03/02/2022 16:33   CT L-SPINE NO CHARGE  Result Date: 02/24/2022 CLINICAL DATA:  Back pain EXAM: CT LUMBAR SPINE WITHOUT CONTRAST TECHNIQUE: Multidetector CT imaging of the lumbar spine was performed without intravenous contrast administration. Multiplanar CT image reconstructions were also generated. RADIATION DOSE REDUCTION: This exam was performed according to the departmental dose-optimization program which includes automated exposure control, adjustment of  the mA and/or kV according to patient size and/or use of iterative reconstruction technique. COMPARISON:  X-ray 02/04/2022, MRI 01/12/2022 FINDINGS: Segmentation: 5 lumbar type vertebrae. Alignment: No traumatic listhesis. Progressive retropulsion of the L3 vertebral body compared to the prior MRI. Similar degree of mild retropulsion of the superior endplate of L2. Vertebrae: Acute compression fracture of the T11 vertebral body with approximately 40% vertebral body height loss. No significant retropulsion. No evidence of fracture extension into the posterior elements. Severe compression fracture of the L3 vertebral body with vertebral plana morphology has progressed from the previous MRI and may be minimally progressed from the previous radiograph. Approximately 6 mm of retropulsion of the L3 vertebral body. Acute appearing bilateral nondisplaced L3 transverse process fractures. Progressive height loss of the L2 vertebral body compression fracture now with approximately 80% height loss. Chronic compression fracture of the L1 vertebral bodies status post cement augmentation. Paraspinal and other soft tissues: See dedicated CT abdomen pelvis for evaluation of the intra-abdominal findings. Disc levels: Multilevel lumbar spondylosis with suspected at least moderate canal stenosis at the L3 level secondary to bony retropulsion. Remaining levels appear similar to the recent lumbar spine MRI. IMPRESSION: 1. Acute compression fracture of the T11 vertebral body with approximately 40% vertebral body height loss. 2. Severe compression fracture of the L3 vertebral body with vertebral plana morphology has progressed from the previous MRI and may be minimally progressed from the previous radiograph. Approximately 6 mm of retropulsion of the L3 vertebral body where there is suspected to be at least moderate canal stenosis. 3. Acute nondisplaced fractures of the bilateral L3 transverse processes. 4. Progressive height loss of the  L2 vertebral body compression fracture now with approximately 80% height loss. 5. Chronic compression fracture of the L1 vertebral bodies status post cement augmentation. Electronically Signed   By: Davina Poke D.O.  On: 03/10/2022 14:17   CT ABDOMEN PELVIS WO CONTRAST  Result Date: 02/24/2022 CLINICAL DATA:  Acute abdominal pain EXAM: CT ABDOMEN AND PELVIS WITHOUT CONTRAST TECHNIQUE: Multidetector CT imaging of the abdomen and pelvis was performed following the standard protocol without IV contrast. No oral contrast administered. Examination limited by lack of IV/oral contrast and beam hardening artifacts. RADIATION DOSE REDUCTION: This exam was performed according to the departmental dose-optimization program which includes automated exposure control, adjustment of the mA and/or kV according to patient size and/or use of iterative reconstruction technique. COMPARISON:  04/08/2021 FINDINGS: Lower chest: Bibasilar atelectasis with chronic interstitial changes at LEFT lower lobe. Hepatobiliary: Distended gallbladder. Calcified granuloma within liver. No focal hepatic mass evident. Pancreas: Atrophic pancreas, suboptimally seen due to beam hardening artifacts from arms. Spleen: Normal appearance Adrenals/Urinary Tract: Extrarenal pelves bilaterally. No definite renal mass or hydronephrosis. Bladder unremarkable. Stomach/Bowel: Stomach decompressed. Questionable mild rectal wall thickening versus artifact from underdistention. Bowel loops otherwise unremarkable. Vascular/Lymphatic: Atherosclerotic calcifications aorta, iliac arteries, visceral arteries. Enlargement of cardiac silhouette. Small amount of air within RIGHT atrium question related to IV access. Reproductive: Uterus surgically absent Other: No free air or free fluid.  No hernia. Musculoskeletal: Diffuse osseous demineralization. Compression fractures of L1, L2, L3 and T11, with prior spinal augmentation procedure of L1. The marked compression  fracture of L3 is new since the prior study, with mild retropulsion of posterior endplate superiorly. Superior endplate fracture of Y40 is new. Fractures at L1 and L2 are stable. IMPRESSION: Questionable mild rectal wall thickening versus artifact from underdistention, recommend correlation with digital rectal exam and proctoscopy. New compression fractures of T11 and L3. Bibasilar atelectasis and chronic interstitial changes at LEFT lower lobe. Distended gallbladder. Aortic Atherosclerosis (ICD10-I70.0). Electronically Signed   By: Lavonia Dana M.D.   On: 02/10/2022 14:13   CT HEAD WO CONTRAST (5MM)  Result Date: 02/15/2022 CLINICAL DATA:  Altered mental status EXAM: CT HEAD WITHOUT CONTRAST TECHNIQUE: Contiguous axial images were obtained from the base of the skull through the vertex without intravenous contrast. RADIATION DOSE REDUCTION: This exam was performed according to the departmental dose-optimization program which includes automated exposure control, adjustment of the mA and/or kV according to patient size and/or use of iterative reconstruction technique. COMPARISON:  11/07/2021 CT head, correlation is also made with 11/08/2021 MRI head FINDINGS: Brain: Hypodensity in the right posterolateral frontal lobe (series 3, image 23) and right occipital lobe (series 3, image 16), which are new from the prior CT; part of the right posterolateral frontal lobe hypodensity correlates with an area of restricted diffusion on the prior MRI. Tiny areas of hyperdensity in the right frontal lobe (series 3, image 16 are nonspecific and may represent small foci hemorrhage versus calcifications. No mass, mass effect, or midline shift. No hydrocephalus or additional extra-axial collection. Periventricular white matter changes, likely the sequela of chronic small vessel ischemic disease. Vascular: No hyperdense vessel. Skull: Normal. Negative for fracture or focal lesion. Sinuses/Orbits: No acute finding. Status post  bilateral lens replacements. Other: The mastoid air cells are well aerated. IMPRESSION: New areas of hypodensity in the right posterolateral frontal lobe and right occipital lobe, age indeterminate but possibly subacute infarcts. In addition there is possible subarachnoid hemorrhage versus calcifications in the right frontal lobe that are new compared to the prior exam. An MRI is recommended for further evaluation. These results were called by telephone at the time of interpretation on 03/05/2022 at 2:08 pm to provider [facility] , who verbally acknowledged these results.  Electronically Signed   By: Merilyn Baba M.D.   On: 02/21/2022 14:09   DG Chest Port 1 View  Result Date: 02/10/2022 CLINICAL DATA:  Altered mental status EXAM: PORTABLE CHEST 1 VIEW COMPARISON:  09/21/2021, 03/26/2020 FINDINGS: Stable cardiomediastinal contours. Aortic atherosclerosis. Chronic bibasilar opacities, left greater than right, likely due to bronchiectasis. Hyperinflated lungs. No new focal consolidation. No pleural effusion or pneumothorax. IMPRESSION: Chronic bibasilar opacities, left greater than right, likely due to bronchiectasis. No new focal consolidation. Electronically Signed   By: Davina Poke D.O.   On: 02/15/2022 13:19   DG Lumbar Spine 2-3 Views  Result Date: 02/05/2022 CLINICAL DATA:  L3 fracture. EXAM: LUMBAR SPINE - 2-3 VIEW COMPARISON:  January 18, 2022. FINDINGS: Status post kyphoplasty of L1 vertebral body. Old L2 compression fracture is again noted. Stable severe compression deformity of L3 vertebral body consistent with acute to subacute fracture. No spondylolisthesis is noted. Moderate degenerative changes noted at L3-4, L4-5 and L5-S1. IMPRESSION: Stable severe compression deformity of L3 vertebral body is noted consistent with acute to subacute fracture. Status post kyphoplasty of L1 vertebral body. Old L2 compression fracture is again noted. Electronically Signed   By: Marijo Conception M.D.   On:  02/05/2022 12:02    Microbiology: No results found for this or any previous visit (from the past 240 hour(s)).   Time spent: 20 minutes  Signed: Ezekiel Slocumb, DO March 21, 2022

## 2022-03-12 DEATH — deceased

## 2022-04-06 ENCOUNTER — Ambulatory Visit: Payer: Medicare Other | Admitting: Student in an Organized Health Care Education/Training Program
